# Patient Record
Sex: Female | Born: 1935 | Race: White | Hispanic: No | Marital: Married | Smoking: Never smoker
Health system: Southern US, Community
[De-identification: ages and names within clinical notes are randomized; demographics above are authoritative.]

## PROBLEM LIST (undated history)

## (undated) DIAGNOSIS — Z87442 Personal history of urinary calculi: Secondary | ICD-10-CM

## (undated) DIAGNOSIS — R600 Localized edema: Secondary | ICD-10-CM

## (undated) DIAGNOSIS — C9590 Leukemia, unspecified not having achieved remission: Secondary | ICD-10-CM

## (undated) DIAGNOSIS — E782 Mixed hyperlipidemia: Secondary | ICD-10-CM

## (undated) DIAGNOSIS — M1990 Unspecified osteoarthritis, unspecified site: Secondary | ICD-10-CM

## (undated) DIAGNOSIS — Z853 Personal history of malignant neoplasm of breast: Secondary | ICD-10-CM

## (undated) DIAGNOSIS — I10 Essential (primary) hypertension: Secondary | ICD-10-CM

## (undated) DIAGNOSIS — D469 Myelodysplastic syndrome, unspecified: Secondary | ICD-10-CM

## (undated) DIAGNOSIS — C50919 Malignant neoplasm of unspecified site of unspecified female breast: Secondary | ICD-10-CM

## (undated) DIAGNOSIS — D72829 Elevated white blood cell count, unspecified: Secondary | ICD-10-CM

## (undated) DIAGNOSIS — Z923 Personal history of irradiation: Secondary | ICD-10-CM

## (undated) DIAGNOSIS — N200 Calculus of kidney: Secondary | ICD-10-CM

## (undated) DIAGNOSIS — D649 Anemia, unspecified: Secondary | ICD-10-CM

## (undated) DIAGNOSIS — E559 Vitamin D deficiency, unspecified: Secondary | ICD-10-CM

## (undated) HISTORY — DX: Localized edema: R60.0

## (undated) HISTORY — DX: Personal history of urinary calculi: Z87.442

## (undated) HISTORY — DX: Essential (primary) hypertension: I10

## (undated) HISTORY — DX: Vitamin D deficiency, unspecified: E55.9

## (undated) HISTORY — DX: Malignant neoplasm of unspecified site of unspecified female breast: C50.919

## (undated) HISTORY — DX: Mixed hyperlipidemia: E78.2

## (undated) HISTORY — DX: Elevated white blood cell count, unspecified: D72.829

## (undated) HISTORY — PX: KIDNEY STONE SURGERY: SHX686

## (undated) HISTORY — DX: Personal history of malignant neoplasm of breast: Z85.3

---

## 2001-06-12 DIAGNOSIS — C50919 Malignant neoplasm of unspecified site of unspecified female breast: Secondary | ICD-10-CM

## 2001-06-12 DIAGNOSIS — Z923 Personal history of irradiation: Secondary | ICD-10-CM

## 2001-06-12 DIAGNOSIS — Z853 Personal history of malignant neoplasm of breast: Secondary | ICD-10-CM

## 2001-06-12 HISTORY — DX: Personal history of malignant neoplasm of breast: Z85.3

## 2001-06-12 HISTORY — PX: BREAST BIOPSY: SHX20

## 2001-06-12 HISTORY — PX: BREAST LUMPECTOMY: SHX2

## 2001-06-12 HISTORY — DX: Personal history of irradiation: Z92.3

## 2001-06-12 HISTORY — DX: Malignant neoplasm of unspecified site of unspecified female breast: C50.919

## 2008-12-06 DIAGNOSIS — N200 Calculus of kidney: Secondary | ICD-10-CM | POA: Insufficient documentation

## 2015-02-22 DIAGNOSIS — E782 Mixed hyperlipidemia: Secondary | ICD-10-CM | POA: Insufficient documentation

## 2015-02-22 DIAGNOSIS — Z87442 Personal history of urinary calculi: Secondary | ICD-10-CM | POA: Insufficient documentation

## 2015-02-22 DIAGNOSIS — I10 Essential (primary) hypertension: Secondary | ICD-10-CM | POA: Insufficient documentation

## 2015-02-22 DIAGNOSIS — E559 Vitamin D deficiency, unspecified: Secondary | ICD-10-CM | POA: Insufficient documentation

## 2015-02-22 DIAGNOSIS — Z853 Personal history of malignant neoplasm of breast: Secondary | ICD-10-CM | POA: Insufficient documentation

## 2015-11-29 ENCOUNTER — Other Ambulatory Visit: Payer: Self-pay | Admitting: Internal Medicine

## 2015-11-29 DIAGNOSIS — Z1239 Encounter for other screening for malignant neoplasm of breast: Secondary | ICD-10-CM

## 2015-12-07 ENCOUNTER — Inpatient Hospital Stay
Admission: RE | Admit: 2015-12-07 | Discharge: 2015-12-07 | Disposition: A | Discharge status: Home / Self Care | Payer: Self-pay | Source: Ambulatory Visit | Attending: *Deleted | Admitting: *Deleted

## 2015-12-07 ENCOUNTER — Other Ambulatory Visit: Payer: Self-pay | Admitting: *Deleted

## 2015-12-07 DIAGNOSIS — Z9289 Personal history of other medical treatment: Secondary | ICD-10-CM

## 2015-12-17 ENCOUNTER — Ambulatory Visit
Admission: RE | Admit: 2015-12-17 | Discharge: 2015-12-17 | Disposition: A | Discharge status: Home / Self Care | Payer: Medicare Other | Source: Ambulatory Visit | Attending: Internal Medicine | Admitting: Internal Medicine

## 2015-12-17 ENCOUNTER — Other Ambulatory Visit: Payer: Self-pay | Admitting: Internal Medicine

## 2015-12-17 DIAGNOSIS — Z1231 Encounter for screening mammogram for malignant neoplasm of breast: Secondary | ICD-10-CM | POA: Diagnosis present

## 2015-12-17 DIAGNOSIS — Z1239 Encounter for other screening for malignant neoplasm of breast: Secondary | ICD-10-CM

## 2016-02-22 ENCOUNTER — Inpatient Hospital Stay: Payer: Medicare Other | Attending: Internal Medicine | Admitting: Internal Medicine

## 2016-02-22 ENCOUNTER — Telehealth: Payer: Self-pay | Admitting: *Deleted

## 2016-02-22 ENCOUNTER — Inpatient Hospital Stay: Payer: Medicare Other

## 2016-02-22 ENCOUNTER — Encounter: Payer: Self-pay | Admitting: *Deleted

## 2016-02-22 ENCOUNTER — Other Ambulatory Visit: Payer: Self-pay | Admitting: *Deleted

## 2016-02-22 DIAGNOSIS — Z853 Personal history of malignant neoplasm of breast: Secondary | ICD-10-CM | POA: Diagnosis not present

## 2016-02-22 DIAGNOSIS — D72829 Elevated white blood cell count, unspecified: Secondary | ICD-10-CM | POA: Diagnosis not present

## 2016-02-22 DIAGNOSIS — R161 Splenomegaly, not elsewhere classified: Secondary | ICD-10-CM

## 2016-02-22 DIAGNOSIS — Z79899 Other long term (current) drug therapy: Secondary | ICD-10-CM | POA: Diagnosis not present

## 2016-02-22 DIAGNOSIS — F1010 Alcohol abuse, uncomplicated: Secondary | ICD-10-CM | POA: Diagnosis not present

## 2016-02-22 DIAGNOSIS — Z87442 Personal history of urinary calculi: Secondary | ICD-10-CM | POA: Diagnosis not present

## 2016-02-22 DIAGNOSIS — E782 Mixed hyperlipidemia: Secondary | ICD-10-CM | POA: Insufficient documentation

## 2016-02-22 DIAGNOSIS — E559 Vitamin D deficiency, unspecified: Secondary | ICD-10-CM | POA: Diagnosis not present

## 2016-02-22 DIAGNOSIS — I10 Essential (primary) hypertension: Secondary | ICD-10-CM | POA: Diagnosis not present

## 2016-02-22 DIAGNOSIS — Z923 Personal history of irradiation: Secondary | ICD-10-CM | POA: Diagnosis not present

## 2016-02-22 LAB — COMPREHENSIVE METABOLIC PANEL
ALT: 19 U/L (ref 14–54)
ANION GAP: 8 (ref 5–15)
AST: 28 U/L (ref 15–41)
Albumin: 4.5 g/dL (ref 3.5–5.0)
Alkaline Phosphatase: 56 U/L (ref 38–126)
BILIRUBIN TOTAL: 0.7 mg/dL (ref 0.3–1.2)
BUN: 16 mg/dL (ref 6–20)
CHLORIDE: 96 mmol/L — AB (ref 101–111)
CO2: 32 mmol/L (ref 22–32)
Calcium: 9.7 mg/dL (ref 8.9–10.3)
Creatinine, Ser: 0.68 mg/dL (ref 0.44–1.00)
Glucose, Bld: 108 mg/dL — ABNORMAL HIGH (ref 65–99)
POTASSIUM: 3.9 mmol/L (ref 3.5–5.1)
Sodium: 136 mmol/L (ref 135–145)
TOTAL PROTEIN: 7.3 g/dL (ref 6.5–8.1)

## 2016-02-22 LAB — CBC WITH DIFFERENTIAL/PLATELET
Basophils Absolute: 0.4 10*3/uL — ABNORMAL HIGH (ref 0–0.1)
Basophils Relative: 1 %
EOS ABS: 0.1 10*3/uL (ref 0–0.7)
HCT: 37.4 % (ref 35.0–47.0)
Hemoglobin: 11.9 g/dL — ABNORMAL LOW (ref 12.0–16.0)
LYMPHS ABS: 4.4 10*3/uL — AB (ref 1.0–3.6)
Lymphocytes Relative: 9 %
MCH: 28.1 pg (ref 26.0–34.0)
MCHC: 32 g/dL (ref 32.0–36.0)
MCV: 87.9 fL (ref 80.0–100.0)
MONO ABS: 1.7 10*3/uL — AB (ref 0.2–0.9)
Monocytes Relative: 4 %
Neutro Abs: 41.5 10*3/uL — ABNORMAL HIGH (ref 1.4–6.5)
Neutrophils Relative %: 86 %
PLATELETS: 222 10*3/uL (ref 150–440)
RBC: 4.25 MIL/uL (ref 3.80–5.20)
RDW: 16.7 % — AB (ref 11.5–14.5)
WBC: 48.1 10*3/uL — AB (ref 3.6–11.0)

## 2016-02-22 LAB — LACTATE DEHYDROGENASE: LDH: 251 U/L — AB (ref 98–192)

## 2016-02-22 NOTE — Assessment & Plan Note (Addendum)
Leukocytosis white count 45,000 per predominant neutrophils; pathologist review of smear shows no blasts. However shows immature cells myelocytes and promyelocytes. Mild anemia hemoglobin 11 platelets are normal.  As the patient is asymptomatic; I suspect chronic myeloid leukemia. Recommend checking BCR ABL by fish; LDH. I also recommend CBC CMP; peripheral blood flow cytometry. We will also get Limited ultrasound of the spleen.  # I also recommend a bone marrow biopsy under anesthesia/radiology.   # I recommend follow-up in approximately week or so to review the above blood work labs in the next treatment plan.   Thank you Dr.Miller  for allowing me to participate in the care of your pleasant patient. Please do not hesitate to contact me with questions or concerns in the interim.   Ph: [phone]/home; cell- [phone]- will call if any significant abnormal results.

## 2016-02-22 NOTE — Progress Notes (Signed)
Pt reports being anxious today and thus an elevated BP.  Pt reports on a normal basis BP is not elevated.  Pt reported that on her mid spine right in the middle she has a scab.Marland Kitchen Has been there for at least a year, has no itching, burning etc.

## 2016-02-22 NOTE — Progress Notes (Signed)
[facility] CONSULT NOTE  No care team member to display  CHIEF COMPLAINTS/PURPOSE OF CONSULTATION:  leucocytosis  # LEUCOCYTOSIS- 45/neutrophilia  # 2002 [[place]] BREAST CA s/p Lumpect RT; [? Stage I] no chemo s/p AI  No history exists.     HISTORY OF PRESENTING ILLNESS:  Kathryn Hahn 82 y.o.  female with remote history of breast cancer has been referred to [place] for further evaluation of leukocytosis.  Patient's denies any unusual shortness of breath or cough. She denies any weight loss. Denies any significant night sweats. Denies any fevers. She denies any lumps or bumps. Denies any early satiety.    ROS: A complete 10 point review of system is done which is negative except mentioned above in history of present illness  MEDICAL HISTORY:  Past Medical History:  Diagnosis Date  . Breast cancer ([place])   . Edema leg   . History of breast cancer 2003   post lumpectomy  . History of kidney stones   . Hyperlipemia, mixed   . Hypertension, essential   . Leukocytosis   . Vitamin D deficiency     SURGICAL HISTORY: Past Surgical History:  Procedure Laterality Date  . BREAST BIOPSY Right 2003   Postive for cancer aromadex also had radiation  . BREAST LUMPECTOMY Right     SOCIAL HISTORY: No smoking/alcohol; lives in [place] with family.  Social History   Social History  . Marital status: Married    Spouse name: N/A  . Number of children: N/A  . Years of education: N/A   Occupational History  . Not on file.   Social History Main Topics  . Smoking status: Never Smoker  . Smokeless tobacco: Never Used  . Alcohol use 1.8 oz/week    3 Glasses of wine per week  . Drug use: No  . Sexual activity: Not on file   Other Topics Concern  . Not on file   Social History Narrative  . No narrative on file    FAMILY HISTORY: Family History  Problem Relation Age of Onset  . Stroke Mother   . Hypertension Father   . Stroke Father     ALLERGIES:  is  allergic to terbinafine.  MEDICATIONS:  Current Outpatient Prescriptions  Medication Sig Dispense Refill  . atenolol (TENORMIN) 50 MG tablet Take 1 tablet by mouth 2 (two) times daily.    . Cholecalciferol (VITAMIN D) 2000 units tablet Take 1 tablet by mouth daily.    . Flaxseed, Linseed, (FLAXSEED OIL) 1000 MG CAPS Take 1 capsule by mouth daily.    Marland Kitchen losartan-hydrochlorothiazide (HYZAAR) 50-12.5 MG tablet Take 1 tablet by mouth daily.    . Magnesium 250 MG TABS Take 250 mg by mouth daily.    . Multiple Vitamin (MULTI-VITAMINS) TABS Take 1 tablet by mouth daily.    . Omega-3 Fatty Acids (FISH OIL PO) Take 1 capsule by mouth daily.     No current facility-administered medications for this visit.       Marland Kitchen  PHYSICAL EXAMINATION: ECOG PERFORMANCE STATUS: 0 - Asymptomatic  Vitals:   02/22/16 0958  BP: (!) 160/85  Pulse: (!) 59  Resp: 16  Temp: (!) 96 F (35.6 C)   Filed Weights   02/22/16 0958  Weight: 104 lb 11.5 oz (47.5 kg)    GENERAL: Well-nourished well-developed; Alert, no distress and comfortable.   Alone. EYES: no pallor or icterus OROPHARYNX: no thrush or ulceration; good dentition  NECK: supple, no masses felt LYMPH:  no  palpable lymphadenopathy in the cervical, axillary or inguinal regions LUNGS: clear to auscultation and  No wheeze or crackles HEART/CVS: regular rate & rhythm and no murmurs; No lower extremity edema ABDOMEN: abdomen soft, non-tender and normal bowel sounds; ? Splenomeglay.  Musculoskeletal:no cyanosis of digits and no clubbing  PSYCH: alert & oriented x 3 with fluent speech NEURO: no focal motor/sensory deficits SKIN:  no rashes or significant lesions  LABORATORY DATA:  I have reviewed the data as listed Lab Results  Component Value Date   WBC 48.1 (H) 02/22/2016   HGB 11.9 (L) 02/22/2016   HCT 37.4 02/22/2016   MCV 87.9 02/22/2016   PLT 222 02/22/2016    Recent Labs  02/22/16 1105  NA 136  K 3.9  CL 96*  CO2 32  GLUCOSE 108*   BUN 16  CREATININE 0.68  CALCIUM 9.7  GFRNONAA >60  GFRAA >60  PROT 7.3  ALBUMIN 4.5  AST 28  ALT 19  ALKPHOS 56  BILITOT 0.7    RADIOGRAPHIC STUDIES: I have personally reviewed the radiological images as listed and agreed with the findings in the report. No results found.  ASSESSMENT & PLAN:   Leucocytosis Leukocytosis white count 45,000 per predominant neutrophils; pathologist review of smear shows no blasts. However shows immature cells myelocytes and promyelocytes. Mild anemia hemoglobin 11 platelets are normal.  As the patient is asymptomatic; I suspect chronic myeloid leukemia. Recommend checking BCR ABL by fish; LDH. I also recommend CBC CMP; peripheral blood flow cytometry. We will also get Limited ultrasound of the spleen.  # I also recommend a bone marrow biopsy under anesthesia/radiology.   # I recommend follow-up in approximately week or so to review the above blood work labs in the next treatment plan.   Thank you Dr.Miller  for allowing me to participate in the care of your pleasant patient. Please do not hesitate to contact me with questions or concerns in the interim.   Ph: [phone]/home; cell- [phone]- will call if any significant abnormal results.  All questions were answered. The patient knows to call the clinic with any problems, questions or concerns.     Cammie Sickle, MD 02/22/2016 4:42 PM

## 2016-02-24 ENCOUNTER — Encounter: Payer: Self-pay | Admitting: Internal Medicine

## 2016-02-24 ENCOUNTER — Ambulatory Visit
Admission: RE | Admit: 2016-02-24 | Discharge: 2016-02-24 | Disposition: A | Discharge status: Home / Self Care | Payer: Medicare Other | Source: Ambulatory Visit | Attending: Internal Medicine | Admitting: Internal Medicine

## 2016-02-24 ENCOUNTER — Ambulatory Visit: Payer: Medicare Other

## 2016-02-24 DIAGNOSIS — Z79899 Other long term (current) drug therapy: Secondary | ICD-10-CM | POA: Insufficient documentation

## 2016-02-24 DIAGNOSIS — Z823 Family history of stroke: Secondary | ICD-10-CM | POA: Insufficient documentation

## 2016-02-24 DIAGNOSIS — E559 Vitamin D deficiency, unspecified: Secondary | ICD-10-CM | POA: Diagnosis not present

## 2016-02-24 DIAGNOSIS — Z8249 Family history of ischemic heart disease and other diseases of the circulatory system: Secondary | ICD-10-CM | POA: Diagnosis not present

## 2016-02-24 DIAGNOSIS — E782 Mixed hyperlipidemia: Secondary | ICD-10-CM | POA: Diagnosis not present

## 2016-02-24 DIAGNOSIS — I10 Essential (primary) hypertension: Secondary | ICD-10-CM | POA: Insufficient documentation

## 2016-02-24 DIAGNOSIS — Z888 Allergy status to other drugs, medicaments and biological substances status: Secondary | ICD-10-CM | POA: Insufficient documentation

## 2016-02-24 DIAGNOSIS — Z853 Personal history of malignant neoplasm of breast: Secondary | ICD-10-CM | POA: Insufficient documentation

## 2016-02-24 DIAGNOSIS — D72829 Elevated white blood cell count, unspecified: Secondary | ICD-10-CM | POA: Insufficient documentation

## 2016-02-24 HISTORY — DX: Unspecified osteoarthritis, unspecified site: M19.90

## 2016-02-24 HISTORY — DX: Anemia, unspecified: D64.9

## 2016-02-24 HISTORY — DX: Calculus of kidney: N20.0

## 2016-02-24 LAB — CBC
HCT: 35.2 % (ref 35.0–47.0)
HEMOGLOBIN: 12 g/dL (ref 12.0–16.0)
MCH: 29.6 pg (ref 26.0–34.0)
MCHC: 34.2 g/dL (ref 32.0–36.0)
MCV: 86.7 fL (ref 80.0–100.0)
PLATELETS: 214 10*3/uL (ref 150–440)
RBC: 4.05 MIL/uL (ref 3.80–5.20)
RDW: 16.7 % — ABNORMAL HIGH (ref 11.5–14.5)
WBC: 46.8 10*3/uL — AB (ref 3.6–11.0)

## 2016-02-24 LAB — DIFFERENTIAL
Basophils Absolute: 0.3 10*3/uL — ABNORMAL HIGH (ref 0–0.1)
Basophils Relative: 1 %
Eosinophils Absolute: 0.1 10*3/uL (ref 0–0.7)
Eosinophils Relative: 0 %
LYMPHS ABS: 4.1 10*3/uL — AB (ref 1.0–3.6)
Monocytes Absolute: 1.5 10*3/uL — ABNORMAL HIGH (ref 0.2–0.9)
Monocytes Relative: 3 %
NEUTROS ABS: 40.8 10*3/uL — AB (ref 1.4–6.5)

## 2016-02-24 LAB — COMP PANEL: LEUKEMIA/LYMPHOMA

## 2016-02-24 MED ORDER — FENTANYL CITRATE (PF) 100 MCG/2ML IJ SOLN
INTRAMUSCULAR | Status: AC | PRN
  Administered 2016-02-24: 50 ug via INTRAVENOUS

## 2016-02-24 MED ORDER — SODIUM CHLORIDE 0.9 % IV SOLN
INTRAVENOUS | Status: DC
  Administered 2016-02-24: 09:00:00 via INTRAVENOUS

## 2016-02-24 MED ORDER — MIDAZOLAM HCL 5 MG/5ML IJ SOLN
INTRAMUSCULAR | Status: AC | PRN
  Administered 2016-02-24 (×2): 1 mg via INTRAVENOUS

## 2016-02-24 NOTE — Consult Note (Signed)
Chief Complaint: Patient was seen in consultation today for leukocytosis at the request of Brahmanday,Govinda R  Referring Physician(s): Brahmanday,Govinda R  Patient Status: Outpatient  History of Present Illness: Kathryn Hahn is a 82 y.o. female with a remote history of breast cancer and persistent leukocytosis.  Her WBC is 45,000 and predominantly neutrophils.  She denies symptoms of febrile illness or toxicity.  She has seen Dr. Rogue Bussing [facility] who is concerned about CML.  She presents today for bone marrow bx for CT guidance.   No active complaints this morning.   Past Medical History:  Diagnosis Date  . Anemia   . Arthritis   . Breast cancer ([place])   . Edema leg   . History of breast cancer 2003   post lumpectomy  . History of kidney stones   . Hyperlipemia, mixed   . Hypertension, essential   . Leukocytosis   . Renal stones   . Vitamin D deficiency     Past Surgical History:  Procedure Laterality Date  . BREAST BIOPSY Right 2003   Postive for cancer aromadex also had radiation  . BREAST LUMPECTOMY Right   . KIDNEY STONE SURGERY Right     Allergies: Terbinafine  Medications: Prior to Admission medications   Medication Sig Start Date End Date Taking? Authorizing Provider  atenolol (TENORMIN) 50 MG tablet Take 1 tablet by mouth 2 (two) times daily.   Yes Historical Provider, MD  Cholecalciferol (VITAMIN D) 2000 units tablet Take 1 tablet by mouth daily.   Yes Historical Provider, MD  Flaxseed, Linseed, (FLAXSEED OIL) 1000 MG CAPS Take 1 capsule by mouth daily.   Yes Historical Provider, MD  losartan-hydrochlorothiazide (HYZAAR) 50-12.5 MG tablet Take 1 tablet by mouth daily.   Yes Historical Provider, MD  Magnesium 250 MG TABS Take 250 mg by mouth daily.   Yes Historical Provider, MD  Multiple Vitamin (MULTI-VITAMINS) TABS Take 1 tablet by mouth daily.   Yes Historical Provider, MD  Omega-3 Fatty Acids (FISH OIL PO) Take 1 capsule by mouth daily.    Yes Historical Provider, MD     Family History  Problem Relation Age of Onset  . Stroke Mother   . Hypertension Father   . Stroke Father     Social History   Social History  . Marital status: Married    Spouse name: N/A  . Number of children: N/A  . Years of education: N/A   Social History Main Topics  . Smoking status: Never Smoker  . Smokeless tobacco: Never Used  . Alcohol use 1.8 oz/week    3 Glasses of wine per week  . Drug use: No  . Sexual activity: Not Asked   Other Topics Concern  . None   Social History Narrative  . None    ECOG Status: 0 - Asymptomatic  Review of Systems: A 12 point ROS discussed and pertinent positives are indicated in the HPI above.  All other systems are negative.  Review of Systems  Vital Signs: BP (!) 143/77   Temp 97.7 F (36.5 C)   Resp 15   SpO2 95%   Physical Exam  Constitutional: She is oriented to person, place, and time. She appears well-developed and well-nourished. No distress.  HENT:  Head: Normocephalic and atraumatic.  Eyes: No scleral icterus.  Cardiovascular: Normal rate, regular rhythm and normal heart sounds.   Pulmonary/Chest: Effort normal and breath sounds normal.  Neurological: She is alert and oriented to person, place, and time.  Skin: Skin is warm and dry.  Vitals reviewed.   Mallampati Score:  MD Evaluation Airway: WNL Heart: WNL Abdomen: WNL Chest/ Lungs: WNL ASA  Classification: 2 Mallampati/Airway Score: One  Imaging: No results found.  Labs:  CBC:  Recent Labs  02/22/16 1105  WBC 48.1*  HGB 11.9*  HCT 37.4  PLT 222    COAGS: No results for input(s): INR, APTT in the last 8760 hours.  BMP:  Recent Labs  02/22/16 1105  NA 136  K 3.9  CL 96*  CO2 32  GLUCOSE 108*  BUN 16  CALCIUM 9.7  CREATININE 0.68  GFRNONAA >60  GFRAA >60    LIVER FUNCTION TESTS:  Recent Labs  02/22/16 1105  BILITOT 0.7  AST 28  ALT 19  ALKPHOS 56  PROT 7.3  ALBUMIN 4.5     TUMOR MARKERS: No results for input(s): AFPTM, CEA, CA199, CHROMGRNA in the last 8760 hours.  Assessment and Plan:  82 yo female with leuokocytosis concerning for CML.   1.) bone marrow bx under CT guidance.  Will send for BCR ABL.   Thank you for this interesting consult.  I greatly enjoyed meeting Kathryn Hahn and look forward to participating in their care.  A copy of this report was sent to the requesting provider on this date.  Electronically Signed: Jacqulynn Cadet 02/24/2016, 8:50 AM   I spent a total of 15 Minutes in face to face in clinical consultation, greater than 50% of which was counseling/coordinating care for leukocytosis.

## 2016-02-24 NOTE — Procedures (Signed)
Interventional Radiology Procedure Note  Procedure: CT guided bone marrow bx  Complications: None  Estimated Blood Loss:  0  Recommendations:  - Bedrest x 1 hr - DC home  Signed,  Criselda Peaches, MD

## 2016-02-28 LAB — BCR-ABL1 FISH
CELLS ANALYZED: 200
Cells Counted: 200

## 2016-02-29 ENCOUNTER — Ambulatory Visit
Admission: RE | Admit: 2016-02-29 | Discharge: 2016-02-29 | Disposition: A | Discharge status: Home / Self Care | Payer: Medicare Other | Source: Ambulatory Visit | Attending: Internal Medicine | Admitting: Internal Medicine

## 2016-02-29 DIAGNOSIS — D72829 Elevated white blood cell count, unspecified: Secondary | ICD-10-CM | POA: Diagnosis not present

## 2016-02-29 DIAGNOSIS — R161 Splenomegaly, not elsewhere classified: Secondary | ICD-10-CM

## 2016-03-01 ENCOUNTER — Telehealth: Payer: Self-pay | Admitting: Internal Medicine

## 2016-03-01 NOTE — Telephone Encounter (Signed)
Left message for [facility] to discuss the case. Dr.B

## 2016-03-02 ENCOUNTER — Inpatient Hospital Stay (HOSPITAL_BASED_OUTPATIENT_CLINIC_OR_DEPARTMENT_OTHER): Payer: Medicare Other | Admitting: Internal Medicine

## 2016-03-02 DIAGNOSIS — Z923 Personal history of irradiation: Secondary | ICD-10-CM

## 2016-03-02 DIAGNOSIS — D471 Chronic myeloproliferative disease: Secondary | ICD-10-CM | POA: Insufficient documentation

## 2016-03-02 DIAGNOSIS — Z79899 Other long term (current) drug therapy: Secondary | ICD-10-CM

## 2016-03-02 DIAGNOSIS — D72829 Elevated white blood cell count, unspecified: Secondary | ICD-10-CM | POA: Diagnosis not present

## 2016-03-02 DIAGNOSIS — Z853 Personal history of malignant neoplasm of breast: Secondary | ICD-10-CM

## 2016-03-02 NOTE — Progress Notes (Signed)
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 [place] limited- mild splenomegaly [~10cm; 463cm3]  # 2002 [[place]] BREAST CA s/p Lumpect RT; [? Stage I] no chemo s/p AI  No history exists.     HISTORY OF PRESENTING ILLNESS:  Kathryn Hahn 82 y.o.  female with Leukocytosis predominant neutrophilia is here to review the results of her bone marrow biopsy.  Bone marrow biopsy was uneventful.  Patient's denies any unusual shortness of breath or cough. She denies any weight loss. Denies any significant night sweats. Denies any fevers. She denies any lumps or bumps. Denies any early satiety.    ROS: A complete 10 point review of system is done which is negative except mentioned above in history of present illness  MEDICAL HISTORY:  Past Medical History:  Diagnosis Date  . Anemia   . Arthritis   . Breast cancer ([place])   . Edema leg   . History of breast cancer 2003   post lumpectomy  . History of kidney stones   . Hyperlipemia, mixed   . Hypertension, essential   . Leukocytosis   . Renal stones   . Vitamin D deficiency     SURGICAL HISTORY: Past Surgical History:  Procedure Laterality Date  . BREAST BIOPSY Right 2003   Postive for cancer aromadex also had radiation  . BREAST LUMPECTOMY Right   . KIDNEY STONE SURGERY Right     SOCIAL HISTORY: No smoking/alcohol; lives in [place] with family.  Social History   Social History  . Marital status: Married    Spouse name: N/A  . Number of children: N/A  . Years of education: N/A    Occupational History  . Not on file.   Social History Main Topics  . Smoking status: Never Smoker  . Smokeless tobacco: Never Used  . Alcohol use 1.8 oz/week    3 Glasses of wine per week  . Drug use: No  . Sexual activity: Not on file   Other Topics Concern  . Not on file   Social History Narrative  . No narrative on file    FAMILY HISTORY: Family History  Problem Relation Age of Onset  . Stroke Mother   . Hypertension Father   . Stroke Father     ALLERGIES:  is allergic to terbinafine.  MEDICATIONS:  Current Outpatient Prescriptions  Medication Sig Dispense Refill  . atenolol (TENORMIN) 50 MG tablet Take 1 tablet by mouth 2 (two) times daily.    . Cholecalciferol (VITAMIN D) 2000 units tablet Take 1 tablet by mouth daily.    . Flaxseed, Linseed, (FLAXSEED OIL) 1000 MG CAPS Take 1 capsule by mouth daily.    Marland Kitchen losartan-hydrochlorothiazide (HYZAAR) 50-12.5 MG tablet Take 1 tablet by mouth daily.    . Magnesium 250 MG TABS Take 250 mg by mouth daily.    . Multiple Vitamin (MULTI-VITAMINS) TABS Take 1 tablet by mouth daily.    . Omega-3 Fatty Acids (FISH OIL PO) Take 1 capsule by mouth daily.     No current facility-administered medications for this visit.       Marland Kitchen  PHYSICAL EXAMINATION: ECOG PERFORMANCE STATUS: 0 - Asymptomatic  Vitals:   03/02/16 1019  BP: (!) 144/81  Pulse: (!) 59  Resp: 18  Temp: 97.8 F (36.6 C)   Filed Weights   03/02/16 1019  Weight: 104 lb (47.2 kg)    GENERAL: Well-nourished well-developed; Alert, no distress and comfortable.   With her husband. Marland Kitchen EYES: no pallor or icterus OROPHARYNX: no thrush or ulceration; good dentition  NECK: supple, no masses felt LYMPH:  no palpable lymphadenopathy in the cervical, axillary or inguinal regions LUNGS: clear to auscultation and  No wheeze or crackles HEART/CVS: regular rate & rhythm and no murmurs; No lower extremity edema ABDOMEN: abdomen soft, non-tender and normal bowel sounds; ?  Splenomeglay.  Musculoskeletal:no cyanosis of digits and no clubbing  PSYCH: alert & oriented x 3 with fluent speech NEURO: no focal motor/sensory deficits SKIN:  no rashes or significant lesions  LABORATORY DATA:  I have reviewed the data as listed Lab Results  Component Value Date   WBC 46.8 (H) 02/24/2016   HGB 12.0 02/24/2016   HCT 35.2 02/24/2016   MCV 86.7 02/24/2016   PLT 214 02/24/2016    Recent Labs  02/22/16 1105  NA 136  K 3.9  CL 96*  CO2 32  GLUCOSE 108*  BUN 16  CREATININE 0.68  CALCIUM 9.7  GFRNONAA >60  GFRAA >60  PROT 7.3  ALBUMIN 4.5  AST 28  ALT 19  ALKPHOS 56  BILITOT 0.7    RADIOGRAPHIC STUDIES: I have personally reviewed the radiological images as listed and agreed with the findings in the report. US Abdomen Limited  Result Date: 02/29/2016 CLINICAL DATA:  Leukocytosis. Assess splenic volume. Concern for chronic myelogenous leukemia. EXAM: LIMITED ABDOMINAL ULTRASOUND COMPARISON:  None. FINDINGS: The splenic volume is increased, measuring 10.7 x 7.6 x 10.8 cm correlating with a volume of 463 cubic cm. No focal lesions. IMPRESSION: Splenomegaly as described. Electronically Signed   By: Staci Righter M.D.   On: 02/29/2016 10:27   Ct Biopsy  Result Date: 02/24/2016 INDICATION: 82 year old female with leukocytosis concerning for chronic myelogenous leukemia. She presents for CT-guided bone marrow biopsy. EXAM: CT GUIDED BONE MARROW ASPIRATION AND CORE BIOPSY Interventional Radiologist:  Criselda Peaches, MD MEDICATIONS: None. ANESTHESIA/SEDATION: Moderate (conscious) sedation was employed during this procedure. A total of 2 milligrams versed and 50 micrograms fentanyl were administered intravenously. The patient's level of consciousness and vital signs were monitored continuously by radiology nursing throughout the procedure under my direct supervision. Total monitored sedation time: 14 minutes FLUOROSCOPY TIME:  Fluoroscopy Time: 0 minutes 0 seconds  (0 mGy). COMPLICATIONS: None immediate. Estimated blood loss: <25 mL PROCEDURE: Informed written consent was obtained from the patient after a thorough discussion of the procedural risks, benefits and alternatives. All questions were addressed. Maximal Sterile Barrier Technique was utilized including caps, mask, sterile gowns, sterile gloves, sterile drape, hand hygiene and skin antiseptic. A timeout was performed prior to the initiation of the procedure. The patient was positioned prone and non-contrast localization CT was performed of the pelvis to demonstrate the iliac marrow spaces. Maximal barrier sterile technique utilized including caps, mask, sterile gowns, sterile gloves, large sterile drape, hand hygiene, and betadine prep. Under sterile conditions and local anesthesia, an 11 gauge coaxial bone biopsy needle was advanced into the right iliac marrow space. Needle position was confirmed with CT imaging. Initially, bone marrow aspiration was performed. Next, the 11 gauge outer cannula was utilized to obtain a right iliac bone marrow core biopsy. Needle was removed. Hemostasis was obtained with  compression. The patient tolerated the procedure well. Samples were prepared with the cytotechnologist. IMPRESSION: Technically successful right bone marrow biopsy under CT guidance. Electronically Signed   By: Jacqulynn Cadet M.D.   On: 02/24/2016 10:46    ASSESSMENT & PLAN:   Myeloproliferative neoplasm (HCC) Myeloproliferative neoplasm-leukocytosis; predominant neutrophilia no blasts. BCR ABL peripheral blood negative by fish. Cytogenetics-pending. I left a message for Dr[facility]Uherova at [facility]. ? CSFR-3 testing vs others?.   # Discussed with the patient that since no blasts noted- not concerning for acute leukemia. Again above workup is still pending.  # Recommend follow-up in approximately 4 weeks/no labs.   All questions were answered. The patient knows to call the clinic with any problems,  questions or concerns.     Cammie Sickle, MD 03/03/2016 7:20 AM

## 2016-03-03 NOTE — Assessment & Plan Note (Signed)
Myeloproliferative neoplasm-leukocytosis; predominant neutrophilia no blasts. BCR ABL peripheral blood negative by fish. Cytogenetics-pending. I left a message for Dr.Uherova at [facility]. ? CSFR-3 testing vs others?.   # Discussed with the patient that since no blasts noted- not concerning for acute leukemia. Again above workup is still pending.  # Recommend follow-up in approximately 4 weeks/no labs.

## 2016-03-16 ENCOUNTER — Telehealth: Payer: Self-pay | Admitting: *Deleted

## 2016-03-16 NOTE — Telephone Encounter (Signed)
Team acknowledge msg.

## 2016-03-16 NOTE — Telephone Encounter (Signed)
-----   Message from Wallene Dales sent at 03/16/2016  9:55 AM EDT ----- Regarding: Transferring Care Pt canceled appts and is transferring care to [facility].

## 2016-03-24 ENCOUNTER — Ambulatory Visit: Payer: Medicare Other | Admitting: Internal Medicine

## 2016-04-21 ENCOUNTER — Inpatient Hospital Stay: Payer: Medicare Other | Attending: Internal Medicine | Admitting: Internal Medicine

## 2016-04-21 ENCOUNTER — Encounter (INDEPENDENT_AMBULATORY_CARE_PROVIDER_SITE_OTHER): Payer: Self-pay

## 2016-04-21 VITALS — BP 171/78 | HR 60 | Temp 97.5°F | Resp 18 | Wt 103.2 lb

## 2016-04-21 DIAGNOSIS — D471 Chronic myeloproliferative disease: Secondary | ICD-10-CM | POA: Insufficient documentation

## 2016-04-21 DIAGNOSIS — M1990 Unspecified osteoarthritis, unspecified site: Secondary | ICD-10-CM | POA: Insufficient documentation

## 2016-04-21 DIAGNOSIS — D72829 Elevated white blood cell count, unspecified: Secondary | ICD-10-CM | POA: Insufficient documentation

## 2016-04-21 DIAGNOSIS — Z79899 Other long term (current) drug therapy: Secondary | ICD-10-CM | POA: Insufficient documentation

## 2016-04-21 DIAGNOSIS — E559 Vitamin D deficiency, unspecified: Secondary | ICD-10-CM | POA: Diagnosis not present

## 2016-04-21 DIAGNOSIS — E782 Mixed hyperlipidemia: Secondary | ICD-10-CM | POA: Insufficient documentation

## 2016-04-21 DIAGNOSIS — Z853 Personal history of malignant neoplasm of breast: Secondary | ICD-10-CM

## 2016-04-21 DIAGNOSIS — I10 Essential (primary) hypertension: Secondary | ICD-10-CM | POA: Insufficient documentation

## 2016-04-21 NOTE — Progress Notes (Signed)
Patient is here for follow u, she is doing well no complaints.

## 2016-04-21 NOTE — Assessment & Plan Note (Signed)
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 Patient continues to be asymptomatic.   # I also spoke to Dr. Brooke Dare- at [facility] who agrees with surveillance at this time. Patient could be a candidate for Hydrea if she was to have increasing leukocytosis or become symptomatic with weight loss or night sweats or worsening splenomegaly. She might also need a bone marrow biopsy if her counts rapidly start increasing.   # The above plan of care was discussed with patient and she agrees. She'll follow-up with me in approximately 3 months CBC CMP and LDH.

## 2016-04-21 NOTE — Progress Notes (Signed)
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 NEG- Jak-2/MPL/CALR [place] limited- mild splenomegaly [~10cm; 463cm3]; OCT 2017- second opinion at [place]. Surveillance.   # 2002 [[place]] BREAST CA s/p Lumpect RT; [? Stage I] no chemo s/p AI  No history exists.     HISTORY OF PRESENTING ILLNESS:  Kathryn Hahn 82 y.o.  female with Leukocytosis predominant neutrophilia is here For follow-up.  In the interim patient had been evaluated at [facility] for a second opinion- by Dr.Rizzeiri.   Patient's denies any unusual shortness of breath or cough. She denies any weight loss. Denies any significant night sweats. Denies any fevers. She denies any lumps or bumps. Denies any early satiety.    ROS: A complete 10 point review of system is done which is negative except mentioned above in history of present illness  MEDICAL HISTORY:  Past Medical History:  Diagnosis Date  . Anemia   . Arthritis   . Breast cancer ([place])   . Edema leg   . History of breast cancer 2003   post lumpectomy  . History of kidney stones   . Hyperlipemia, mixed   . Hypertension, essential   . Leukocytosis   . Renal stones   . Vitamin D deficiency     SURGICAL HISTORY: Past Surgical History:  Procedure Laterality Date  . BREAST BIOPSY Right 2003   Postive for cancer aromadex also had radiation  . BREAST LUMPECTOMY Right   . KIDNEY STONE SURGERY Right     SOCIAL HISTORY: No smoking/alcohol; lives in [place] with family.  Social History   Social History  . Marital status: Married     Spouse name: N/A  . Number of children: N/A  . Years of education: N/A   Occupational History  . Not on file.   Social History Main Topics  . Smoking status: Never Smoker  . Smokeless tobacco: Never Used  . Alcohol use 1.8 oz/week    3 Glasses of wine per week  . Drug use: No  . Sexual activity: Not on file   Other Topics Concern  . Not on file   Social History Narrative  . No narrative on file    FAMILY HISTORY: Family History  Problem Relation Age of Onset  . Stroke Mother   . Hypertension Father   . Stroke Father     ALLERGIES:  is allergic to terbinafine.  MEDICATIONS:  Current Outpatient Prescriptions  Medication Sig Dispense Refill  . atenolol (TENORMIN) 50 MG tablet Take 1 tablet by mouth 2 (two) times daily.    . Cholecalciferol (VITAMIN D) 2000 units tablet Take 1 tablet by mouth daily.    . Flaxseed, Linseed, (FLAXSEED OIL) 1000 MG CAPS Take 1 capsule by mouth daily.    Marland Kitchen losartan-hydrochlorothiazide (HYZAAR) 50-12.5 MG tablet Take 1 tablet by mouth daily.    . Magnesium 250 MG TABS Take 250 mg by mouth daily.    . Multiple Vitamin (MULTI-VITAMINS) TABS Take 1 tablet by mouth daily.    . Omega-3 Fatty Acids (FISH OIL PO) Take 1 capsule by mouth daily.     No current facility-administered medications for this visit.       Marland Kitchen  PHYSICAL EXAMINATION: ECOG PERFORMANCE STATUS: 0 - Asymptomatic  Vitals:   04/21/16 1547  BP: (!) 171/78  Pulse: 60  Resp: 18  Temp: 97.5 F (36.4 C)   Filed Weights   04/21/16 1547  Weight: 103 lb 3.2 oz (46.8 kg)    GENERAL: Well-nourished well-developed; Alert, no distress and comfortable.  She is alone.  EYES: no pallor or icterus OROPHARYNX: no thrush or ulceration; good dentition  NECK: supple, no masses felt LYMPH:  no palpable lymphadenopathy in the cervical, axillary or inguinal regions LUNGS: clear to auscultation and  No wheeze or crackles HEART/CVS: regular rate & rhythm and no murmurs; No lower  extremity edema ABDOMEN: abdomen soft, non-tender and normal bowel sounds; mild Splenomeglay.  Musculoskeletal:no cyanosis of digits and no clubbing  PSYCH: alert & oriented x 3 with fluent speech NEURO: no focal motor/sensory deficits SKIN:  no rashes or significant lesions  LABORATORY DATA:  I have reviewed the data as listed Lab Results  Component Value Date   WBC 46.8 (H) 02/24/2016   HGB 12.0 02/24/2016   HCT 35.2 02/24/2016   MCV 86.7 02/24/2016   PLT 214 02/24/2016    Recent Labs  02/22/16 1105  NA 136  K 3.9  CL 96*  CO2 32  GLUCOSE 108*  BUN 16  CREATININE 0.68  CALCIUM 9.7  GFRNONAA >60  GFRAA >60  PROT 7.3  ALBUMIN 4.5  AST 28  ALT 19  ALKPHOS 56  BILITOT 0.7    RADIOGRAPHIC STUDIES: I have personally reviewed the radiological images as listed and agreed with the findings in the report. No results found.  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 Patient continues to be asymptomatic.   # I also spoke to Dr. Brooke Dare- at [facility] who agrees with surveillance at this time. Patient could be a candidate for Hydrea if she was to have increasing leukocytosis or become symptomatic with weight loss or night sweats or worsening splenomegaly. She might also need a bone marrow biopsy if her counts rapidly start increasing.   # The above plan of care was discussed with patient and she agrees. She'll follow-up with me in approximately 3 months CBC CMP and LDH.   # 25 minutes face-to-face with the patient discussing the above plan of care; more than 50% of time spent on prognosis/ natural history; counseling and coordination.     Cammie Sickle, MD 04/21/2016 4:25 PM

## 2016-04-26 ENCOUNTER — Encounter: Payer: Self-pay | Admitting: Internal Medicine

## 2016-07-17 DIAGNOSIS — D471 Chronic myeloproliferative disease: Secondary | ICD-10-CM | POA: Insufficient documentation

## 2016-07-17 DIAGNOSIS — R7989 Other specified abnormal findings of blood chemistry: Secondary | ICD-10-CM | POA: Insufficient documentation

## 2016-07-21 ENCOUNTER — Inpatient Hospital Stay: Payer: Medicare Other | Attending: Internal Medicine | Admitting: Internal Medicine

## 2016-07-21 ENCOUNTER — Inpatient Hospital Stay: Payer: Medicare Other

## 2016-07-21 DIAGNOSIS — M1990 Unspecified osteoarthritis, unspecified site: Secondary | ICD-10-CM | POA: Insufficient documentation

## 2016-07-21 DIAGNOSIS — D471 Chronic myeloproliferative disease: Secondary | ICD-10-CM

## 2016-07-21 DIAGNOSIS — E559 Vitamin D deficiency, unspecified: Secondary | ICD-10-CM | POA: Diagnosis not present

## 2016-07-21 DIAGNOSIS — I10 Essential (primary) hypertension: Secondary | ICD-10-CM | POA: Insufficient documentation

## 2016-07-21 DIAGNOSIS — Z853 Personal history of malignant neoplasm of breast: Secondary | ICD-10-CM | POA: Diagnosis not present

## 2016-07-21 DIAGNOSIS — Z923 Personal history of irradiation: Secondary | ICD-10-CM | POA: Diagnosis not present

## 2016-07-21 DIAGNOSIS — Z79899 Other long term (current) drug therapy: Secondary | ICD-10-CM | POA: Diagnosis not present

## 2016-07-21 DIAGNOSIS — E782 Mixed hyperlipidemia: Secondary | ICD-10-CM | POA: Diagnosis not present

## 2016-07-21 DIAGNOSIS — D72829 Elevated white blood cell count, unspecified: Secondary | ICD-10-CM | POA: Diagnosis not present

## 2016-07-21 LAB — CBC WITH DIFFERENTIAL/PLATELET
Basophils Absolute: 0.3 10*3/uL — ABNORMAL HIGH (ref 0–0.1)
Basophils Relative: 1 %
Eosinophils Absolute: 0.3 10*3/uL (ref 0–0.7)
Eosinophils Relative: 0 %
HCT: 33.4 % — ABNORMAL LOW (ref 35.0–47.0)
HEMOGLOBIN: 11 g/dL — AB (ref 12.0–16.0)
LYMPHS ABS: 4.7 10*3/uL — AB (ref 1.0–3.6)
Lymphocytes Relative: 7 %
MCH: 28.5 pg (ref 26.0–34.0)
MCHC: 32.9 g/dL (ref 32.0–36.0)
MCV: 86.6 fL (ref 80.0–100.0)
MONOS PCT: 2 %
Monocytes Absolute: 1.2 10*3/uL — ABNORMAL HIGH (ref 0.2–0.9)
NEUTROS ABS: 61.8 10*3/uL — AB (ref 1.4–6.5)
NEUTROS PCT: 90 %
Platelets: 183 10*3/uL (ref 150–440)
RBC: 3.86 MIL/uL (ref 3.80–5.20)
RDW: 17.9 % — ABNORMAL HIGH (ref 11.5–14.5)
WBC: 68.3 10*3/uL — AB (ref 3.6–11.0)

## 2016-07-21 LAB — COMPREHENSIVE METABOLIC PANEL
ALK PHOS: 51 U/L (ref 38–126)
ALT: 19 U/L (ref 14–54)
AST: 27 U/L (ref 15–41)
Albumin: 4.3 g/dL (ref 3.5–5.0)
Anion gap: 7 (ref 5–15)
BUN: 18 mg/dL (ref 6–20)
CALCIUM: 9.9 mg/dL (ref 8.9–10.3)
CHLORIDE: 99 mmol/L — AB (ref 101–111)
CO2: 31 mmol/L (ref 22–32)
CREATININE: 0.66 mg/dL (ref 0.44–1.00)
GFR calc Af Amer: >60 mL/min (ref >60)
Glucose, Bld: 120 mg/dL — ABNORMAL HIGH (ref 65–99)
Potassium: 3.3 mmol/L — ABNORMAL LOW (ref 3.5–5.1)
SODIUM: 137 mmol/L (ref 135–145)
Total Bilirubin: 0.7 mg/dL (ref 0.3–1.2)
Total Protein: 7.2 g/dL (ref 6.5–8.1)

## 2016-07-21 LAB — LACTATE DEHYDROGENASE: LDH: 267 U/L — ABNORMAL HIGH (ref 98–192)

## 2016-07-21 NOTE — Assessment & Plan Note (Addendum)
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 Patient continues to be asymptomatic. However today white count is elevated to 68 from a baseline of 46; otherwise hemoglobin is stable/platelets are normal.  # I discussed the above findings with the patient and husband in detail. However as she continues to be asymptomatic at this time I would recommend checking monthly blood counts; and also peripheral blood flow cytometry to next blood draw/month. Discussed that if the counts continue to rise/ and or if patient starts to get symptomatic- I would recommend treatment with Hydrea. Also the counts started to keep going up- I would recommend repeating an ultrasound of the spleen.  # The above plan of care was discussed with patient and she agrees. She'll follow-up with me in approximately 3 months CBC CMP and LDH. Monthly CBC.

## 2016-07-21 NOTE — Progress Notes (Signed)
Patient here for follow-up h/o Myeloproliferative neoplasm disease. She has no medical complaints today.

## 2016-07-21 NOTE — Progress Notes (Signed)
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 NEG- Jak-2/MPL/CALR [place] limited- mild splenomegaly [~10cm; 463cm3]; OCT 2017- second opinion at [place]. Surveillance.   # 2002 [[place]] BREAST CA s/p Lumpect RT; [? Stage I] no chemo s/p AI  No history exists.     HISTORY OF PRESENTING ILLNESS:  Kathryn Hahn 83 y.o.  female with Myeloproliferative neoplasm/atypical CML/neutrophilia is here for follow-up.  Patient's denies any unusual shortness of breath or cough. She denies any weight loss. Denies any significant night sweats. Denies any fevers. She denies any lumps or bumps. Denies any early satiety.  She is actually planning to participate in a marathon in [place] in April 2018.  ROS: A complete 10 point review of system is done which is negative except mentioned above in history of present illness  MEDICAL HISTORY:  Past Medical History:  Diagnosis Date  . Anemia   . Arthritis   . Breast cancer ([place])   . Edema leg   . History of breast cancer 2003   post lumpectomy  . History of kidney stones   . Hyperlipemia, mixed   . Hypertension, essential   . Leukocytosis   . Renal stones   . Vitamin D deficiency     SURGICAL HISTORY: Past Surgical History:  Procedure Laterality Date  . BREAST BIOPSY Right 2003   Postive for cancer aromadex also had radiation  . BREAST LUMPECTOMY Right   . KIDNEY STONE SURGERY Right     SOCIAL HISTORY: No smoking/alcohol; lives in [place] with family.  Social History   Social History  . Marital status: Married     Spouse name: N/A  . Number of children: N/A  . Years of education: N/A   Occupational History  . Not on file.   Social History Main Topics  . Smoking status: Never Smoker  . Smokeless tobacco: Never Used  . Alcohol use 1.8 oz/week    3 Glasses of wine per week  . Drug use: No  . Sexual activity: Not on file   Other Topics Concern  . Not on file   Social History Narrative  . No narrative on file    FAMILY HISTORY: Family History  Problem Relation Age of Onset  . Stroke Mother   . Hypertension Father   . Stroke Father     ALLERGIES:  is allergic to terbinafine.  MEDICATIONS:  Current Outpatient Prescriptions  Medication Sig Dispense Refill  . atenolol (TENORMIN) 50 MG tablet Take 1 tablet by mouth 2 (two) times daily.    . Cholecalciferol (VITAMIN D) 2000 units tablet Take 1 tablet by mouth daily.    . Flaxseed, Linseed, (FLAXSEED OIL) 1000 MG CAPS Take 1 capsule by mouth daily.    Marland Kitchen losartan-hydrochlorothiazide (HYZAAR) 50-12.5 MG tablet Take 1 tablet by mouth daily.    . Magnesium 250 MG TABS Take 250 mg by mouth daily.    . Multiple Vitamin (MULTI-VITAMINS) TABS Take 1 tablet by mouth daily.    . Omega-3 Fatty Acids (FISH OIL PO) Take 1 capsule by mouth daily.     No current facility-administered medications for this visit.       Marland Kitchen  PHYSICAL EXAMINATION: ECOG PERFORMANCE STATUS: 0 - Asymptomatic  Vitals:   07/21/16 1512  BP: 135/79  Pulse: 62  Resp: 18  Temp: 97.6 F (36.4 C)   Filed Weights   07/21/16 1512  Weight: 104 lb (47.2 kg)    GENERAL: Well-nourished well-developed; Alert, no distress and comfortable. Accompanied by her husband. EYES: no pallor or icterus OROPHARYNX: no thrush or ulceration; good dentition  NECK: supple, no masses felt LYMPH:  no palpable lymphadenopathy in the cervical, axillary or inguinal regions LUNGS: clear to auscultation and  No wheeze or crackles HEART/CVS: regular rate & rhythm and no murmurs; No lower  extremity edema ABDOMEN: abdomen soft, non-tender and normal bowel sounds; mild Splenomeglay.  Musculoskeletal:no cyanosis of digits and no clubbing  PSYCH: alert & oriented x 3 with fluent speech NEURO: no focal motor/sensory deficits SKIN:  no rashes or significant lesions  LABORATORY DATA:  I have reviewed the data as listed Lab Results  Component Value Date   WBC 68.3 (HH) 07/21/2016   HGB 11.0 (L) 07/21/2016   HCT 33.4 (L) 07/21/2016   MCV 86.6 07/21/2016   PLT 183 07/21/2016    Recent Labs  02/22/16 1105 07/21/16 1453  NA 136 137  K 3.9 3.3*  CL 96* 99*  CO2 32 31  GLUCOSE 108* 120*  BUN 16 18  CREATININE 0.68 0.66  CALCIUM 9.7 9.9  GFRNONAA >60 >60  GFRAA >60 >60  PROT 7.3 7.2  ALBUMIN 4.5 4.3  AST 28 27  ALT 19 19  ALKPHOS 56 51  BILITOT 0.7 0.7    RADIOGRAPHIC STUDIES: I have personally reviewed the radiological images as listed and agreed with the findings in the report. No results found.  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 Patient continues to be asymptomatic. However today white count is elevated to 68 from a baseline of 46; otherwise hemoglobin is stable/platelets are normal.  # I discussed the above findings with the patient and husband in detail. However as she continues to be asymptomatic at this time I would recommend checking monthly blood counts; and also peripheral blood flow cytometry to next blood draw/month. Discussed that if the counts continue to rise/ and or if patient starts to get symptomatic- I would recommend treatment with Hydrea. Also the counts started to keep going up- I would recommend repeating an ultrasound of the spleen.  # The above plan of care was discussed with patient and she agrees. She'll follow-up with me in approximately 3 months CBC CMP and LDH. Monthly CBC.   # 25 minutes face-to-face with the  patient discussing the above plan of care; more than 50% of time spent on prognosis/ natural history; counseling and coordination.     Cammie Sickle, MD 07/21/2016 4:37 PM

## 2016-08-18 ENCOUNTER — Inpatient Hospital Stay: Payer: Medicare Other | Attending: Internal Medicine

## 2016-08-18 ENCOUNTER — Other Ambulatory Visit: Payer: Self-pay | Admitting: *Deleted

## 2016-08-18 DIAGNOSIS — I10 Essential (primary) hypertension: Secondary | ICD-10-CM | POA: Insufficient documentation

## 2016-08-18 DIAGNOSIS — E559 Vitamin D deficiency, unspecified: Secondary | ICD-10-CM | POA: Insufficient documentation

## 2016-08-18 DIAGNOSIS — Z79899 Other long term (current) drug therapy: Secondary | ICD-10-CM | POA: Insufficient documentation

## 2016-08-18 DIAGNOSIS — D72829 Elevated white blood cell count, unspecified: Secondary | ICD-10-CM | POA: Diagnosis not present

## 2016-08-18 DIAGNOSIS — E782 Mixed hyperlipidemia: Secondary | ICD-10-CM | POA: Diagnosis not present

## 2016-08-18 DIAGNOSIS — Z923 Personal history of irradiation: Secondary | ICD-10-CM | POA: Diagnosis not present

## 2016-08-18 DIAGNOSIS — Z853 Personal history of malignant neoplasm of breast: Secondary | ICD-10-CM | POA: Insufficient documentation

## 2016-08-18 DIAGNOSIS — M1990 Unspecified osteoarthritis, unspecified site: Secondary | ICD-10-CM | POA: Insufficient documentation

## 2016-08-18 DIAGNOSIS — D471 Chronic myeloproliferative disease: Secondary | ICD-10-CM | POA: Diagnosis not present

## 2016-08-18 LAB — CBC WITH DIFFERENTIAL/PLATELET
Basophils Absolute: 0.7 10*3/uL — ABNORMAL HIGH (ref 0–0.1)
Basophils Relative: 1 %
EOS PCT: 1 %
Eosinophils Absolute: 0.7 10*3/uL (ref 0–0.7)
HEMATOCRIT: 34.1 % — AB (ref 35.0–47.0)
HEMOGLOBIN: 11.3 g/dL — AB (ref 12.0–16.0)
Lymphocytes Relative: 5 %
Lymphs Abs: 3.6 10*3/uL (ref 1.0–3.6)
MCH: 28.7 pg (ref 26.0–34.0)
MCHC: 33.3 g/dL (ref 32.0–36.0)
MCV: 86.3 fL (ref 80.0–100.0)
MONOS PCT: 2 %
Monocytes Absolute: 1.5 10*3/uL — ABNORMAL HIGH (ref 0.2–0.9)
NEUTROS PCT: 91 %
Neutro Abs: 66.3 10*3/uL — ABNORMAL HIGH (ref 1.4–6.5)
Platelets: 189 10*3/uL (ref 150–440)
RBC: 3.95 MIL/uL (ref 3.80–5.20)
RDW: 17.6 % — AB (ref 11.5–14.5)
WBC: 72.8 10*3/uL — AB (ref 3.6–11.0)

## 2016-08-19 ENCOUNTER — Encounter: Payer: Self-pay | Admitting: Internal Medicine

## 2016-08-23 ENCOUNTER — Other Ambulatory Visit: Payer: Self-pay | Admitting: *Deleted

## 2016-08-23 DIAGNOSIS — D471 Chronic myeloproliferative disease: Secondary | ICD-10-CM

## 2016-09-18 ENCOUNTER — Inpatient Hospital Stay: Payer: Medicare Other | Attending: Internal Medicine

## 2016-09-18 DIAGNOSIS — Z923 Personal history of irradiation: Secondary | ICD-10-CM | POA: Insufficient documentation

## 2016-09-18 DIAGNOSIS — Z853 Personal history of malignant neoplasm of breast: Secondary | ICD-10-CM | POA: Insufficient documentation

## 2016-09-18 DIAGNOSIS — D471 Chronic myeloproliferative disease: Secondary | ICD-10-CM | POA: Diagnosis not present

## 2016-09-18 DIAGNOSIS — D72829 Elevated white blood cell count, unspecified: Secondary | ICD-10-CM | POA: Diagnosis not present

## 2016-09-18 LAB — CBC WITH DIFFERENTIAL/PLATELET
Band Neutrophils: 16 %
Basophils Absolute: 0 10*3/uL (ref 0–0.1)
Basophils Relative: 0 %
EOS PCT: 0 %
Eosinophils Absolute: 0 10*3/uL (ref 0–0.7)
HCT: 34.3 % — ABNORMAL LOW (ref 35.0–47.0)
Hemoglobin: 11.5 g/dL — ABNORMAL LOW (ref 12.0–16.0)
LYMPHS PCT: 6 %
Lymphs Abs: 4.6 10*3/uL — ABNORMAL HIGH (ref 1.0–3.6)
MCH: 28.8 pg (ref 26.0–34.0)
MCHC: 33.6 g/dL (ref 32.0–36.0)
MCV: 85.9 fL (ref 80.0–100.0)
MONO ABS: 1.5 10*3/uL — AB (ref 0.2–0.9)
Metamyelocytes Relative: 8 %
Monocytes Relative: 2 %
Myelocytes: 11 %
NEUTROS ABS: 69.3 10*3/uL — AB (ref 1.4–6.5)
NEUTROS PCT: 54 %
OTHER: 2 %
PLATELETS: 178 10*3/uL (ref 150–440)
Promyelocytes Absolute: 1 %
RBC: 3.99 MIL/uL (ref 3.80–5.20)
RDW: 17.6 % — ABNORMAL HIGH (ref 11.5–14.5)
Smear Review: ADEQUATE
WBC: 77 10*3/uL (ref 3.6–11.0)
nRBC: 1 /100 WBC — ABNORMAL HIGH

## 2016-09-18 LAB — COMPREHENSIVE METABOLIC PANEL
ALT: 15 U/L (ref 14–54)
ANION GAP: 7 (ref 5–15)
AST: 25 U/L (ref 15–41)
Albumin: 4.2 g/dL (ref 3.5–5.0)
Alkaline Phosphatase: 57 U/L (ref 38–126)
BUN: 18 mg/dL (ref 6–20)
CALCIUM: 9.6 mg/dL (ref 8.9–10.3)
CHLORIDE: 99 mmol/L — AB (ref 101–111)
CO2: 30 mmol/L (ref 22–32)
Creatinine, Ser: 0.7 mg/dL (ref 0.44–1.00)
GFR calc Af Amer: >60 mL/min (ref >60)
GFR calc non Af Amer: >60 mL/min (ref >60)
Glucose, Bld: 107 mg/dL — ABNORMAL HIGH (ref 65–99)
Potassium: 3.8 mmol/L (ref 3.5–5.1)
SODIUM: 136 mmol/L (ref 135–145)
Total Bilirubin: 0.7 mg/dL (ref 0.3–1.2)
Total Protein: 7.2 g/dL (ref 6.5–8.1)

## 2016-09-18 LAB — LACTATE DEHYDROGENASE: LDH: 235 U/L — ABNORMAL HIGH (ref 98–192)

## 2016-09-18 LAB — PATHOLOGIST SMEAR REVIEW

## 2016-09-21 ENCOUNTER — Telehealth: Payer: Self-pay | Admitting: *Deleted

## 2016-09-21 ENCOUNTER — Other Ambulatory Visit: Payer: Self-pay | Admitting: Internal Medicine

## 2016-09-21 LAB — COMP PANEL: LEUKEMIA/LYMPHOMA: Immunophenotypic Profile: 1

## 2016-09-21 NOTE — Telephone Encounter (Signed)
Received flow cytometry results. Faxed results to Dr. Lendon Ka office.

## 2016-09-21 NOTE — Telephone Encounter (Signed)
Per v/o Dr. Ephraim Hamburger [facility]ed [facility]-to obtain results for flow cytometry which was drawn on 09/18/16. Results not in chl per md.  Per md- pt has an apt with Dr. Tomasa Hosteller (12 noon) today at [facility], This provider will need these results.  Rn was Transfer to [employer] at [phone]. Spoke with [place].  Test is still in process. Lab results are running a day behind. Dr. Rogue Bussing made aware.  I also contacted the patient to let her know that the results are still pending.  I spoke with patient and patient's husband. I fwd the available lab results to Dr. Tomasa Hosteller and made a note on the routing note that flow cytometry was still pending.

## 2016-10-18 ENCOUNTER — Other Ambulatory Visit: Payer: Medicare Other

## 2016-10-18 ENCOUNTER — Ambulatory Visit: Payer: Medicare Other | Admitting: Internal Medicine

## 2016-11-13 ENCOUNTER — Other Ambulatory Visit: Payer: Self-pay | Admitting: Internal Medicine

## 2016-11-13 DIAGNOSIS — Z1231 Encounter for screening mammogram for malignant neoplasm of breast: Secondary | ICD-10-CM

## 2016-12-18 ENCOUNTER — Ambulatory Visit
Admission: RE | Admit: 2016-12-18 | Discharge: 2016-12-18 | Disposition: A | Discharge status: Home / Self Care | Payer: Medicare Other | Source: Ambulatory Visit | Attending: Internal Medicine | Admitting: Internal Medicine

## 2016-12-18 DIAGNOSIS — Z1231 Encounter for screening mammogram for malignant neoplasm of breast: Secondary | ICD-10-CM | POA: Insufficient documentation

## 2016-12-18 HISTORY — DX: Personal history of irradiation: Z92.3

## 2017-01-09 ENCOUNTER — Ambulatory Visit
Admission: RE | Admit: 2017-01-09 | Discharge: 2017-01-09 | Disposition: A | Discharge status: Home / Self Care | Payer: Medicare Other | Source: Ambulatory Visit | Attending: Ophthalmology | Admitting: Ophthalmology

## 2017-01-09 ENCOUNTER — Other Ambulatory Visit: Payer: Self-pay | Admitting: Ophthalmology

## 2017-01-09 DIAGNOSIS — H05011 Cellulitis of right orbit: Secondary | ICD-10-CM

## 2017-01-09 MED ORDER — IOPAMIDOL (ISOVUE-300) INJECTION 61%
75.0000 mL | Freq: Once | INTRAVENOUS | Status: AC | PRN
  Administered 2017-01-09: 75 mL via INTRAVENOUS

## 2017-01-10 ENCOUNTER — Other Ambulatory Visit: Payer: Self-pay | Admitting: Ophthalmology

## 2017-01-10 ENCOUNTER — Ambulatory Visit
Admission: RE | Admit: 2017-01-10 | Discharge: 2017-01-10 | Disposition: A | Discharge status: Home / Self Care | Payer: Medicare Other | Source: Ambulatory Visit | Attending: Ophthalmology | Admitting: Ophthalmology

## 2017-01-10 DIAGNOSIS — I770 Arteriovenous fistula, acquired: Secondary | ICD-10-CM | POA: Diagnosis not present

## 2017-01-10 MED ORDER — IOPAMIDOL (ISOVUE-370) INJECTION 76%
75.0000 mL | Freq: Once | INTRAVENOUS | Status: AC | PRN
  Administered 2017-01-10: 75 mL via INTRAVENOUS

## 2017-02-22 DIAGNOSIS — M818 Other osteoporosis without current pathological fracture: Secondary | ICD-10-CM | POA: Insufficient documentation

## 2017-11-15 ENCOUNTER — Other Ambulatory Visit: Payer: Self-pay | Admitting: Internal Medicine

## 2017-11-15 DIAGNOSIS — Z1231 Encounter for screening mammogram for malignant neoplasm of breast: Secondary | ICD-10-CM

## 2017-12-19 ENCOUNTER — Ambulatory Visit
Admission: RE | Admit: 2017-12-19 | Discharge: 2017-12-19 | Disposition: A | Discharge status: Home / Self Care | Payer: Medicare Other | Source: Ambulatory Visit | Attending: Internal Medicine | Admitting: Internal Medicine

## 2017-12-19 DIAGNOSIS — Z1231 Encounter for screening mammogram for malignant neoplasm of breast: Secondary | ICD-10-CM

## 2018-08-02 DIAGNOSIS — D649 Anemia, unspecified: Secondary | ICD-10-CM | POA: Insufficient documentation

## 2018-08-20 ENCOUNTER — Telehealth: Payer: Self-pay | Admitting: Internal Medicine

## 2018-08-20 NOTE — Telephone Encounter (Signed)
I spoke to Dr.Rizzeri; [facility]; recommend HMA for MDS/MPN.   Please have pt follow up with me next Wednesday- 03/18- cbc/cmp/ldh to discuss treatment options. Thanks GB

## 2018-08-26 ENCOUNTER — Other Ambulatory Visit: Payer: Self-pay | Admitting: Internal Medicine

## 2018-08-26 DIAGNOSIS — D72829 Elevated white blood cell count, unspecified: Secondary | ICD-10-CM

## 2018-08-28 ENCOUNTER — Other Ambulatory Visit: Payer: Self-pay

## 2018-08-28 ENCOUNTER — Inpatient Hospital Stay (HOSPITAL_BASED_OUTPATIENT_CLINIC_OR_DEPARTMENT_OTHER): Payer: Medicare Other | Admitting: Internal Medicine

## 2018-08-28 ENCOUNTER — Encounter: Payer: Self-pay | Admitting: Internal Medicine

## 2018-08-28 ENCOUNTER — Inpatient Hospital Stay: Payer: Medicare Other | Attending: Internal Medicine

## 2018-08-28 DIAGNOSIS — Z7189 Other specified counseling: Secondary | ICD-10-CM | POA: Insufficient documentation

## 2018-08-28 DIAGNOSIS — Z79899 Other long term (current) drug therapy: Secondary | ICD-10-CM

## 2018-08-28 DIAGNOSIS — D469 Myelodysplastic syndrome, unspecified: Secondary | ICD-10-CM | POA: Diagnosis present

## 2018-08-28 DIAGNOSIS — E559 Vitamin D deficiency, unspecified: Secondary | ICD-10-CM | POA: Diagnosis not present

## 2018-08-28 DIAGNOSIS — E782 Mixed hyperlipidemia: Secondary | ICD-10-CM

## 2018-08-28 DIAGNOSIS — I10 Essential (primary) hypertension: Secondary | ICD-10-CM

## 2018-08-28 DIAGNOSIS — C946 Myelodysplastic disease, not classified: Secondary | ICD-10-CM | POA: Insufficient documentation

## 2018-08-28 DIAGNOSIS — D72829 Elevated white blood cell count, unspecified: Secondary | ICD-10-CM

## 2018-08-28 DIAGNOSIS — D649 Anemia, unspecified: Secondary | ICD-10-CM | POA: Diagnosis not present

## 2018-08-28 NOTE — Progress Notes (Signed)
START ON PATHWAY REGIMEN - MDS     A cycle is every 28 days:     Azacitidine   **Always confirm dose/schedule in your pharmacy ordering system**  Patient Characteristics: Higher-Risk (IPSS-R Score > 3.5), First Line, Not a Transplant Candidate WHO Disease Classification: MDS-U Bone Marrow Blasts (percent): > 2% to < 5% Cytogenetic Category: Intermediate Platelets (x 10^9/L): 50 to < 100 Absolute Neutrophil Count (x 10^9/L): ? 0.8 Line of Therapy: First Line IPSS-R Risk Category: Intermediate IPSS-R Risk Score: 4.5 Check here if patient's risk score was calculated prior to the International Prognostic Scoring System-Revised (IPSS-R): false Hemoglobin (g/dl): 8 to < 10 Patient Characteristics: Not a Transplant Candidate Intent of Therapy: Non-Curative / Palliative Intent, Discussed with Patient

## 2018-08-28 NOTE — Progress Notes (Signed)
[facility] CONSULT NOTE  Patient Care Team: Rusty Aus, MD as PCP - General (Internal Medicine)  CHIEF COMPLAINTS/PURPOSE OF CONSULTATION:    Oncology History   # SEP 2017- MYELOPROLIFERATIVE NEOPLASM- [WBC- 18; normal Hb/platelets] hypercellular bone marrow 90-95% proliferation of myeloid cells in various stages of maturation; relative erythroid hypoplasia and proliferation of atypical megakaryocytes; no increase in blasts; peripheral blood Bcr-Abl-NEG; Cytogenetics- WNL. NEG- Jak-2/MPL/CALR [place] limited- mild splenomegaly [~10cm; 463cm3]; OCT 2017- second opinion at [place]. Surveillance.   # With progressive leukocytosis we evaluated her a month ago and sent BM for exam. She has a progressive leukocytosis so hydrea 500mg  daily was started on 10/20/2016 and increased to 2gm daily then back down to one daily, then 5 days per week over time due to counts drop. Then we held her hydrea since 04/09/2018 due to continued declining Hb and Plt.s  BMB 06/24/18 showed markedly hypercellular BM 95% with myeloid hyperplasia and atypical megakaryocytic hyperplasia. No significant increase in blasts. 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 BCR / ABL negative, FISH normal. Flow Showed 2%CD34-positive myeloid blasts. Myeloid precursors with low side scatter. Pathogenic variants were detected in the ASXL1, CCND2, CUX1, and U2AF1 genes.  # March 2020- wbc- 14/ Hb 9.5/ platelets- 54.   ---------------------------------------------------     # 2002 [[place]] BREAST CA s/p Lumpect RT; [? Stage I] no chemo s/p AI      MDS (myelodysplastic syndrome) ([place])   08/28/2018 Initial Diagnosis    MDS (myelodysplastic syndrome) (HCC)      HISTORY OF PRESENTING ILLNESS:  Kathryn Hahn 85 y.o.  female with MDS/MPN is here for follow-up.  Patient has been following up with Dr. Alvera Singh at [facility]; records have been reviewed and  summarized as above.  Given the progression of disease she is recommended to start [place].  She is also been started on Aranesp.  Patient complains of mild fatigue.  Otherwise denies any nosebleeds or gum bleeding.  Denies any easy bruising or spontaneous bleeding.  She complains of joint aches.  Current resolved.  No fevers or chills.  No nausea no vomiting.  Review of Systems  Constitutional: Positive for malaise/fatigue. Negative for chills, diaphoresis, fever and weight loss.  HENT: Negative for nosebleeds and sore throat.   Eyes: Negative for double vision.  Respiratory: Negative for cough, hemoptysis, sputum production, shortness of breath and wheezing.   Cardiovascular: Negative for chest pain, palpitations, orthopnea and leg swelling.  Gastrointestinal: Negative for abdominal pain, blood in stool, constipation, diarrhea, heartburn, melena, nausea and vomiting.  Genitourinary: Negative for dysuria, frequency and urgency.  Musculoskeletal: Negative for back pain and joint pain.  Skin: Negative.  Negative for itching and rash.  Neurological: Negative for dizziness, tingling, focal weakness, weakness and headaches.  Endo/Heme/Allergies: Does not bruise/bleed easily.  Psychiatric/Behavioral: Negative for depression. The patient is not nervous/anxious and does not have insomnia.      MEDICAL HISTORY:  Past Medical History:  Diagnosis Date  . Anemia   . Arthritis   . Breast cancer ([place]) 2003   RT LUMPECTOMY  . Edema leg   . History of breast cancer 2003   post lumpectomy  . History of kidney stones   . Hyperlipemia, mixed   . Hypertension, essential   . Leukocytosis   . Personal history of radiation therapy 2003   BREAST CA  . Renal stones   . Vitamin D deficiency     SURGICAL HISTORY: Past Surgical History:  Procedure  Laterality Date  . BREAST BIOPSY Right 2003   Postive for cancer  . BREAST LUMPECTOMY Right 2003   BREAST CA  . KIDNEY STONE SURGERY Right      SOCIAL HISTORY: Social History   Socioeconomic History  . Marital status: Married    Spouse name: Not on file  . Number of children: Not on file  . Years of education: Not on file  . Highest education level: Not on file  Occupational History  . Not on file  Social Needs  . Financial resource strain: Not on file  . Food insecurity:    Worry: Not on file    Inability: Not on file  . Transportation needs:    Medical: Not on file    Non-medical: Not on file  Tobacco Use  . Smoking status: Never Smoker  . Smokeless tobacco: Never Used  Substance and Sexual Activity  . Alcohol use: Yes    Alcohol/week: 3.0 standard drinks    Types: 3 Glasses of wine per week  . Drug use: No  . Sexual activity: Not on file  Lifestyle  . Physical activity:    Days per week: Not on file    Minutes per session: Not on file  . Stress: Not on file  Relationships  . Social connections:    Talks on phone: Not on file    Gets together: Not on file    Attends religious service: Not on file    Active member of club or organization: Not on file    Attends meetings of clubs or organizations: Not on file    Relationship status: Not on file  . Intimate partner violence:    Fear of current or ex partner: Not on file    Emotionally abused: Not on file    Physically abused: Not on file    Forced sexual activity: Not on file  Other Topics Concern  . Not on file  Social History Narrative    No smoking/alcohol; lives in [place] with family.  She lives in assisted living with her husband.    FAMILY HISTORY: Family History  Problem Relation Age of Onset  . Stroke Mother   . Hypertension Father   . Stroke Father   . Breast cancer Neg Hx     ALLERGIES:  is allergic to terbinafine.  MEDICATIONS:  Current Outpatient Medications  Medication Sig Dispense Refill  . atenolol (TENORMIN) 50 MG tablet Take 1 tablet by mouth 2 (two) times daily.    Marland Kitchen CALCIUM-VITAMIN D PO Take by mouth.    .  Cholecalciferol (VITAMIN D) 2000 units tablet Take 1 tablet by mouth daily.    . Flaxseed, Linseed, (FLAXSEED OIL) 1000 MG CAPS Take 1 capsule by mouth daily.    Marland Kitchen losartan-hydrochlorothiazide (HYZAAR) 50-12.5 MG tablet Take 1 tablet by mouth daily.    . Magnesium 250 MG TABS Take 250 mg by mouth daily.    . Multiple Vitamin (MULTI-VITAMINS) TABS Take 1 tablet by mouth daily.    . Omega-3 Fatty Acids (FISH OIL PO) Take 1 capsule by mouth daily.    . Polysaccharide Iron Complex (FERREX 150 PO) Take by mouth.     No current facility-administered medications for this visit.       Marland Kitchen  PHYSICAL EXAMINATION: ECOG PERFORMANCE STATUS: 0 - Asymptomatic  Vitals:   08/28/18 1141  BP: 116/70  Pulse: 64  Resp: 16  Temp: 97.7 F (36.5 C)   Filed Weights   08/28/18 1141  Weight: 101 lb (45.8 kg)    Physical Exam  Constitutional: She is oriented to person, place, and time and well-developed, well-nourished, and in no distress.  HENT:  Head: Normocephalic and atraumatic.  Mouth/Throat: Oropharynx is clear and moist. No oropharyngeal exudate.  Eyes: Pupils are equal, round, and reactive to light.  Neck: Normal range of motion. Neck supple.  Cardiovascular: Normal rate and regular rhythm.  Pulmonary/Chest: No respiratory distress. She has no wheezes.  Abdominal: Soft. Bowel sounds are normal. She exhibits no distension and no mass. There is no abdominal tenderness. There is no rebound and no guarding.  Musculoskeletal: Normal range of motion.        General: No tenderness or edema.  Neurological: She is alert and oriented to person, place, and time.  Skin: Skin is warm.  Psychiatric: Affect normal.    LABORATORY DATA:  I have reviewed the data as listed Lab Results  Component Value Date   WBC 77.0 (HH) 09/18/2016   HGB 11.5 (L) 09/18/2016   HCT 34.3 (L) 09/18/2016   MCV 85.9 09/18/2016   PLT 178 09/18/2016   No results for input(s): NA, K, CL, CO2, GLUCOSE, BUN, CREATININE,  CALCIUM, GFRNONAA, GFRAA, PROT, ALBUMIN, AST, ALT, ALKPHOS, BILITOT, BILIDIR, IBILI in the last 8760 hours.  RADIOGRAPHIC STUDIES: I have personally reviewed the radiological images as listed and agreed with the findings in the report. No results found.  ASSESSMENT & PLAN:   MDS/MPN (myelodysplastic/myeloproliferative neoplasms) (HCC) #MPN/MDS unclassified-patient's white count is 14 hemoglobin is 9.5 platelets 54.  #Patient is mildly symptomatic-with mild fatigue; otherwise no spontaneous bleeding or gum bleeding.  #Agree with recommendations from [place] regarding starting on hypo-methylating agents like Vidaza day 1 through 5 every 28 days.  Discussed the potential side effects including but not limited to-increasing fatigue, nausea vomiting, diarrhea,sores in the mouth, increase risk of infection; need for transfusions etc.  # Anemia symptomatic with fatigue- on aranesp [on 3/10]; recommend Aranesp every 2 weeks.  #Had a long discussion the patient and husband regarding the timing of starting on Vidaza.  Given the Covid-19 pandemic and the fact patient will need fairly intensive follow-up in the clinic/need for blood transfusions etc/immunocompromise state-I think it is reasonable to hold off starting therapy at this time.  However we will monitor patient closely.  # Educated the patient regarding novel coronavirus-modes of transmission/risks; and measures to avoid infection.   #Discussed with Dr. Brooke Dare at [facility], he agrees with the plan.  # 40 minutes face-to-face with the patient discussing the above plan of care; more than 50% of time spent on prognosis/ natural history; counseling and coordination.  # DISPOSITION; # will call for aranesp injection this week # 2 weeks/cbc/ldh- posssible aranesp # 4 weeks- cbc/bmp/ld- possible aranesp-Dr.B       Cammie Sickle, MD 08/28/2018 1:13 PM

## 2018-08-28 NOTE — Progress Notes (Signed)
Patient on plan of care prior to pathways. 

## 2018-08-28 NOTE — Assessment & Plan Note (Addendum)
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  #Patient is mildly symptomatic-with mild fatigue; otherwise no spontaneous bleeding or gum bleeding.  #Agree with recommendations from [place] regarding starting on hypo-methylating agents like Vidaza day 1 through 5 every 28 days.  Discussed the potential side effects including but not limited to-increasing fatigue, nausea vomiting, diarrhea,sores in the mouth, increase risk of infection; need for transfusions etc.  # Anemia symptomatic with fatigue- on aranesp [on 3/10]; recommend Aranesp every 2 weeks.  #Had a long discussion the patient and husband regarding the timing of starting on Vidaza.  Given the Covid-19 pandemic and the fact patient will need fairly intensive follow-up in the clinic/need for blood transfusions etc/immunocompromise state-I think it is reasonable to hold off starting therapy at this time.  However we will monitor patient closely.  # Educated the patient regarding novel coronavirus-modes of transmission/risks; and measures to avoid infection.   #Discussed with Dr. Brooke Dare at [facility], he agrees with the plan.  # 40 minutes face-to-face with the patient discussing the above plan of care; more than 50% of time spent on prognosis/ natural history; counseling and coordination.  # DISPOSITION; # will call for aranesp injection this week # 2 weeks/cbc/ldh- posssible aranesp # 4 weeks- cbc/bmp/ld- possible aranesp-Dr.B

## 2018-08-30 ENCOUNTER — Telehealth: Payer: Self-pay | Admitting: *Deleted

## 2018-08-30 NOTE — Telephone Encounter (Signed)
Husband called asking if Dr B has spoken with physician at [facility] and what the outcome of that conversation is. Please return his call [phone]    ASSESSMENT & PLAN:   MDS/MPN (myelodysplastic/myeloproliferative neoplasms) ([place]) #MPN/MDS unclassified-patient's white count is 14 hemoglobin is 9.5 platelets 54.  #Patient is mildly symptomatic-with mild fatigue; otherwise no spontaneous bleeding or gum bleeding.  #Agree with recommendations from [place] regarding starting on hypo-methylating agents like Vidaza day 1 through 5 every 28 days.  Discussed the potential side effects including but not limited to-increasing fatigue, nausea vomiting, diarrhea,sores in the mouth, increase risk of infection; need for transfusions etc.  # Anemia symptomatic with fatigue- on aranesp [on 3/10]; recommend Aranesp every 2 weeks.  #Had a long discussion the patient and husband regarding the timing of starting on Vidaza.  Given the Covid-19 pandemic and the fact patient will need fairly intensive follow-up in the clinic/need for blood transfusions etc/immunocompromise state-I think it is reasonable to hold off starting therapy at this time.  However we will monitor patient closely.  # Educated the patient regarding novel coronavirus-modes of transmission/risks; and measures to avoid infection.   #Discussed with Dr. Brooke Dare at [facility], he agrees with the plan.  # 40 minutes face-to-face with the patient discussing the above plan of care; more than 50% of time spent on prognosis/ natural history; counseling and coordination.  # DISPOSITION; # will call for aranesp injection this week # 2 weeks/cbc/ldh- posssible aranesp # 4 weeks- cbc/bmp/ld- possible aranesp-Dr.B       Cammie Sickle, MD 08/28/2018 1:13 PM

## 2018-08-30 NOTE — Telephone Encounter (Signed)
Spoke to pt's husband- that disucssed with Dr.Rizerri. For now hold vidaza; continue to aranesp. Follow up as planned.

## 2018-09-09 ENCOUNTER — Other Ambulatory Visit: Payer: Self-pay

## 2018-09-09 DIAGNOSIS — D469 Myelodysplastic syndrome, unspecified: Secondary | ICD-10-CM

## 2018-09-10 ENCOUNTER — Other Ambulatory Visit: Payer: Self-pay

## 2018-09-11 ENCOUNTER — Inpatient Hospital Stay: Payer: Medicare Other | Attending: Internal Medicine

## 2018-09-11 ENCOUNTER — Other Ambulatory Visit: Payer: Self-pay

## 2018-09-11 VITALS — BP 124/75 | HR 67

## 2018-09-11 DIAGNOSIS — D469 Myelodysplastic syndrome, unspecified: Secondary | ICD-10-CM

## 2018-09-11 LAB — CBC WITH DIFFERENTIAL/PLATELET
Abs Immature Granulocytes: 0.2 10*3/uL — ABNORMAL HIGH (ref 0.00–0.07)
Basophils Absolute: 0 10*3/uL (ref 0.0–0.1)
Basophils Relative: 0 %
Blasts: 3 %
Eosinophils Absolute: 0 10*3/uL (ref 0.0–0.5)
Eosinophils Relative: 0 %
HCT: 29.2 % — ABNORMAL LOW (ref 36.0–46.0)
Hemoglobin: 8.6 g/dL — ABNORMAL LOW (ref 12.0–15.0)
Lymphocytes Relative: 23 %
Lymphs Abs: 2 10*3/uL (ref 0.7–4.0)
MCH: 22.8 pg — ABNORMAL LOW (ref 26.0–34.0)
MCHC: 29.5 g/dL — ABNORMAL LOW (ref 30.0–36.0)
MCV: 77.5 fL — ABNORMAL LOW (ref 80.0–100.0)
Metamyelocytes Relative: 1 %
Monocytes Absolute: 0.7 10*3/uL (ref 0.1–1.0)
Monocytes Relative: 8 %
Myelocytes: 1 %
Neutro Abs: 5.5 10*3/uL (ref 1.7–7.7)
Neutrophils Relative %: 64 %
Platelets: 54 10*3/uL — ABNORMAL LOW (ref 150–400)
RBC: 3.77 MIL/uL — ABNORMAL LOW (ref 3.87–5.11)
RDW: 17.5 % — ABNORMAL HIGH (ref 11.5–15.5)
Smear Review: NORMAL
WBC Morphology: 3
WBC: 8.6 10*3/uL (ref 4.0–10.5)
nRBC: 0.2 % (ref 0.0–0.2)

## 2018-09-11 LAB — BASIC METABOLIC PANEL
Anion gap: 7 (ref 5–15)
BUN: 21 mg/dL (ref 8–23)
CO2: 30 mmol/L (ref 22–32)
Calcium: 9.5 mg/dL (ref 8.9–10.3)
Chloride: 98 mmol/L (ref 98–111)
Creatinine, Ser: 0.58 mg/dL (ref 0.44–1.00)
GFR calc Af Amer: >60 mL/min (ref >60)
GFR calc non Af Amer: >60 mL/min (ref >60)
Glucose, Bld: 91 mg/dL (ref 70–99)
Potassium: 3.9 mmol/L (ref 3.5–5.1)
Sodium: 135 mmol/L (ref 135–145)

## 2018-09-11 LAB — LACTATE DEHYDROGENASE: LDH: 190 U/L (ref 98–192)

## 2018-09-11 MED ORDER — DARBEPOETIN ALFA 300 MCG/0.6ML IJ SOSY
300.0000 ug | PREFILLED_SYRINGE | Freq: Once | INTRAMUSCULAR | Status: AC
  Administered 2018-09-11: 300 ug via SUBCUTANEOUS
  Filled 2018-09-11: qty 0.6

## 2018-09-18 ENCOUNTER — Inpatient Hospital Stay (HOSPITAL_BASED_OUTPATIENT_CLINIC_OR_DEPARTMENT_OTHER): Payer: Medicare Other | Admitting: Internal Medicine

## 2018-09-18 ENCOUNTER — Telehealth: Payer: Self-pay | Admitting: Internal Medicine

## 2018-09-18 ENCOUNTER — Other Ambulatory Visit: Payer: Self-pay

## 2018-09-18 ENCOUNTER — Other Ambulatory Visit: Payer: Self-pay | Admitting: *Deleted

## 2018-09-18 ENCOUNTER — Encounter: Payer: Self-pay | Admitting: Internal Medicine

## 2018-09-18 ENCOUNTER — Telehealth: Payer: Self-pay | Admitting: *Deleted

## 2018-09-18 DIAGNOSIS — D471 Chronic myeloproliferative disease: Secondary | ICD-10-CM

## 2018-09-18 DIAGNOSIS — D469 Myelodysplastic syndrome, unspecified: Secondary | ICD-10-CM

## 2018-09-18 NOTE — Assessment & Plan Note (Signed)
#  MPN/MDS unclassified-currently on Aranesp.  Hemoglobin 8.7 platelets 54  #Given the drop in hemoglobin recommend adding IV Feraheme; iron studies from [facility] in March 2020 reviewed.  #Continue to hold Vidaza given COVID-19 pandemic.  # DISPOSITION; #Keep labs/Aranesp scheduled next week #Add Feraheme next week separate day # in 3 weeks [from now]-CBC/bmp-Aranesp/Feraheme separate days; the following day- MD/Video-Dr.B

## 2018-09-18 NOTE — Telephone Encounter (Signed)
MD to telehealth patient.

## 2018-09-18 NOTE — Telephone Encounter (Signed)
Patient called asking for results from last weeks labs. [phone]  );...   Ref Range & Units 7d ago (09/11/18) 23yr ago (09/18/16) 54yr ago (08/18/16) 20yr ago (07/21/16)  WBC 4.0 - 10.5 K/uL 8.6  77.0High Panic  R, CM 72.8High Panic  R, CM 68.3High Panic  R, CM  RBC 3.87 - 5.11 MIL/uL 3.77Low   3.99 R 3.95 R 3.86 R  Hemoglobin 12.0 - 15.0 g/dL 8.6Low   11.5Low  R 11.3Low  R 11.0Low  R  Comment: Reticulocyte Hemoglobin testing  may be clinically indicated,  consider ordering this additional  test [id number]   HCT 36.0 - 46.0 % 29.2Low   34.3Low  R 34.1Low  R 33.4Low  R  MCV 80.0 - 100.0 fL 77.5Low   85.9  86.3  86.6   MCH 26.0 - 34.0 pg 22.8Low   28.8  28.7  28.5   MCHC 30.0 - 36.0 g/dL 29.5Low   33.6 R 33.3 R 32.9 R  RDW 11.5 - 15.5 % 17.5High   17.6High  R 17.6High  R 17.9High  R  Platelets 150 - 400 K/uL 54Low   178 R 189 R 183 R  Comment: Immature Platelet Fraction may be  clinically indicated, consider  ordering this additional test  [id number]   nRBC 0.0 - 0.2 % 0.2  1High  R    Neutrophils Relative % % 64  54  91  90   Neutro Abs 1.7 - 7.7 K/uL 5.5  69.3High  R 66.3High  R 61.8High  R  Lymphocytes Relative % 23  6  5  7    Lymphs Abs 0.7 - 4.0 K/uL 2.0  4.6High  R 3.6 R 4.7High  R  Monocytes Relative % 8  2  2  2    Monocytes Absolute 0.1 - 1.0 K/uL 0.7  1.5High  R 1.5High  R 1.2High  R  Eosinophils Relative % 0  0  1  0   Eosinophils Absolute 0.0 - 0.5 K/uL 0.0  0.0 R 0.7 R 0.3 R  Basophils Relative % 0  0  1  1   Basophils Absolute 0.0 - 0.1 K/uL 0.0  0.0 R 0.7High  R 0.3High  R  WBC Morphology  3% BLASTS NOTED ON SMEAR. CONSISTANT WITH PREVIOUS FINDINGS      RBC Morphology  OVALOCYTES VC MIXED RBC POPULATION CM POLYCHROMASIA PRESENT    Comment: MIXED RBC POPULATION  Smear Review  Normal platelet morphology  PLATELETS APPEAR ADEQUATE CM SMEAR SCANNED  CM SMEAR SCANNED   Comment: PLATELETS APPEAR DECREASED  Metamyelocytes Relative % 1  8     Myelocytes % 1  11     Blasts % 3       Abs Immature Granulocytes 0.00 - 0.07 K/uL 0.20High       Tear Drop Cells  PRESENT      Ovalocytes  PRESENT      Comment: Performed at [facility], [place], [place] [address]  Band Neutrophils   16 R    Promyelocytes Absolute   1 R    Other   2 R, CM    Resulting Agency  [place] [facility] [place] [facility] [place] [facility]      Specimen Collected: 09/11/18 10:25 Last Resulted: 09/11/18 12:38      Lab Flowsheet     Order Details     View Encounter     Lab and Collection Details  Routing     Result History      VC=Value has a corrected status CM=Additional commentsR=Reference range differs from displayed range      Other Results from 09/11/2018   Lactate dehydrogenase  Order: [id number]   Status:  Final result Visible to patient:  Yes (MyChart) Next appt:  09/25/2018 at 10:30 AM in Oncology ([facility]) Dx:  MDS (myelodysplastic syndrome) ([place]);...   Ref Range & Units 7d ago (09/11/18) 10yr ago (09/18/16) 56yr ago (07/21/16) 55yr ago (02/22/16)  LDH 98 - 192 U/L 190  235High   267High   251High    Comment: Performed at [facility], [address] [place], [place] [address]  Resulting Agency  [facility] [place] [facility] [place] [facility]      Specimen Collected: 09/11/18 10:25 Last Resulted: 09/11/18 10:49      Lab Flowsheet     Order Details     View Encounter     Lab and Collection Details     Routing     Result History            Basic metabolic panel  Order: [id number]   Status:  Final result Visible to patient:  Yes (MyChart) Next appt:  09/25/2018 at 10:30 AM in Oncology ([facility]) Dx:  MDS (myelodysplastic syndrome) ([place]);...   Ref Range & Units 7d ago (09/11/18) 58yr ago (09/18/16) 52yr ago (07/21/16) 9yr ago (02/22/16)  Sodium 135 - 145 mmol/L 135  136  137  136   Potassium 3.5 - 5.1 mmol/L 3.9  3.8  3.3Low   3.9   Chloride 98 - 111 mmol/L 98  99Low  R 99Low  R 96Low  R  CO2 22 - 32 mmol/L 30  30  31   32    Glucose, Bld 70 - 99 mg/dL 91  107High  R 120High  R 108High  R  BUN 8 - 23 mg/dL 21  18 R 18 R 16 R  Creatinine, Ser 0.44 - 1.00 mg/dL 0.58  0.70  0.66  0.68   Calcium 8.9 - 10.3 mg/dL 9.5  9.6  9.9  9.7   GFR calc non Af Amer >60 mL/min >60  >60  >60  >60   GFR calc Af Amer >60 mL/min >60  >60 CM >60 CM >60 CM  Anion gap 5 - 15 7  7  7  8    Comment: Performed at [facility], [address] [place], [place] [address]  Resulting Agency  [facility]      Specimen Collected: 09/11/18 10:25 Last Resulted: 09/11/18 10:49

## 2018-09-18 NOTE — Progress Notes (Signed)
I connected with @NAME  @ on 09/18/18 at 11:00 AM EDTby telephone and verified that I am speaking with the patient using 2 identifiers.  # LOCATION:  Patient:home Provider: office  I discussed the limitations, risks, security and privacy concerns of performing an evaluation and management service by telephone and the availability of in person appointments.  I also discussed with the patient that there may be a patient responsible charge related to the service.  The patient expressed understanding and agrees to proceed.  History of present illness:Kathryn Hahn 85 y.o.  female with history of anemia/worsening MDS-currently on Aranesp.   Mild to moderate fatigue.  Otherwise no nausea no vomiting.  No blood in stool black or stools.  No gum bleeding or nosebleeds.  Observation/objective: Hemoglobin 8.7.  Platelets 54.  Assessment and plan: MDS/MPN (myelodysplastic/myeloproliferative neoplasms) ([place]) #MPN/MDS unclassified-currently on Aranesp.  Hemoglobin 8.7 platelets 54  #Given the drop in hemoglobin recommend adding IV Feraheme; iron studies from [facility] in March 2020 reviewed.  #Continue to hold Vidaza given COVID-19 pandemic.  # DISPOSITION; #Keep labs/Aranesp scheduled next week #Add Feraheme next week separate day # in 3 weeks [from now]-CBC/bmp-Aranesp/Feraheme separate days; the following day- MD/Video-Dr.B   Follow-up instructions:  I discussed the assessment and treatment plan with the patient.  The patient was provided an opportunity to ask questions and all were answered.  The patient agreed with the plan and demonstrated understanding of instructions.  The patient was advised to call back or seek an in person evaluation if the symptoms worsen or if the condition fails to improve as anticipated.  I provided 12 minutes of non-face-to-face time during this encounter   Dr. Charlaine Dalton [facility] at [facility] 09/18/2018 12:32 PM

## 2018-09-18 NOTE — Telephone Encounter (Signed)
x

## 2018-09-23 ENCOUNTER — Telehealth: Payer: Self-pay | Admitting: *Deleted

## 2018-09-23 ENCOUNTER — Telehealth: Payer: Self-pay | Admitting: Internal Medicine

## 2018-09-23 NOTE — Telephone Encounter (Signed)
Left a message for the patient's brother Dr. Bobby Rumpf Carti-at the given number.  Left the office number to call us back tomorrow.

## 2018-09-23 NOTE — Telephone Encounter (Signed)
Patient called stating her brother who is a doctor wants to speak with Dr Rogue Bussing. His name is Dr Jarvis Morgan [phone] and he would like a call today if at all possible

## 2018-09-24 ENCOUNTER — Other Ambulatory Visit: Payer: Self-pay

## 2018-09-24 NOTE — Telephone Encounter (Signed)
Patient's brother-Dr. Bobby Rumpf Carti returned your phone call [phone]. He left a vm that he was returning your phone call.

## 2018-09-24 NOTE — Telephone Encounter (Signed)
See separate md phone note encounter

## 2018-09-25 ENCOUNTER — Ambulatory Visit: Payer: PRIVATE HEALTH INSURANCE | Admitting: Internal Medicine

## 2018-09-25 ENCOUNTER — Inpatient Hospital Stay: Payer: Medicare Other | Admitting: *Deleted

## 2018-09-25 ENCOUNTER — Other Ambulatory Visit: Payer: Self-pay

## 2018-09-25 ENCOUNTER — Inpatient Hospital Stay: Payer: Medicare Other

## 2018-09-25 ENCOUNTER — Telehealth: Payer: Self-pay | Admitting: Internal Medicine

## 2018-09-25 DIAGNOSIS — D471 Chronic myeloproliferative disease: Secondary | ICD-10-CM

## 2018-09-25 DIAGNOSIS — D469 Myelodysplastic syndrome, unspecified: Secondary | ICD-10-CM

## 2018-09-25 LAB — CBC WITH DIFFERENTIAL/PLATELET
Abs Immature Granulocytes: 0.8 10*3/uL — ABNORMAL HIGH (ref 0.00–0.07)
Basophils Absolute: 0 10*3/uL (ref 0.0–0.1)
Basophils Relative: 0 %
Blasts: 3 %
Eosinophils Absolute: 0.1 10*3/uL (ref 0.0–0.5)
Eosinophils Relative: 1 %
HCT: 33.3 % — ABNORMAL LOW (ref 36.0–46.0)
Hemoglobin: 9.7 g/dL — ABNORMAL LOW (ref 12.0–15.0)
Lymphocytes Relative: 25 %
Lymphs Abs: 3 10*3/uL (ref 0.7–4.0)
MCH: 22.7 pg — ABNORMAL LOW (ref 26.0–34.0)
MCHC: 29.1 g/dL — ABNORMAL LOW (ref 30.0–36.0)
MCV: 78 fL — ABNORMAL LOW (ref 80.0–100.0)
Metamyelocytes Relative: 4 %
Monocytes Absolute: 1 10*3/uL (ref 0.1–1.0)
Monocytes Relative: 8 %
Myelocytes: 3 %
Neutro Abs: 6.8 10*3/uL (ref 1.7–7.7)
Neutrophils Relative %: 56 %
Platelets: 56 10*3/uL — ABNORMAL LOW (ref 150–400)
RBC: 4.27 MIL/uL (ref 3.87–5.11)
RDW: 17.9 % — ABNORMAL HIGH (ref 11.5–15.5)
Smear Review: DECREASED
WBC: 12.1 10*3/uL — ABNORMAL HIGH (ref 4.0–10.5)
nRBC: 0.2 % (ref 0.0–0.2)

## 2018-09-25 LAB — BASIC METABOLIC PANEL
Anion gap: 6 (ref 5–15)
BUN: 23 mg/dL (ref 8–23)
CO2: 31 mmol/L (ref 22–32)
Calcium: 9.5 mg/dL (ref 8.9–10.3)
Chloride: 99 mmol/L (ref 98–111)
Creatinine, Ser: 0.69 mg/dL (ref 0.44–1.00)
GFR calc Af Amer: >60 mL/min (ref >60)
GFR calc non Af Amer: >60 mL/min (ref >60)
Glucose, Bld: 100 mg/dL — ABNORMAL HIGH (ref 70–99)
Potassium: 3.6 mmol/L (ref 3.5–5.1)
Sodium: 136 mmol/L (ref 135–145)

## 2018-09-25 LAB — IRON AND TIBC
Iron: 75 ug/dL (ref 28–170)
Saturation Ratios: 22 % (ref 10.4–31.8)
TIBC: 348 ug/dL (ref 250–450)
UIBC: 274 ug/dL

## 2018-09-25 LAB — LACTATE DEHYDROGENASE: LDH: 230 U/L — ABNORMAL HIGH (ref 98–192)

## 2018-09-25 LAB — FERRITIN: Ferritin: 82 ng/mL (ref 11–307)

## 2018-09-25 MED ORDER — DARBEPOETIN ALFA 300 MCG/0.6ML IJ SOSY
300.0000 ug | PREFILLED_SYRINGE | Freq: Once | INTRAMUSCULAR | Status: AC
  Administered 2018-09-25: 11:00:00 300 ug via SUBCUTANEOUS
  Filled 2018-09-25: qty 0.6

## 2018-09-25 NOTE — Telephone Encounter (Signed)
Long discussion the patient's brother Dr. Bobby Rumpf Carti [retired internist]-who was concerned about patient getting Feraheme infusion.   I had a prolonged discussion regarding the reason for holding [place].  Also discussed the rationale behind Aranesp injections; and the need for Feraheme infusions to support Aranesp.  However he was concerned about Feraheme.  So we will check iron studies today [I ordered them]/previously checked at [facility].  #[place] -please cancel the iron infusion for tomorrow [while awaiting on the iron studies].    #Proceed with Aranesp injections as planned for today.

## 2018-09-26 ENCOUNTER — Inpatient Hospital Stay: Payer: Medicare Other

## 2018-09-27 ENCOUNTER — Telehealth: Payer: Self-pay | Admitting: *Deleted

## 2018-09-27 NOTE — Telephone Encounter (Signed)
Contacted patient. She is aware of the plan of care and test results

## 2018-09-27 NOTE — Telephone Encounter (Signed)
-----   Message from Cammie Sickle, MD sent at 09/25/2018  3:59 PM EDT ----- Cem Kosman/Brooke-please inform patient that her hemoglobin is 9.7; platelets 56.  Continue current Aranesp injection.  hold off IV iron for now.  Follow-up labs/ as planned-GB

## 2018-10-08 ENCOUNTER — Other Ambulatory Visit: Payer: Self-pay

## 2018-10-09 ENCOUNTER — Inpatient Hospital Stay: Payer: Medicare Other

## 2018-10-09 ENCOUNTER — Other Ambulatory Visit: Payer: Self-pay

## 2018-10-09 DIAGNOSIS — D471 Chronic myeloproliferative disease: Secondary | ICD-10-CM

## 2018-10-09 DIAGNOSIS — D469 Myelodysplastic syndrome, unspecified: Secondary | ICD-10-CM | POA: Diagnosis not present

## 2018-10-09 LAB — CBC WITH DIFFERENTIAL/PLATELET
Abs Immature Granulocytes: 0.4 10*3/uL — ABNORMAL HIGH (ref 0.00–0.07)
Basophils Absolute: 0 10*3/uL (ref 0.0–0.1)
Basophils Relative: 0 %
Blasts: 2 %
Eosinophils Absolute: 0 10*3/uL (ref 0.0–0.5)
Eosinophils Relative: 0 %
HCT: 34.9 % — ABNORMAL LOW (ref 36.0–46.0)
Hemoglobin: 10.3 g/dL — ABNORMAL LOW (ref 12.0–15.0)
Lymphocytes Relative: 29 %
Lymphs Abs: 3.7 10*3/uL (ref 0.7–4.0)
MCH: 23.1 pg — ABNORMAL LOW (ref 26.0–34.0)
MCHC: 29.5 g/dL — ABNORMAL LOW (ref 30.0–36.0)
MCV: 78.3 fL — ABNORMAL LOW (ref 80.0–100.0)
Metamyelocytes Relative: 2 %
Monocytes Absolute: 1.5 10*3/uL — ABNORMAL HIGH (ref 0.1–1.0)
Monocytes Relative: 12 %
Myelocytes: 1 %
Neutro Abs: 6.9 10*3/uL (ref 1.7–7.7)
Neutrophils Relative %: 54 %
Platelets: 49 10*3/uL — ABNORMAL LOW (ref 150–400)
RBC: 4.46 MIL/uL (ref 3.87–5.11)
RDW: 17.6 % — ABNORMAL HIGH (ref 11.5–15.5)
Smear Review: NORMAL
WBC: 12.8 10*3/uL — ABNORMAL HIGH (ref 4.0–10.5)
nRBC: 0.2 % (ref 0.0–0.2)

## 2018-10-09 LAB — BASIC METABOLIC PANEL
Anion gap: 10 (ref 5–15)
BUN: 21 mg/dL (ref 8–23)
CO2: 27 mmol/L (ref 22–32)
Calcium: 9.4 mg/dL (ref 8.9–10.3)
Chloride: 100 mmol/L (ref 98–111)
Creatinine, Ser: 0.77 mg/dL (ref 0.44–1.00)
GFR calc Af Amer: >60 mL/min (ref >60)
GFR calc non Af Amer: >60 mL/min (ref >60)
Glucose, Bld: 122 mg/dL — ABNORMAL HIGH (ref 70–99)
Potassium: 3.4 mmol/L — ABNORMAL LOW (ref 3.5–5.1)
Sodium: 137 mmol/L (ref 135–145)

## 2018-10-10 ENCOUNTER — Inpatient Hospital Stay: Payer: Medicare Other | Attending: Internal Medicine | Admitting: Internal Medicine

## 2018-10-10 ENCOUNTER — Inpatient Hospital Stay: Payer: Medicare Other

## 2018-10-10 ENCOUNTER — Other Ambulatory Visit: Payer: Self-pay

## 2018-10-10 DIAGNOSIS — D469 Myelodysplastic syndrome, unspecified: Secondary | ICD-10-CM | POA: Diagnosis present

## 2018-10-10 DIAGNOSIS — D696 Thrombocytopenia, unspecified: Secondary | ICD-10-CM

## 2018-10-10 DIAGNOSIS — Z79899 Other long term (current) drug therapy: Secondary | ICD-10-CM | POA: Diagnosis not present

## 2018-10-10 MED ORDER — POLYSACCHARIDE IRON COMPLEX 150 MG PO CAPS: 150.0000 mg | ORAL_CAPSULE | Freq: Every day | ORAL | 1 refills | Status: DC

## 2018-10-10 NOTE — Progress Notes (Signed)
[facility] CONSULT NOTE  Patient Care Team: Rusty Aus, MD as PCP - General (Internal Medicine)  CHIEF COMPLAINTS/PURPOSE OF CONSULTATION:    Oncology History   # SEP 2017- MYELOPROLIFERATIVE NEOPLASM- [WBC- 22; normal Hb/platelets] hypercellular bone marrow 90-95% proliferation of myeloid cells in various stages of maturation; relative erythroid hypoplasia and proliferation of atypical megakaryocytes; no increase in blasts; peripheral blood Bcr-Abl-NEG; Cytogenetics- WNL. NEG- Jak-2/MPL/CALR [place] limited- mild splenomegaly [~10cm; 463cm3]; OCT 2017- second opinion at [facility]. Surveillance.   # With progressive leukocytosis we evaluated her a month ago and sent BM for exam. She has a progressive leukocytosis so hydrea 500mg  daily was started on 10/20/2016 and increased to 2gm daily then back down to one daily, then 5 days per week over time due to counts drop. Then we held her hydrea since 04/09/2018 due to continued declining Hb and Plt.s  BMB 06/24/18 showed markedly hypercellular BM 95% with myeloid hyperplasia and atypical megakaryocytic hyperplasia. No significant increase in blasts. 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 BCR / ABL negative, FISH normal. Flow Showed 2%CD34-positive myeloid blasts. Myeloid precursors with low side scatter. Pathogenic variants were detected in the ASXL1, CCND2, CUX1, and U2AF1 genes.  # March 2020- wbc- 14/ Hb 9.5/ platelets- 54.   ---------------------------------------------------     # 2002 [[place]] BREAST CA s/p Lumpect RT; [? Stage I] no chemo s/p AI      MDS (myelodysplastic syndrome) ([place])   08/28/2018 Initial Diagnosis    MDS (myelodysplastic syndrome) ([facility])      HISTORY OF PRESENTING ILLNESS:  Kathryn Hahn 85 y.o.  female with MDS/MPN is here for follow-up/patient is currently on Aranesp for her anemia.  Denies any ongoing gum bleeding or nosebleeds.   Denies any headaches nausea vomiting.  Denies any bone pain.  Mild fatigue.  Review of Systems  Constitutional: Positive for malaise/fatigue. Negative for chills, diaphoresis, fever and weight loss.  HENT: Negative for nosebleeds and sore throat.   Eyes: Negative for double vision.  Respiratory: Negative for cough, hemoptysis, sputum production, shortness of breath and wheezing.   Cardiovascular: Negative for chest pain, palpitations, orthopnea and leg swelling.  Gastrointestinal: Negative for abdominal pain, blood in stool, constipation, diarrhea, heartburn, melena, nausea and vomiting.  Genitourinary: Negative for dysuria, frequency and urgency.  Musculoskeletal: Negative for back pain and joint pain.  Skin: Negative.  Negative for itching and rash.  Neurological: Negative for dizziness, tingling, focal weakness, weakness and headaches.  Endo/Heme/Allergies: Does not bruise/bleed easily.  Psychiatric/Behavioral: Negative for depression. The patient is not nervous/anxious and does not have insomnia.        MEDICAL HISTORY:  Past Medical History:  Diagnosis Date  . Anemia   . Arthritis   . Breast cancer ([place]) 2003   RT LUMPECTOMY  . Edema leg   . History of breast cancer 2003   post lumpectomy  . History of kidney stones   . Hyperlipemia, mixed   . Hypertension, essential   . Leukocytosis   . Personal history of radiation therapy 2003   BREAST CA  . Renal stones   . Vitamin D deficiency     SURGICAL HISTORY: Past Surgical History:  Procedure Laterality Date  . BREAST BIOPSY Right 2003   Postive for cancer  . BREAST LUMPECTOMY Right 2003   BREAST CA  . KIDNEY STONE SURGERY Right     SOCIAL HISTORY: Social History   Socioeconomic History  . Marital status: Married  Spouse name: Not on file  . Number of children: Not on file  . Years of education: Not on file  . Highest education level: Not on file  Occupational History  . Not on file  Social Needs  .  Financial resource strain: Not on file  . Food insecurity:    Worry: Not on file    Inability: Not on file  . Transportation needs:    Medical: Not on file    Non-medical: Not on file  Tobacco Use  . Smoking status: Never Smoker  . Smokeless tobacco: Never Used  Substance and Sexual Activity  . Alcohol use: Yes    Alcohol/week: 3.0 standard drinks    Types: 3 Glasses of wine per week  . Drug use: No  . Sexual activity: Not on file  Lifestyle  . Physical activity:    Days per week: Not on file    Minutes per session: Not on file  . Stress: Not on file  Relationships  . Social connections:    Talks on phone: Not on file    Gets together: Not on file    Attends religious service: Not on file    Active member of club or organization: Not on file    Attends meetings of clubs or organizations: Not on file    Relationship status: Not on file  . Intimate partner violence:    Fear of current or ex partner: Not on file    Emotionally abused: Not on file    Physically abused: Not on file    Forced sexual activity: Not on file  Other Topics Concern  . Not on file  Social History Narrative    No smoking/alcohol; lives in [place] with family.  She lives in assisted living with her husband.    FAMILY HISTORY: Family History  Problem Relation Age of Onset  . Stroke Mother   . Hypertension Father   . Stroke Father   . Breast cancer Neg Hx     ALLERGIES:  is allergic to terbinafine.  MEDICATIONS:  Current Outpatient Medications  Medication Sig Dispense Refill  . atenolol (TENORMIN) 50 MG tablet Take 1 tablet by mouth 2 (two) times daily.    Marland Kitchen CALCIUM-VITAMIN D PO Take by mouth.    . Cholecalciferol (VITAMIN D) 2000 units tablet Take 1 tablet by mouth daily.    Marland Kitchen losartan-hydrochlorothiazide (HYZAAR) 50-12.5 MG tablet Take 1 tablet by mouth daily.    . Omega-3 Fatty Acids (FISH OIL PO) Take 1 capsule by mouth daily.    . vitamin C (ASCORBIC ACID) 500 MG tablet Take 1 tablet  by mouth 2 (two) times daily.    . iron polysaccharides (FERREX 150) 150 MG capsule Take 1 capsule (150 mg total) by mouth daily. 90 capsule 1   No current facility-administered medications for this visit.       Marland Kitchen  PHYSICAL EXAMINATION: ECOG PERFORMANCE STATUS: 0 - Asymptomatic  Vitals:   10/10/18 1145  BP: (!) 156/79  Pulse: 62  Resp: 18  Temp: (!) 97.4 F (36.3 C)   Filed Weights   10/10/18 1012  Weight: 99 lb 12.8 oz (45.3 kg)    Physical Exam  Constitutional: She is oriented to person, place, and time and well-developed, well-nourished, and in no distress.  HENT:  Head: Normocephalic and atraumatic.  Mouth/Throat: Oropharynx is clear and moist. No oropharyngeal exudate.  Eyes: Pupils are equal, round, and reactive to light.  Neck: Normal range of motion. Neck supple.  Cardiovascular: Normal rate and regular rhythm.  Pulmonary/Chest: No respiratory distress. She has no wheezes.  Abdominal: Soft. Bowel sounds are normal. She exhibits no distension and no mass. There is no abdominal tenderness. There is no rebound and no guarding.  Musculoskeletal: Normal range of motion.        General: No tenderness or edema.  Neurological: She is alert and oriented to person, place, and time.  Skin: Skin is warm.  Psychiatric: Affect normal.  ;   LABORATORY DATA:  I have reviewed the data as listed Lab Results  Component Value Date   WBC 12.8 (H) 10/09/2018   HGB 10.3 (L) 10/09/2018   HCT 34.9 (L) 10/09/2018   MCV 78.3 (L) 10/09/2018   PLT 49 (L) 10/09/2018   Recent Labs    09/11/18 1025 09/25/18 1028 10/09/18 1039  NA 135 136 137  K 3.9 3.6 3.4*  CL 98 99 100  CO2 30 31 27   GLUCOSE 91 100* 122*  BUN 21 23 21   CREATININE 0.58 0.69 0.77  CALCIUM 9.5 9.5 9.4  GFRNONAA >60 >60 >60  GFRAA >60 >60 >60    RADIOGRAPHIC STUDIES: I have personally reviewed the radiological images as listed and agreed with the findings in the report. No results found.  ASSESSMENT &  PLAN:   MDS/MPN (myelodysplastic/myeloproliferative neoplasms) ([place]) #MPN/MDS unclassified-currently on Aranesp.  Hemoglobin 10.3; platelets 49.  Continue Aranesp to keep hemoglobin over 10.  We will continue to hold Vidaza given the COVID pandemic.  #Thrombocytopenia-49 platelets today slowly trending down.  Asymptomatic.  Monitor for now.  #Microcytosis-borderline iron studies-saturation 22% ferritin 80.  Patient declines IV iron.  Continue p.o. iron.  Discussed with patient's brother who is a retired Engineer, drilling.  # DISPOSITION; # in 2 weeks- labs- cbc/possible aranesp # June 1st- MD clinic- cbc/bmp- aranesp-Dr.B  # 25 minutes face-to-face with the patient discussing the above plan of care; more than 50% of time spent on prognosis/ natural history; counseling and coordination.       Cammie Sickle, MD 10/14/2018 8:34 AM

## 2018-10-10 NOTE — Assessment & Plan Note (Addendum)
#  MPN/MDS unclassified-currently on Aranesp.  Hemoglobin 10.3; platelets 49.  Continue Aranesp to keep hemoglobin over 10.  We will continue to hold Vidaza given the COVID pandemic.  #Thrombocytopenia-49 platelets today slowly trending down.  Asymptomatic.  Monitor for now.  #Microcytosis-borderline iron studies-saturation 22% ferritin 80.  Patient declines IV iron.  Continue p.o. iron.  Discussed with patient's brother who is a retired Engineer, drilling.  # DISPOSITION; # in 2 weeks- labs- cbc/possible aranesp # June 1st- MD clinic- cbc/bmp- aranesp-Dr.B  # 25 minutes face-to-face with the patient discussing the above plan of care; more than 50% of time spent on prognosis/ natural history; counseling and coordination.

## 2018-10-23 ENCOUNTER — Other Ambulatory Visit: Payer: Self-pay

## 2018-10-24 ENCOUNTER — Inpatient Hospital Stay: Payer: Medicare Other | Attending: Internal Medicine

## 2018-10-24 ENCOUNTER — Other Ambulatory Visit: Payer: Self-pay

## 2018-10-24 ENCOUNTER — Inpatient Hospital Stay: Payer: Medicare Other

## 2018-10-24 DIAGNOSIS — D649 Anemia, unspecified: Secondary | ICD-10-CM | POA: Diagnosis not present

## 2018-10-24 DIAGNOSIS — D469 Myelodysplastic syndrome, unspecified: Secondary | ICD-10-CM

## 2018-10-24 LAB — CBC WITH DIFFERENTIAL/PLATELET
Abs Immature Granulocytes: 3.7 10*3/uL — ABNORMAL HIGH (ref 0.00–0.07)
Band Neutrophils: 3 %
Basophils Absolute: 0 10*3/uL (ref 0.0–0.1)
Basophils Relative: 0 %
Blasts: 3 %
Eosinophils Absolute: 0 10*3/uL (ref 0.0–0.5)
Eosinophils Relative: 0 %
HCT: 33.5 % — ABNORMAL LOW (ref 36.0–46.0)
Hemoglobin: 10 g/dL — ABNORMAL LOW (ref 12.0–15.0)
Lymphocytes Relative: 21 %
Lymphs Abs: 5.2 10*3/uL — ABNORMAL HIGH (ref 0.7–4.0)
MCH: 22.7 pg — ABNORMAL LOW (ref 26.0–34.0)
MCHC: 29.9 g/dL — ABNORMAL LOW (ref 30.0–36.0)
MCV: 76.1 fL — ABNORMAL LOW (ref 80.0–100.0)
Metamyelocytes Relative: 5 %
Monocytes Absolute: 0.7 10*3/uL (ref 0.1–1.0)
Monocytes Relative: 3 %
Myelocytes: 8 %
Neutro Abs: 14.4 10*3/uL — ABNORMAL HIGH (ref 1.7–7.7)
Neutrophils Relative %: 55 %
Platelets: 51 10*3/uL — ABNORMAL LOW (ref 150–400)
Promyelocytes Relative: 2 %
RBC: 4.4 MIL/uL (ref 3.87–5.11)
RDW: 17.4 % — ABNORMAL HIGH (ref 11.5–15.5)
Smear Review: DECREASED
WBC: 24.8 10*3/uL — ABNORMAL HIGH (ref 4.0–10.5)
nRBC: 0.2 % (ref 0.0–0.2)

## 2018-11-06 ENCOUNTER — Other Ambulatory Visit: Payer: Self-pay | Admitting: Internal Medicine

## 2018-11-06 DIAGNOSIS — Z1231 Encounter for screening mammogram for malignant neoplasm of breast: Secondary | ICD-10-CM

## 2018-11-11 ENCOUNTER — Encounter: Payer: Self-pay | Admitting: Internal Medicine

## 2018-11-11 ENCOUNTER — Inpatient Hospital Stay: Payer: Medicare Other

## 2018-11-11 ENCOUNTER — Inpatient Hospital Stay: Payer: Medicare Other | Attending: Internal Medicine

## 2018-11-11 ENCOUNTER — Other Ambulatory Visit: Payer: Self-pay

## 2018-11-11 ENCOUNTER — Inpatient Hospital Stay (HOSPITAL_BASED_OUTPATIENT_CLINIC_OR_DEPARTMENT_OTHER): Payer: Medicare Other | Admitting: Internal Medicine

## 2018-11-11 VITALS — BP 125/76 | HR 76 | Temp 98.5°F | Resp 18 | Ht <= 58 in | Wt 99.3 lb

## 2018-11-11 DIAGNOSIS — I10 Essential (primary) hypertension: Secondary | ICD-10-CM | POA: Insufficient documentation

## 2018-11-11 DIAGNOSIS — C946 Myelodysplastic disease, not classified: Secondary | ICD-10-CM | POA: Insufficient documentation

## 2018-11-11 DIAGNOSIS — Z853 Personal history of malignant neoplasm of breast: Secondary | ICD-10-CM | POA: Diagnosis not present

## 2018-11-11 DIAGNOSIS — Z79899 Other long term (current) drug therapy: Secondary | ICD-10-CM | POA: Insufficient documentation

## 2018-11-11 DIAGNOSIS — D649 Anemia, unspecified: Secondary | ICD-10-CM | POA: Diagnosis not present

## 2018-11-11 DIAGNOSIS — D469 Myelodysplastic syndrome, unspecified: Secondary | ICD-10-CM | POA: Diagnosis present

## 2018-11-11 DIAGNOSIS — C9200 Acute myeloblastic leukemia, not having achieved remission: Secondary | ICD-10-CM

## 2018-11-11 LAB — BASIC METABOLIC PANEL
Anion gap: 10 (ref 5–15)
BUN: 30 mg/dL — ABNORMAL HIGH (ref 8–23)
CO2: 29 mmol/L (ref 22–32)
Calcium: 10 mg/dL (ref 8.9–10.3)
Chloride: 98 mmol/L (ref 98–111)
Creatinine, Ser: 0.88 mg/dL (ref 0.44–1.00)
GFR calc Af Amer: >60 mL/min (ref >60)
GFR calc non Af Amer: >60 mL/min (ref >60)
Glucose, Bld: 125 mg/dL — ABNORMAL HIGH (ref 70–99)
Potassium: 3.7 mmol/L (ref 3.5–5.1)
Sodium: 137 mmol/L (ref 135–145)

## 2018-11-11 LAB — CBC WITH DIFFERENTIAL/PLATELET
Abs Immature Granulocytes: 1.8 10*3/uL — ABNORMAL HIGH (ref 0.00–0.07)
Band Neutrophils: 4 %
Basophils Absolute: 0 10*3/uL (ref 0.0–0.1)
Basophils Relative: 0 %
Blasts: 11 %
Eosinophils Absolute: 0.4 10*3/uL (ref 0.0–0.5)
Eosinophils Relative: 1 %
HCT: 29.2 % — ABNORMAL LOW (ref 36.0–46.0)
Hemoglobin: 8.8 g/dL — ABNORMAL LOW (ref 12.0–15.0)
Lymphocytes Relative: 10 %
Lymphs Abs: 3.6 10*3/uL (ref 0.7–4.0)
MCH: 22.9 pg — ABNORMAL LOW (ref 26.0–34.0)
MCHC: 30.1 g/dL (ref 30.0–36.0)
MCV: 75.8 fL — ABNORMAL LOW (ref 80.0–100.0)
Metamyelocytes Relative: 4 %
Monocytes Absolute: 5.7 10*3/uL — ABNORMAL HIGH (ref 0.1–1.0)
Monocytes Relative: 16 %
Myelocytes: 1 %
Neutro Abs: 20.3 10*3/uL — ABNORMAL HIGH (ref 1.7–7.7)
Neutrophils Relative %: 53 %
Platelets: 43 10*3/uL — ABNORMAL LOW (ref 150–400)
RBC: 3.85 MIL/uL — ABNORMAL LOW (ref 3.87–5.11)
RDW: 18.1 % — ABNORMAL HIGH (ref 11.5–15.5)
Smear Review: NORMAL
WBC: 35.6 10*3/uL — ABNORMAL HIGH (ref 4.0–10.5)
nRBC: 0.5 % — ABNORMAL HIGH (ref 0.0–0.2)

## 2018-11-11 LAB — PATHOLOGIST SMEAR REVIEW

## 2018-11-11 MED ORDER — DARBEPOETIN ALFA 300 MCG/0.6ML IJ SOSY
300.0000 ug | PREFILLED_SYRINGE | Freq: Once | INTRAMUSCULAR | Status: AC
  Administered 2018-11-11: 300 ug via SUBCUTANEOUS
  Filled 2018-11-11: qty 0.6

## 2018-11-11 MED ORDER — MAGIC MOUTHWASH W/LIDOCAINE: 5.0000 mL | Freq: Four times a day (QID) | ORAL | 3 refills | Status: DC | PRN

## 2018-11-11 NOTE — Assessment & Plan Note (Addendum)
#  MPN/MDS unclassified-currently on Aranesp.  Hemoglobin 8.8; platelets 43.  Progression noted on a clinical basis with worsening platelet count and also blast count up to 12%.  Discussed with pathology.   #  Continue Aranesp to keep hemoglobin over 10.  Continue p.o. iron.   #Worsening thrombocytopenia/anemia-given increasing blasts recommend to start Vidaza day 1-5 subcu. reviewed the potential side effects; understand treatments are palliative not curative.  After lengthy discussion weighing risk versus benefits patient agrees to proceed with treatment as discussed above.  Chemotherapy education.  Discussed that she will likely need weekly blood counts.  And also possible transfusions.  #Microcytosis-borderline iron studies-saturation 22% ferritin 80.  Declines IV iron continue p.o. iron.  #Sores in the mouth recommend Magic mouthwash.  # DISPOSITION:  # Aranesp today.  # chemo ed in 1 week.  # in 2 weeks-MD- labs- cbc/cmp/LDH;- new SQ Vidaza days- 1 thru 5. Dr.B

## 2018-11-11 NOTE — Progress Notes (Signed)
[facility] CONSULT NOTE  Patient Care Team: Rusty Aus, MD as PCP - General (Internal Medicine)  CHIEF COMPLAINTS/PURPOSE OF CONSULTATION:    Oncology History   # SEP 2017- MYELOPROLIFERATIVE NEOPLASM- [WBC- 64; normal Hb/platelets] hypercellular bone marrow 90-95% proliferation of myeloid cells in various stages of maturation; relative erythroid hypoplasia and proliferation of atypical megakaryocytes; no increase in blasts; peripheral blood Bcr-Abl-NEG; Cytogenetics- WNL. NEG- Jak-2/MPL/CALR [place] limited- mild splenomegaly [~10cm; 463cm3]; OCT 2017- second opinion at [facility]. Surveillance.   # With progressive leukocytosis we evaluated her a month ago and sent BM for exam. She has a progressive leukocytosis so hydrea 500mg  daily was started on 10/20/2016 and increased to 2gm daily then back down to one daily, then 5 days per week over time due to counts drop. Then we held her hydrea since 04/09/2018 due to continued declining Hb and Plt.s  BMB 06/24/18 showed markedly hypercellular BM 95% with myeloid hyperplasia and atypical megakaryocytic hyperplasia. No significant increase in blasts. 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 BCR / ABL negative, FISH normal. Flow Showed 2%CD34-positive myeloid blasts. Myeloid precursors with low side scatter. Pathogenic variants were detected in the ASXL1, CCND2, CUX1, and U2AF1 genes.  # March 2020- wbc- 14/ Hb 9.5/ platelets- 54.   ---------------------------------------------------     # 2002 [[place]] BREAST CA s/p Lumpect RT; [? Stage I] no chemo s/p AI      MDS (myelodysplastic syndrome) ([place])   08/28/2018 Initial Diagnosis    MDS (myelodysplastic syndrome) ([facility])      HISTORY OF PRESENTING ILLNESS:  Kathryn Hahn 85 y.o.  female with MDS/MPN is here for follow-up/patient is currently on Aranesp for her anemia.  Patient complains of intermittent soreness in the  mouth.  Denies any nausea vomiting.  Denies any epistaxis.  Complains of ongoing fatigue.  No blood in stools or black stools.   Review of Systems  Constitutional: Positive for malaise/fatigue. Negative for chills, diaphoresis, fever and weight loss.  HENT: Negative for nosebleeds and sore throat.   Eyes: Negative for double vision.  Respiratory: Negative for cough, hemoptysis, sputum production, shortness of breath and wheezing.   Cardiovascular: Negative for chest pain, palpitations, orthopnea and leg swelling.  Gastrointestinal: Negative for abdominal pain, blood in stool, constipation, diarrhea, heartburn, melena, nausea and vomiting.  Genitourinary: Negative for dysuria, frequency and urgency.  Musculoskeletal: Negative for back pain and joint pain.  Skin: Negative.  Negative for itching and rash.  Neurological: Negative for dizziness, tingling, focal weakness, weakness and headaches.  Endo/Heme/Allergies: Does not bruise/bleed easily.  Psychiatric/Behavioral: Negative for depression. The patient is not nervous/anxious and does not have insomnia.        MEDICAL HISTORY:  Past Medical History:  Diagnosis Date  . Anemia   . Arthritis   . Breast cancer ([place]) 2003   RT LUMPECTOMY  . Edema leg   . History of breast cancer 2003   post lumpectomy  . History of kidney stones   . Hyperlipemia, mixed   . Hypertension, essential   . Leukocytosis   . Personal history of radiation therapy 2003   BREAST CA  . Renal stones   . Vitamin D deficiency     SURGICAL HISTORY: Past Surgical History:  Procedure Laterality Date  . BREAST BIOPSY Right 2003   Postive for cancer  . BREAST LUMPECTOMY Right 2003   BREAST CA  . KIDNEY STONE SURGERY Right     SOCIAL HISTORY: Social History  Socioeconomic History  . Marital status: Married    Spouse name: Not on file  . Number of children: Not on file  . Years of education: Not on file  . Highest education level: Not on file   Occupational History  . Not on file  Social Needs  . Financial resource strain: Not on file  . Food insecurity:    Worry: Not on file    Inability: Not on file  . Transportation needs:    Medical: Not on file    Non-medical: Not on file  Tobacco Use  . Smoking status: Never Smoker  . Smokeless tobacco: Never Used  Substance and Sexual Activity  . Alcohol use: Yes    Alcohol/week: 3.0 standard drinks    Types: 3 Glasses of wine per week  . Drug use: No  . Sexual activity: Not on file  Lifestyle  . Physical activity:    Days per week: Not on file    Minutes per session: Not on file  . Stress: Not on file  Relationships  . Social connections:    Talks on phone: Not on file    Gets together: Not on file    Attends religious service: Not on file    Active member of club or organization: Not on file    Attends meetings of clubs or organizations: Not on file    Relationship status: Not on file  . Intimate partner violence:    Fear of current or ex partner: Not on file    Emotionally abused: Not on file    Physically abused: Not on file    Forced sexual activity: Not on file  Other Topics Concern  . Not on file  Social History Narrative    No smoking/alcohol; lives in [place] with family.  She lives in assisted living with her husband.    FAMILY HISTORY: Family History  Problem Relation Age of Onset  . Stroke Mother   . Hypertension Father   . Stroke Father   . Breast cancer Neg Hx     ALLERGIES:  is allergic to terbinafine.  MEDICATIONS:  Current Outpatient Medications  Medication Sig Dispense Refill  . atenolol (TENORMIN) 50 MG tablet Take 1 tablet by mouth 2 (two) times daily.    Marland Kitchen CALCIUM-VITAMIN D PO Take 1 tablet by mouth daily.     . Cholecalciferol (VITAMIN D) 2000 units tablet Take 1 tablet by mouth daily.    . iron polysaccharides (FERREX 150) 150 MG capsule Take 1 capsule (150 mg total) by mouth daily. 90 capsule 1  . losartan-hydrochlorothiazide  (HYZAAR) 50-12.5 MG tablet Take 1 tablet by mouth daily.    . Omega-3 Fatty Acids (FISH OIL PO) Take 1 capsule by mouth daily.    . vitamin C (ASCORBIC ACID) 500 MG tablet Take 1 tablet by mouth 2 (two) times daily.    . magic mouthwash w/lidocaine SOLN Take 5 mLs by mouth 4 (four) times daily as needed for mouth pain. 480 mL 3   No current facility-administered medications for this visit.       Marland Kitchen  PHYSICAL EXAMINATION: ECOG PERFORMANCE STATUS: 0 - Asymptomatic  Vitals:   11/11/18 0954  BP: 125/76  Pulse: 76  Resp: 18  Temp: 98.5 F (36.9 C)   Filed Weights   11/11/18 0954  Weight: 99 lb 4.8 oz (45 kg)    Physical Exam  Constitutional: She is oriented to person, place, and time and well-developed, well-nourished, and in no distress.  HENT:  Head: Normocephalic and atraumatic.  Mouth/Throat: Oropharynx is clear and moist. No oropharyngeal exudate.  Eyes: Pupils are equal, round, and reactive to light.  Neck: Normal range of motion. Neck supple.  Cardiovascular: Normal rate and regular rhythm.  Pulmonary/Chest: No respiratory distress. She has no wheezes.  Abdominal: Soft. Bowel sounds are normal. She exhibits no distension and no mass. There is no abdominal tenderness. There is no rebound and no guarding.  Musculoskeletal: Normal range of motion.        General: No tenderness or edema.  Neurological: She is alert and oriented to person, place, and time.  Skin: Skin is warm.  Psychiatric: Affect normal.  ;   LABORATORY DATA:  I have reviewed the data as listed Lab Results  Component Value Date   WBC 35.6 (H) 11/11/2018   HGB 8.8 (L) 11/11/2018   HCT 29.2 (L) 11/11/2018   MCV 75.8 (L) 11/11/2018   PLT 43 (L) 11/11/2018   Recent Labs    09/25/18 1028 10/09/18 1039 11/11/18 0939  NA 136 137 137  K 3.6 3.4* 3.7  CL 99 100 98  CO2 31 27 29   GLUCOSE 100* 122* 125*  BUN 23 21 30*  CREATININE 0.69 0.77 0.88  CALCIUM 9.5 9.4 10.0  GFRNONAA >60 >60 >60  GFRAA  >60 >60 >60    RADIOGRAPHIC STUDIES: I have personally reviewed the radiological images as listed and agreed with the findings in the report. No results found.  ASSESSMENT & PLAN:   MDS/MPN (myelodysplastic/myeloproliferative neoplasms) ([place]) #MPN/MDS unclassified-currently on Aranesp.  Hemoglobin 8.8; platelets 43.  Progression noted on a clinical basis with worsening platelet count and also blast count up to 12%.  Discussed with pathology.   #  Continue Aranesp to keep hemoglobin over 10.  Continue p.o. iron.   #Worsening thrombocytopenia/anemia-given increasing blasts recommend to start Vidaza day 1-5 subcu. reviewed the potential side effects; understand treatments are palliative not curative.  After lengthy discussion weighing risk versus benefits patient agrees to proceed with treatment as discussed above.  Chemotherapy education.  Discussed that she will likely need weekly blood counts.  And also possible transfusions.  #Microcytosis-borderline iron studies-saturation 22% ferritin 80.  Declines IV iron continue p.o. iron.  #Sores in the mouth recommend Magic mouthwash.  # DISPOSITION:  # Aranesp today.  # chemo ed in 1 week.  # in 2 weeks-MD- labs- cbc/cmp/LDH;- new SQ Vidaza days- 1 thru 5. Dr.B        Cammie Sickle, MD 11/11/2018 1:05 PM

## 2018-11-12 ENCOUNTER — Telehealth: Payer: Self-pay

## 2018-11-12 ENCOUNTER — Telehealth: Payer: Self-pay | Admitting: *Deleted

## 2018-11-12 NOTE — Telephone Encounter (Signed)
I can refax this. I have confirmation that the fax went through yesterday.

## 2018-11-12 NOTE — Telephone Encounter (Signed)
  Spoke with patient over telephone and patient in agreement to do the chemotherapy teaching over the telephone on Monday June 8th at 9:00 am.  Emailed all the teaching information today.

## 2018-11-12 NOTE — Telephone Encounter (Signed)
Script refaxed. Fax confirmation - once again received.

## 2018-11-12 NOTE — Patient Instructions (Signed)
Azacitidine suspension for injection (subcutaneous use)  What is this medicine?  AZACITIDINE (ay za SITE i deen) is a chemotherapy drug. This medicine reduces the growth of cancer cells and can suppress the immune system. It is used for treating myelodysplastic syndrome or some types of leukemia.  This medicine may be used for other purposes; ask your health care provider or pharmacist if you have questions.  COMMON BRAND NAME(S): Vidaza  What should I tell my health care provider before I take this medicine?  They need to know if you have any of these conditions:  -kidney disease  -liver disease  -liver tumors  -an unusual or allergic reaction to azacitidine, mannitol, other medicines, foods, dyes, or preservatives  -pregnant or trying to get pregnant  -breast-feeding  How should I use this medicine?  This medicine is for injection under the skin. It is administered in a hospital or clinic by a specially trained health care professional.  Talk to your pediatrician regarding the use of this medicine in children. While this drug may be prescribed for selected conditions, precautions do apply.  Overdosage: If you think you have taken too much of this medicine contact a poison control center or emergency room at once.  NOTE: This medicine is only for you. Do not share this medicine with others.  What if I miss a dose?  It is important not to miss your dose. Call your doctor or health care professional if you are unable to keep an appointment.  What may interact with this medicine?  Interactions have not been studied.  Give your health care provider a list of all the medicines, herbs, non-prescription drugs, or dietary supplements you use. Also tell them if you smoke, drink alcohol, or use illegal drugs. Some items may interact with your medicine.  This list may not describe all possible interactions. Give your health care provider a list of all the medicines, herbs, non-prescription drugs, or dietary supplements you  use. Also tell them if you smoke, drink alcohol, or use illegal drugs. Some items may interact with your medicine.  What should I watch for while using this medicine?  Visit your doctor for checks on your progress. This drug may make you feel generally unwell. This is not uncommon, as chemotherapy can affect healthy cells as well as cancer cells. Report any side effects. Continue your course of treatment even though you feel ill unless your doctor tells you to stop.  In some cases, you may be given additional medicines to help with side effects. Follow all directions for their use.  Call your doctor or health care professional for advice if you get a fever, chills or sore throat, or other symptoms of a cold or flu. Do not treat yourself. This drug decreases your body's ability to fight infections. Try to avoid being around people who are sick.  This medicine may increase your risk to bruise or bleed. Call your doctor or health care professional if you notice any unusual bleeding.  You may need blood work done while you are taking this medicine.  Do not become pregnant while taking this medicine and for 6 months after the last dose. Women should inform their doctor if they wish to become pregnant or think they might be pregnant. Men should not father a child while taking this medicine and for 3 months after the last dose. There is a potential for serious side effects to an unborn child. Talk to your health care professional or pharmacist for   more information. Do not breast-feed an infant while taking this medicine and for 1 week after the last dose.  This medicine may interfere with the ability to have a child. Talk with your doctor or health care professional if you are concerned about your fertility.  What side effects may I notice from receiving this medicine?  Side effects that you should report to your doctor or health care professional as soon as possible:  -allergic reactions like skin rash, itching or hives,  swelling of the face, lips, or tongue  -low blood counts - this medicine may decrease the number of white blood cells, red blood cells and platelets. You may be at increased risk for infections and bleeding.  -signs of infection - fever or chills, cough, sore throat, pain passing urine  -signs of decreased platelets or bleeding - bruising, pinpoint red spots on the skin, black, tarry stools, blood in the urine  -signs of decreased red blood cells - unusually weak or tired, fainting spells, lightheadedness  -signs and symptoms of kidney injury like trouble passing urine or change in the amount of urine  -signs and symptoms of liver injury like dark yellow or brown urine; general ill feeling or flu-like symptoms; light-colored stools; loss of appetite; nausea; right upper belly pain; unusually weak or tired; yellowing of the eyes or skin  Side effects that usually do not require medical attention (report to your doctor or health care professional if they continue or are bothersome):  -constipation  -diarrhea  -nausea, vomiting  -pain or redness at the injection site  -unusually weak or tired  This list may not describe all possible side effects. Call your doctor for medical advice about side effects. You may report side effects to FDA at 1-800-FDA-1088.  Where should I keep my medicine?  This drug is given in a hospital or clinic and will not be stored at home.  NOTE: This sheet is a summary. It may not cover all possible information. If you have questions about this medicine, talk to your doctor, pharmacist, or health care provider.   2019 Elsevier/Gold Standard (2016-06-27 14:37:51)

## 2018-11-12 NOTE — Telephone Encounter (Signed)
[facility] [address]

## 2018-11-12 NOTE — Telephone Encounter (Signed)
I also sent a copy of these fax confirmations to medical records fax [phone] to be scanned into the patient's chart.

## 2018-11-12 NOTE — Telephone Encounter (Signed)
Per husband patient was to have prescription for Magic Mouth Wash with lidocaine faxed to pharmacy and t has not been received by pharmacy yet.

## 2018-11-13 ENCOUNTER — Other Ambulatory Visit: Payer: Self-pay | Admitting: *Deleted

## 2018-11-13 NOTE — Telephone Encounter (Signed)
error 

## 2018-11-15 ENCOUNTER — Other Ambulatory Visit: Payer: Self-pay

## 2018-11-18 ENCOUNTER — Inpatient Hospital Stay: Payer: Medicare Other

## 2018-11-18 ENCOUNTER — Other Ambulatory Visit: Payer: Self-pay | Admitting: *Deleted

## 2018-11-18 DIAGNOSIS — Z01812 Encounter for preprocedural laboratory examination: Secondary | ICD-10-CM

## 2018-11-18 DIAGNOSIS — D469 Myelodysplastic syndrome, unspecified: Secondary | ICD-10-CM

## 2018-11-19 ENCOUNTER — Other Ambulatory Visit: Payer: Self-pay | Admitting: Internal Medicine

## 2018-11-19 ENCOUNTER — Telehealth: Payer: Self-pay | Admitting: Pharmacist

## 2018-11-19 ENCOUNTER — Telehealth: Payer: Self-pay | Admitting: Internal Medicine

## 2018-11-19 DIAGNOSIS — D469 Myelodysplastic syndrome, unspecified: Secondary | ICD-10-CM

## 2018-11-19 MED ORDER — VENETOCLAX 100 MG PO TABS: 400.0000 mg | ORAL_TABLET | Freq: Every day | ORAL | 0 refills | Status: DC

## 2018-11-19 MED ORDER — ALLOPURINOL 300 MG PO TABS: 300.0000 mg | ORAL_TABLET | Freq: Two times a day (BID) | ORAL | 0 refills | Status: DC

## 2018-11-19 NOTE — Progress Notes (Signed)
Kathryn Hahn- Allopurinol sent; please inform pt when you get to met her in AM. Thanks.

## 2018-11-19 NOTE — Telephone Encounter (Signed)
#  Discussed with Dr. Mayme Genta concerns for disease progression.  Recommends NGS/venetoclax.  Discussed with patient.  She is in agreement.  #We will send off NGS testing-foundation 1 [[name]- this needs to be ordered in the computer]; kits expired; will send off when available/next week.   # peripheral blood flow cytometry [this is ordered]/baseline labs to assess tumor lysis [ordered]  #Patient is aware that she will come to the cancer center after her Covid testing [tomorrow 8:30] for blood draw.  # Discussed with Clearnce Sorrel- will send venatoclax script. Also discussed re: possible need for rasburicase.   #

## 2018-11-19 NOTE — Telephone Encounter (Signed)
Oral Oncology Pharmacist Encounter  Received new prescription for Venclexta (venetoclax) for the treatment of high grade MDS/acute leukemia in conjunction with azacitidine, planned duration until disease progression or unacceptable drug toxicity.  CBC/CMP, uric acid, phos, mag, LDH have all been order and will be obtained at baseline prior to the start of venetoclax. Prescription dose and frequency assessed.   Current medication list in Epic reviewed, no DDIs with venetoclax identified.   Prescription has been e-scribed to the [facility] for benefits analysis and approval.  Oral Oncology Clinic will continue to follow for insurance authorization, copayment issues, initial counseling and start date.  Darl Pikes, PharmD, BCPS, [facility] Hematology/Oncology Clinical Pharmacist [facility] [place] [facility] [phone]  11/19/2018 4:22 PM

## 2018-11-20 ENCOUNTER — Telehealth: Payer: Self-pay | Admitting: Pharmacy Technician

## 2018-11-20 ENCOUNTER — Telehealth: Payer: Self-pay | Admitting: Internal Medicine

## 2018-11-20 ENCOUNTER — Inpatient Hospital Stay: Payer: Medicare Other

## 2018-11-20 ENCOUNTER — Other Ambulatory Visit: Payer: Medicare Other

## 2018-11-20 ENCOUNTER — Other Ambulatory Visit: Payer: Self-pay

## 2018-11-20 ENCOUNTER — Encounter: Payer: Self-pay | Admitting: *Deleted

## 2018-11-20 ENCOUNTER — Other Ambulatory Visit: Payer: Self-pay | Admitting: *Deleted

## 2018-11-20 DIAGNOSIS — Z01812 Encounter for preprocedural laboratory examination: Secondary | ICD-10-CM

## 2018-11-20 DIAGNOSIS — D469 Myelodysplastic syndrome, unspecified: Secondary | ICD-10-CM | POA: Diagnosis not present

## 2018-11-20 LAB — CBC WITH DIFFERENTIAL/PLATELET
Abs Immature Granulocytes: 2.8 10*3/uL — ABNORMAL HIGH (ref 0.00–0.07)
Basophils Absolute: 0 10*3/uL (ref 0.0–0.1)
Basophils Relative: 0 %
Blasts: 16 %
Eosinophils Absolute: 0.8 10*3/uL — ABNORMAL HIGH (ref 0.0–0.5)
Eosinophils Relative: 2 %
HCT: 29.5 % — ABNORMAL LOW (ref 36.0–46.0)
Hemoglobin: 8.7 g/dL — ABNORMAL LOW (ref 12.0–15.0)
Lymphocytes Relative: 13 %
Lymphs Abs: 5.2 10*3/uL — ABNORMAL HIGH (ref 0.7–4.0)
MCH: 22.7 pg — ABNORMAL LOW (ref 26.0–34.0)
MCHC: 29.5 g/dL — ABNORMAL LOW (ref 30.0–36.0)
MCV: 76.8 fL — ABNORMAL LOW (ref 80.0–100.0)
Metamyelocytes Relative: 7 %
Monocytes Absolute: 2.8 10*3/uL — ABNORMAL HIGH (ref 0.1–1.0)
Monocytes Relative: 7 %
Neutro Abs: 21.8 10*3/uL — ABNORMAL HIGH (ref 1.7–7.7)
Neutrophils Relative %: 55 %
Platelets: 37 10*3/uL — ABNORMAL LOW (ref 150–400)
RBC: 3.84 MIL/uL — ABNORMAL LOW (ref 3.87–5.11)
RDW: 18.9 % — ABNORMAL HIGH (ref 11.5–15.5)
Smear Review: DECREASED
WBC Morphology: 16
WBC: 39.7 10*3/uL — ABNORMAL HIGH (ref 4.0–10.5)
nRBC: 1.6 % — ABNORMAL HIGH (ref 0.0–0.2)

## 2018-11-20 LAB — COMPREHENSIVE METABOLIC PANEL
ALT: 14 U/L (ref 0–44)
AST: 39 U/L (ref 15–41)
Albumin: 4 g/dL (ref 3.5–5.0)
Alkaline Phosphatase: 65 U/L (ref 38–126)
Anion gap: 10 (ref 5–15)
BUN: 24 mg/dL — ABNORMAL HIGH (ref 8–23)
CO2: 31 mmol/L (ref 22–32)
Calcium: 10.3 mg/dL (ref 8.9–10.3)
Chloride: 99 mmol/L (ref 98–111)
Creatinine, Ser: 0.9 mg/dL (ref 0.44–1.00)
GFR calc Af Amer: >60 mL/min (ref >60)
GFR calc non Af Amer: 60 mL/min — ABNORMAL LOW (ref >60)
Glucose, Bld: 130 mg/dL — ABNORMAL HIGH (ref 70–99)
Potassium: 2.9 mmol/L — ABNORMAL LOW (ref 3.5–5.1)
Sodium: 140 mmol/L (ref 135–145)
Total Bilirubin: 0.7 mg/dL (ref 0.3–1.2)
Total Protein: 7.1 g/dL (ref 6.5–8.1)

## 2018-11-20 LAB — LACTATE DEHYDROGENASE: LDH: 572 U/L — ABNORMAL HIGH (ref 98–192)

## 2018-11-20 LAB — PHOSPHORUS: Phosphorus: 4.6 mg/dL (ref 2.5–4.6)

## 2018-11-20 LAB — MAGNESIUM: Magnesium: 2 mg/dL (ref 1.7–2.4)

## 2018-11-20 MED ORDER — POTASSIUM CHLORIDE CRYS ER 20 MEQ PO TBCR: EXTENDED_RELEASE_TABLET | ORAL | 3 refills | Status: DC

## 2018-11-20 NOTE — Telephone Encounter (Signed)
Oral Chemotherapy Pharmacist Encounter   In preparation for the possible need to rasburicase, I contacted LuAnn Chrismon the the IV preauth team to have rasburicase preauthed. She stated that with the patient's insurance, she doesn't need preauthorization if the medication is medically indicated.   Darl Pikes, PharmD, BCPS, [facility] Hematology/Oncology Clinical Pharmacist [facility] [place] [facility] [phone]  11/20/2018 9:15 AM

## 2018-11-20 NOTE — Telephone Encounter (Signed)
Oral Oncology Patient Advocate Encounter  Received notification from [facility] that prior authorization for Venclexta is required.  PA submitted on CoverMyMeds Key [id number] Status is pending  Oral Oncology Clinic will continue to follow.  [place] Patient [place] Phone [phone] Fax [phone] 11/20/2018 11:35 AM

## 2018-11-20 NOTE — Telephone Encounter (Signed)
Oral Oncology Patient Advocate Encounter  Prior Authorization for Kathryn Hahn has been approved.    PA# [id number] Effective dates: 11/20/2018 until further notice.  Patients co-pay is $2742.90.   Oral Oncology Clinic will continue to follow.   [place] Patient [place] Phone [phone] Fax [phone] 11/20/2018 1:38 PM

## 2018-11-20 NOTE — Telephone Encounter (Signed)
[name]-please inform patient that I have sent a prescription for potassium to be taken [facility] a [facility]  She should start this immediately.    Also start allopurinol that was sent last night.

## 2018-11-20 NOTE — Telephone Encounter (Signed)
Spoke with patient's husband as he returned my phone call. Husband has verbal understanding of the plan of care.  Instructed pt's husband to have patient take the potassium dosing tonight with a full glass of water and food to avoid gastritis. Teach back process performed.

## 2018-11-20 NOTE — Telephone Encounter (Signed)
Oral Oncology Patient Advocate Encounter  Was successful in securing patient a $10,000 grant from [employer] to provide copayment coverage for [place].  This will keep the out of pocket expense at $0.     [employer] ID: [id number]  I have spoken with the patient.   The billing information is as follows and has been shared with [place].    RxBin: [id number] PCN: [id number] Member ID: [id number] Group ID: [id number] Dates of Eligibility: 10/21/2018 through 10/20/2019  [place] Patient [place] Phone [phone] Fax [phone] 11/20/2018 1:40 PM

## 2018-11-20 NOTE — Progress Notes (Signed)
Patient made aware that there allopurinol script was sent to pharmacy. She will pick this script up today.

## 2018-11-20 NOTE — Telephone Encounter (Signed)
Left detailed vm for patient.

## 2018-11-21 ENCOUNTER — Other Ambulatory Visit: Payer: Self-pay | Admitting: *Deleted

## 2018-11-21 DIAGNOSIS — D469 Myelodysplastic syndrome, unspecified: Secondary | ICD-10-CM

## 2018-11-21 LAB — URIC ACID: Uric Acid, Serum: 12 mg/dL — ABNORMAL HIGH (ref 2.5–7.1)

## 2018-11-21 MED FILL — VENCLEXTA 100 MG TABS: 100 | 30 days supply | Qty: 120 | Fill #0

## 2018-11-21 NOTE — Telephone Encounter (Signed)
Oral Chemotherapy Pharmacist Encounter  Patient Education I spoke with patient for overview of new oral chemotherapy medication: Venclexta (venetoclax) for the treatment of high grade MDS/acute leukemia in conjunction with azacitidine, planned duration until disease progression or unacceptable drug toxicity.  Pt is doing well. Counseled patient on administration, dosing, side effects, monitoring, drug-food interactions, safe handling, storage, and disposal. Patient will take 100mg  on day 1, 200mg  on day 2, then 400mg  on day 3 and beyond. This is the planned dose escalation and can be altered based on patient's treatment response/TLS monitoring.  Side effects include but not limited to: N/V, diarrhea, decreased wbc/hgb, fatigue.    Reviewed with patient importance of keeping a medication schedule and plan for any missed doses.  Ms. Mccannon voiced understanding and appreciation. All questions answered. Medication handout and calendar will be provided to the her next week during her appt.  Provided patient with [facility] [place] [facility] phone number. Patient knows to call the office with questions or concerns. Oral Chemotherapy Navigation Clinic will continue to follow.  Darl Pikes, PharmD, BCPS, [facility] Hematology/Oncology Clinical Pharmacist [facility] [place] [facility] [phone]  11/21/2018 2:02 PM

## 2018-11-21 NOTE — Telephone Encounter (Signed)
Spoke with patient and husband on the phone to set up first fill of Venclexta.  First prescription will be hand delivered to patient at appointment on 6/15 or 6/16, depending on when MD wants patient to start.  Future prescription fills will be mailed from [facility].  Alyson, Oral Oncology Pharmacist, provided Venclexta education to patient and husband as well.

## 2018-11-22 ENCOUNTER — Telehealth: Payer: Self-pay | Admitting: Internal Medicine

## 2018-11-22 ENCOUNTER — Telehealth: Payer: Self-pay | Admitting: *Deleted

## 2018-11-22 ENCOUNTER — Encounter: Payer: Self-pay | Admitting: Internal Medicine

## 2018-11-22 ENCOUNTER — Other Ambulatory Visit: Payer: Self-pay | Admitting: *Deleted

## 2018-11-22 DIAGNOSIS — D471 Chronic myeloproliferative disease: Secondary | ICD-10-CM

## 2018-11-22 LAB — COMP PANEL: LEUKEMIA/LYMPHOMA: Immunophenotypic Profile: 20

## 2018-11-22 NOTE — Telephone Encounter (Signed)
Dr Lovett Sox with [facility] called requesting Dr Rogue Bussing return her call about this patient [phone]

## 2018-11-22 NOTE — Telephone Encounter (Signed)
#   Spoke to Dr.Conway- blasts 20% on peripheral flow-cytometry-diagnosis acute myeloid leukemia. Also discussed with Dr.Rizeiri.   #Given the age/baseline electrolyte abnormalities-I would recommend hospitalization for tumor prophylaxis IV fluids etc. Spoke to pt's husband re: the plan; he agrees. LVM for pt.   #Spoke to charge nurse [place]; she is available for Monday to infuse chemotherapy.  Also plan prophylactic rasburicase; will check G6PD the morning of June 15th; admission to the hospital after evaluation in the clinic.  Plan to start venetoclax 6/16 or 6/17th based upon administration of rasburicase. Discussed with pharmacy.

## 2018-11-22 NOTE — Telephone Encounter (Signed)
Husband informed that patient's covid-19 was negative. He thanked me for updating him.

## 2018-11-23 ENCOUNTER — Other Ambulatory Visit: Payer: Self-pay | Admitting: Internal Medicine

## 2018-11-23 DIAGNOSIS — C9200 Acute myeloblastic leukemia, not having achieved remission: Secondary | ICD-10-CM

## 2018-11-25 ENCOUNTER — Encounter: Payer: Self-pay | Admitting: Internal Medicine

## 2018-11-25 ENCOUNTER — Inpatient Hospital Stay: Payer: Medicare Other | Admitting: Internal Medicine

## 2018-11-25 ENCOUNTER — Inpatient Hospital Stay (HOSPITAL_BASED_OUTPATIENT_CLINIC_OR_DEPARTMENT_OTHER): Payer: Medicare Other | Admitting: Internal Medicine

## 2018-11-25 ENCOUNTER — Inpatient Hospital Stay
Admission: AD | Admit: 2018-11-25 | Discharge: 2018-12-02 | DRG: 834 | Disposition: A | Discharge status: Home / Self Care | Payer: Medicare Other | Source: Ambulatory Visit | Attending: Internal Medicine | Admitting: Internal Medicine

## 2018-11-25 ENCOUNTER — Inpatient Hospital Stay: Payer: Medicare Other

## 2018-11-25 ENCOUNTER — Other Ambulatory Visit: Payer: Self-pay

## 2018-11-25 ENCOUNTER — Other Ambulatory Visit: Payer: Self-pay | Admitting: Internal Medicine

## 2018-11-25 DIAGNOSIS — E860 Dehydration: Secondary | ICD-10-CM | POA: Diagnosis present

## 2018-11-25 DIAGNOSIS — D638 Anemia in other chronic diseases classified elsewhere: Secondary | ICD-10-CM | POA: Diagnosis present

## 2018-11-25 DIAGNOSIS — D469 Myelodysplastic syndrome, unspecified: Secondary | ICD-10-CM

## 2018-11-25 DIAGNOSIS — Z923 Personal history of irradiation: Secondary | ICD-10-CM | POA: Diagnosis not present

## 2018-11-25 DIAGNOSIS — I10 Essential (primary) hypertension: Secondary | ICD-10-CM | POA: Diagnosis present

## 2018-11-25 DIAGNOSIS — Z8249 Family history of ischemic heart disease and other diseases of the circulatory system: Secondary | ICD-10-CM | POA: Diagnosis not present

## 2018-11-25 DIAGNOSIS — D61810 Antineoplastic chemotherapy induced pancytopenia: Secondary | ICD-10-CM | POA: Diagnosis present

## 2018-11-25 DIAGNOSIS — Z853 Personal history of malignant neoplasm of breast: Secondary | ICD-10-CM

## 2018-11-25 DIAGNOSIS — Z7189 Other specified counseling: Secondary | ICD-10-CM

## 2018-11-25 DIAGNOSIS — E883 Tumor lysis syndrome: Secondary | ICD-10-CM | POA: Diagnosis present

## 2018-11-25 DIAGNOSIS — D696 Thrombocytopenia, unspecified: Secondary | ICD-10-CM | POA: Diagnosis present

## 2018-11-25 DIAGNOSIS — E876 Hypokalemia: Secondary | ICD-10-CM | POA: Diagnosis present

## 2018-11-25 DIAGNOSIS — K5900 Constipation, unspecified: Secondary | ICD-10-CM | POA: Diagnosis present

## 2018-11-25 DIAGNOSIS — D649 Anemia, unspecified: Secondary | ICD-10-CM | POA: Diagnosis not present

## 2018-11-25 DIAGNOSIS — Z79899 Other long term (current) drug therapy: Secondary | ICD-10-CM

## 2018-11-25 DIAGNOSIS — C9200 Acute myeloblastic leukemia, not having achieved remission: Principal | ICD-10-CM | POA: Diagnosis present

## 2018-11-25 DIAGNOSIS — E782 Mixed hyperlipidemia: Secondary | ICD-10-CM | POA: Diagnosis present

## 2018-11-25 DIAGNOSIS — Z1159 Encounter for screening for other viral diseases: Secondary | ICD-10-CM | POA: Diagnosis not present

## 2018-11-25 DIAGNOSIS — Z888 Allergy status to other drugs, medicaments and biological substances status: Secondary | ICD-10-CM

## 2018-11-25 HISTORY — DX: Myelodysplastic syndrome, unspecified: D46.9

## 2018-11-25 HISTORY — DX: Leukemia, unspecified not having achieved remission: C95.90

## 2018-11-25 LAB — CBC WITH DIFFERENTIAL/PLATELET
Abs Immature Granulocytes: 2.4 10*3/uL — ABNORMAL HIGH (ref 0.00–0.07)
Abs Immature Granulocytes: 1.2 10*3/uL — ABNORMAL HIGH (ref 0.00–0.07)
Band Neutrophils: 6 %
Basophils Absolute: 0 10*3/uL (ref 0.0–0.1)
Basophils Absolute: 0 10*3/uL (ref 0.0–0.1)
Basophils Relative: 0 %
Basophils Relative: 0 %
Blasts: 23 %
Blasts: 8 %
Eosinophils Absolute: 0.7 10*3/uL — ABNORMAL HIGH (ref 0.0–0.5)
Eosinophils Absolute: 0 10*3/uL (ref 0.0–0.5)
Eosinophils Relative: 2 %
Eosinophils Relative: 0 %
HCT: 29.5 % — ABNORMAL LOW (ref 36.0–46.0)
HCT: 31.3 % — ABNORMAL LOW (ref 36.0–46.0)
Hemoglobin: 8.5 g/dL — ABNORMAL LOW (ref 12.0–15.0)
Hemoglobin: 9.1 g/dL — ABNORMAL LOW (ref 12.0–15.0)
Lymphocytes Relative: 5 %
Lymphocytes Relative: 24 %
Lymphs Abs: 1.7 10*3/uL (ref 0.7–4.0)
Lymphs Abs: 9.9 10*3/uL — ABNORMAL HIGH (ref 0.7–4.0)
MCH: 22.1 pg — ABNORMAL LOW (ref 26.0–34.0)
MCH: 22.4 pg — ABNORMAL LOW (ref 26.0–34.0)
MCHC: 28.8 g/dL — ABNORMAL LOW (ref 30.0–36.0)
MCHC: 29.1 g/dL — ABNORMAL LOW (ref 30.0–36.0)
MCV: 76.6 fL — ABNORMAL LOW (ref 80.0–100.0)
MCV: 76.9 fL — ABNORMAL LOW (ref 80.0–100.0)
Metamyelocytes Relative: 3 %
Metamyelocytes Relative: 2 %
Monocytes Absolute: 5.4 10*3/uL — ABNORMAL HIGH (ref 0.1–1.0)
Monocytes Absolute: 4.9 10*3/uL — ABNORMAL HIGH (ref 0.1–1.0)
Monocytes Relative: 16 %
Monocytes Relative: 12 %
Myelocytes: 4 %
Myelocytes: 1 %
Neutro Abs: 15.9 10*3/uL — ABNORMAL HIGH (ref 1.7–7.7)
Neutro Abs: 21.8 10*3/uL — ABNORMAL HIGH (ref 1.7–7.7)
Neutrophils Relative %: 47 %
Neutrophils Relative %: 47 %
Platelets: 33 10*3/uL — ABNORMAL LOW (ref 150–400)
Platelets: 34 10*3/uL — ABNORMAL LOW (ref 150–400)
RBC: 3.85 MIL/uL — ABNORMAL LOW (ref 3.87–5.11)
RBC: 4.07 MIL/uL (ref 3.87–5.11)
RDW: 19.3 % — ABNORMAL HIGH (ref 11.5–15.5)
RDW: 19 % — ABNORMAL HIGH (ref 11.5–15.5)
Smear Review: DECREASED
Smear Review: DECREASED
WBC Morphology: 23
WBC: 33.8 10*3/uL — ABNORMAL HIGH (ref 4.0–10.5)
WBC: 41.1 10*3/uL — ABNORMAL HIGH (ref 4.0–10.5)
nRBC: 1 % — ABNORMAL HIGH (ref 0.0–0.2)
nRBC: 1 % — ABNORMAL HIGH (ref 0.0–0.2)
nRBC: 2 /100 WBC — ABNORMAL HIGH

## 2018-11-25 LAB — URIC ACID: Uric Acid, Serum: 4.6 mg/dL (ref 2.5–7.1)

## 2018-11-25 LAB — COMPREHENSIVE METABOLIC PANEL
ALT: 13 U/L (ref 0–44)
ALT: 13 U/L (ref 0–44)
AST: 37 U/L (ref 15–41)
AST: 39 U/L (ref 15–41)
Albumin: 4.1 g/dL (ref 3.5–5.0)
Albumin: 4.3 g/dL (ref 3.5–5.0)
Alkaline Phosphatase: 62 U/L (ref 38–126)
Alkaline Phosphatase: 66 U/L (ref 38–126)
Anion gap: 11 (ref 5–15)
Anion gap: 7 (ref 5–15)
BUN: 24 mg/dL — ABNORMAL HIGH (ref 8–23)
BUN: 23 mg/dL (ref 8–23)
CO2: 27 mmol/L (ref 22–32)
CO2: 30 mmol/L (ref 22–32)
Calcium: 10.1 mg/dL (ref 8.9–10.3)
Calcium: 10.5 mg/dL — ABNORMAL HIGH (ref 8.9–10.3)
Chloride: 99 mmol/L (ref 98–111)
Chloride: 101 mmol/L (ref 98–111)
Creatinine, Ser: 0.9 mg/dL (ref 0.44–1.00)
Creatinine, Ser: 0.88 mg/dL (ref 0.44–1.00)
GFR calc Af Amer: >60 mL/min (ref >60)
GFR calc Af Amer: >60 mL/min (ref >60)
GFR calc non Af Amer: 60 mL/min — ABNORMAL LOW (ref >60)
GFR calc non Af Amer: >60 mL/min (ref >60)
Glucose, Bld: 159 mg/dL — ABNORMAL HIGH (ref 70–99)
Glucose, Bld: 110 mg/dL — ABNORMAL HIGH (ref 70–99)
Potassium: 3.7 mmol/L (ref 3.5–5.1)
Potassium: 3.8 mmol/L (ref 3.5–5.1)
Sodium: 137 mmol/L (ref 135–145)
Sodium: 138 mmol/L (ref 135–145)
Total Bilirubin: 0.5 mg/dL (ref 0.3–1.2)
Total Bilirubin: 0.9 mg/dL (ref 0.3–1.2)
Total Protein: 7 g/dL (ref 6.5–8.1)
Total Protein: 7.4 g/dL (ref 6.5–8.1)

## 2018-11-25 LAB — SAMPLE TO BLOOD BANK

## 2018-11-25 LAB — PHOSPHORUS: Phosphorus: 4.6 mg/dL (ref 2.5–4.6)

## 2018-11-25 LAB — MAGNESIUM: Magnesium: 1.8 mg/dL (ref 1.7–2.4)

## 2018-11-25 LAB — LACTATE DEHYDROGENASE: LDH: 623 U/L — ABNORMAL HIGH (ref 98–192)

## 2018-11-25 MED ORDER — SODIUM CHLORIDE 0.9 % IV SOLN
INTRAVENOUS | Status: DC
  Administered 2018-11-25 – 2018-12-02 (×12): via INTRAVENOUS

## 2018-11-25 MED ORDER — ONDANSETRON HCL 4 MG/2ML IJ SOLN: 4.0000 mg | Freq: Four times a day (QID) | INTRAMUSCULAR | Status: DC | PRN

## 2018-11-25 MED ORDER — AZACITIDINE CHEMO SQ INJECTION
75.0000 mg/m2 | Freq: Once | INTRAMUSCULAR | Status: AC
  Administered 2018-11-25: 16:00:00 105 mg via SUBCUTANEOUS
  Filled 2018-11-25: qty 4.2

## 2018-11-25 MED ORDER — POLYSACCHARIDE IRON COMPLEX 150 MG PO CAPS
150.0000 mg | ORAL_CAPSULE | Freq: Every day | ORAL | Status: DC
  Filled 2018-11-25: qty 1

## 2018-11-25 MED ORDER — ATENOLOL 50 MG PO TABS
50.0000 mg | ORAL_TABLET | Freq: Two times a day (BID) | ORAL | Status: DC
  Administered 2018-11-25 – 2018-12-02 (×14): 50 mg via ORAL
  Filled 2018-11-25 (×16): qty 1

## 2018-11-25 MED ORDER — ACETAMINOPHEN 325 MG PO TABS: 650.0000 mg | ORAL_TABLET | Freq: Four times a day (QID) | ORAL | Status: DC | PRN

## 2018-11-25 MED ORDER — ONDANSETRON HCL 4 MG PO TABS
8.0000 mg | ORAL_TABLET | Freq: Once | ORAL | Status: AC
  Administered 2018-11-25: 15:00:00 8 mg via ORAL
  Filled 2018-11-25: qty 2

## 2018-11-25 MED ORDER — HYDROCHLOROTHIAZIDE 12.5 MG PO CAPS
12.5000 mg | ORAL_CAPSULE | Freq: Every day | ORAL | Status: DC
  Administered 2018-11-26 – 2018-11-27 (×2): 12.5 mg via ORAL
  Filled 2018-11-25 (×2): qty 1

## 2018-11-25 MED ORDER — ALLOPURINOL 100 MG PO TABS
300.0000 mg | ORAL_TABLET | Freq: Two times a day (BID) | ORAL | Status: DC
  Administered 2018-11-25 – 2018-12-02 (×14): 300 mg via ORAL
  Filled 2018-11-25 (×14): qty 3

## 2018-11-25 MED ORDER — ACETAMINOPHEN 650 MG RE SUPP: 650.0000 mg | Freq: Four times a day (QID) | RECTAL | Status: DC | PRN

## 2018-11-25 MED ORDER — VITAMIN C 500 MG PO TABS
500.0000 mg | ORAL_TABLET | Freq: Two times a day (BID) | ORAL | Status: DC
  Administered 2018-11-25 – 2018-12-02 (×14): 500 mg via ORAL
  Filled 2018-11-25 (×14): qty 1

## 2018-11-25 MED ORDER — ONDANSETRON HCL 4 MG PO TABS: 4.0000 mg | ORAL_TABLET | Freq: Four times a day (QID) | ORAL | Status: DC | PRN

## 2018-11-25 MED ORDER — SENNOSIDES-DOCUSATE SODIUM 8.6-50 MG PO TABS
1.0000 | ORAL_TABLET | Freq: Every evening | ORAL | Status: DC | PRN
  Administered 2018-11-27: 22:00:00 1 via ORAL
  Filled 2018-11-25: qty 1

## 2018-11-25 MED ORDER — LOSARTAN POTASSIUM 50 MG PO TABS
50.0000 mg | ORAL_TABLET | Freq: Every day | ORAL | Status: DC
  Administered 2018-11-26 – 2018-12-02 (×7): 50 mg via ORAL
  Filled 2018-11-25 (×7): qty 1

## 2018-11-25 MED ORDER — MAGIC MOUTHWASH W/LIDOCAINE
5.0000 mL | Freq: Four times a day (QID) | ORAL | Status: DC | PRN
  Filled 2018-11-25: qty 5

## 2018-11-25 MED ORDER — LOSARTAN POTASSIUM-HCTZ 50-12.5 MG PO TABS: 1.0000 | ORAL_TABLET | Freq: Every day | ORAL | Status: DC

## 2018-11-25 MED ORDER — OMEGA-3-ACID ETHYL ESTERS 1 G PO CAPS
1.0000 g | ORAL_CAPSULE | Freq: Every day | ORAL | Status: DC
  Administered 2018-11-26 – 2018-12-02 (×7): 1 g via ORAL
  Filled 2018-11-25 (×7): qty 1

## 2018-11-25 MED ORDER — VITAMIN D 25 MCG (1000 UNIT) PO TABS
2000.0000 [IU] | ORAL_TABLET | Freq: Every day | ORAL | Status: DC
  Administered 2018-11-26 – 2018-12-02 (×7): 2000 [IU] via ORAL
  Filled 2018-11-25 (×7): qty 2

## 2018-11-25 MED ORDER — POLYSACCHARIDE IRON COMPLEX 150 MG PO CAPS
150.0000 mg | ORAL_CAPSULE | Freq: Two times a day (BID) | ORAL | Status: DC
  Administered 2018-11-25 – 2018-12-02 (×14): 150 mg via ORAL
  Filled 2018-11-25 (×15): qty 1

## 2018-11-25 MED ORDER — CALCIUM CARBONATE-VITAMIN D 500-200 MG-UNIT PO TABS
1.0000 | ORAL_TABLET | Freq: Every day | ORAL | Status: DC
  Administered 2018-11-26 – 2018-12-02 (×7): 1 via ORAL
  Filled 2018-11-25 (×7): qty 1

## 2018-11-25 NOTE — Progress Notes (Signed)
x

## 2018-11-25 NOTE — Progress Notes (Signed)
[facility] CONSULT NOTE  Patient Care Team: Rusty Aus, MD as PCP - General (Internal Medicine)  CHIEF COMPLAINTS/PURPOSE OF CONSULTATION:    Oncology History Overview Note  # SEP 2017- MYELOPROLIFERATIVE NEOPLASM- [WBC- 73; normal Hb/platelets] hypercellular bone marrow 90-95% proliferation of myeloid cells in various stages of maturation; relative erythroid hypoplasia and proliferation of atypical megakaryocytes; no increase in blasts; peripheral blood Bcr-Abl-NEG; Cytogenetics- WNL. NEG- Jak-2/MPL/CALR [place] limited- mild splenomegaly [~10cm; 463cm3]; OCT 2017- second opinion at [place]. Surveillance.   # With progressive leukocytosis we evaluated her a month ago and sent BM for exam. She has a progressive leukocytosis so hydrea 500mg  daily was started on 10/20/2016 and increased to 2gm daily then back down to one daily, then 5 days per week over time due to counts drop. Then we held her hydrea since 04/09/2018 due to continued declining Hb and Plt.s  BMB 06/24/18 showed markedly hypercellular BM 95% with myeloid hyperplasia and atypical megakaryocytic hyperplasia. No significant increase in blasts. 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 BCR / ABL negative, FISH normal. Flow Showed 2%CD34-positive myeloid blasts. Myeloid precursors with low side scatter. Pathogenic variants were detected in the ASXL1, CCND2, CUX1, and U2AF1 genes.  # March 2020- wbc- 14/ Hb 9.5/ platelets- 54.   ---------------------------------------------------   # June 8th 2020- Acute myeloid leukemia [peripheral blood flow cytometry;NGS- pending]- June 15th Vidaza [SQ 1-5 + Venatoclax- #1in pt]; tumor lysis prophylaxis    # 2002 [[place]] BREAST CA s/p Lumpect RT; [? Stage I] no chemo s/p AI  DIAGNOSIS: ACUTE MYELOID LEUKEMIA GOALS: pallaitive  CURRENT/MOST RECENT THERAPY : Vidaza+ venotoclax.      MDS (myelodysplastic syndrome)  ([place]) (Resolved)  08/28/2018 Initial Diagnosis   MDS (myelodysplastic syndrome) (HCC)   AML (acute myeloid leukemia) with failed remission ([place])  11/25/2018 Initial Diagnosis   AML (acute myeloid leukemia) with failed remission (HCC)      HISTORY OF PRESENTING ILLNESS:  Kathryn Hahn 85 y.o.  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  Patient complains of fatigue.  Otherwise denies any nausea vomiting.  Denies any nosebleeds or gum bleeding.   Review of Systems  Constitutional: Positive for malaise/fatigue. Negative for chills, diaphoresis, fever and weight loss.  HENT: Negative for nosebleeds and sore throat.   Eyes: Negative for double vision.  Respiratory: Negative for cough, hemoptysis, sputum production, shortness of breath and wheezing.   Cardiovascular: Negative for chest pain, palpitations, orthopnea and leg swelling.  Gastrointestinal: Negative for abdominal pain, blood in stool, constipation, diarrhea, heartburn, melena, nausea and vomiting.  Genitourinary: Negative for dysuria, frequency and urgency.  Musculoskeletal: Negative for back pain and joint pain.  Skin: Negative.  Negative for itching and rash.  Neurological: Negative for dizziness, tingling, focal weakness, weakness and headaches.  Endo/Heme/Allergies: Does not bruise/bleed easily.  Psychiatric/Behavioral: Negative for depression. The patient is not nervous/anxious and does not have insomnia.        MEDICAL HISTORY:  Past Medical History:  Diagnosis Date  . Anemia   . Arthritis   . Breast cancer ([place]) 2003   RT LUMPECTOMY  . Edema leg   . History of breast cancer 2003   post lumpectomy  . History of kidney stones   . Hyperlipemia, mixed   . Hypertension, essential   . Leukocytosis   . Personal history of radiation therapy 2003   BREAST CA  . Renal stones   .  Vitamin D deficiency      SURGICAL HISTORY: Past Surgical History:  Procedure Laterality Date  . BREAST BIOPSY Right 2003   Postive for cancer  . BREAST LUMPECTOMY Right 2003   BREAST CA  . KIDNEY STONE SURGERY Right     SOCIAL HISTORY: Social History   Socioeconomic History  . Marital status: Married    Spouse name: Not on file  . Number of children: Not on file  . Years of education: Not on file  . Highest education level: Not on file  Occupational History  . Not on file  Social Needs  . Financial resource strain: Not on file  . Food insecurity    Worry: Not on file    Inability: Not on file  . Transportation needs    Medical: Not on file    Non-medical: Not on file  Tobacco Use  . Smoking status: Never Smoker  . Smokeless tobacco: Never Used  Substance and Sexual Activity  . Alcohol use: Yes    Alcohol/week: 3.0 standard drinks    Types: 3 Glasses of wine per week  . Drug use: No  . Sexual activity: Not on file  Lifestyle  . Physical activity    Days per week: Not on file    Minutes per session: Not on file  . Stress: Not on file  Relationships  . Social Herbalist on phone: Not on file    Gets together: Not on file    Attends religious service: Not on file    Active member of club or organization: Not on file    Attends meetings of clubs or organizations: Not on file    Relationship status: Not on file  . Intimate partner violence    Fear of current or ex partner: Not on file    Emotionally abused: Not on file    Physically abused: Not on file    Forced sexual activity: Not on file  Other Topics Concern  . Not on file  Social History Narrative    No smoking/alcohol; lives in [place] with family.  She lives in assisted living with her husband.    FAMILY HISTORY: Family History  Problem Relation Age of Onset  . Stroke Mother   . Hypertension Father   . Stroke Father   . Breast cancer Neg Hx     ALLERGIES:  is allergic to terbinafine.  MEDICATIONS:   No current outpatient medications on file.   No current facility-administered medications for this visit.       Marland Kitchen  PHYSICAL EXAMINATION: ECOG PERFORMANCE STATUS: 0 - Asymptomatic  Vitals:   11/25/18 0936  BP: 135/78  Pulse: 70  Resp: 16  Temp: 97.9 F (36.6 C)   Filed Weights   11/25/18 0936  Weight: 101 lb 4.8 oz (45.9 kg)    Physical Exam  Constitutional: She is oriented to person, place, and time and well-developed, well-nourished, and in no distress.  HENT:  Head: Normocephalic and atraumatic.  Mouth/Throat: Oropharynx is clear and moist. No oropharyngeal exudate.  Eyes: Pupils are equal, round, and reactive to light.  Neck: Normal range of motion. Neck supple.  Cardiovascular: Normal rate and regular rhythm.  Pulmonary/Chest: No respiratory distress. She has no wheezes.  Abdominal: Soft. Bowel sounds are normal. She exhibits no distension and no mass. There is no abdominal tenderness. There is no rebound and no guarding.  Musculoskeletal: Normal range of motion.        General: No  tenderness or edema.  Neurological: She is alert and oriented to person, place, and time.  Skin: Skin is warm.  Psychiatric: Affect normal.  ;   LABORATORY DATA:  I have reviewed the data as listed Lab Results  Component Value Date   WBC 33.8 (H) 11/25/2018   HGB 8.5 (L) 11/25/2018   HCT 29.5 (L) 11/25/2018   MCV 76.6 (L) 11/25/2018   PLT 33 (L) 11/25/2018   Recent Labs    11/11/18 0939 11/20/18 0814 11/25/18 0904  NA 137 140 137  K 3.7 2.9* 3.7  CL 98 99 99  CO2 29 31 27   GLUCOSE 125* 130* 159*  BUN 30* 24* 24*  CREATININE 0.88 0.90 0.90  CALCIUM 10.0 10.3 10.1  GFRNONAA >60 60* 60*  GFRAA >60 >60 >60  PROT  --  7.1 7.0  ALBUMIN  --  4.0 4.1  AST  --  39 37  ALT  --  14 13  ALKPHOS  --  65 62  BILITOT  --  0.7 0.5    RADIOGRAPHIC STUDIES: I have personally reviewed the radiological images as listed and agreed with the findings in the report. No results  found.  ASSESSMENT & PLAN:   AML (acute myeloid leukemia) with failed remission (HCC) #Acute myeloid leukemia with monocytic differentiation [20% blasts on peripheral blood flow cytometry].   # Reviewed the pathology and prognosis in detail with the patient.  Discussed the patient is not a candidate for any aggressive therapies like induction chemotherapies.  Recommend venetoclax plus Vidaza-for palliative reasons/control.  Discussed that response rates are up to 60 to 70%; the most recent phase 3 study showed median survival of 14 months.  Discussed the potential side effects of Vidaza. Also discussed the potential side effects of venetoclax including but not limited to tumor lysis syndrome/neutropenia.  Discussed with Dr. Lovie Macadamia. He agrees.  #Risk of tumor lysis-uric acid 12 at baseline.;  Will check G6PD today-in preparation for rasburicase/prophylactic.  We will proceed with rasburicase if G6PD normal level.  For now we will  continue allopurinol.  Will admit the patient to the hospital for [place]; will plan venetoclax after rasburicase is given in the next 1 to 2 days.  We will also recommend nephrology consultation. Recommend IVF 0.9NS at 75cc/hour.   #Discussed with Dr. Bridgett Larsson.  He kindly agrees to admit the patient to hospital.  Patient is COVID negative tested on 11/20/2018.   # DISPOSITION:  # in pt Hospital admission for vidaza-day 1 to day 5;  # follow up TBD.   # 40 minutes face-to-face with the patient discussing the above plan of care; more than 50% of time spent on prognosis/ natural history; counseling and coordination.         Cammie Sickle, MD 11/25/2018 11:38 AM

## 2018-11-25 NOTE — Assessment & Plan Note (Addendum)
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   # Reviewed the pathology and prognosis in detail with the patient.  Discussed the patient is not a candidate for any aggressive therapies like induction chemotherapies.  Recommend venetoclax plus Vidaza-for palliative reasons/control.  Discussed that response rates are up to 60 to 70%; the most recent phase 3 study showed median survival of 14 months.  Discussed the potential side effects of Vidaza. Also discussed the potential side effects of venetoclax including but not limited to tumor lysis syndrome/neutropenia.  Discussed with Dr. Lovie Macadamia. He agrees.  #Risk of tumor lysis-uric acid 12 at baseline.;  Will check G6PD today-in preparation for rasburicase/prophylactic.  We will proceed with rasburicase if G6PD normal level.  For now we will  continue allopurinol.  Will admit the patient to the hospital for [place]; will plan venetoclax after rasburicase is given in the next 1 to 2 days.  We will also recommend nephrology consultation. Recommend IVF 0.9NS at 75cc/hour.   #Discussed with Dr. Bridgett Larsson.  He kindly agrees to admit the patient to hospital.  Patient is COVID negative tested on 11/20/2018.   # DISPOSITION:  # in pt Hospital admission for vidaza-day 1 to day 5;  # follow up TBD.   # 40 minutes face-to-face with the patient discussing the above plan of care; more than 50% of time spent on prognosis/ natural history; counseling and coordination.

## 2018-11-25 NOTE — Progress Notes (Signed)
Advanced care plan. Purpose of the Encounter: CODE STATUS Parties in Attendance: Patient Patient's Decision Capacity: Good Subjective/Patient's story:  Kathryn Hahn  is a 85 y.o. female with a known history of leukemia, myelodysplastic syndrome, hyperlipidemia, hypertension, breast cancer, lumpectomy, vitamin D deficiency was referred by oncology clinic.  Patient planned for chemotherapy.  Has elevated uric acid level and needs assessment for tumor lysis syndrome.  Appears dry and dehydrated.  Recently had a COVID-19 test done couple of days ago which was negative.  No recent travel.  No cough.  No sick contacts at home Objective/Medical story Patient needs chemotherapy for leukemia.  She needs evaluation for tumor lysis syndrome.  IV fluid hydration for dehydration. Goals of care determination:  Advance care directives goals of care treatment plan discussed Patient wants everything done which includes cpr, intubation and ventilator if the need arises. CODE STATUS: Full code Time spent discussing advanced care planning: 16 minutes

## 2018-11-25 NOTE — H&P (Signed)
[name] at [place] NAME: Kathryn Hahn    MR#:  [id number]  DATE OF BIRTH:  1933-08-22  DATE OF ADMISSION:  11/25/2018  PRIMARY CARE PHYSICIAN: Rusty Aus, MD   REQUESTING/REFERRING PHYSICIAN:   CHIEF COMPLAINT: Referred by oncology office for direct admission  HISTORY OF PRESENT ILLNESS: Kathryn Hahn  is a 85 y.o. female with a known history of leukemia, myelodysplastic syndrome, hyperlipidemia, hypertension, breast cancer, lumpectomy, vitamin D deficiency was referred by oncology clinic.  Patient planned for chemotherapy.  Has elevated uric acid level and needs assessment for tumor lysis syndrome.  Appears dry and dehydrated.  Recently had a COVID-19 test done couple of days ago which was negative.  No recent travel.  No cough.  No sick contacts at home.  PAST MEDICAL HISTORY:   Past Medical History:  Diagnosis Date  . Anemia   . Arthritis   . Breast cancer ([place]) 2003   RT LUMPECTOMY  . Edema leg   . History of breast cancer 2003   post lumpectomy  . History of kidney stones   . Hyperlipemia, mixed   . Hypertension, essential   . Leukemia ([place])   . Leukocytosis   . MDS (myelodysplastic syndrome) ([place])   . Personal history of radiation therapy 2003   BREAST CA  . Renal stones   . Vitamin D deficiency     PAST SURGICAL HISTORY:  Past Surgical History:  Procedure Laterality Date  . BREAST BIOPSY Right 2003   Postive for cancer  . BREAST LUMPECTOMY Right 2003   BREAST CA  . KIDNEY STONE SURGERY Right     SOCIAL HISTORY:  Social History   Tobacco Use  . Smoking status: Never Smoker  . Smokeless tobacco: Never Used  Substance Use Topics  . Alcohol use: Yes    Alcohol/week: 3.0 standard drinks    Types: 3 Glasses of wine per week    FAMILY HISTORY:  Family History  Problem Relation Age of Onset  . Stroke Mother   . Hypertension Father   . Stroke Father   . Breast cancer Neg Hx     DRUG  ALLERGIES:  Allergies  Allergen Reactions  . Terbinafine Rash and Swelling    REVIEW OF SYSTEMS:   CONSTITUTIONAL: No fever, has fatigue and weakness.  EYES: No blurred or double vision.  EARS, NOSE, AND THROAT: No tinnitus or ear pain.  RESPIRATORY: No cough, shortness of breath, wheezing or hemoptysis.  CARDIOVASCULAR: No chest pain, orthopnea, edema.  GASTROINTESTINAL: No nausea, vomiting, diarrhea or abdominal pain.  GENITOURINARY: No dysuria, hematuria.  ENDOCRINE: No polyuria, nocturia,  HEMATOLOGY: No anemia, easy bruising or bleeding SKIN: No rash or lesion. MUSCULOSKELETAL: No joint pain or arthritis.   NEUROLOGIC: No tingling, numbness, weakness.  PSYCHIATRY: No anxiety or depression.   MEDICATIONS AT HOME:  Prior to Admission medications   Medication Sig Start Date End Date Taking? Authorizing Provider  allopurinol (ZYLOPRIM) 300 MG tablet Take 1 tablet (300 mg total) by mouth 2 (two) times daily. 11/19/18   Cammie Sickle, MD  atenolol (TENORMIN) 50 MG tablet Take 1 tablet by mouth 2 (two) times daily.    [provider]  CALCIUM-VITAMIN D PO Take 1 tablet by mouth daily.     [provider]  Cholecalciferol (VITAMIN D) 2000 units tablet Take 1 tablet by mouth daily.    [provider]  iron polysaccharides (FERREX 150) 150 MG capsule Take 1  capsule (150 mg total) by mouth daily. 10/10/18   Cammie Sickle, MD  losartan-hydrochlorothiazide (HYZAAR) 50-12.5 MG tablet Take 1 tablet by mouth daily.    [provider]  magic mouthwash w/lidocaine SOLN Take 5 mLs by mouth 4 (four) times daily as needed for mouth pain. 11/11/18   Cammie Sickle, MD  Omega-3 Fatty Acids (FISH OIL PO) Take 1 capsule by mouth daily.    [provider]  potassium chloride SA (K-DUR) 20 MEQ tablet 1 pill twice a day 11/20/18   Cammie Sickle, MD  venetoclax 100 MG TABS Take 400 mg by mouth daily. Take as directed. 11/19/18    Cammie Sickle, MD  vitamin C (ASCORBIC ACID) 500 MG tablet Take 1 tablet by mouth 2 (two) times daily.    [provider]      PHYSICAL EXAMINATION:   VITAL SIGNS: Blood pressure 136/69, pulse 64, temperature 98.3 F (36.8 C), temperature source Oral, resp. rate 18, SpO2 100 %.  GENERAL:  85 y.o.-year-old patient lying in the bed with no acute distress.  EYES: Pupils equal, round, reactive to light and accommodation. No scleral icterus. Extraocular muscles intact.  HEENT: Head atraumatic, normocephalic. Oropharynx and nasopharynx clear.  NECK:  Supple, no jugular venous distention. No thyroid enlargement, no tenderness.  LUNGS: Normal breath sounds bilaterally, no wheezing, rales,rhonchi or crepitation. No use of accessory muscles of respiration.  CARDIOVASCULAR: S1, S2 normal. No murmurs, rubs, or gallops.  ABDOMEN: Soft, nontender, nondistended. Bowel sounds present. No organomegaly or mass.  EXTREMITIES: No pedal edema, cyanosis, or clubbing.  NEUROLOGIC: Cranial nerves II through XII are intact. Muscle strength 5/5 in all extremities. Sensation intact. Gait not checked.  PSYCHIATRIC: The patient is alert and oriented x 3.  SKIN: No obvious rash, lesion, or ulcer.   LABORATORY PANEL:   CBC Recent Labs  Lab 11/20/18 0814 11/25/18 0904 11/25/18 1219  WBC 39.7* 33.8* 41.1*  HGB 8.7* 8.5* 9.1*  HCT 29.5* 29.5* 31.3*  PLT 37* 33* 34*  MCV 76.8* 76.6* 76.9*  MCH 22.7* 22.1* 22.4*  MCHC 29.5* 28.8* 29.1*  RDW 18.9* 19.3* 19.0*  LYMPHSABS 5.2* 1.7 PENDING  MONOABS 2.8* 5.4* PENDING  EOSABS 0.8* 0.7* PENDING  BASOSABS 0.0 0.0 PENDING   ------------------------------------------------------------------------------------------------------------------  Chemistries  Recent Labs  Lab 11/20/18 0814 11/25/18 0904  NA 140 137  K 2.9* 3.7  CL 99 99  CO2 31 27  GLUCOSE 130* 159*  BUN 24* 24*  CREATININE 0.90 0.90  CALCIUM 10.3 10.1  MG 2.0 1.8  AST 39 37   ALT 14 13  ALKPHOS 65 62  BILITOT 0.7 0.5   ------------------------------------------------------------------------------------------------------------------ estimated creatinine clearance is 29.4 mL/min (by C-G formula based on SCr of 0.9 mg/dL). ------------------------------------------------------------------------------------------------------------------ No results for input(s): TSH, T4TOTAL, T3FREE, THYROIDAB in the last 72 hours.  Invalid input(s): FREET3   Coagulation profile No results for input(s): INR, PROTIME in the last 168 hours. ------------------------------------------------------------------------------------------------------------------- No results for input(s): DDIMER in the last 72 hours. -------------------------------------------------------------------------------------------------------------------  Cardiac Enzymes No results for input(s): CKMB, TROPONINI, MYOGLOBIN in the last 168 hours.  Invalid input(s): CK ------------------------------------------------------------------------------------------------------------------ Invalid input(s): POCBNP  ---------------------------------------------------------------------------------------------------------------  Urinalysis No results found for: COLORURINE, APPEARANCEUR, LABSPEC, PHURINE, GLUCOSEU, HGBUR, BILIRUBINUR, KETONESUR, PROTEINUR, UROBILINOGEN, NITRITE, LEUKOCYTESUR   RADIOLOGY: No results found.  EKG: No orders found for this or any previous visit.  IMPRESSION AND PLAN: 85 year old female patient with a known history of leukemia, myelodysplastic syndrome, hyperlipidemia, hypertension, breast cancer, lumpectomy, vitamin D deficiency  was referred by oncology clinic.  Patient planned for chemotherapy.   -Leukemia Plan for chemotherapy Oncology evaluation  -Elevated uric acid levels Monitor for tumor lysis syndrome IV fluid hydration Continue allopurinol  -Dehydration IV fluid  hydration with normal saline  -History of myelodysplastic syndrome  -DVT prophylaxis Sequential compression device to lower extremities  -Thrombocytopenia Monitor platelet counts Avoid blood thinner meds  All the records are reviewed and case discussed with ED provider. Management plans discussed with the patient, family and they are in agreement.  CODE STATUS:Full code    Code Status Orders  (From admission, onward)         Start     Ordered   11/25/18 1203  Full code  Continuous     11/25/18 1202        Code Status History    This patient has a current code status but no historical code status.   Advance Care Planning Activity    Advance Directive Documentation     Most Recent Value  Type of Advance Directive  Living will  Pre-existing out of facility DNR order (yellow form or pink MOST form)  -  "MOST" Form in Place?  -       TOTAL TIME TAKING CARE OF THIS PATIENT: 53 minutes.    Saundra Shelling M.D on 11/25/2018 at 12:47 PM  Between 7am to 6pm - Pager - [phone]  After 6pm go to [email or web address] - password [email or web address] [facility]  Office  [phone]  CC: Primary care physician; Rusty Aus, MD

## 2018-11-25 NOTE — Progress Notes (Signed)
Patient states that she does have SOBr and more fatigue but that has been stable since diagnosis.

## 2018-11-26 ENCOUNTER — Inpatient Hospital Stay: Payer: Medicare Other

## 2018-11-26 ENCOUNTER — Telehealth: Payer: Self-pay | Admitting: Pharmacist

## 2018-11-26 DIAGNOSIS — C9200 Acute myeloblastic leukemia, not having achieved remission: Principal | ICD-10-CM

## 2018-11-26 DIAGNOSIS — D649 Anemia, unspecified: Secondary | ICD-10-CM

## 2018-11-26 LAB — CBC
HCT: 26.3 % — ABNORMAL LOW (ref 36.0–46.0)
Hemoglobin: 7.5 g/dL — ABNORMAL LOW (ref 12.0–15.0)
MCH: 22.4 pg — ABNORMAL LOW (ref 26.0–34.0)
MCHC: 28.5 g/dL — ABNORMAL LOW (ref 30.0–36.0)
MCV: 78.5 fL — ABNORMAL LOW (ref 80.0–100.0)
Platelets: 31 10*3/uL — ABNORMAL LOW (ref 150–400)
RBC: 3.35 MIL/uL — ABNORMAL LOW (ref 3.87–5.11)
RDW: 19.1 % — ABNORMAL HIGH (ref 11.5–15.5)
WBC: 32.2 10*3/uL — ABNORMAL HIGH (ref 4.0–10.5)
nRBC: 0.9 % — ABNORMAL HIGH (ref 0.0–0.2)

## 2018-11-26 LAB — BASIC METABOLIC PANEL
Anion gap: 8 (ref 5–15)
BUN: 22 mg/dL (ref 8–23)
CO2: 27 mmol/L (ref 22–32)
Calcium: 9.3 mg/dL (ref 8.9–10.3)
Chloride: 104 mmol/L (ref 98–111)
Creatinine, Ser: 0.86 mg/dL (ref 0.44–1.00)
GFR calc Af Amer: >60 mL/min (ref >60)
GFR calc non Af Amer: >60 mL/min (ref >60)
Glucose, Bld: 103 mg/dL — ABNORMAL HIGH (ref 70–99)
Potassium: 3.6 mmol/L (ref 3.5–5.1)
Sodium: 139 mmol/L (ref 135–145)

## 2018-11-26 LAB — GLUCOSE 6 PHOSPHATE DEHYDROGENASE
G6PDH: 28.1 U/g{Hb} — ABNORMAL HIGH (ref 4.6–13.5)
Hemoglobin: 7.1 g/dL — ABNORMAL LOW (ref 11.1–15.9)

## 2018-11-26 LAB — ABO/RH: ABO/RH(D): O POS

## 2018-11-26 LAB — URIC ACID: Uric Acid, Serum: 3.8 mg/dL (ref 2.5–7.1)

## 2018-11-26 LAB — PREPARE RBC (CROSSMATCH)

## 2018-11-26 LAB — LACTATE DEHYDROGENASE: LDH: 547 U/L — ABNORMAL HIGH (ref 98–192)

## 2018-11-26 MED ORDER — SODIUM CHLORIDE 0.9% FLUSH: 10.0000 mL | INTRAVENOUS | Status: DC | PRN

## 2018-11-26 MED ORDER — AZACITIDINE CHEMO SQ INJECTION
75.0000 mg/m2 | Freq: Once | INTRAMUSCULAR | Status: AC
  Administered 2018-11-26: 15:00:00 105 mg via SUBCUTANEOUS
  Filled 2018-11-26: qty 4.2

## 2018-11-26 MED ORDER — VENETOCLAX 100 MG PO TABS: 400.0000 mg | ORAL_TABLET | Freq: Every day | ORAL | Status: DC

## 2018-11-26 MED ORDER — SODIUM CHLORIDE 0.9% IV SOLUTION
250.0000 mL | Freq: Once | INTRAVENOUS | Status: AC
  Administered 2018-11-26: 16:00:00 250 mL via INTRAVENOUS

## 2018-11-26 MED ORDER — ONDANSETRON HCL 4 MG PO TABS
8.0000 mg | ORAL_TABLET | Freq: Once | ORAL | Status: AC
  Administered 2018-11-26: 15:00:00 8 mg via ORAL
  Filled 2018-11-26: qty 2

## 2018-11-26 MED ORDER — HEPARIN SOD (PORK) LOCK FLUSH 100 UNIT/ML IV SOLN: 500.0000 [IU] | Freq: Every day | INTRAVENOUS | Status: DC | PRN

## 2018-11-26 MED ORDER — SODIUM CHLORIDE 0.9% FLUSH: 3.0000 mL | INTRAVENOUS | Status: DC | PRN

## 2018-11-26 MED ORDER — HEPARIN SOD (PORK) LOCK FLUSH 100 UNIT/ML IV SOLN: 250.0000 [IU] | INTRAVENOUS | Status: DC | PRN

## 2018-11-26 NOTE — Progress Notes (Signed)
[facility] at [place] NAME: Kathryn Hahn    MR#:  [id number]  DATE OF BIRTH:  Jun 18, 1933  SUBJECTIVE:  Patient seen and evaluated today No complaints of chest pain No shortness of breath No fever No nausea and vomiting  REVIEW OF SYSTEMS:    ROS  CONSTITUTIONAL: No documented fever. No fatigue, weakness. No weight gain, no weight loss.  EYES: No blurry or double vision.  ENT: No tinnitus. No postnasal drip. No redness of the oropharynx.  RESPIRATORY: No cough, no wheeze, no hemoptysis. No dyspnea.  CARDIOVASCULAR: No chest pain. No orthopnea. No palpitations. No syncope.  GASTROINTESTINAL: No nausea, no vomiting or diarrhea. No abdominal pain. No melena or hematochezia.  GENITOURINARY: No dysuria or hematuria.  ENDOCRINE: No polyuria or nocturia. No heat or cold intolerance.  HEMATOLOGY: No anemia. No bruising. No bleeding.  INTEGUMENTARY: No rashes. No lesions.  MUSCULOSKELETAL: No arthritis. No swelling. No gout.  NEUROLOGIC: No numbness, tingling, or ataxia. No seizure-type activity.  PSYCHIATRIC: No anxiety. No insomnia. No ADD.   DRUG ALLERGIES:   Allergies  Allergen Reactions  . Terbinafine Rash and Swelling    VITALS:  Blood pressure 132/72, pulse 72, temperature 97.7 F (36.5 C), temperature source Oral, resp. rate 18, height 4\' 9"  (1.448 m), weight 42.8 kg, SpO2 98 %.  PHYSICAL EXAMINATION:   Physical Exam  GENERAL:  85 y.o.-year-old patient lying in the bed with no acute distress.  EYES: Pupils equal, round, reactive to light and accommodation. No scleral icterus. Extraocular muscles intact.  HEENT: Head atraumatic, normocephalic. Oropharynx and nasopharynx clear.  NECK:  Supple, no jugular venous distention. No thyroid enlargement, no tenderness.  LUNGS: Normal breath sounds bilaterally, no wheezing, rales, rhonchi. No use of accessory muscles of respiration.  CARDIOVASCULAR: S1, S2 normal. No murmurs, rubs,  or gallops.  ABDOMEN: Soft, nontender, nondistended. Bowel sounds present. No organomegaly or mass.  EXTREMITIES: No cyanosis, clubbing or edema b/l.    NEUROLOGIC: Cranial nerves II through XII are intact. No focal Motor or sensory deficits b/l.   PSYCHIATRIC: The patient is alert and oriented x 3.  SKIN: No obvious rash, lesion, or ulcer.   LABORATORY PANEL:   CBC Recent Labs  Lab 11/26/18 0418  WBC 32.2*  HGB 7.5*  HCT 26.3*  PLT 31*   ------------------------------------------------------------------------------------------------------------------ Chemistries  Recent Labs  Lab 11/25/18 0904 11/25/18 1219 11/26/18 0418  NA 137 138 139  K 3.7 3.8 3.6  CL 99 101 104  CO2 27 30 27   GLUCOSE 159* 110* 103*  BUN 24* 23 22  CREATININE 0.90 0.88 0.86  CALCIUM 10.1 10.5* 9.3  MG 1.8  --   --   AST 37 39  --   ALT 13 13  --   ALKPHOS 62 66  --   BILITOT 0.5 0.9  --    ------------------------------------------------------------------------------------------------------------------  Cardiac Enzymes No results for input(s): TROPONINI in the last 168 hours. ------------------------------------------------------------------------------------------------------------------  RADIOLOGY:  No results found.   ASSESSMENT AND PLAN:  85 year old female patient with history of myeloproliferative neoplasm, progressive leukocytosis, breast cancer, hypertension, hyperlipidemia currently under hospitalist service  -Myeloproliferative neoplasm Oncology follow-up Chemotherapy Vidaza injection [place] given Watch for nausea vomiting and cell counts  -Elevated uric acid levels Watch for tumor lysis syndrome IV fluids Allopurinol on board  -Chronic thrombocytopenia Monitor platelet count  -DVT prophylaxis sequential compression device to lower extremities  -Anemia Chronic Transfuse 1 unit PRBC IV  All the records are reviewed  and case discussed with Care Management/Social  Worker. Management plans discussed with the patient, family and they are in agreement.  CODE STATUS: Full code  DVT Prophylaxis: SCDs  TOTAL TIME TAKING CARE OF THIS PATIENT: 35 minutes.   POSSIBLE D/C IN 2 to 3 DAYS, DEPENDING ON CLINICAL CONDITION.  Saundra Shelling M.D on 11/26/2018 at 10:18 AM  Between 7am to 6pm - Pager - [phone]  After 6pm go to [email or web address] - password [email or web address] [facility]  Office  [phone]  CC: Primary care physician; Rusty Aus, MD  Note: This dictation was prepared with Dragon dictation along with smaller phrase technology. Any transcriptional errors that result from this process are unintentional.

## 2018-11-26 NOTE — Consult Note (Signed)
[facility] CONSULT NOTE  Patient Care Team: Rusty Aus, MD as PCP - General (Internal Medicine)  CHIEF COMPLAINTS/PURPOSE OF CONSULTATION:  Acute myeloid leukemia  HISTORY OF PRESENTING ILLNESS:  Kathryn Hahn 85 y.o.  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  Patient admits to fatigue.  Denies any nausea vomiting.  Appetite is fair.  No weight loss.  Complains of easy bruising.  Patient received Vidaza dose #1 yesterday.  Uneventful.  Review of Systems  Constitutional: Positive for malaise/fatigue. Negative for chills, diaphoresis, fever and weight loss.  HENT: Negative for nosebleeds and sore throat.   Eyes: Negative for double vision.  Respiratory: Negative for cough, hemoptysis, sputum production, shortness of breath and wheezing.   Cardiovascular: Negative for chest pain, palpitations, orthopnea and leg swelling.  Gastrointestinal: Negative for abdominal pain, blood in stool, constipation, diarrhea, heartburn, melena, nausea and vomiting.  Genitourinary: Negative for dysuria, frequency and urgency.  Musculoskeletal: Negative for back pain and joint pain.  Skin: Negative.  Negative for itching and rash.  Neurological: Negative for dizziness, tingling, focal weakness, weakness and headaches.  Endo/Heme/Allergies: Bruises/bleeds easily.  Psychiatric/Behavioral: Negative for depression. The patient is not nervous/anxious and does not have insomnia.      MEDICAL HISTORY:  Past Medical History:  Diagnosis Date  . Anemia   . Arthritis   . Breast cancer ([place]) 2003   RT LUMPECTOMY  . Edema leg   . History of breast cancer 2003   post lumpectomy  . History of kidney stones   . Hyperlipemia, mixed   . Hypertension, essential   . Leukemia ([place])   . Leukocytosis   . MDS (myelodysplastic syndrome) ([place])   .  Personal history of radiation therapy 2003   BREAST CA  . Renal stones   . Vitamin D deficiency     SURGICAL HISTORY: Past Surgical History:  Procedure Laterality Date  . BREAST BIOPSY Right 2003   Postive for cancer  . BREAST LUMPECTOMY Right 2003   BREAST CA  . KIDNEY STONE SURGERY Right     SOCIAL HISTORY: Social History   Socioeconomic History  . Marital status: Married    Spouse name: Not on file  . Number of children: Not on file  . Years of education: Not on file  . Highest education level: Not on file  Occupational History  . Not on file  Social Needs  . Financial resource strain: Not on file  . Food insecurity    Worry: Not on file    Inability: Not on file  . Transportation needs    Medical: Not on file    Non-medical: Not on file  Tobacco Use  . Smoking status: Never Smoker  . Smokeless tobacco: Never Used  Substance and Sexual Activity  . Alcohol use: Yes    Alcohol/week: 3.0 standard drinks    Types: 3 Glasses of wine per week  . Drug use: No  . Sexual activity: Not on file  Lifestyle  . Physical activity    Days per week: Not on file    Minutes per session: Not on file  . Stress: Not on file  Relationships  . Social Herbalist on phone: Not on file    Gets together: Not on file    Attends religious service: Not on file    Active member of club or organization: Not  on file    Attends meetings of clubs or organizations: Not on file    Relationship status: Not on file  . Intimate partner violence    Fear of current or ex partner: Not on file    Emotionally abused: Not on file    Physically abused: Not on file    Forced sexual activity: Not on file  Other Topics Concern  . Not on file  Social History Narrative    No smoking/alcohol; lives in [place] with family.  She lives in assisted living with her husband.    FAMILY HISTORY: Family History  Problem Relation Age of Onset  . Stroke Mother   . Hypertension Father   .  Stroke Father   . Breast cancer Neg Hx     ALLERGIES:  is allergic to terbinafine.  MEDICATIONS:  Current Facility-Administered Medications  Medication Dose Route Frequency Provider Last Rate Last Dose  . 0.9 %  sodium chloride infusion   Intravenous Continuous Saundra Shelling, MD 75 mL/hr at 11/26/18 1549    . acetaminophen (TYLENOL) tablet 650 mg  650 mg Oral Q6H PRN Saundra Shelling, MD       Or  . acetaminophen (TYLENOL) suppository 650 mg  650 mg Rectal Q6H PRN Pyreddy, Reatha Harps, MD      . allopurinol (ZYLOPRIM) tablet 300 mg  300 mg Oral BID Saundra Shelling, MD   300 mg at 11/26/18 0846  . atenolol (TENORMIN) tablet 50 mg  50 mg Oral BID Saundra Shelling, MD   50 mg at 11/26/18 0847  . calcium-vitamin D (OSCAL WITH D) 500-200 MG-UNIT per tablet 1 tablet  1 tablet Oral Daily Saundra Shelling, MD   1 tablet at 11/26/18 0846  . cholecalciferol (VITAMIN D3) tablet 2,000 Units  2,000 Units Oral Daily Pyreddy, Pavan, MD   2,000 Units at 11/26/18 0846  . heparin lock flush 100 unit/mL  500 Units Intracatheter Daily PRN Charlaine Dalton R, MD      . heparin lock flush 100 unit/mL  250 Units Intracatheter PRN Cammie Sickle, MD      . losartan (COZAAR) tablet 50 mg  50 mg Oral Daily Pyreddy, Reatha Harps, MD   50 mg at 11/26/18 0846   And  . hydrochlorothiazide (MICROZIDE) capsule 12.5 mg  12.5 mg Oral Daily Pyreddy, Reatha Harps, MD   12.5 mg at 11/26/18 0846  . iron polysaccharides (NIFEREX) capsule 150 mg  150 mg Oral BID Lance Coon, MD   150 mg at 11/26/18 0847  . magic mouthwash w/lidocaine  5 mL Oral QID PRN Saundra Shelling, MD      . omega-3 acid ethyl esters (LOVAZA) capsule 1 g  1 g Oral Daily Pyreddy, Pavan, MD   1 g at 11/26/18 0847  . ondansetron (ZOFRAN) tablet 4 mg  4 mg Oral Q6H PRN Pyreddy, Reatha Harps, MD       Or  . ondansetron (ZOFRAN) injection 4 mg  4 mg Intravenous Q6H PRN Pyreddy, Pavan, MD      . senna-docusate (Senokot-S) tablet 1 tablet  1 tablet Oral QHS PRN Pyreddy, Reatha Harps, MD       . sodium chloride flush (NS) 0.9 % injection 10 mL  10 mL Intracatheter PRN Charlaine Dalton R, MD      . sodium chloride flush (NS) 0.9 % injection 3 mL  3 mL Intracatheter PRN Cammie Sickle, MD      . vitamin C (ASCORBIC ACID) tablet 500 mg  500 mg Oral BID Saundra Shelling, MD  500 mg at 11/26/18 0846      .  PHYSICAL EXAMINATION:  Vitals:   11/26/18 1212 11/26/18 1445  BP: 133/70 (!) 143/69  Pulse: 69 75  Resp:  20  Temp: (!) 97.4 F (36.3 C) 97.7 F (36.5 C)  SpO2: 97% 97%   Filed Weights   11/25/18 1625  Weight: 94 lb 5.7 oz (42.8 kg)    Physical Exam  Constitutional: She is oriented to person, place, and time and well-developed, well-nourished, and in no distress.  HENT:  Head: Normocephalic and atraumatic.  Mouth/Throat: Oropharynx is clear and moist. No oropharyngeal exudate.  Eyes: Pupils are equal, round, and reactive to light.  Neck: Normal range of motion. Neck supple.  Cardiovascular: Normal rate and regular rhythm.  Pulmonary/Chest: Effort normal and breath sounds normal. No respiratory distress. She has no wheezes.  Abdominal: Soft. Bowel sounds are normal. She exhibits no distension and no mass. There is no abdominal tenderness. There is no rebound and no guarding.  Musculoskeletal: Normal range of motion.        General: No tenderness or edema.  Neurological: She is alert and oriented to person, place, and time.  Skin: Skin is warm. There is pallor.  Psychiatric: Affect normal.     LABORATORY DATA:  I have reviewed the data as listed Lab Results  Component Value Date   WBC 32.2 (H) 11/26/2018   HGB 7.5 (L) 11/26/2018   HCT 26.3 (L) 11/26/2018   MCV 78.5 (L) 11/26/2018   PLT 31 (L) 11/26/2018   Recent Labs    11/20/18 0814 11/25/18 0904 11/25/18 1219 11/26/18 0418  NA 140 137 138 139  K 2.9* 3.7 3.8 3.6  CL 99 99 101 104  CO2 31 27 30 27   GLUCOSE 130* 159* 110* 103*  BUN 24* 24* 23 22  CREATININE 0.90 0.90 0.88 0.86   CALCIUM 10.3 10.1 10.5* 9.3  GFRNONAA 60* 60* >60 >60  GFRAA >60 >60 >60 >60  PROT 7.1 7.0 7.4  --   ALBUMIN 4.0 4.1 4.3  --   AST 39 37 39  --   ALT 14 13 13   --   ALKPHOS 65 62 66  --   BILITOT 0.7 0.5 0.9  --     RADIOGRAPHIC STUDIES: I have personally reviewed the radiological images as listed and agreed with the findings in the report. No results found.  AML (acute myeloid leukemia) with failed remission [facility]) #85 year old female patient with acute myeloid leukemia with monocytic differentiation [20% blasts on peripheral blood flow cytometry]-admit to the hospital for Vidaza plus venetoclax.  #Proceed with Vidaza subcu cycle#1 day 2 today.  White count 33,000; hemoglobin 7.5 platelets 32.   #Anemia-secondary leukemia proceed with 1 unit of PRBC transfusion.  #We will plan to start p.o. venetoclax tomorrow- [6/17]; and ramp-up; will check tumor lysis markers every 6 hours.  For now continue allopurinol.  Awaiting G6PD levels/in case if rasburicase needs to be administered.  I called the lab to inquire about the turnaround time of G6PD testing.  #Discussed with Dr. Estanislado Pandy.  Patient tentatively to be discharged on 6/20-if no tumor lysis noted.  Thank you Dr.Pyreddy for allowing me to participate in the care of your pleasant patient. Please do not hesitate to contact me with questions or concerns in the interim.   All questions were answered. The patient knows to call the clinic with any problems, questions or concerns.    Cammie Sickle, MD 11/26/2018 4:15 PM

## 2018-11-26 NOTE — Telephone Encounter (Signed)
Oral Chemotherapy Pharmacist Encounter  Patient Education Walk over to visit Ms. Schulke inpatient for overview of new oral chemotherapy medication: Venclexta (venetoclax) for the treatment of newly diagnosed AML, planned duration until disease progression or unacceptable drug toxicity. She has been admitted for treatment initiation and TLS monitoring.  Pt is doing well. Counseled patient on administration, dosing, side effects, monitoring, drug-food interactions, safe handling, storage, and disposal. Patient will take 100mg  daily on day 1, 200mg  day 2, and 400mg  day 3 and beyond. This is planned dose escalation the plan maybe altered based on patient response to treatment  Side effects include but not limited to: TLS, N/V, fatigue, decrease wbc.    Reviewed with patient importance of keeping a medication schedule and plan for any missed doses.  Ms. Hammer voiced understanding and appreciation. All questions answered. Medication handout provided and consent obtained.  Venetoclax was picked up from [facility] this morning. I brought the medication by the patient's room this morning for her to see the bottle then left the medication with the floor pharmacist to be stored and dispensed by the pharmacy. I reminded the patient that on discharge the medication should be given back to her to continue home administration. She stated her understanding on the plan.  Provided patient with [facility] phone number. Patient knows to call the office with questions or concerns. Oral Chemotherapy Navigation Clinic will continue to follow.  Darl Pikes, PharmD, BCPS, [facility] Hematology/Oncology Clinical Pharmacist [facility]/HP/AP [facility] [phone]  11/26/2018 1:37 PM

## 2018-11-26 NOTE — Assessment & Plan Note (Addendum)
#  85 year old female patient with acute myeloid leukemia with monocytic differentiation [20% blasts on peripheral blood flow cytometry]-admit to the hospital for Vidaza plus venetoclax.  #Proceed with Vidaza subcu cycle#1 day 8 today.  White count  4 hemoglobin 8.2 platelets 17 proceed with venetoclax day#6 with 400 mg today.  Patient to be discharged home on venetoclax 400 mg once a day.  Patient had last PRBC transfusion/platelet transfusion on 6/20.   #Anemia/thrombocytopenia -secondary leukemia -status post transfusion-as above.  #Tumor lysis prophylaxis-no evidence of any tumor lysis.  IV fluids could be discontinued.  #Given poor IV access recommend PICC line placement.  Patient agreement.  #Above plan of care was discussed with patient in detail.  Patient could potentially be discharged today.  Will discuss with the patient's husband.  We will follow the patient closely.

## 2018-11-27 ENCOUNTER — Inpatient Hospital Stay: Payer: Medicare Other

## 2018-11-27 LAB — BPAM RBC
Blood Product Expiration Date: 202007082359
ISSUE DATE / TIME: 202006161649
Unit Type and Rh: 5100

## 2018-11-27 LAB — TYPE AND SCREEN
ABO/RH(D): O POS
Antibody Screen: NEGATIVE
Unit division: 0

## 2018-11-27 LAB — BASIC METABOLIC PANEL
Anion gap: 8 (ref 5–15)
Anion gap: 10 (ref 5–15)
BUN: 23 mg/dL (ref 8–23)
BUN: 25 mg/dL — ABNORMAL HIGH (ref 8–23)
CO2: 26 mmol/L (ref 22–32)
CO2: 26 mmol/L (ref 22–32)
Calcium: 8.8 mg/dL — ABNORMAL LOW (ref 8.9–10.3)
Calcium: 8.9 mg/dL (ref 8.9–10.3)
Chloride: 106 mmol/L (ref 98–111)
Chloride: 102 mmol/L (ref 98–111)
Creatinine, Ser: 0.92 mg/dL (ref 0.44–1.00)
Creatinine, Ser: 0.94 mg/dL (ref 0.44–1.00)
GFR calc Af Amer: >60 mL/min (ref >60)
GFR calc Af Amer: >60 mL/min (ref >60)
GFR calc non Af Amer: 58 mL/min — ABNORMAL LOW (ref >60)
GFR calc non Af Amer: 56 mL/min — ABNORMAL LOW (ref >60)
Glucose, Bld: 103 mg/dL — ABNORMAL HIGH (ref 70–99)
Glucose, Bld: 102 mg/dL — ABNORMAL HIGH (ref 70–99)
Potassium: 3.5 mmol/L (ref 3.5–5.1)
Potassium: 4.3 mmol/L (ref 3.5–5.1)
Sodium: 140 mmol/L (ref 135–145)
Sodium: 138 mmol/L (ref 135–145)

## 2018-11-27 LAB — LACTATE DEHYDROGENASE: LDH: 656 U/L — ABNORMAL HIGH (ref 98–192)

## 2018-11-27 LAB — CBC
HCT: 29.1 % — ABNORMAL LOW (ref 36.0–46.0)
Hemoglobin: 8.6 g/dL — ABNORMAL LOW (ref 12.0–15.0)
MCH: 23.2 pg — ABNORMAL LOW (ref 26.0–34.0)
MCHC: 29.6 g/dL — ABNORMAL LOW (ref 30.0–36.0)
MCV: 78.6 fL — ABNORMAL LOW (ref 80.0–100.0)
Platelets: 27 10*3/uL — CL (ref 150–400)
RBC: 3.7 MIL/uL — ABNORMAL LOW (ref 3.87–5.11)
RDW: 18.6 % — ABNORMAL HIGH (ref 11.5–15.5)
WBC: 30.9 10*3/uL — ABNORMAL HIGH (ref 4.0–10.5)
nRBC: 1.1 % — ABNORMAL HIGH (ref 0.0–0.2)

## 2018-11-27 LAB — MAGNESIUM: Magnesium: 1.9 mg/dL (ref 1.7–2.4)

## 2018-11-27 LAB — CK: Total CK: 15 U/L — ABNORMAL LOW (ref 38–234)

## 2018-11-27 LAB — URIC ACID: Uric Acid, Serum: 2.7 mg/dL (ref 2.5–7.1)

## 2018-11-27 LAB — PHOSPHORUS: Phosphorus: 4.6 mg/dL (ref 2.5–4.6)

## 2018-11-27 MED ORDER — POTASSIUM CHLORIDE CRYS ER 20 MEQ PO TBCR
20.0000 meq | EXTENDED_RELEASE_TABLET | Freq: Once | ORAL | Status: AC
  Administered 2018-11-27: 08:00:00 20 meq via ORAL

## 2018-11-27 MED ORDER — VENETOCLAX 100 MG PO TABS
100.0000 mg | ORAL_TABLET | Freq: Once | ORAL | Status: AC
  Administered 2018-11-27: 11:00:00 100 mg via ORAL
  Filled 2018-11-27 (×2): qty 1

## 2018-11-27 MED ORDER — ONDANSETRON HCL 4 MG PO TABS
8.0000 mg | ORAL_TABLET | Freq: Once | ORAL | Status: AC
  Administered 2018-11-27: 8 mg via ORAL
  Filled 2018-11-27: qty 2

## 2018-11-27 MED ORDER — AZACITIDINE CHEMO SQ INJECTION
75.0000 mg/m2 | Freq: Once | INTRAMUSCULAR | Status: AC
  Administered 2018-11-27: 15:00:00 105 mg via SUBCUTANEOUS
  Filled 2018-11-27: qty 4.2

## 2018-11-27 NOTE — Progress Notes (Signed)
[place] [facility] [place] NAME: Kathryn Hahn    MR#:  [id number]  DATE OF BIRTH:  Jul 21, 1933  SUBJECTIVE:  Patient seen and evaluated today No complaints of chest pain No shortness of breath No fever No nausea and vomiting Appetite ok No constpation  REVIEW OF SYSTEMS:    ROS  CONSTITUTIONAL: No documented fever. No fatigue, weakness. No weight gain, no weight loss.  EYES: No blurry or double vision.  ENT: No tinnitus. No postnasal drip. No redness of the oropharynx.  RESPIRATORY: No cough, no wheeze, no hemoptysis. No dyspnea.  CARDIOVASCULAR: No chest pain. No orthopnea. No palpitations. No syncope.  GASTROINTESTINAL: No nausea, no vomiting or diarrhea. No abdominal pain. No melena or hematochezia.  GENITOURINARY: No dysuria or hematuria.  ENDOCRINE: No polyuria or nocturia. No heat or cold intolerance.  HEMATOLOGY: No anemia. No bruising. No bleeding.  INTEGUMENTARY: No rashes. No lesions.  MUSCULOSKELETAL: No arthritis. No swelling. No gout.  NEUROLOGIC: No numbness, tingling, or ataxia. No seizure-type activity.  PSYCHIATRIC: No anxiety. No insomnia. No ADD.   DRUG ALLERGIES:   Allergies  Allergen Reactions  . Terbinafine Rash and Swelling    VITALS:  Blood pressure 132/70, pulse 72, temperature 98.6 F (37 C), temperature source Oral, resp. rate 16, height 4\' 9"  (1.448 m), weight 42.8 kg, SpO2 (!) 88 %.  PHYSICAL EXAMINATION:   Physical Exam  GENERAL:  85 y.o.-year-old patient lying in the bed with no acute distress.  EYES: Pupils equal, round, reactive to light and accommodation. No scleral icterus. Extraocular muscles intact.  HEENT: Head atraumatic, normocephalic. Oropharynx and nasopharynx clear.  NECK:  Supple, no jugular venous distention. No thyroid enlargement, no tenderness.  LUNGS: Normal breath sounds bilaterally, no wheezing, rales, rhonchi. No use of accessory muscles of respiration.  CARDIOVASCULAR: S1,  S2 normal. No murmurs, rubs, or gallops.  ABDOMEN: Soft, nontender, nondistended. Bowel sounds present. No organomegaly or mass.  EXTREMITIES: No cyanosis, clubbing or edema b/l.    NEUROLOGIC: Cranial nerves II through XII are intact. No focal Motor or sensory deficits b/l.   PSYCHIATRIC: The patient is alert and oriented x 3.  SKIN: No obvious rash, lesion, or ulcer.   LABORATORY PANEL:   CBC Recent Labs  Lab 11/27/18 0404  WBC 30.9*  HGB 8.6*  HCT 29.1*  PLT 27*   ------------------------------------------------------------------------------------------------------------------ Chemistries  Recent Labs  Lab 11/25/18 0904 11/25/18 1219  11/27/18 0404  NA 137 138   < > 140  K 3.7 3.8   < > 3.5  CL 99 101   < > 106  CO2 27 30   < > 26  GLUCOSE 159* 110*   < > 103*  BUN 24* 23   < > 23  CREATININE 0.90 0.88   < > 0.92  CALCIUM 10.1 10.5*   < > 8.8*  MG 1.8  --   --   --   AST 37 39  --   --   ALT 13 13  --   --   ALKPHOS 62 66  --   --   BILITOT 0.5 0.9  --   --    < > = values in this interval not displayed.   ------------------------------------------------------------------------------------------------------------------  Cardiac Enzymes No results for input(s): TROPONINI in the last 168 hours. ------------------------------------------------------------------------------------------------------------------  RADIOLOGY:  No results found.   ASSESSMENT AND PLAN:  85 year old female patient with history of myeloproliferative neoplasm, progressive leukocytosis, breast cancer, hypertension, hyperlipidemia currently  under hospitalist service  -Myeloproliferative neoplasm Oncology follow-up appreciated Chemotherapy Vidaza injection [place] given Venetoclax tabs Watch for nausea vomiting and cell counts  -Hypokalemia Replace potassium orally  -Elevated uric acid levels Watch for tumor lysis syndrome IV fluids Allopurinol on board Nephrology notified  -Chronic  thrombocytopenia Monitor platelet count  -DVT prophylaxis sequential compression device to lower extremities  -Anemia Chronic S/p prbc transfusion Iron supplements  All the records are reviewed and case discussed with Care Management/Social Worker. Management plans discussed with the patient, family and they are in agreement.  CODE STATUS: Full code  DVT Prophylaxis: SCDs  TOTAL TIME TAKING CARE OF THIS PATIENT: 34 minutes.   POSSIBLE D/C IN 2 to 3 DAYS, DEPENDING ON CLINICAL CONDITION.  Saundra Shelling M.D on 11/27/2018 at 10:12 AM  Between 7am to 6pm - Pager - [phone]  After 6pm go to [email or web address] - password [email or web address] [place] [facility]  Office  [phone]  CC: Primary care physician; Rusty Aus, MD  Note: This dictation was prepared with Dragon dictation along with smaller phrase technology. Any transcriptional errors that result from this process are unintentional.

## 2018-11-27 NOTE — Progress Notes (Signed)
Kathryn Hahn   DOB:May 02, 1934   BC#:[id number]    Subjective: Patient denies any blood in stools or black or stools but denies any.  Complains of easy bruising.  Appetite is fair.  Patient's blood transfusion yesterday was uneventful.  Objective:  Vitals:   11/27/18 0504 11/27/18 1543  BP: 132/70 (!) 146/76  Pulse: 72 70  Resp: 16 18  Temp: 98.6 F (37 C) 98.4 F (36.9 C)  SpO2: (!) 88% 100%     Intake/Output Summary (Last 24 hours) at 11/27/2018 1610 Last data filed at 11/27/2018 1138 Gross per 24 hour  Intake 3067.37 ml  Output -  Net 3067.37 ml    Physical Exam  Constitutional: She is oriented to person, place, and time and well-developed, well-nourished, and in no distress.  HENT:  Head: Normocephalic and atraumatic.  Mouth/Throat: Oropharynx is clear and moist. No oropharyngeal exudate.  Eyes: Pupils are equal, round, and reactive to light.  Neck: Normal range of motion. Neck supple.  Cardiovascular: Normal rate and regular rhythm.  Pulmonary/Chest: No respiratory distress. She has no wheezes.  Abdominal: Soft. Bowel sounds are normal. She exhibits no distension and no mass. There is no abdominal tenderness. There is no rebound and no guarding.  Musculoskeletal: Normal range of motion.        General: No tenderness or edema.  Neurological: She is alert and oriented to person, place, and time.  Skin: Skin is warm. There is pallor.  Psychiatric: Affect normal.     Labs:  Lab Results  Component Value Date   WBC 30.9 (H) 11/27/2018   HGB 8.6 (L) 11/27/2018   HCT 29.1 (L) 11/27/2018   MCV 78.6 (L) 11/27/2018   PLT 27 (LL) 11/27/2018   NEUTROABS 21.8 (H) 11/25/2018    Lab Results  Component Value Date   NA 140 11/27/2018   K 3.5 11/27/2018   CL 106 11/27/2018   CO2 26 11/27/2018    Studies:  No results found.  AML (acute myeloid leukemia) with failed remission [facility]) #85 year old female patient with acute myeloid leukemia with monocytic differentiation  [20% blasts on peripheral blood flow cytometry]-admit to the hospital for Vidaza plus venetoclax.  #Proceed with Vidaza subcu cycle#1 day 3 today.  White count 33,000; hemoglobin 8.5 platelets 29,000.  Proceed with venetoclax day number 100 mg today.  See discussion below  #Anemia-secondary leukemia -status post 1 unit of PRBC transfusion hemoglobin 8.5.  #Tumor lysis prophylaxis-allopurinol IV fluids; repeat uric acid normal.  Discussed with Dr. Juleen China who kindly agrees to evaluate the patient.  We will repeat chemistries magnesium LDH phosphorus every 8 hours.  Discussed with the patient.  Also discussed with pharmacist.  Recommend telemetry.   Cammie Sickle, MD 11/27/2018  4:10 PM

## 2018-11-27 NOTE — Progress Notes (Signed)
Kathryn Hahn with the Lab called to report a critical Platelet count of 27. Paged provider made him aware. no new orders at this time

## 2018-11-27 NOTE — Consult Note (Signed)
[facility]  CONSULT NOTE    Date: 11/27/2018                  Patient Name:  Kathryn Hahn  MRN: [id number]  DOB: [birth year]  Age / Sex: 85 y.o., female         PCP: Rusty Aus, MD                 Service Requesting Consult: Dr. Rogue Bussing                 Reason for Consult: Tumor Lysis            History of Present Illness: Ms. Prue Cater admitted to [facility] from [place] and Venetoclax. Nephrology consulted for tumor lysis prevention.   PRBC transfusion. Started on IV fluids  Patient has no complaints and is nervous about going forward.    Medications: Outpatient medications: Medications Prior to Admission  Medication Sig Dispense Refill Last Dose  . allopurinol (ZYLOPRIM) 300 MG tablet Take 1 tablet (300 mg total) by mouth 2 (two) times daily. 120 tablet 0 11/25/2018 at 0730  . atenolol (TENORMIN) 50 MG tablet Take 1 tablet by mouth 2 (two) times daily.   11/25/2018 at 0730  . CALCIUM-VITAMIN D PO Take 1 tablet by mouth daily.    11/25/2018 at 0730  . Cholecalciferol (VITAMIN D) 2000 units tablet Take 1 tablet by mouth daily.   11/25/2018 at 0730  . iron polysaccharides (FERREX 150) 150 MG capsule Take 1 capsule (150 mg total) by mouth daily. (Patient taking differently: Take 150 mg by mouth 2 (two) times daily. ) 90 capsule 1 11/25/2018 at 0730  . losartan-hydrochlorothiazide (HYZAAR) 50-12.5 MG tablet Take 1 tablet by mouth daily.   11/25/2018 at 0730  . Omega-3 Fatty Acids (FISH OIL PO) Take 1 capsule by mouth daily.   11/25/2018 at 0730  . potassium chloride SA (K-DUR) 20 MEQ tablet 1 pill twice a day 30 tablet 3 11/25/2018 at 0730  . vitamin C (ASCORBIC ACID) 500 MG tablet Take 1 tablet by mouth 2 (two) times daily.   11/25/2018 at 0730  . magic mouthwash w/lidocaine SOLN Take 5 mLs by mouth 4 (four) times daily as needed for mouth pain. 480 mL 3 prn at prn  . venetoclax 100 MG TABS Take 400 mg by mouth daily. Take as directed. 120 tablet 0      Current medications: Current Facility-Administered Medications  Medication Dose Route Frequency Provider Last Rate Last Dose  . 0.9 %  sodium chloride infusion   Intravenous Continuous Pyreddy, Reatha Harps, MD 75 mL/hr at 11/27/18 1138    . acetaminophen (TYLENOL) tablet 650 mg  650 mg Oral Q6H PRN Saundra Shelling, MD       Or  . acetaminophen (TYLENOL) suppository 650 mg  650 mg Rectal Q6H PRN Pyreddy, Reatha Harps, MD      . allopurinol (ZYLOPRIM) tablet 300 mg  300 mg Oral BID Saundra Shelling, MD   300 mg at 11/27/18 0803  . atenolol (TENORMIN) tablet 50 mg  50 mg Oral BID Saundra Shelling, MD   50 mg at 11/27/18 0804  . calcium-vitamin D (OSCAL WITH D) 500-200 MG-UNIT per tablet 1 tablet  1 tablet Oral Daily Saundra Shelling, MD   1 tablet at 11/27/18 0803  . cholecalciferol (VITAMIN D3) tablet 2,000 Units  2,000 Units Oral Daily Saundra Shelling, MD   2,000 Units at 11/27/18 0803  . heparin lock flush 100 unit/mL  500 Units Intracatheter Daily PRN Charlaine Dalton R, MD      . heparin lock flush 100 unit/mL  250 Units Intracatheter PRN Charlaine Dalton R, MD      . iron polysaccharides (NIFEREX) capsule 150 mg  150 mg Oral BID Lance Coon, MD   150 mg at 11/27/18 0804  . losartan (COZAAR) tablet 50 mg  50 mg Oral Daily Pyreddy, Reatha Harps, MD   50 mg at 11/27/18 0803  . magic mouthwash w/lidocaine  5 mL Oral QID PRN Saundra Shelling, MD      . omega-3 acid ethyl esters (LOVAZA) capsule 1 g  1 g Oral Daily Pyreddy, Pavan, MD   1 g at 11/27/18 0804  . ondansetron (ZOFRAN) tablet 4 mg  4 mg Oral Q6H PRN Pyreddy, Reatha Harps, MD       Or  . ondansetron (ZOFRAN) injection 4 mg  4 mg Intravenous Q6H PRN Pyreddy, Pavan, MD      . senna-docusate (Senokot-S) tablet 1 tablet  1 tablet Oral QHS PRN Pyreddy, Reatha Harps, MD      . sodium chloride flush (NS) 0.9 % injection 10 mL  10 mL Intracatheter PRN Charlaine Dalton R, MD      . sodium chloride flush (NS) 0.9 % injection 3 mL  3 mL Intracatheter PRN Charlaine Dalton  R, MD      . vitamin C (ASCORBIC ACID) tablet 500 mg  500 mg Oral BID Saundra Shelling, MD   500 mg at 11/27/18 0803      Allergies: Allergies  Allergen Reactions  . Terbinafine Rash and Swelling      Past Medical History: Past Medical History:  Diagnosis Date  . Anemia   . Arthritis   . Breast cancer ([place]) 2003   RT LUMPECTOMY  . Edema leg   . History of breast cancer 2003   post lumpectomy  . History of kidney stones   . Hyperlipemia, mixed   . Hypertension, essential   . Leukemia ([place])   . Leukocytosis   . MDS (myelodysplastic syndrome) ([place])   . Personal history of radiation therapy 2003   BREAST CA  . Renal stones   . Vitamin D deficiency      Past Surgical History: Past Surgical History:  Procedure Laterality Date  . BREAST BIOPSY Right 2003   Postive for cancer  . BREAST LUMPECTOMY Right 2003   BREAST CA  . KIDNEY STONE SURGERY Right      Family History: Family History  Problem Relation Age of Onset  . Stroke Mother   . Hypertension Father   . Stroke Father   . Breast cancer Neg Hx      Social History: Social History   Socioeconomic History  . Marital status: Married    Spouse name: Not on file  . Number of children: Not on file  . Years of education: Not on file  . Highest education level: Not on file  Occupational History  . Not on file  Social Needs  . Financial resource strain: Not on file  . Food insecurity    Worry: Not on file    Inability: Not on file  . Transportation needs    Medical: Not on file    Non-medical: Not on file  Tobacco Use  . Smoking status: Never Smoker  . Smokeless tobacco: Never Used  Substance and Sexual Activity  . Alcohol use: Yes    Alcohol/week: 3.0 standard drinks    Types: 3 Glasses of wine per  week  . Drug use: No  . Sexual activity: Not on file  Lifestyle  . Physical activity    Days per week: Not on file    Minutes per session: Not on file  . Stress: Not on file  Relationships  . Social  Herbalist on phone: Not on file    Gets together: Not on file    Attends religious service: Not on file    Active member of club or organization: Not on file    Attends meetings of clubs or organizations: Not on file    Relationship status: Not on file  . Intimate partner violence    Fear of current or ex partner: Not on file    Emotionally abused: Not on file    Physically abused: Not on file    Forced sexual activity: Not on file  Other Topics Concern  . Not on file  Social History Narrative    No smoking/alcohol; lives in [place] with family.  She lives in assisted living with her husband.     Review of Systems: Review of Systems  Constitutional: Negative.  Negative for chills, diaphoresis, fever, malaise/fatigue and weight loss.  HENT: Negative.  Negative for congestion, ear discharge, ear pain, hearing loss, nosebleeds, sinus pain, sore throat and tinnitus.   Eyes: Negative.  Negative for blurred vision, double vision, photophobia, pain, discharge and redness.  Respiratory: Negative.  Negative for cough, hemoptysis, sputum production, shortness of breath, wheezing and stridor.   Cardiovascular: Negative.  Negative for chest pain, palpitations, orthopnea, claudication, leg swelling and PND.  Gastrointestinal: Negative.  Negative for abdominal pain, blood in stool, constipation, diarrhea, heartburn, melena, nausea and vomiting.  Genitourinary: Negative.  Negative for dysuria, flank pain, frequency, hematuria and urgency.  Musculoskeletal: Negative.  Negative for back pain, falls, joint pain, myalgias and neck pain.  Skin: Negative.  Negative for itching and rash.  Neurological: Negative.  Negative for dizziness, tingling, tremors, sensory change, speech change, focal weakness, seizures, loss of consciousness, weakness and headaches.  Endo/Heme/Allergies: Negative.  Negative for environmental allergies and polydipsia. Does not bruise/bleed easily.   Psychiatric/Behavioral: Negative.  Negative for depression, hallucinations, memory loss, substance abuse and suicidal ideas. The patient is not nervous/anxious and does not have insomnia.     Vital Signs: Blood pressure (!) 146/76, pulse 70, temperature 98.4 F (36.9 C), resp. rate 18, height 4\' 9"  (1.448 m), weight 42.8 kg, SpO2 100 %.  Weight trends: Filed Weights   11/25/18 1625  Weight: 42.8 kg    Physical Exam: General: NAD,   Head: Normocephalic, atraumatic. Moist oral mucosal membranes  Eyes: Anicteric, PERRL  Neck: Supple, trachea midline  Lungs:  Clear to auscultation  Heart: Regular rate and rhythm  Abdomen:  Soft, nontender,   Extremities: Trace peripheral edema.  Neurologic: Nonfocal, moving all four extremities  Skin: No lesions     Lab results: Basic Metabolic Panel: Recent Labs  Lab 11/25/18 0904 11/25/18 1219 11/26/18 0418 11/27/18 0404  NA 137 138 139 140  K 3.7 3.8 3.6 3.5  CL 99 101 104 106  CO2 27 30 27 26   GLUCOSE 159* 110* 103* 103*  BUN 24* 23 22 23   CREATININE 0.90 0.88 0.86 0.92  CALCIUM 10.1 10.5* 9.3 8.8*  MG 1.8  --   --   --   PHOS 4.6  --   --   --     Liver Function Tests: Recent Labs  Lab 11/25/18 0904 11/25/18 1219  AST 37 39  ALT 13 13  ALKPHOS 62 66  BILITOT 0.5 0.9  PROT 7.0 7.4  ALBUMIN 4.1 4.3   No results for input(s): LIPASE, AMYLASE in the last 168 hours. No results for input(s): AMMONIA in the last 168 hours.  CBC: Recent Labs  Lab 11/25/18 0904 11/25/18 1219 11/26/18 0418 11/27/18 0404  WBC 33.8* 41.1* 32.2* 30.9*  NEUTROABS 15.9* 21.8*  --   --   HGB 8.5*  7.1* 9.1* 7.5* 8.6*  HCT 29.5* 31.3* 26.3* 29.1*  MCV 76.6* 76.9* 78.5* 78.6*  PLT 33* 34* 31* 27*    Cardiac Enzymes: Recent Labs  Lab 11/27/18 0404  CKTOTAL 15*    BNP: Invalid input(s): POCBNP  CBG: No results for input(s): GLUCAP in the last 168 hours.  Microbiology: No results found for this or any previous  visit.  Coagulation Studies: No results for input(s): LABPROT, INR in the last 72 hours.  Urinalysis: No results for input(s): COLORURINE, LABSPEC, PHURINE, GLUCOSEU, HGBUR, BILIRUBINUR, KETONESUR, PROTEINUR, UROBILINOGEN, NITRITE, LEUKOCYTESUR in the last 72 hours.  Invalid input(s): APPERANCEUR    Imaging:  No results found.   Assessment & Plan: Ms. Verneta Wiler is a 85 y.o. white female with AML, hypertension, anemia, who was [facility]/25/2018 for MDS acute Myeloid Leukemia  Nephrology consulted for renal protection and prevention of tumor lysis syndrome.  - Continue IV fluids - Discontinue hydrochlorothiazide - Monitor labs - Continue allopurinol.      LOS: 2 Tavious Griesinger 6/17/20204:28 PM

## 2018-11-28 ENCOUNTER — Ambulatory Visit: Payer: PRIVATE HEALTH INSURANCE

## 2018-11-28 LAB — PHOSPHORUS
Phosphorus: 4.7 mg/dL — ABNORMAL HIGH (ref 2.5–4.6)
Phosphorus: 5 mg/dL — ABNORMAL HIGH (ref 2.5–4.6)
Phosphorus: 5.1 mg/dL — ABNORMAL HIGH (ref 2.5–4.6)

## 2018-11-28 LAB — BASIC METABOLIC PANEL
Anion gap: 10 (ref 5–15)
Anion gap: 8 (ref 5–15)
Anion gap: 8 (ref 5–15)
BUN: 29 mg/dL — ABNORMAL HIGH (ref 8–23)
BUN: 31 mg/dL — ABNORMAL HIGH (ref 8–23)
BUN: 32 mg/dL — ABNORMAL HIGH (ref 8–23)
CO2: 24 mmol/L (ref 22–32)
CO2: 24 mmol/L (ref 22–32)
CO2: 24 mmol/L (ref 22–32)
Calcium: 8.5 mg/dL — ABNORMAL LOW (ref 8.9–10.3)
Calcium: 8.8 mg/dL — ABNORMAL LOW (ref 8.9–10.3)
Calcium: 9 mg/dL (ref 8.9–10.3)
Chloride: 102 mmol/L (ref 98–111)
Chloride: 105 mmol/L (ref 98–111)
Chloride: 106 mmol/L (ref 98–111)
Creatinine, Ser: 0.77 mg/dL (ref 0.44–1.00)
Creatinine, Ser: 0.83 mg/dL (ref 0.44–1.00)
Creatinine, Ser: 0.86 mg/dL (ref 0.44–1.00)
GFR calc Af Amer: >60 mL/min (ref >60)
GFR calc Af Amer: >60 mL/min (ref >60)
GFR calc Af Amer: >60 mL/min (ref >60)
GFR calc non Af Amer: >60 mL/min (ref >60)
GFR calc non Af Amer: >60 mL/min (ref >60)
GFR calc non Af Amer: >60 mL/min (ref >60)
Glucose, Bld: 109 mg/dL — ABNORMAL HIGH (ref 70–99)
Glucose, Bld: 109 mg/dL — ABNORMAL HIGH (ref 70–99)
Glucose, Bld: 119 mg/dL — ABNORMAL HIGH (ref 70–99)
Potassium: 3.3 mmol/L — ABNORMAL LOW (ref 3.5–5.1)
Potassium: 3.4 mmol/L — ABNORMAL LOW (ref 3.5–5.1)
Potassium: 3.8 mmol/L (ref 3.5–5.1)
Sodium: 134 mmol/L — ABNORMAL LOW (ref 135–145)
Sodium: 138 mmol/L (ref 135–145)
Sodium: 139 mmol/L (ref 135–145)

## 2018-11-28 LAB — URIC ACID
Uric Acid, Serum: 2.6 mg/dL (ref 2.5–7.1)
Uric Acid, Serum: 2.7 mg/dL (ref 2.5–7.1)
Uric Acid, Serum: 2.8 mg/dL (ref 2.5–7.1)

## 2018-11-28 LAB — CBC WITH DIFFERENTIAL/PLATELET

## 2018-11-28 LAB — LACTATE DEHYDROGENASE
LDH: 2004 U/L — ABNORMAL HIGH (ref 98–192)
LDH: 3682 U/L — ABNORMAL HIGH (ref 98–192)
LDH: 3784 U/L — ABNORMAL HIGH (ref 98–192)

## 2018-11-28 LAB — MAGNESIUM
Magnesium: 1.8 mg/dL (ref 1.7–2.4)
Magnesium: 1.9 mg/dL (ref 1.7–2.4)
Magnesium: 2 mg/dL (ref 1.7–2.4)

## 2018-11-28 MED ORDER — VENETOCLAX 100 MG PO TABS: 200.0000 mg | ORAL_TABLET | Freq: Once | ORAL | Status: DC

## 2018-11-28 MED ORDER — AZACITIDINE CHEMO SQ INJECTION
75.0000 mg/m2 | Freq: Once | INTRAMUSCULAR | Status: AC
  Administered 2018-11-28: 105 mg via SUBCUTANEOUS
  Filled 2018-11-28: qty 4.2

## 2018-11-28 MED ORDER — VENETOCLAX 100 MG PO TABS
200.0000 mg | ORAL_TABLET | Freq: Once | ORAL | Status: AC
  Administered 2018-11-28: 200 mg via ORAL
  Filled 2018-11-28: qty 2

## 2018-11-28 MED ORDER — ONDANSETRON HCL 4 MG PO TABS
8.0000 mg | ORAL_TABLET | Freq: Once | ORAL | Status: AC
  Administered 2018-11-28: 8 mg via ORAL
  Filled 2018-11-28: qty 2

## 2018-11-28 MED ORDER — SENNOSIDES-DOCUSATE SODIUM 8.6-50 MG PO TABS
2.0000 | ORAL_TABLET | Freq: Every evening | ORAL | Status: DC | PRN
  Administered 2018-11-28: 2 via ORAL
  Filled 2018-11-28: qty 2

## 2018-11-28 NOTE — Progress Notes (Signed)
[place] [facility] [place] NAME: Kathryn Hahn    MR#:  [id number]  DATE OF BIRTH:  April 05, 1934  SUBJECTIVE:  Patient seen and evaluated today No complaints of chest pain No shortness of breath No fever No nausea and vomiting Appetite ok No constpation Has some ecchymosis over the upper extremities  REVIEW OF SYSTEMS:    ROS  CONSTITUTIONAL: No documented fever. No fatigue, weakness. No weight gain, no weight loss.  EYES: No blurry or double vision.  ENT: No tinnitus. No postnasal drip. No redness of the oropharynx.  RESPIRATORY: No cough, no wheeze, no hemoptysis. No dyspnea.  CARDIOVASCULAR: No chest pain. No orthopnea. No palpitations. No syncope.  GASTROINTESTINAL: No nausea, no vomiting or diarrhea. No abdominal pain. No melena or hematochezia.  GENITOURINARY: No dysuria or hematuria.  ENDOCRINE: No polyuria or nocturia. No heat or cold intolerance.  HEMATOLOGY: No anemia. No bruising. No bleeding.  INTEGUMENTARY: No rashes. No lesions.  MUSCULOSKELETAL: No arthritis. No swelling. No gout.  Ecchymosis noted over upper extremities NEUROLOGIC: No numbness, tingling, or ataxia. No seizure-type activity.  PSYCHIATRIC: No anxiety. No insomnia. No ADD.   DRUG ALLERGIES:   Allergies  Allergen Reactions  . Terbinafine Rash and Swelling    VITALS:  Blood pressure 136/71, pulse 75, temperature 98 F (36.7 C), temperature source Oral, resp. rate 16, height 4\' 9"  (1.448 m), weight 42.8 kg, SpO2 93 %.  PHYSICAL EXAMINATION:   Physical Exam  GENERAL:  85 y.o.-year-old patient lying in the bed with no acute distress.  EYES: Pupils equal, round, reactive to light and accommodation. No scleral icterus. Extraocular muscles intact.  HEENT: Head atraumatic, normocephalic. Oropharynx and nasopharynx clear.  NECK:  Supple, no jugular venous distention. No thyroid enlargement, no tenderness.  LUNGS: Normal breath sounds bilaterally, no  wheezing, rales, rhonchi. No use of accessory muscles of respiration.  CARDIOVASCULAR: S1, S2 normal. No murmurs, rubs, or gallops.  ABDOMEN: Soft, nontender, nondistended. Bowel sounds present. No organomegaly or mass.  EXTREMITIES: No cyanosis, clubbing or edema b/l.    Ecchymosis NEUROLOGIC: Cranial nerves II through XII are intact. No focal Motor or sensory deficits b/l.   PSYCHIATRIC: The patient is alert and oriented x 3.  SKIN: No obvious rash, lesion, or ulcer.   LABORATORY PANEL:   CBC Recent Labs  Lab 11/28/18 0753  WBC 24.8*  HGB 8.7*  HCT 29.2*  PLT 24*   ------------------------------------------------------------------------------------------------------------------ Chemistries  Recent Labs  Lab 11/25/18 1219  11/28/18 0753  NA 138   < > 138  K 3.8   < > 3.3*  CL 101   < > 106  CO2 30   < > 24  GLUCOSE 110*   < > 109*  BUN 23   < > 29*  CREATININE 0.88   < > 0.86  CALCIUM 10.5*   < > 9.0  MG  --    < > 2.0  AST 39  --   --   ALT 13  --   --   ALKPHOS 66  --   --   BILITOT 0.9  --   --    < > = values in this interval not displayed.   ------------------------------------------------------------------------------------------------------------------  Cardiac Enzymes No results for input(s): TROPONINI in the last 168 hours. ------------------------------------------------------------------------------------------------------------------  RADIOLOGY:  No results found.   ASSESSMENT AND PLAN:  85 year old female patient with history of myeloproliferative neoplasm, progressive leukocytosis, breast cancer, hypertension, hyperlipidemia currently under hospitalist service  -  Myeloproliferative neoplasm Oncology follow-up appreciated Chemotherapy to continue Vidaza injection [place] given Venetoclax tabs Watch for nausea vomiting and cell counts  -Hypokalemia Replaced potassium orally  -Elevated uric acid levels Watch for tumor lysis syndrome IV  fluids Allopurinol on board Nephrology notified  -Chronic thrombocytopenia Monitor platelet count  -Dehydration IV fluids  -DVT prophylaxis sequential compression device to lower extremities  -Anemia Chronic S/p prbc transfusion Iron supplements Hemoglobin better.  All the records are reviewed and case discussed with Care Management/Social Worker. Management plans discussed with the patient, family and they are in agreement.  CODE STATUS: Full code  DVT Prophylaxis: SCDs  TOTAL TIME TAKING CARE OF THIS PATIENT: 36 minutes.   POSSIBLE D/C IN 2 to 3 DAYS, DEPENDING ON CLINICAL CONDITION.  Saundra Shelling M.D on 11/28/2018 at 11:47 AM  Between 7am to 6pm - Pager - [phone]  After 6pm go to [email or web address] - password [email or web address] [place] [facility]  Office  [phone]  CC: Primary care physician; Rusty Aus, MD  Note: This dictation was prepared with Dragon dictation along with smaller phrase technology. Any transcriptional errors that result from this process are unintentional.

## 2018-11-28 NOTE — Progress Notes (Signed)
Kathryn Hahn   DOB:09/24/1933   ON#:[id number]    Subjective: Patient had a blood transfusion yesterday uneventful.  No fevers or chills.  Appetite is good.  She is walking to the bathroom by herself.  She has been resting in in the chair most of the time.   Objective:  Vitals:   11/28/18 0435 11/28/18 1209  BP: 136/71 118/64  Pulse: 75 64  Resp: 16 (!) 21  Temp: 98 F (36.7 C) 97.7 F (36.5 C)  SpO2: 93% 97%     Intake/Output Summary (Last 24 hours) at 11/28/2018 1610 Last data filed at 11/28/2018 1008 Gross per 24 hour  Intake 1864.01 ml  Output -  Net 1864.01 ml    Physical Exam  Constitutional: She is oriented to person, place, and time and well-developed, well-nourished, and in no distress.  HENT:  Head: Normocephalic and atraumatic.  Mouth/Throat: Oropharynx is clear and moist. No oropharyngeal exudate.  Eyes: Pupils are equal, round, and reactive to light.  Neck: Normal range of motion. Neck supple.  Cardiovascular: Normal rate and regular rhythm.  Pulmonary/Chest: Effort normal and breath sounds normal. No respiratory distress. She has no wheezes.  Abdominal: Soft. Bowel sounds are normal. She exhibits no distension and no mass. There is no abdominal tenderness. There is no rebound and no guarding.  Musculoskeletal: Normal range of motion.        General: No tenderness or edema.  Neurological: She is alert and oriented to person, place, and time.  Skin: Skin is warm. There is pallor.  Multiple ecchymosis.;  Left arm swelling/IV infiltration; abdominal lumps-from subcu injections.  Psychiatric: Affect normal.     Labs:  Lab Results  Component Value Date   WBC 24.8 (H) 11/28/2018   HGB 8.7 (L) 11/28/2018   HCT 29.2 (L) 11/28/2018   MCV 77.7 (L) 11/28/2018   PLT 24 (LL) 11/28/2018   NEUTROABS 10.7 (H) 11/28/2018    Lab Results  Component Value Date   NA 138 11/28/2018   K 3.3 (L) 11/28/2018   CL 106 11/28/2018   CO2 24 11/28/2018    Studies:  No  results found.  AML (acute myeloid leukemia) with failed remission [facility]) #85 year old female patient with acute myeloid leukemia with monocytic differentiation [20% blasts on peripheral blood flow cytometry]-admit to the hospital for Vidaza plus venetoclax.  #Proceed with Vidaza subcu cycle#1 day 4 today.  White count 24; hemoglobin 8.7 platelets 24,000.  Proceed with venetoclax day#2 with 200 mg today.  See discussion below  #Anemia/thrombocytopenia -secondary leukemia -status post 1 unit of PRBC transfusion hemoglobin 8.7.  Platelet count trending at 24,000 no bleeding noted.  Continue close monitoring without transfusion.  #Tumor lysis prophylaxis-allopurinol IV fluids; no obvious lab evidence of tumor lysis noted.  Except LDH ~2000-3000 sec to tumor lysis.  We will continue monitor labs every 8 hours.  #Above plan of care was discussed with patient in detail.  Await potential discharge on 6/20-Saturday-if no evidence of tumor lysis noted.  She agrees.      Cammie Sickle, MD 11/28/2018  4:10 PM

## 2018-11-28 NOTE — Care Management Important Message (Signed)
Important Message  Patient Details  Name: Kathryn Hahn MRN: [id number] Date of Birth: May 12, 1934   Medicare Important Message Given:  Yes    [name] 11/28/2018, 10:06 AM

## 2018-11-29 ENCOUNTER — Ambulatory Visit: Payer: PRIVATE HEALTH INSURANCE

## 2018-11-29 ENCOUNTER — Telehealth: Payer: Self-pay | Admitting: *Deleted

## 2018-11-29 ENCOUNTER — Telehealth: Payer: Self-pay | Admitting: Internal Medicine

## 2018-11-29 DIAGNOSIS — D696 Thrombocytopenia, unspecified: Secondary | ICD-10-CM

## 2018-11-29 LAB — BASIC METABOLIC PANEL
Anion gap: 10 (ref 5–15)
Anion gap: 7 (ref 5–15)
Anion gap: 9 (ref 5–15)
BUN: 31 mg/dL — ABNORMAL HIGH (ref 8–23)
BUN: 32 mg/dL — ABNORMAL HIGH (ref 8–23)
BUN: 35 mg/dL — ABNORMAL HIGH (ref 8–23)
CO2: 23 mmol/L (ref 22–32)
CO2: 23 mmol/L (ref 22–32)
CO2: 25 mmol/L (ref 22–32)
Calcium: 8.9 mg/dL (ref 8.9–10.3)
Calcium: 9 mg/dL (ref 8.9–10.3)
Calcium: 9.1 mg/dL (ref 8.9–10.3)
Chloride: 103 mmol/L (ref 98–111)
Chloride: 106 mmol/L (ref 98–111)
Chloride: 107 mmol/L (ref 98–111)
Creatinine, Ser: 0.69 mg/dL (ref 0.44–1.00)
Creatinine, Ser: 0.71 mg/dL (ref 0.44–1.00)
Creatinine, Ser: 0.8 mg/dL (ref 0.44–1.00)
GFR calc Af Amer: >60 mL/min (ref >60)
GFR calc Af Amer: >60 mL/min (ref >60)
GFR calc Af Amer: >60 mL/min (ref >60)
GFR calc non Af Amer: >60 mL/min (ref >60)
GFR calc non Af Amer: >60 mL/min (ref >60)
GFR calc non Af Amer: >60 mL/min (ref >60)
Glucose, Bld: 109 mg/dL — ABNORMAL HIGH (ref 70–99)
Glucose, Bld: 112 mg/dL — ABNORMAL HIGH (ref 70–99)
Glucose, Bld: 113 mg/dL — ABNORMAL HIGH (ref 70–99)
Potassium: 3.5 mmol/L (ref 3.5–5.1)
Potassium: 3.7 mmol/L (ref 3.5–5.1)
Potassium: 4.1 mmol/L (ref 3.5–5.1)
Sodium: 136 mmol/L (ref 135–145)
Sodium: 138 mmol/L (ref 135–145)
Sodium: 139 mmol/L (ref 135–145)

## 2018-11-29 LAB — CBC WITH DIFFERENTIAL/PLATELET
Abs Immature Granulocytes: 1.01 10*3/uL — ABNORMAL HIGH (ref 0.00–0.07)
Abs Immature Granulocytes: 2.21 10*3/uL — ABNORMAL HIGH (ref 0.00–0.07)
Abs Immature Granulocytes: 0 10*3/uL (ref 0.00–0.07)
Basophils Absolute: 0 10*3/uL (ref 0.0–0.1)
Basophils Absolute: 0.1 10*3/uL (ref 0.0–0.1)
Basophils Absolute: 0 10*3/uL (ref 0.0–0.1)
Basophils Relative: 0 %
Basophils Relative: 0 %
Basophils Relative: 0 %
Blasts: 1 %
Eosinophils Absolute: 0 10*3/uL (ref 0.0–0.5)
Eosinophils Absolute: 0 10*3/uL (ref 0.0–0.5)
Eosinophils Absolute: 0 10*3/uL (ref 0.0–0.5)
Eosinophils Relative: 0 %
Eosinophils Relative: 0 %
Eosinophils Relative: 0 %
HCT: 25.6 % — ABNORMAL LOW (ref 36.0–46.0)
HCT: 29.2 % — ABNORMAL LOW (ref 36.0–46.0)
HCT: 24.5 % — ABNORMAL LOW (ref 36.0–46.0)
Hemoglobin: 7.7 g/dL — ABNORMAL LOW (ref 12.0–15.0)
Hemoglobin: 8.7 g/dL — ABNORMAL LOW (ref 12.0–15.0)
Hemoglobin: 7.3 g/dL — ABNORMAL LOW (ref 12.0–15.0)
Immature Granulocytes: 7 %
Immature Granulocytes: 9 %
Lymphocytes Relative: 14 %
Lymphocytes Relative: 20 %
Lymphocytes Relative: 20 %
Lymphs Abs: 2 10*3/uL (ref 0.7–4.0)
Lymphs Abs: 4.8 10*3/uL — ABNORMAL HIGH (ref 0.7–4.0)
Lymphs Abs: 1.9 10*3/uL (ref 0.7–4.0)
MCH: 23.1 pg — ABNORMAL LOW (ref 26.0–34.0)
MCH: 23.1 pg — ABNORMAL LOW (ref 26.0–34.0)
MCH: 23 pg — ABNORMAL LOW (ref 26.0–34.0)
MCHC: 30.1 g/dL (ref 30.0–36.0)
MCHC: 29.8 g/dL — ABNORMAL LOW (ref 30.0–36.0)
MCHC: 29.8 g/dL — ABNORMAL LOW (ref 30.0–36.0)
MCV: 76.9 fL — ABNORMAL LOW (ref 80.0–100.0)
MCV: 77.7 fL — ABNORMAL LOW (ref 80.0–100.0)
MCV: 77.3 fL — ABNORMAL LOW (ref 80.0–100.0)
Monocytes Absolute: 4.5 10*3/uL — ABNORMAL HIGH (ref 0.1–1.0)
Monocytes Absolute: 6.9 10*3/uL — ABNORMAL HIGH (ref 0.1–1.0)
Monocytes Absolute: 0.9 10*3/uL (ref 0.1–1.0)
Monocytes Relative: 32 %
Monocytes Relative: 28 %
Monocytes Relative: 10 %
Neutro Abs: 6.4 10*3/uL (ref 1.7–7.7)
Neutro Abs: 10.7 10*3/uL — ABNORMAL HIGH (ref 1.7–7.7)
Neutro Abs: 5.1 10*3/uL (ref 1.7–7.7)
Neutrophils Relative %: 47 %
Neutrophils Relative %: 43 %
Neutrophils Relative %: 55 %
Other: 14 %
Platelets: 17 10*3/uL — CL (ref 150–400)
Platelets: 24 10*3/uL — CL (ref 150–400)
Platelets: 12 10*3/uL — CL (ref 150–400)
RBC: 3.33 MIL/uL — ABNORMAL LOW (ref 3.87–5.11)
RBC: 3.76 MIL/uL — ABNORMAL LOW (ref 3.87–5.11)
RBC: 3.17 MIL/uL — ABNORMAL LOW (ref 3.87–5.11)
RDW: 19.3 % — ABNORMAL HIGH (ref 11.5–15.5)
RDW: 19 % — ABNORMAL HIGH (ref 11.5–15.5)
RDW: 19.5 % — ABNORMAL HIGH (ref 11.5–15.5)
Smear Review: DECREASED
Smear Review: DECREASED
WBC: 13.9 10*3/uL — ABNORMAL HIGH (ref 4.0–10.5)
WBC: 24.8 10*3/uL — ABNORMAL HIGH (ref 4.0–10.5)
WBC: 9.3 10*3/uL (ref 4.0–10.5)
nRBC: 1.2 % — ABNORMAL HIGH (ref 0.0–0.2)
nRBC: 1.2 % — ABNORMAL HIGH (ref 0.0–0.2)

## 2018-11-29 LAB — PHOSPHORUS
Phosphorus: 4.8 mg/dL — ABNORMAL HIGH (ref 2.5–4.6)
Phosphorus: 4.9 mg/dL — ABNORMAL HIGH (ref 2.5–4.6)
Phosphorus: 5 mg/dL — ABNORMAL HIGH (ref 2.5–4.6)

## 2018-11-29 LAB — URIC ACID
Uric Acid, Serum: 2.6 mg/dL (ref 2.5–7.1)
Uric Acid, Serum: 2.6 mg/dL (ref 2.5–7.1)
Uric Acid, Serum: 2.8 mg/dL (ref 2.5–7.1)

## 2018-11-29 LAB — MAGNESIUM
Magnesium: 2 mg/dL (ref 1.7–2.4)
Magnesium: 2 mg/dL (ref 1.7–2.4)
Magnesium: 2.1 mg/dL (ref 1.7–2.4)

## 2018-11-29 LAB — LACTATE DEHYDROGENASE
LDH: 3065 U/L — ABNORMAL HIGH (ref 98–192)
LDH: 3383 U/L — ABNORMAL HIGH (ref 98–192)
LDH: 3091 U/L — ABNORMAL HIGH (ref 98–192)

## 2018-11-29 LAB — PATHOLOGIST SMEAR REVIEW

## 2018-11-29 MED ORDER — SODIUM CHLORIDE 0.9% FLUSH: 3.0000 mL | INTRAVENOUS | Status: DC | PRN

## 2018-11-29 MED ORDER — SODIUM CHLORIDE 0.9% IV SOLUTION
250.0000 mL | Freq: Once | INTRAVENOUS | Status: AC
  Administered 2018-11-29: 250 mL via INTRAVENOUS

## 2018-11-29 MED ORDER — HEPARIN SOD (PORK) LOCK FLUSH 100 UNIT/ML IV SOLN: 500.0000 [IU] | Freq: Every day | INTRAVENOUS | Status: DC | PRN

## 2018-11-29 MED ORDER — POLYETHYLENE GLYCOL 3350 17 G PO PACK
17.0000 g | PACK | Freq: Every day | ORAL | Status: DC
  Administered 2018-11-29 – 2018-11-30 (×2): 17 g via ORAL
  Filled 2018-11-29 (×4): qty 1

## 2018-11-29 MED ORDER — FLEET ENEMA 7-19 GM/118ML RE ENEM
1.0000 | ENEMA | Freq: Every day | RECTAL | Status: DC | PRN
  Administered 2018-11-29: 17:00:00 1 via RECTAL
  Filled 2018-11-29: qty 1

## 2018-11-29 MED ORDER — VENETOCLAX 100 MG PO TABS
400.0000 mg | ORAL_TABLET | Freq: Once | ORAL | Status: AC
  Administered 2018-11-29: 400 mg via ORAL
  Filled 2018-11-29: qty 4

## 2018-11-29 MED ORDER — AZACITIDINE CHEMO SQ INJECTION
75.0000 mg/m2 | Freq: Once | INTRAMUSCULAR | Status: AC
  Administered 2018-11-29: 105 mg via SUBCUTANEOUS
  Filled 2018-11-29: qty 4.2

## 2018-11-29 MED ORDER — SENNOSIDES-DOCUSATE SODIUM 8.6-50 MG PO TABS
2.0000 | ORAL_TABLET | Freq: Two times a day (BID) | ORAL | Status: DC
  Administered 2018-11-29 – 2018-12-02 (×6): 2 via ORAL
  Filled 2018-11-29 (×7): qty 2

## 2018-11-29 MED ORDER — HEPARIN SOD (PORK) LOCK FLUSH 100 UNIT/ML IV SOLN: 250.0000 [IU] | INTRAVENOUS | Status: DC | PRN

## 2018-11-29 MED ORDER — ONDANSETRON HCL 4 MG PO TABS
8.0000 mg | ORAL_TABLET | Freq: Once | ORAL | Status: AC
  Administered 2018-11-29: 8 mg via ORAL
  Filled 2018-11-29: qty 2

## 2018-11-29 MED ORDER — SODIUM CHLORIDE 0.9% FLUSH: 10.0000 mL | INTRAVENOUS | Status: DC | PRN

## 2018-11-29 NOTE — Telephone Encounter (Signed)
I will call 

## 2018-11-29 NOTE — Telephone Encounter (Signed)
Spoke to pt's husband; updated of patient's clinical status- severe anemia/thrombocytopenia as expected from chemo [place] today d-5/5 of ccyle #1 + Venatoclax- day#3 400mg /day].   Will proceed platelet transfusion today- 12  Plan PRBC transfusion in AM.   If clinically stable  and no evidence of tumor lysis- pt can be discharged tomorrow [6/20]. Continue venatoclax at 400 mg/day.   # will plan follow up in cancer center-MD/labs on 6/22-Monday.

## 2018-11-29 NOTE — Progress Notes (Signed)
CRITICAL VALUE ALERT  Critical Value: Platelet Count of 17  Date & Time Notied: 0118 11/29/2018  Provider Notified: A. Seals, NP  Orders Received/Actions taken:

## 2018-11-29 NOTE — Progress Notes (Signed)
Pt received chemo today sq.  tol well. plts 12  md notified. md ordered platelets for pt.  Gave pt a fleet enema. Pt had med  Size bm.

## 2018-11-29 NOTE — Progress Notes (Signed)
Kathryn Hahn   DOB:1934/01/01   FG#:[id number]    Subjective: Patient denies any fevers or chills.  Appetite is good.  No weight loss.  Complains of easy bruising and pain from injections.  Objective:  Vitals:   11/29/18 1540 11/29/18 1624  BP: (!) 149/80 (!) 163/92  Pulse: 72 72  Resp: 20 16  Temp: 97.7 F (36.5 C) 98.1 F (36.7 C)  SpO2: 100% 95%     Intake/Output Summary (Last 24 hours) at 11/29/2018 1641 Last data filed at 11/29/2018 1328 Gross per 24 hour  Intake 870 ml  Output -  Net 870 ml    Physical Exam  Constitutional: She is oriented to person, place, and time and well-developed, well-nourished, and in no distress.  Patient walking in the room.  HENT:  Head: Normocephalic and atraumatic.  Mouth/Throat: Oropharynx is clear and moist. No oropharyngeal exudate.  Eyes: Pupils are equal, round, and reactive to light.  Neck: Normal range of motion. Neck supple.  Cardiovascular: Normal rate and regular rhythm.  Pulmonary/Chest: No respiratory distress. She has no wheezes.  Abdominal: Soft. Bowel sounds are normal. She exhibits no distension and no mass. There is no abdominal tenderness. There is no rebound and no guarding.  Musculoskeletal: Normal range of motion.        General: No tenderness or edema.  Neurological: She is alert and oriented to person, place, and time.  Skin: Skin is warm.  Multiple bruises noted on the forearms on the right side and also abdomen.-From subcu injections.  Psychiatric: Affect normal.     Labs:  Lab Results  Component Value Date   WBC 13.9 (H) 11/29/2018   HGB 7.7 (L) 11/29/2018   HCT 25.6 (L) 11/29/2018   MCV 76.9 (L) 11/29/2018   PLT 17 (LL) 11/29/2018   NEUTROABS 6.4 11/29/2018    Lab Results  Component Value Date   NA 136 11/29/2018   K 3.5 11/29/2018   CL 103 11/29/2018   CO2 23 11/29/2018    Studies:  No results found.  AML (acute myeloid leukemia) with failed remission [facility]) #85 year old female patient  with acute myeloid leukemia with monocytic differentiation [20% blasts on peripheral blood flow cytometry]-admit to the hospital for Vidaza plus venetoclax.  #Proceed with Vidaza subcu cycle#1 day 5 today.  White count 13 hemoglobin 7.7 platelets 17 proceed with venetoclax day#3 with 400 mg today.  Patient to be discharged home on venetoclax 400 mg once a day.  See discussion below  #Anemia/thrombocytopenia -secondary leukemia -repeat CBC pending today.  We will plan to proceed with PRBC transfusion if hemoglobin less than 8; or if platelets 10-15,000.  #Tumor lysis prophylaxis-allopurinol IV fluids; no obvious lab evidence of tumor lysis noted.  Except LDH ~2000-3000 sec to tumor lysis.  We will continue monitor labs every 8 hours.  #Above plan of care was discussed with patient in detail.  Await potential discharge on 6/20-Saturday-if no evidence of tumor lysis noted.  She agrees.  Will discuss with the patient's husband.   Cammie Sickle, MD 11/29/2018  4:41 PM

## 2018-11-29 NOTE — Progress Notes (Addendum)
[place] [facility] [place] NAME: Kathryn Hahn    MR#:  [id number]  DATE OF BIRTH:  [birth year]  SUBJECTIVE:  Patient seen and evaluated today No complaints of chest pain No shortness of breath No fever No nausea and vomiting Appetite ok Has constpation Has some ecchymosis over the upper extremities secondary to needle sticks  REVIEW OF SYSTEMS:    ROS  CONSTITUTIONAL: No documented fever. No fatigue, weakness. No weight gain, no weight loss.  EYES: No blurry or double vision.  ENT: No tinnitus. No postnasal drip. No redness of the oropharynx.  RESPIRATORY: No cough, no wheeze, no hemoptysis. No dyspnea.  CARDIOVASCULAR: No chest pain. No orthopnea. No palpitations. No syncope.  GASTROINTESTINAL: No nausea, no vomiting or diarrhea. No abdominal pain. No melena or hematochezia.  GENITOURINARY: No dysuria or hematuria.  ENDOCRINE: No polyuria or nocturia. No heat or cold intolerance.  HEMATOLOGY: No anemia. No bruising. No bleeding.  INTEGUMENTARY: No rashes. No lesions.  MUSCULOSKELETAL: No arthritis. No swelling. No gout.  Ecchymosis noted over upper extremities NEUROLOGIC: No numbness, tingling, or ataxia. No seizure-type activity.  PSYCHIATRIC: No anxiety. No insomnia. No ADD.   DRUG ALLERGIES:   Allergies  Allergen Reactions  . Terbinafine Rash and Swelling    VITALS:  Blood pressure 132/74, pulse 70, temperature 98.6 F (37 C), temperature source Oral, resp. rate 18, height 4\' 9"  (1.448 m), weight 42.8 kg, SpO2 90 %.  PHYSICAL EXAMINATION:   Physical Exam  GENERAL:  85 y.o.-year-old patient lying in the bed with no acute distress.  EYES: Pupils equal, round, reactive to light and accommodation. No scleral icterus. Extraocular muscles intact.  HEENT: Head atraumatic, normocephalic. Oropharynx and nasopharynx clear.  NECK:  Supple, no jugular venous distention. No thyroid enlargement, no tenderness.  LUNGS: Normal breath  sounds bilaterally, no wheezing, rales, rhonchi. No use of accessory muscles of respiration.  CARDIOVASCULAR: S1, S2 normal. No murmurs, rubs, or gallops.  ABDOMEN: Soft, nontender, nondistended. Bowel sounds present. No organomegaly or mass.  EXTREMITIES: No cyanosis, clubbing or edema b/l.    Ecchymosis NEUROLOGIC: Cranial nerves II through XII are intact. No focal Motor or sensory deficits b/l.   PSYCHIATRIC: The patient is alert and oriented x 3.  SKIN: No obvious rash, lesion, or ulcer.   LABORATORY PANEL:   CBC Recent Labs  Lab 11/29/18 0012  WBC 13.9*  HGB 7.7*  HCT 25.6*  PLT 17*   ------------------------------------------------------------------------------------------------------------------ Chemistries  Recent Labs  Lab 11/25/18 1219  11/29/18 0819  NA 138   < > 139  K 3.8   < > 3.7  CL 101   < > 107  CO2 30   < > 23  GLUCOSE 110*   < > 109*  BUN 23   < > 31*  CREATININE 0.88   < > 0.69  CALCIUM 10.5*   < > 9.1  MG  --    < > 2.1  AST 39  --   --   ALT 13  --   --   ALKPHOS 66  --   --   BILITOT 0.9  --   --    < > = values in this interval not displayed.   ------------------------------------------------------------------------------------------------------------------  Cardiac Enzymes No results for input(s): TROPONINI in the last 168 hours. ------------------------------------------------------------------------------------------------------------------  RADIOLOGY:  No results found.   ASSESSMENT AND PLAN:  85 year old female patient with history of myeloproliferative neoplasm, progressive leukocytosis, breast cancer, hypertension, hyperlipidemia currently  under hospitalist service  -Myeloproliferative neoplasm Oncology follow-up appreciated Chemotherapy to continue Vidaza injection [place] given Venetoclax tabs Watch for nausea vomiting and cell counts Monitor LDH, uric acid levels  -Hypokalemia Replaced potassium orally  -Elevated uric acid  levels Watch for tumor lysis syndrome IV fluids Allopurinol on board Nephrology notified  - Thrombocytopenia Monitor platelet count Platelet transfusion today  -Dehydration IV fluids  -DVT prophylaxis sequential compression device to lower extremities  -Anemia Chronic S/p prbc transfusion Iron supplements Hemoglobin better. PRBC transfusion second round in am  -Constipation Stool softners to continue  All the records are reviewed and case discussed with Care Management/Social Worker. Management plans discussed with the patient, family and they are in agreement.  CODE STATUS: Full code  DVT Prophylaxis: SCDs  TOTAL TIME TAKING CARE OF THIS PATIENT: 36 minutes.   POSSIBLE D/C IN 2 to 3 DAYS, DEPENDING ON CLINICAL CONDITION.  Saundra Shelling M.D on 11/29/2018 at 9:47 AM  Between 7am to 6pm - Pager - [phone]  After 6pm go to [email or web address] - password [email or web address] [facility]  Office  [phone]  CC: Primary care physician; Rusty Aus, MD  Note: This dictation was prepared with Dragon dictation along with smaller phrase technology. Any transcriptional errors that result from this process are unintentional.

## 2018-11-29 NOTE — Telephone Encounter (Signed)
Patient husband would like a return call from Dr B to update him on her status [phone]

## 2018-11-30 LAB — CBC WITH DIFFERENTIAL/PLATELET
Abs Immature Granulocytes: 0.41 10*3/uL — ABNORMAL HIGH (ref 0.00–0.07)
Abs Immature Granulocytes: 0.74 10*3/uL — ABNORMAL HIGH (ref 0.00–0.07)
Basophils Absolute: 0 10*3/uL (ref 0.0–0.1)
Basophils Absolute: 0 10*3/uL (ref 0.0–0.1)
Basophils Relative: 0 %
Basophils Relative: 0 %
Eosinophils Absolute: 0 10*3/uL (ref 0.0–0.5)
Eosinophils Absolute: 0 10*3/uL (ref 0.0–0.5)
Eosinophils Relative: 0 %
Eosinophils Relative: 0 %
HCT: 23.3 % — ABNORMAL LOW (ref 36.0–46.0)
HCT: 23.5 % — ABNORMAL LOW (ref 36.0–46.0)
Hemoglobin: 6.9 g/dL — ABNORMAL LOW (ref 12.0–15.0)
Hemoglobin: 6.9 g/dL — ABNORMAL LOW (ref 12.0–15.0)
Immature Granulocytes: 6 %
Immature Granulocytes: 8 %
Lymphocytes Relative: 14 %
Lymphocytes Relative: 17 %
Lymphs Abs: 1.1 10*3/uL (ref 0.7–4.0)
Lymphs Abs: 1.3 10*3/uL (ref 0.7–4.0)
MCH: 22.9 pg — ABNORMAL LOW (ref 26.0–34.0)
MCH: 23.2 pg — ABNORMAL LOW (ref 26.0–34.0)
MCHC: 29.4 g/dL — ABNORMAL LOW (ref 30.0–36.0)
MCHC: 29.6 g/dL — ABNORMAL LOW (ref 30.0–36.0)
MCV: 78.1 fL — ABNORMAL LOW (ref 80.0–100.0)
MCV: 78.2 fL — ABNORMAL LOW (ref 80.0–100.0)
Monocytes Absolute: 1.7 10*3/uL — ABNORMAL HIGH (ref 0.1–1.0)
Monocytes Absolute: 2.5 10*3/uL — ABNORMAL HIGH (ref 0.1–1.0)
Monocytes Relative: 26 %
Monocytes Relative: 27 %
Neutro Abs: 3.1 10*3/uL (ref 1.7–7.7)
Neutro Abs: 4.9 10*3/uL (ref 1.7–7.7)
Neutrophils Relative %: 50 %
Neutrophils Relative %: 52 %
Platelets: 15 10*3/uL — CL (ref 150–400)
Platelets: 17 10*3/uL — CL (ref 150–400)
RBC: 2.98 MIL/uL — ABNORMAL LOW (ref 3.87–5.11)
RBC: 3.01 MIL/uL — ABNORMAL LOW (ref 3.87–5.11)
RDW: 19.6 % — ABNORMAL HIGH (ref 11.5–15.5)
RDW: 20 % — ABNORMAL HIGH (ref 11.5–15.5)
Smear Review: DECREASED
WBC: 6.4 10*3/uL (ref 4.0–10.5)
WBC: 9.4 10*3/uL (ref 4.0–10.5)
nRBC: 0.9 % — ABNORMAL HIGH (ref 0.0–0.2)

## 2018-11-30 LAB — BASIC METABOLIC PANEL
Anion gap: 10 (ref 5–15)
Anion gap: 8 (ref 5–15)
Anion gap: 8 (ref 5–15)
BUN: 27 mg/dL — ABNORMAL HIGH (ref 8–23)
BUN: 27 mg/dL — ABNORMAL HIGH (ref 8–23)
BUN: 30 mg/dL — ABNORMAL HIGH (ref 8–23)
CO2: 22 mmol/L (ref 22–32)
CO2: 24 mmol/L (ref 22–32)
CO2: 24 mmol/L (ref 22–32)
Calcium: 8.6 mg/dL — ABNORMAL LOW (ref 8.9–10.3)
Calcium: 8.8 mg/dL — ABNORMAL LOW (ref 8.9–10.3)
Calcium: 8.9 mg/dL (ref 8.9–10.3)
Chloride: 103 mmol/L (ref 98–111)
Chloride: 107 mmol/L (ref 98–111)
Chloride: 108 mmol/L (ref 98–111)
Creatinine, Ser: 0.65 mg/dL (ref 0.44–1.00)
Creatinine, Ser: 0.68 mg/dL (ref 0.44–1.00)
Creatinine, Ser: 0.69 mg/dL (ref 0.44–1.00)
GFR calc Af Amer: >60 mL/min (ref >60)
GFR calc Af Amer: >60 mL/min (ref >60)
GFR calc Af Amer: >60 mL/min (ref >60)
GFR calc non Af Amer: >60 mL/min (ref >60)
GFR calc non Af Amer: >60 mL/min (ref >60)
GFR calc non Af Amer: >60 mL/min (ref >60)
Glucose, Bld: 110 mg/dL — ABNORMAL HIGH (ref 70–99)
Glucose, Bld: 125 mg/dL — ABNORMAL HIGH (ref 70–99)
Glucose, Bld: 136 mg/dL — ABNORMAL HIGH (ref 70–99)
Potassium: 3.2 mmol/L — ABNORMAL LOW (ref 3.5–5.1)
Potassium: 3.4 mmol/L — ABNORMAL LOW (ref 3.5–5.1)
Potassium: 3.6 mmol/L (ref 3.5–5.1)
Sodium: 135 mmol/L (ref 135–145)
Sodium: 139 mmol/L (ref 135–145)
Sodium: 140 mmol/L (ref 135–145)

## 2018-11-30 LAB — MAGNESIUM
Magnesium: 1.9 mg/dL (ref 1.7–2.4)
Magnesium: 2 mg/dL (ref 1.7–2.4)
Magnesium: 2.2 mg/dL (ref 1.7–2.4)

## 2018-11-30 LAB — BPAM PLATELET PHERESIS
Blood Product Expiration Date: 202006212359
ISSUE DATE / TIME: 202006192050
Unit Type and Rh: 6200

## 2018-11-30 LAB — PHOSPHORUS
Phosphorus: 4.2 mg/dL (ref 2.5–4.6)
Phosphorus: 4.6 mg/dL (ref 2.5–4.6)
Phosphorus: 4.8 mg/dL — ABNORMAL HIGH (ref 2.5–4.6)

## 2018-11-30 LAB — URIC ACID
Uric Acid, Serum: 2.6 mg/dL (ref 2.5–7.1)
Uric Acid, Serum: 2.8 mg/dL (ref 2.5–7.1)
Uric Acid, Serum: 2.9 mg/dL (ref 2.5–7.1)

## 2018-11-30 LAB — LACTATE DEHYDROGENASE
LDH: 2246 U/L — ABNORMAL HIGH (ref 98–192)
LDH: 2356 U/L — ABNORMAL HIGH (ref 98–192)
LDH: 2551 U/L — ABNORMAL HIGH (ref 98–192)

## 2018-11-30 LAB — PREPARE PLATELET PHERESIS: Unit division: 0

## 2018-11-30 LAB — PREPARE RBC (CROSSMATCH)

## 2018-11-30 MED ORDER — SODIUM CHLORIDE 0.9% FLUSH: 3.0000 mL | INTRAVENOUS | Status: DC | PRN

## 2018-11-30 MED ORDER — HEPARIN SOD (PORK) LOCK FLUSH 100 UNIT/ML IV SOLN: 250.0000 [IU] | INTRAVENOUS | Status: DC | PRN

## 2018-11-30 MED ORDER — HEPARIN SOD (PORK) LOCK FLUSH 100 UNIT/ML IV SOLN: 500.0000 [IU] | Freq: Every day | INTRAVENOUS | Status: DC | PRN

## 2018-11-30 MED ORDER — VENETOCLAX 100 MG PO TABS
400.0000 mg | ORAL_TABLET | Freq: Every day | ORAL | Status: AC
  Administered 2018-11-30 – 2018-12-01 (×2): 400 mg via ORAL
  Filled 2018-11-30 (×2): qty 4

## 2018-11-30 MED ORDER — SODIUM CHLORIDE 0.9% IV SOLUTION
250.0000 mL | Freq: Once | INTRAVENOUS | Status: AC
  Administered 2018-11-30: 18:00:00 250 mL via INTRAVENOUS

## 2018-11-30 MED ORDER — SODIUM CHLORIDE 0.9% FLUSH: 10.0000 mL | INTRAVENOUS | Status: DC | PRN

## 2018-11-30 NOTE — Progress Notes (Signed)
1st PRBC's transfusing. Platelets on order via lab and will transfuse after blood when available.

## 2018-11-30 NOTE — Progress Notes (Signed)
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 She has poor venous access and getting a line for her has been difficult  ECOG PS- 1 Pain scale- 0   Review of systems- Review of Systems  Constitutional: Positive for malaise/fatigue. Negative for chills, fever and weight loss.  HENT: Negative for congestion, ear discharge and nosebleeds.   Eyes: Negative for blurred vision.  Respiratory: Negative for cough, hemoptysis, sputum production, shortness of breath and wheezing.   Cardiovascular: Negative for chest pain, palpitations, orthopnea and claudication.  Gastrointestinal: Negative for abdominal pain, blood in stool, constipation, diarrhea, heartburn, melena, nausea and vomiting.  Genitourinary: Negative for dysuria, flank pain, frequency, hematuria and urgency.  Musculoskeletal: Negative for back pain, joint pain and myalgias.  Skin: Negative for rash.  Neurological: Negative for dizziness, tingling, focal weakness, seizures, weakness and headaches.  Endo/Heme/Allergies: Bruises/bleeds easily.  Psychiatric/Behavioral: Negative for depression and suicidal ideas. The patient does not have insomnia.       Allergies  Allergen Reactions  . Terbinafine Rash and Swelling     Past Medical History:  Diagnosis Date  . Anemia   . Arthritis   . Breast cancer ([place]) 2003   RT LUMPECTOMY  . Edema leg   . History of breast cancer 2003   post lumpectomy  . History of kidney stones   . Hyperlipemia, mixed   . Hypertension, essential   . Leukemia ([place])   . Leukocytosis   . MDS (myelodysplastic syndrome) ([place])   .  Personal history of radiation therapy 2003   BREAST CA  . Renal stones   . Vitamin D deficiency      Past Surgical History:  Procedure Laterality Date  . BREAST BIOPSY Right 2003   Postive for cancer  . BREAST LUMPECTOMY Right 2003   BREAST CA  . KIDNEY STONE SURGERY Right     Social History   Socioeconomic History  . Marital status: Married    Spouse name: Not on file  . Number of children: Not on file  . Years of education: Not on file  . Highest education level: Not on file  Occupational History  . Not on file  Social Needs  . Financial resource strain: Not on file  . Food insecurity    Worry: Not on file    Inability: Not on file  . Transportation needs    Medical: Not on file    Non-medical: Not on file  Tobacco Use  . Smoking status: Never Smoker  . Smokeless tobacco: Never Used  Substance and Sexual Activity  . Alcohol use: Yes    Alcohol/week: 3.0 standard drinks    Types: 3 Glasses of wine per week  . Drug use: No  . Sexual activity: Not on file  Lifestyle  . Physical activity    Days per week: Not on file    Minutes per session: Not on file  . Stress: Not on file  Relationships  . Social Herbalist on phone: Not on file    Gets together: Not on file    Attends religious service: Not on file    Active member of  club or organization: Not on file    Attends meetings of clubs or organizations: Not on file    Relationship status: Not on file  . Intimate partner violence    Fear of current or ex partner: Not on file    Emotionally abused: Not on file    Physically abused: Not on file    Forced sexual activity: Not on file  Other Topics Concern  . Not on file  Social History Narrative    No smoking/alcohol; lives in [place] with family.  She lives in assisted living with her husband.    Family History  Problem Relation Age of Onset  . Stroke Mother   . Hypertension Father   . Stroke Father   . Breast cancer Neg Hx       Current Facility-Administered Medications:  .  0.9 %  sodium chloride infusion, , Intravenous, Continuous, Pyreddy, Pavan, MD, Last Rate: 75 mL/hr at 11/30/18 0800 .  acetaminophen (TYLENOL) tablet 650 mg, 650 mg, Oral, Q6H PRN **OR** acetaminophen (TYLENOL) suppository 650 mg, 650 mg, Rectal, Q6H PRN, Pyreddy, Pavan, MD .  allopurinol (ZYLOPRIM) tablet 300 mg, 300 mg, Oral, BID, Pyreddy, Pavan, MD, 300 mg at 11/30/18 0818 .  atenolol (TENORMIN) tablet 50 mg, 50 mg, Oral, BID, Pyreddy, Pavan, MD, 50 mg at 11/30/18 0819 .  calcium-vitamin D (OSCAL WITH D) 500-200 MG-UNIT per tablet 1 tablet, 1 tablet, Oral, Daily, Pyreddy, Pavan, MD, 1 tablet at 11/30/18 0820 .  cholecalciferol (VITAMIN D3) tablet 2,000 Units, 2,000 Units, Oral, Daily, Pyreddy, Pavan, MD, 2,000 Units at 11/30/18 0820 .  heparin lock flush 100 unit/mL, 500 Units, Intracatheter, Daily PRN, Charlaine Dalton R, MD .  heparin lock flush 100 unit/mL, 250 Units, Intracatheter, PRN, Charlaine Dalton R, MD .  heparin lock flush 100 unit/mL, 500 Units, Intracatheter, Daily PRN, Charlaine Dalton R, MD .  heparin lock flush 100 unit/mL, 250 Units, Intracatheter, PRN, Charlaine Dalton R, MD .  iron polysaccharides (NIFEREX) capsule 150 mg, 150 mg, Oral, BID, Lance Coon, MD, 150 mg at 11/30/18 0818 .  losartan (COZAAR) tablet 50 mg, 50 mg, Oral, Daily, 50 mg at 11/30/18 0820 **AND** [DISCONTINUED] hydrochlorothiazide (MICROZIDE) capsule 12.5 mg, 12.5 mg, Oral, Daily, Pyreddy, Pavan, MD, 12.5 mg at 11/27/18 0803 .  magic mouthwash w/lidocaine, 5 mL, Oral, QID PRN, Pyreddy, Pavan, MD .  omega-3 acid ethyl esters (LOVAZA) capsule 1 g, 1 g, Oral, Daily, Pyreddy, Pavan, MD, 1 g at 11/30/18 0819 .  ondansetron (ZOFRAN) tablet 4 mg, 4 mg, Oral, Q6H PRN **OR** ondansetron (ZOFRAN) injection 4 mg, 4 mg, Intravenous, Q6H PRN, Pyreddy, Pavan, MD .  polyethylene glycol (MIRALAX / GLYCOLAX) packet 17 g, 17 g, Oral, Daily, Pyreddy, Pavan, MD, 17  g at 11/30/18 8841 .  senna-docusate (Senokot-S) tablet 2 tablet, 2 tablet, Oral, BID, Pyreddy, Pavan, MD, 2 tablet at 11/30/18 0821 .  sodium chloride flush (NS) 0.9 % injection 10 mL, 10 mL, Intracatheter, PRN, Charlaine Dalton R, MD .  sodium chloride flush (NS) 0.9 % injection 10 mL, 10 mL, Intracatheter, PRN, Charlaine Dalton R, MD .  sodium chloride flush (NS) 0.9 % injection 3 mL, 3 mL, Intracatheter, PRN, Charlaine Dalton R, MD .  sodium chloride flush (NS) 0.9 % injection 3 mL, 3 mL, Intracatheter, PRN, Charlaine Dalton R, MD .  sodium phosphate (FLEET) 7-19 GM/118ML enema 1 enema, 1 enema, Rectal, Daily PRN, Saundra Shelling, MD, 1 enema at 11/29/18 1633 .  vitamin C (ASCORBIC ACID) tablet 500 mg,  500 mg, Oral, BID, Pyreddy, Pavan, MD, 500 mg at 11/30/18 0821  Physical exam:  Vitals:   11/29/18 2114 11/29/18 2312 11/30/18 0449 11/30/18 0816  BP: (!) 155/78 (!) 157/83 117/62 132/71  Pulse: 78 82 72 70  Resp: 20  15 20   Temp: 98.2 F (36.8 C) 98.1 F (36.7 C) 98.5 F (36.9 C) 98.1 F (36.7 C)  TempSrc: Oral   Oral  SpO2: 97% 97% 91% 96%  Weight:      Height:       Physical Exam Constitutional:      Comments: Thin elderly female in no acute distress  HENT:     Head: Normocephalic and atraumatic.  Eyes:     Pupils: Pupils are equal, round, and reactive to light.  Neck:     Musculoskeletal: Normal range of motion.  Cardiovascular:     Rate and Rhythm: Normal rate and regular rhythm.     Heart sounds: Normal heart sounds.  Pulmonary:     Effort: Pulmonary effort is normal.     Breath sounds: Normal breath sounds.  Abdominal:     General: Bowel sounds are normal.     Palpations: Abdomen is soft.  Skin:    General: Skin is warm and dry.     Comments: Scattered bruising over b/l arms/ forearms due to puncture marks from IV access  Neurological:     Mental Status: She is alert and oriented to person, place, and time.      CMP Latest Ref Rng & Units  11/30/2018  Glucose 70 - 99 mg/dL 125(H)  BUN 8 - 23 mg/dL 30(H)  Creatinine 0.44 - 1.00 mg/dL 0.68  Sodium 135 - 145 mmol/L 139  Potassium 3.5 - 5.1 mmol/L 3.2(L)  Chloride 98 - 111 mmol/L 107  CO2 22 - 32 mmol/L 24  Calcium 8.9 - 10.3 mg/dL 8.9  Total Protein 6.5 - 8.1 g/dL -  Total Bilirubin 0.3 - 1.2 mg/dL -  Alkaline Phos 38 - 126 U/L -  AST 15 - 41 U/L -  ALT 0 - 44 U/L -   CBC Latest Ref Rng & Units 11/30/2018  WBC 4.0 - 10.5 K/uL 9.4  Hemoglobin 12.0 - 15.0 g/dL 6.9(L)  Hematocrit 36.0 - 46.0 % 23.5(L)  Platelets 150 - 400 K/uL 17(LL)    @IMAGES @  No results found.   Assessment and plan- Patient is a 85 y.o. female with history of AML currently not in remission on venetoclax and Vidaza admitted for venetoclax ramp up and monitoring for TLS  1. Patient has completed 5 days of vidaza. She is on max dose of venetoclax at 400 mg per day since yesterday which I will continue. I have called pharmacy and asked to continue inpatient today and tomorrow.  2. TLS: LDH is trending down. Uric acid is normal. Mild hyperphosphatemia has nnot resolved. Potassium is low at 3.4. ok to monitor that without replacement given concern for TLS. LDH is trending down.   3. Pancytopenia- secondary to chemotherapy. Cbc from midnight last night showed hb of 6.9. I have ordered another cbc at this time and based on her counts, I will put in orders for blood and / or platelets  4. Poor venous access- Spoke to DR. Brahmanday. He would like the patient to have a PICC line prior to discharge. I have spoken to Dr. Anselm Jungling and he will arrange for that prior to discharge.  I will update patients husband as well.   Given the need  for frequent transfusion- please keep the patient in patient over the weekend so she could have a close follow up post discharge   Visit Diagnosis 1. MDS/MPN (myelodysplastic/myeloproliferative neoplasms) ([place])   2. Goals of care, counseling/discussion   3. AML (acute myeloid  leukemia) with failed remission ([place])      Dr. Randa Evens, MD, MPH [facility] [phone] 11/30/2018 1:41 PM

## 2018-11-30 NOTE — Progress Notes (Signed)
[place] [facility] [place] NAME: Kathryn Hahn    MR#:  [id number]  DATE OF BIRTH:  1933-10-26  SUBJECTIVE:  Patient seen and evaluated today No complaints of chest pain No shortness of breath No fever No nausea and vomiting Appetite ok Has constpation Has some ecchymosis over the upper extremities secondary to needle sticks  REVIEW OF SYSTEMS:    ROS  CONSTITUTIONAL: No documented fever. No fatigue, weakness. No weight gain, no weight loss.  EYES: No blurry or double vision.  ENT: No tinnitus. No postnasal drip. No redness of the oropharynx.  RESPIRATORY: No cough, no wheeze, no hemoptysis. No dyspnea.  CARDIOVASCULAR: No chest pain. No orthopnea. No palpitations. No syncope.  GASTROINTESTINAL: No nausea, no vomiting or diarrhea. No abdominal pain. No melena or hematochezia.  GENITOURINARY: No dysuria or hematuria.  ENDOCRINE: No polyuria or nocturia. No heat or cold intolerance.  HEMATOLOGY: No anemia. No bruising. No bleeding.  INTEGUMENTARY: No rashes. No lesions.  MUSCULOSKELETAL: No arthritis. No swelling. No gout.  Ecchymosis noted over upper extremities NEUROLOGIC: No numbness, tingling, or ataxia. No seizure-type activity.  PSYCHIATRIC: No anxiety. No insomnia. No ADD.   DRUG ALLERGIES:   Allergies  Allergen Reactions  . Terbinafine Rash and Swelling    VITALS:  Blood pressure 132/71, pulse 70, temperature 98.1 F (36.7 C), temperature source Oral, resp. rate 20, height 4\' 9"  (1.448 m), weight 42.8 kg, SpO2 96 %.  PHYSICAL EXAMINATION:   Physical Exam  GENERAL:  85 y.o.-year-old patient lying in the bed with no acute distress.  EYES: Pupils equal, round, reactive to light and accommodation. No scleral icterus. Extraocular muscles intact.  HEENT: Head atraumatic, normocephalic. Oropharynx and nasopharynx clear.  NECK:  Supple, no jugular venous distention. No thyroid enlargement, no tenderness.  LUNGS: Normal breath  sounds bilaterally, no wheezing, rales, rhonchi. No use of accessory muscles of respiration.  CARDIOVASCULAR: S1, S2 normal. No murmurs, rubs, or gallops.  ABDOMEN: Soft, nontender, nondistended. Bowel sounds present. No organomegaly or mass.  EXTREMITIES: No cyanosis, clubbing or edema b/l.    Ecchymosis NEUROLOGIC: Cranial nerves II through XII are intact. No focal Motor or sensory deficits b/l.   PSYCHIATRIC: The patient is alert and oriented x 3.  SKIN: No obvious rash, lesion, or ulcer.  Multiple superficial ecchymosis due to needlesticks on both forearms  LABORATORY PANEL:   CBC Recent Labs  Lab 11/30/18 0009  WBC 9.4  HGB 6.9*  HCT 23.5*  PLT 17*   ------------------------------------------------------------------------------------------------------------------ Chemistries  Recent Labs  Lab 11/25/18 1219  11/30/18 0808  NA 138   < > 140  K 3.8   < > 3.4*  CL 101   < > 108  CO2 30   < > 24  GLUCOSE 110*   < > 110*  BUN 23   < > 27*  CREATININE 0.88   < > 0.65  CALCIUM 10.5*   < > 8.8*  MG  --    < > 2.2  AST 39  --   --   ALT 13  --   --   ALKPHOS 66  --   --   BILITOT 0.9  --   --    < > = values in this interval not displayed.   ------------------------------------------------------------------------------------------------------------------  Cardiac Enzymes No results for input(s): TROPONINI in the last 168 hours. ------------------------------------------------------------------------------------------------------------------  RADIOLOGY:  No results found.   ASSESSMENT AND PLAN:  85 year old female patient with history of  myeloproliferative neoplasm, progressive leukocytosis, breast cancer, hypertension, hyperlipidemia currently under hospitalist service  -Myeloproliferative neoplasm Oncology follow-up appreciated Chemotherapy to continue Vidaza injection [place] given Venetoclax tabs Watch for nausea vomiting and cell counts Monitor LDH, uric acid  levels  -Hypokalemia Replaced potassium orally  -Elevated uric acid levels Watch for tumor lysis syndrome IV fluids Allopurinol on board Nephrology notified  - Thrombocytopenia Monitor platelet count Platelet transfusion done.  -Dehydration IV fluids  -DVT prophylaxis sequential compression device to lower extremities  -Anemia Chronic S/p prbc transfusion Iron supplements Hemoglobin was again less than 7 last night, oncologist ordered repeat check and possible transfusion if still less than 7.  -Constipation Stool softners to continue  Patient would need PICC line placement before discharge on Monday for requirements of blood transfusion from cancer center as outpatient.  All the records are reviewed and case discussed with Care Management/Social Worker. Management plans discussed with the patient, family and they are in agreement.  CODE STATUS: Full code  DVT Prophylaxis: SCDs  TOTAL TIME TAKING CARE OF THIS PATIENT: 36 minutes.   POSSIBLE D/C IN 2 to 3 DAYS, DEPENDING ON CLINICAL CONDITION.  Kathryn Hahn M.D on 11/30/2018 at 3:46 PM  Between 7am to 6pm - Pager - [phone]  After 6pm go to [email or web address] - password [email or web address] [facility]  Office  [phone]  CC: Primary care physician; Kathryn Aus, MD  Note: This dictation was prepared with Dragon dictation along with smaller phrase technology. Any transcriptional errors that result from this process are unintentional.

## 2018-11-30 NOTE — Progress Notes (Signed)
No signs bleeding except hematoma at lab venipuncture site. Lab values reveiwed. Up in room and tolerated well. HGB last check was 6.9 with MD ordering repeat- may transfuse if HGB drops more. Platelets 17 today.  Denies co's/very pleasant lady. Discuss with MD limited vein access; will consider port placement.

## 2018-12-01 LAB — CBC WITH DIFFERENTIAL/PLATELET
Abs Immature Granulocytes: 0.53 10*3/uL — ABNORMAL HIGH (ref 0.00–0.07)
Abs Immature Granulocytes: 0.5 10*3/uL — ABNORMAL HIGH (ref 0.00–0.07)
Basophils Absolute: 0 10*3/uL (ref 0.0–0.1)
Basophils Absolute: 0 10*3/uL (ref 0.0–0.1)
Basophils Relative: 0 %
Basophils Relative: 0 %
Eosinophils Absolute: 0 10*3/uL (ref 0.0–0.5)
Eosinophils Absolute: 0 10*3/uL (ref 0.0–0.5)
Eosinophils Relative: 0 %
Eosinophils Relative: 0 %
HCT: 25.5 % — ABNORMAL LOW (ref 36.0–46.0)
HCT: 24.4 % — ABNORMAL LOW (ref 36.0–46.0)
Hemoglobin: 8 g/dL — ABNORMAL LOW (ref 12.0–15.0)
Hemoglobin: 7.6 g/dL — ABNORMAL LOW (ref 12.0–15.0)
Immature Granulocytes: 8 %
Immature Granulocytes: 9 %
Lymphocytes Relative: 17 %
Lymphocytes Relative: 17 %
Lymphs Abs: 1.1 10*3/uL (ref 0.7–4.0)
Lymphs Abs: 1 10*3/uL (ref 0.7–4.0)
MCH: 24.4 pg — ABNORMAL LOW (ref 26.0–34.0)
MCH: 24.4 pg — ABNORMAL LOW (ref 26.0–34.0)
MCHC: 31.4 g/dL (ref 30.0–36.0)
MCHC: 31.1 g/dL (ref 30.0–36.0)
MCV: 77.7 fL — ABNORMAL LOW (ref 80.0–100.0)
MCV: 78.2 fL — ABNORMAL LOW (ref 80.0–100.0)
Monocytes Absolute: 1.6 10*3/uL — ABNORMAL HIGH (ref 0.1–1.0)
Monocytes Absolute: 1.2 10*3/uL — ABNORMAL HIGH (ref 0.1–1.0)
Monocytes Relative: 24 %
Monocytes Relative: 20 %
Neutro Abs: 3.3 10*3/uL (ref 1.7–7.7)
Neutro Abs: 3.1 10*3/uL (ref 1.7–7.7)
Neutrophils Relative %: 51 %
Neutrophils Relative %: 54 %
Platelets: 11 10*3/uL — CL (ref 150–400)
Platelets: 19 10*3/uL — CL (ref 150–400)
RBC: 3.28 MIL/uL — ABNORMAL LOW (ref 3.87–5.11)
RBC: 3.12 MIL/uL — ABNORMAL LOW (ref 3.87–5.11)
RDW: 19.4 % — ABNORMAL HIGH (ref 11.5–15.5)
RDW: 19.4 % — ABNORMAL HIGH (ref 11.5–15.5)
WBC: 6.6 10*3/uL (ref 4.0–10.5)
WBC: 5.8 10*3/uL (ref 4.0–10.5)
nRBC: 1.7 % — ABNORMAL HIGH (ref 0.0–0.2)
nRBC: 2.1 % — ABNORMAL HIGH (ref 0.0–0.2)

## 2018-12-01 LAB — BASIC METABOLIC PANEL
Anion gap: 7 (ref 5–15)
Anion gap: 8 (ref 5–15)
BUN: 25 mg/dL — ABNORMAL HIGH (ref 8–23)
BUN: 30 mg/dL — ABNORMAL HIGH (ref 8–23)
CO2: 22 mmol/L (ref 22–32)
CO2: 24 mmol/L (ref 22–32)
Calcium: 8.7 mg/dL — ABNORMAL LOW (ref 8.9–10.3)
Calcium: 8.9 mg/dL (ref 8.9–10.3)
Chloride: 107 mmol/L (ref 98–111)
Chloride: 107 mmol/L (ref 98–111)
Creatinine, Ser: 0.52 mg/dL (ref 0.44–1.00)
Creatinine, Ser: 0.54 mg/dL (ref 0.44–1.00)
GFR calc Af Amer: >60 mL/min (ref >60)
GFR calc Af Amer: >60 mL/min (ref >60)
GFR calc non Af Amer: >60 mL/min (ref >60)
GFR calc non Af Amer: >60 mL/min (ref >60)
Glucose, Bld: 106 mg/dL — ABNORMAL HIGH (ref 70–99)
Glucose, Bld: 109 mg/dL — ABNORMAL HIGH (ref 70–99)
Potassium: 3.3 mmol/L — ABNORMAL LOW (ref 3.5–5.1)
Potassium: 3.5 mmol/L (ref 3.5–5.1)
Sodium: 137 mmol/L (ref 135–145)
Sodium: 138 mmol/L (ref 135–145)

## 2018-12-01 LAB — PHOSPHORUS
Phosphorus: 4.5 mg/dL (ref 2.5–4.6)
Phosphorus: 4.6 mg/dL (ref 2.5–4.6)

## 2018-12-01 LAB — LACTATE DEHYDROGENASE
LDH: 1888 U/L — ABNORMAL HIGH (ref 98–192)
LDH: 2110 U/L — ABNORMAL HIGH (ref 98–192)

## 2018-12-01 LAB — URIC ACID
Uric Acid, Serum: 2.8 mg/dL (ref 2.5–7.1)
Uric Acid, Serum: 3 mg/dL (ref 2.5–7.1)

## 2018-12-01 LAB — MAGNESIUM
Magnesium: 2 mg/dL (ref 1.7–2.4)
Magnesium: 2 mg/dL (ref 1.7–2.4)

## 2018-12-01 MED ORDER — VENETOCLAX 100 MG PO TABS: 400.0000 mg | ORAL_TABLET | Freq: Every day | ORAL | Status: DC

## 2018-12-01 NOTE — Progress Notes (Signed)
Hematology/Oncology Consult note [facility]  Telephone:([phone] [phone]  Patient Care Team: Rusty Aus, MD as PCP - General (Internal Medicine)   Name of the patient: Kathryn Hahn  [id number]  06-Jan-1934   Date of visit: 12/01/2018  Interval history- feels well overall. Denies any specific complaints today   Review of systems- Review of Systems  Constitutional: Positive for malaise/fatigue. Negative for chills, fever and weight loss.  HENT: Negative for congestion, ear discharge and nosebleeds.   Eyes: Negative for blurred vision.  Respiratory: Negative for cough, hemoptysis, sputum production, shortness of breath and wheezing.   Cardiovascular: Negative for chest pain, palpitations, orthopnea and claudication.  Gastrointestinal: Negative for abdominal pain, blood in stool, constipation, diarrhea, heartburn, melena, nausea and vomiting.  Genitourinary: Negative for dysuria, flank pain, frequency, hematuria and urgency.  Musculoskeletal: Negative for back pain, joint pain and myalgias.  Skin: Negative for rash.  Neurological: Negative for dizziness, tingling, focal weakness, seizures, weakness and headaches.  Endo/Heme/Allergies: Does not bruise/bleed easily.  Psychiatric/Behavioral: Negative for depression and suicidal ideas. The patient does not have insomnia.        Allergies  Allergen Reactions   Terbinafine Rash and Swelling     Past Medical History:  Diagnosis Date   Anemia    Arthritis    Breast cancer ([place]) 2003   RT LUMPECTOMY   Edema leg    History of breast cancer 2003   post lumpectomy   History of kidney stones    Hyperlipemia, mixed    Hypertension, essential    Leukemia ([place])    Leukocytosis    MDS (myelodysplastic syndrome) ([place])    Personal history of radiation therapy 2003   BREAST CA   Renal stones    Vitamin D deficiency      Past Surgical History:  Procedure Laterality  Date   BREAST BIOPSY Right 2003   Postive for cancer   BREAST LUMPECTOMY Right 2003   BREAST CA   KIDNEY STONE SURGERY Right     Social History   Socioeconomic History   Marital status: Married    Spouse name: Not on file   Number of children: Not on file   Years of education: Not on file   Highest education level: Not on file  Occupational History   Not on file  Social Needs   Financial resource strain: Not on file   Food insecurity    Worry: Not on file    Inability: Not on file   Transportation needs    Medical: Not on file    Non-medical: Not on file  Tobacco Use   Smoking status: Never Smoker   Smokeless tobacco: Never Used  Substance and Sexual Activity   Alcohol use: Yes    Alcohol/week: 3.0 standard drinks    Types: 3 Glasses of wine per week   Drug use: No   Sexual activity: Not on file  Lifestyle   Physical activity    Days per week: Not on file    Minutes per session: Not on file   Stress: Not on file  Relationships   Social connections    Talks on phone: Not on file    Gets together: Not on file    Attends religious service: Not on file    Active member of club or organization: Not on file    Attends meetings of clubs or organizations: Not on file    Relationship status: Not on file  Intimate partner violence    Fear of current or ex partner: Not on file    Emotionally abused: Not on file    Physically abused: Not on file    Forced sexual activity: Not on file  Other Topics Concern   Not on file  Social History Narrative    No smoking/alcohol; lives in [place] with family.  She lives in assisted living with her husband.    Family History  Problem Relation Age of Onset   Stroke Mother    Hypertension Father    Stroke Father    Breast cancer Neg Hx      Current Facility-Administered Medications:    0.9 %  sodium chloride infusion, , Intravenous, Continuous, Pyreddy, Pavan, MD, Last Rate: 75 mL/hr at 12/01/18  0541   acetaminophen (TYLENOL) tablet 650 mg, 650 mg, Oral, Q6H PRN **OR** acetaminophen (TYLENOL) suppository 650 mg, 650 mg, Rectal, Q6H PRN, Pyreddy, Pavan, MD   allopurinol (ZYLOPRIM) tablet 300 mg, 300 mg, Oral, BID, Pyreddy, Pavan, MD, 300 mg at 12/01/18 1028   atenolol (TENORMIN) tablet 50 mg, 50 mg, Oral, BID, Pyreddy, Pavan, MD, 50 mg at 12/01/18 1029   calcium-vitamin D (OSCAL WITH D) 500-200 MG-UNIT per tablet 1 tablet, 1 tablet, Oral, Daily, Pyreddy, Pavan, MD, 1 tablet at 12/01/18 1028   cholecalciferol (VITAMIN D3) tablet 2,000 Units, 2,000 Units, Oral, Daily, Pyreddy, Pavan, MD, 2,000 Units at 12/01/18 1029   heparin lock flush 100 unit/mL, 500 Units, Intracatheter, Daily PRN, Charlaine Dalton R, MD   heparin lock flush 100 unit/mL, 250 Units, Intracatheter, PRN, Charlaine Dalton R, MD   heparin lock flush 100 unit/mL, 500 Units, Intracatheter, Daily PRN, Charlaine Dalton R, MD   heparin lock flush 100 unit/mL, 250 Units, Intracatheter, PRN, Charlaine Dalton R, MD   heparin lock flush 100 unit/mL, 500 Units, Intracatheter, Daily PRN, Sindy Guadeloupe, MD   heparin lock flush 100 unit/mL, 250 Units, Intracatheter, PRN, Sindy Guadeloupe, MD   iron polysaccharides (NIFEREX) capsule 150 mg, 150 mg, Oral, BID, Lance Coon, MD, 150 mg at 12/01/18 1028   losartan (COZAAR) tablet 50 mg, 50 mg, Oral, Daily, 50 mg at 12/01/18 1029 **AND** [DISCONTINUED] hydrochlorothiazide (MICROZIDE) capsule 12.5 mg, 12.5 mg, Oral, Daily, Pyreddy, Pavan, MD, 12.5 mg at 11/27/18 0803   magic mouthwash w/lidocaine, 5 mL, Oral, QID PRN, Pyreddy, Reatha Harps, MD   omega-3 acid ethyl esters (LOVAZA) capsule 1 g, 1 g, Oral, Daily, Pyreddy, Pavan, MD, 1 g at 12/01/18 1029   ondansetron (ZOFRAN) tablet 4 mg, 4 mg, Oral, Q6H PRN **OR** ondansetron (ZOFRAN) injection 4 mg, 4 mg, Intravenous, Q6H PRN, Pyreddy, Pavan, MD   polyethylene glycol (MIRALAX / GLYCOLAX) packet 17 g, 17 g, Oral, Daily,  Pyreddy, Pavan, MD, 17 g at 11/30/18 4034   senna-docusate (Senokot-S) tablet 2 tablet, 2 tablet, Oral, BID, Pyreddy, Pavan, MD, 2 tablet at 12/01/18 1028   sodium chloride flush (NS) 0.9 % injection 10 mL, 10 mL, Intracatheter, PRN, Charlaine Dalton R, MD   sodium chloride flush (NS) 0.9 % injection 10 mL, 10 mL, Intracatheter, PRN, Charlaine Dalton R, MD   sodium chloride flush (NS) 0.9 % injection 10 mL, 10 mL, Intracatheter, PRN, Sindy Guadeloupe, MD   sodium chloride flush (NS) 0.9 % injection 3 mL, 3 mL, Intracatheter, PRN, Charlaine Dalton R, MD   sodium chloride flush (NS) 0.9 % injection 3 mL, 3 mL, Intracatheter, PRN, Charlaine Dalton R, MD   sodium chloride flush (NS) 0.9 % injection 3  mL, 3 mL, Intracatheter, PRN, Sindy Guadeloupe, MD   sodium phosphate (FLEET) 7-19 GM/118ML enema 1 enema, 1 enema, Rectal, Daily PRN, Pyreddy, Pavan, MD, 1 enema at 11/29/18 1633   venetoclax TABS 400 mg, 400 mg, Oral, Q1500, Sindy Guadeloupe, MD, 400 mg at 11/30/18 1535   vitamin C (ASCORBIC ACID) tablet 500 mg, 500 mg, Oral, BID, Pyreddy, Pavan, MD, 500 mg at 12/01/18 1029  Physical exam:  Vitals:   12/01/18 0144 12/01/18 0312 12/01/18 0516 12/01/18 1027  BP: 126/67 140/75 (!) 144/72 (!) 145/78  Pulse: 78 80 77 74  Resp: (!) 24 20 16 16   Temp: 98.4 F (36.9 C) 98.9 F (37.2 C) 98.4 F (36.9 C) 97.7 F (36.5 C)  TempSrc: Oral Oral Oral Oral  SpO2: 91% 91% 91% 100%  Weight:      Height:       Physical Exam Constitutional:      General: She is not in acute distress. HENT:     Head: Normocephalic and atraumatic.  Eyes:     Pupils: Pupils are equal, round, and reactive to light.  Neck:     Musculoskeletal: Normal range of motion.  Cardiovascular:     Rate and Rhythm: Normal rate and regular rhythm.     Heart sounds: Normal heart sounds.  Pulmonary:     Effort: Pulmonary effort is normal.     Breath sounds: Normal breath sounds.  Abdominal:     General: Bowel sounds  are normal.     Palpations: Abdomen is soft.  Skin:    General: Skin is warm and dry.     Comments: Scattered bruising from venipuncture  Neurological:     Mental Status: She is alert and oriented to person, place, and time.      CMP Latest Ref Rng & Units 12/01/2018  Glucose 70 - 99 mg/dL 106(H)  BUN 8 - 23 mg/dL 25(H)  Creatinine 0.44 - 1.00 mg/dL 0.52  Sodium 135 - 145 mmol/L 138  Potassium 3.5 - 5.1 mmol/L 3.3(L)  Chloride 98 - 111 mmol/L 107  CO2 22 - 32 mmol/L 24  Calcium 8.9 - 10.3 mg/dL 8.7(L)  Total Protein 6.5 - 8.1 g/dL -  Total Bilirubin 0.3 - 1.2 mg/dL -  Alkaline Phos 38 - 126 U/L -  AST 15 - 41 U/L -  ALT 0 - 44 U/L -   CBC Latest Ref Rng & Units 12/01/2018  WBC 4.0 - 10.5 K/uL 5.8  Hemoglobin 12.0 - 15.0 g/dL 7.6(L)  Hematocrit 36.0 - 46.0 % 24.4(L)  Platelets 150 - 400 K/uL 19(LL)     Assessment and plan- Patient is a 85 y.o. female with AML admitted for cycle 1 of vidaza/venetoclax  1. Patient tolerated venetoclax ramp up well. No evidence of TLS based on labs. LDH trending down. She can be discharged tomorrow. She will receive her outpatient venetoclax through our inpatient pharmacy before discharge  2. Pancytopenia: secondary to AML/ chemotherapy. She got prbc and platelets yesterday. No transfusion today. Due to poor venous access she may benefit from picc line  F/u with Dr. Rogue Bussing post discharge this week for possible transfusion   Visit Diagnosis 1. MDS/MPN (myelodysplastic/myeloproliferative neoplasms) ([place])   2. Goals of care, counseling/discussion   3. AML (acute myeloid leukemia) with failed remission ([place])   4. Antineoplastic chemotherapy induced pancytopenia ([facility])      Dr. Randa Evens, MD, MPH [facility] at [facility] [phone] 12/01/2018 3:49 PM

## 2018-12-01 NOTE — Progress Notes (Signed)
[place] [facility] [place] NAME: Kathryn Hahn    MR#:  [id number]  DATE OF BIRTH:  [birth year]  SUBJECTIVE:  Patient seen and evaluated today No complaints of chest pain No shortness of breath No fever No nausea and vomiting Appetite ok Has constpation Has some ecchymosis over the upper extremities secondary to needle sticks Received PRBC and Plt last night.  REVIEW OF SYSTEMS:    ROS  CONSTITUTIONAL: No documented fever. No fatigue, weakness. No weight gain, no weight loss.  EYES: No blurry or double vision.  ENT: No tinnitus. No postnasal drip. No redness of the oropharynx.  RESPIRATORY: No cough, no wheeze, no hemoptysis. No dyspnea.  CARDIOVASCULAR: No chest pain. No orthopnea. No palpitations. No syncope.  GASTROINTESTINAL: No nausea, no vomiting or diarrhea. No abdominal pain. No melena or hematochezia.  GENITOURINARY: No dysuria or hematuria.  ENDOCRINE: No polyuria or nocturia. No heat or cold intolerance.  HEMATOLOGY: No anemia. No bruising. No bleeding.  INTEGUMENTARY: No rashes. No lesions.  MUSCULOSKELETAL: No arthritis. No swelling. No gout.  Ecchymosis noted over upper extremities NEUROLOGIC: No numbness, tingling, or ataxia. No seizure-type activity.  PSYCHIATRIC: No anxiety. No insomnia. No ADD.   DRUG ALLERGIES:   Allergies  Allergen Reactions  . Terbinafine Rash and Swelling    VITALS:  Blood pressure (!) 145/78, pulse 74, temperature 97.7 F (36.5 C), temperature source Oral, resp. rate 16, height 4\' 9"  (1.448 m), weight 42.8 kg, SpO2 100 %.  PHYSICAL EXAMINATION:   Physical Exam  GENERAL:  85 y.o.-year-old patient lying in the bed with no acute distress.  EYES: Pupils equal, round, reactive to light and accommodation. No scleral icterus. Extraocular muscles intact.  HEENT: Head atraumatic, normocephalic. Oropharynx and nasopharynx clear.  NECK:  Supple, no jugular venous distention. No thyroid  enlargement, no tenderness.  LUNGS: Normal breath sounds bilaterally, no wheezing, rales, rhonchi. No use of accessory muscles of respiration.  CARDIOVASCULAR: S1, S2 normal. No murmurs, rubs, or gallops.  ABDOMEN: Soft, nontender, nondistended. Bowel sounds present. No organomegaly or mass.  EXTREMITIES: No cyanosis, clubbing or edema b/l.    Ecchymosis NEUROLOGIC: Cranial nerves II through XII are intact. No focal Motor or sensory deficits b/l.   PSYCHIATRIC: The patient is alert and oriented x 3.  SKIN: No obvious rash, lesion, or ulcer.  Multiple superficial ecchymosis due to needlesticks on both forearms  LABORATORY PANEL:   CBC Recent Labs  Lab 12/01/18 0820  WBC 5.8  HGB 7.6*  HCT 24.4*  PLT 19*   ------------------------------------------------------------------------------------------------------------------ Chemistries  Recent Labs  Lab 11/25/18 1219  12/01/18 0820  NA 138   < > 138  K 3.8   < > 3.3*  CL 101   < > 107  CO2 30   < > 24  GLUCOSE 110*   < > 106*  BUN 23   < > 25*  CREATININE 0.88   < > 0.52  CALCIUM 10.5*   < > 8.7*  MG  --    < > 2.0  AST 39  --   --   ALT 13  --   --   ALKPHOS 66  --   --   BILITOT 0.9  --   --    < > = values in this interval not displayed.   ------------------------------------------------------------------------------------------------------------------  Cardiac Enzymes No results for input(s): TROPONINI in the last 168 hours. ------------------------------------------------------------------------------------------------------------------  RADIOLOGY:  No results found.   ASSESSMENT AND PLAN:  85 year old female patient with history of myeloproliferative neoplasm, progressive leukocytosis, breast cancer, hypertension, hyperlipidemia currently under hospitalist service  -Myeloproliferative neoplasm Oncology follow-up appreciated Chemotherapy to continue Vidaza injection [place] given Venetoclax tabs Watch for nausea  vomiting and cell counts Monitor LDH, uric acid levels  -Hypokalemia Replaced potassium orally  -Elevated uric acid levels Watch for tumor lysis syndrome IV fluids Allopurinol on board Nephrology notified  - Thrombocytopenia Monitor platelet count Platelet transfusion done. Today 19 k, wait and check tomorrow.  -Dehydration IV fluids  -DVT prophylaxis sequential compression device to lower extremities  -Anemia Chronic S/p prbc transfusion Iron supplements Hemoglobin was again less than 7 , so given one more PRBC.  -Constipation Stool softners to continue  Patient would need PICC line placement before discharge on Monday for requirements of blood transfusion from cancer center as outpatient.  All the records are reviewed and case discussed with Care Management/Social Worker. Management plans discussed with the patient, family and they are in agreement.  CODE STATUS: Full code  DVT Prophylaxis: SCDs  TOTAL TIME TAKING CARE OF THIS PATIENT: 36 minutes.   POSSIBLE D/C IN 2 to 3 DAYS, DEPENDING ON CLINICAL CONDITION.  [name] M.D on 12/01/2018 at 2:59 PM  Between 7am to 6pm - Pager - [phone]  After 6pm go to [email or web address] - password [email or web address] [facility]  Office  [phone]  CC: Primary care physician; Rusty Aus, MD  Note: This dictation was prepared with Dragon dictation along with smaller phrase technology. Any transcriptional errors that result from this process are unintentional.

## 2018-12-02 ENCOUNTER — Inpatient Hospital Stay: Payer: Self-pay

## 2018-12-02 ENCOUNTER — Telehealth: Payer: Self-pay | Admitting: Internal Medicine

## 2018-12-02 DIAGNOSIS — C9200 Acute myeloblastic leukemia, not having achieved remission: Secondary | ICD-10-CM

## 2018-12-02 LAB — CBC
HCT: 27 % — ABNORMAL LOW (ref 36.0–46.0)
Hemoglobin: 8.2 g/dL — ABNORMAL LOW (ref 12.0–15.0)
MCH: 23.8 pg — ABNORMAL LOW (ref 26.0–34.0)
MCHC: 30.4 g/dL (ref 30.0–36.0)
MCV: 78.3 fL — ABNORMAL LOW (ref 80.0–100.0)
Platelets: 17 10*3/uL — CL (ref 150–400)
RBC: 3.45 MIL/uL — ABNORMAL LOW (ref 3.87–5.11)
RDW: 19.9 % — ABNORMAL HIGH (ref 11.5–15.5)
WBC: 4.2 10*3/uL (ref 4.0–10.5)
nRBC: 3.3 % — ABNORMAL HIGH (ref 0.0–0.2)

## 2018-12-02 LAB — BPAM PLATELET PHERESIS
Blood Product Expiration Date: 202006232359
ISSUE DATE / TIME: 202006210126
Unit Type and Rh: 5100

## 2018-12-02 LAB — TYPE AND SCREEN
ABO/RH(D): O POS
Antibody Screen: NEGATIVE
Unit division: 0

## 2018-12-02 LAB — PREPARE PLATELET PHERESIS: Unit division: 0

## 2018-12-02 LAB — BPAM RBC
Blood Product Expiration Date: 202007152359
ISSUE DATE / TIME: 202006201813
Unit Type and Rh: 5100

## 2018-12-02 LAB — SARS CORONAVIRUS 2 BY RT PCR (HOSPITAL ORDER, PERFORMED IN ~~LOC~~ HOSPITAL LAB): SARS Coronavirus 2: NEGATIVE

## 2018-12-02 MED ORDER — SODIUM CHLORIDE 0.9% FLUSH: 10.0000 mL | INTRAVENOUS | Status: DC | PRN

## 2018-12-02 MED ORDER — SODIUM CHLORIDE 0.9% FLUSH
10.0000 mL | Freq: Two times a day (BID) | INTRAVENOUS | Status: DC
  Administered 2018-12-02: 12:00:00 10 mL

## 2018-12-02 NOTE — Care Management Important Message (Signed)
Important Message  Patient Details  Name: Kathryn Hahn MRN: [id number] Date of Birth: 09-14-1933   Medicare Important Message Given:  Yes     [name] 12/02/2018, 11:14 AM

## 2018-12-02 NOTE — Addendum Note (Signed)
Addended by: Sabino Gasser on: 12/02/2018 03:37 PM   Modules accepted: Orders

## 2018-12-02 NOTE — Discharge Summary (Signed)
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 Anemia   . Arthritis   . Breast cancer ([place]) 2003   RT LUMPECTOMY  . Edema leg   . History of breast cancer 2003   post lumpectomy  . History of kidney stones   . Hyperlipemia, mixed   . Hypertension, essential   . Leukemia ([place])   . Leukocytosis   . MDS (myelodysplastic syndrome) ([place])   . Personal history of radiation therapy 2003   BREAST CA  . Renal stones   . Vitamin D deficiency     HOSPITAL COURSE:   85 year old female patient with history of myeloproliferative neoplasm, progressive leukocytosis, breast cancer, hypertension, hyperlipidemia currently under hospitalist service  -Myeloproliferative neoplasm Oncology follow-up appreciated Chemotherapy to continue Vidaza injection [place] given Venetoclax tabs Watch for nausea vomiting and cell counts Monitor LDH, uric acid levels  -Hypokalemia Replaced potassium orally  -Elevated uric acid levels Watch for tumor lysis syndrome IV fluids Allopurinol on board Nephrology notified  - Thrombocytopenia Monitor platelet count Platelet transfusion done.   -Dehydration IV fluids  -DVT prophylaxis sequential compression device to lower extremities  -Anemia Chronic S/p prbc transfusion Iron supplements Hemoglobin was again less than 7 , so given one more PRBC.  -Constipation Stool softners to continue  - PICC line placed on Oncology recommendation and  patient was scheduled follow-up appointments with oncology clinic.  DISCHARGE CONDITIONS:   Stable  CONSULTS OBTAINED:  Treatment Team:  Lavonia Dana, MD Sindy Guadeloupe, MD  DRUG ALLERGIES:   Allergies  Allergen Reactions  . Terbinafine Rash and Swelling    DISCHARGE MEDICATIONS:   Allergies as of 12/02/2018      Reactions   Terbinafine Rash, Swelling      Medication List    TAKE these medications   allopurinol 300 MG tablet Commonly known as: ZYLOPRIM Take 1 tablet (300 mg total) by mouth 2 (two) times daily.   atenolol 50 MG tablet Commonly known as: TENORMIN Take 1 tablet by mouth 2 (two) times daily.   CALCIUM-VITAMIN D PO Take 1 tablet by mouth daily.   FISH OIL PO Take 1 capsule by mouth daily.   iron polysaccharides 150 MG capsule Commonly known as: Ferrex 150 Take 1 capsule (150 mg total) by mouth daily. What changed: when to take this   losartan-hydrochlorothiazide 50-12.5 MG tablet Commonly known as: HYZAAR Take 1 tablet by mouth daily.   magic mouthwash w/lidocaine Soln Take 5 mLs by mouth 4 (four) times daily as needed for mouth pain.   potassium chloride SA 20 MEQ tablet Commonly known as: K-DUR 1 pill twice a day   venetoclax 100 MG Tabs Take 400 mg by mouth daily. Take as directed.   vitamin C 500 MG tablet Commonly known as: ASCORBIC ACID Take 1 tablet by mouth 2 (two) times daily.   Vitamin D 50 MCG (2000 UT) tablet Take 1 tablet by mouth daily.        DISCHARGE  INSTRUCTIONS:    Follow with oncology clinic in 2 to 3 days.  If you experience worsening of your admission symptoms, develop shortness of breath, life threatening emergency, suicidal or homicidal thoughts you must seek medical attention immediately by calling 911 or calling your MD immediately  if symptoms less severe.  You Must read complete instructions/literature along with all the possible adverse reactions/side effects for all the Medicines you take and  that have been prescribed to you. Take any new Medicines after you have completely understood and accept all the possible adverse reactions/side effects.   Please note  You were cared for by a hospitalist during your hospital stay. If you have any questions about your discharge medications or the care you received while you were in the hospital after you are discharged, you can call the unit and asked to speak with the hospitalist on call if the hospitalist that took care of you is not available. Once you are discharged, your primary care physician will handle any further medical issues. Please note that NO REFILLS for any discharge medications will be authorized once you are discharged, as it is imperative that you return to your primary care physician (or establish a relationship with a primary care physician if you do not have one) for your aftercare needs so that they can reassess your need for medications and monitor your lab values.    Today   CHIEF COMPLAINT:  No chief complaint on file.   HISTORY OF PRESENT ILLNESS:  Kathryn Hahn  is a 85 y.o. female with a known history of leukemia, myelodysplastic syndrome, hyperlipidemia, hypertension, breast cancer, lumpectomy, vitamin D deficiency was referred by oncology clinic.  Patient planned for chemotherapy.  Has elevated uric acid level and needs assessment for tumor lysis syndrome.  Appears dry and dehydrated.  Recently had a COVID-19 test done couple of days ago which was negative.  No recent travel.  No cough.  No sick contacts at home.  VITAL SIGNS:  Blood pressure (!) 147/81, pulse 72, temperature 97.8 F (36.6 C), temperature source Oral, resp. rate 18, height 4\' 9"  (1.448 m), weight 42.8 kg, SpO2 98 %.  I/O:    Intake/Output Summary (Last 24 hours) at 12/02/2018 1316 Last data filed at 12/01/2018 1338 Gross per 24 hour  Intake 240 ml  Output -  Net 240 ml    PHYSICAL EXAMINATION:  GENERAL:  85 y.o.-year-old patient lying  in the bed with no acute distress.  EYES: Pupils equal, round, reactive to light and accommodation. No scleral icterus. Extraocular muscles intact.  HEENT: Head atraumatic, normocephalic. Oropharynx and nasopharynx clear.  NECK:  Supple, no jugular venous distention. No thyroid enlargement, no tenderness.  LUNGS: Normal breath sounds bilaterally, no wheezing, rales,rhonchi or crepitation. No use of accessory muscles of respiration.  CARDIOVASCULAR: S1, S2 normal. No murmurs, rubs, or gallops.  ABDOMEN: Soft, non-tender, non-distended. Bowel sounds present. No organomegaly or mass.  EXTREMITIES: No pedal edema, cyanosis, or clubbing.  NEUROLOGIC: Cranial nerves II through XII are intact. Muscle strength 4/5 in all extremities. Sensation intact. Gait not checked.  PSYCHIATRIC: The patient is alert and oriented x 3.  SKIN: No obvious rash, lesion, or ulcer. Some echymosis on skin.  DATA REVIEW:   CBC Recent Labs  Lab 12/02/18 0532  WBC 4.2  HGB 8.2*  HCT 27.0*  PLT 17*    Chemistries  Recent Labs  Lab 12/01/18 0820  NA 138  K 3.3*  CL 107  CO2 24  GLUCOSE  106*  BUN 25*  CREATININE 0.52  CALCIUM 8.7*  MG 2.0    Cardiac Enzymes No results for input(s): TROPONINI in the last 168 hours.  Microbiology Results  Results for orders placed or performed during the hospital encounter of 11/25/18  SARS Coronavirus 2 [place] order, Performed in [facility] hospital lab)     Status: None   Collection Time: 12/01/18 11:32 PM  Result Value Ref Range Status   SARS Coronavirus 2 NEGATIVE NEGATIVE Final    Comment: (NOTE) If result is NEGATIVE SARS-CoV-2 target nucleic acids are NOT DETECTED. The SARS-CoV-2 RNA is generally detectable in upper and lower  respiratory specimens during the acute phase of infection. The lowest  concentration of SARS-CoV-2 viral copies this assay can detect is 250  copies / mL. A negative result does not preclude SARS-CoV-2 infection  and should not be  used as the sole basis for treatment or other  patient management decisions.  A negative result may occur with  improper specimen collection / handling, submission of specimen other  than nasopharyngeal swab, presence of viral mutation(s) within the  areas targeted by this assay, and inadequate number of viral copies  (<250 copies / mL). A negative result must be combined with clinical  observations, patient history, and epidemiological information. If result is POSITIVE SARS-CoV-2 target nucleic acids are DETECTED. The SARS-CoV-2 RNA is generally detectable in upper and lower  respiratory specimens dur ing the acute phase of infection.  Positive  results are indicative of active infection with SARS-CoV-2.  Clinical  correlation with patient history and other diagnostic information is  necessary to determine patient infection status.  Positive results do  not rule out bacterial infection or co-infection with other viruses. If result is PRESUMPTIVE POSTIVE SARS-CoV-2 nucleic acids MAY BE PRESENT.   A presumptive positive result was obtained on the submitted specimen  and confirmed on repeat testing.  While 2019 novel coronavirus  (SARS-CoV-2) nucleic acids may be present in the submitted sample  additional confirmatory testing may be necessary for epidemiological  and / or clinical management purposes  to differentiate between  SARS-CoV-2 and other Sarbecovirus currently known to infect humans.  If clinically indicated additional testing with an alternate test  methodology [phone]) is advised. The SARS-CoV-2 RNA is generally  detectable in upper and lower respiratory sp ecimens during the acute  phase of infection. The expected result is Negative. Fact Sheet for Patients:  StrictlyIdeas.no Fact Sheet for Healthcare Providers: BankingDealers.co.za This test is not yet approved or cleared by the [place] FDA and has been authorized  for detection and/or diagnosis of SARS-CoV-2 by FDA under an Emergency Use Authorization (EUA).  This EUA will remain in effect (meaning this test can be used) for the duration of the COVID-19 declaration under Section 564(b)(1) of the Act, 21 U.S.C. section 360bbb-3(b)(1), unless the authorization is terminated or revoked sooner. Performed at [facility], [address] [place], [place] [address]     RADIOLOGY:  [place] Ekg Site Rite  Result Date: 12/02/2018 If [facility] image not attached, placement could not be confirmed due to current cardiac rhythm.   EKG:  No orders found for this or any previous visit.    Management plans discussed with the patient, family and they are in agreement.  CODE STATUS:     Code Status Orders  (From admission, onward)         Start     Ordered   11/25/18 1203  Full code  Continuous     11/25/18 1202        Code Status History    This patient has a current code status but no historical code status.   Advance Care Planning Activity    Advance Directive Documentation     Most Recent Value  Type of Advance Directive  Living will  Pre-existing out of facility DNR order (yellow form or pink MOST form)  -  "MOST" Form in Place?  -      TOTAL TIME TAKING CARE OF THIS PATIENT: 35 minutes.    [name] M.D on 12/02/2018 at 1:16 PM  Between 7am to 6pm - Pager - [phone]  After 6pm go to [email or web address] - password [email or web address] [facility]  Office  [phone]  CC: Primary care physician; Rusty Aus, MD   Note: This dictation was prepared with Dragon dictation along with smaller phrase technology. Any transcriptional errors that result from this process are unintentional.

## 2018-12-02 NOTE — Progress Notes (Signed)
Ever Jagiello   DOB:[birth year]   GU#:[id number]    Subjective: Complains of leg swelling.  Otherwise no nausea vomiting.  No headaches.  Complains of easy bruising.  Objective:  Vitals:   12/01/18 1918 12/02/18 0440  BP: (!) 158/83 (!) 150/82  Pulse: 70 73  Resp: 16 16  Temp: 97.9 F (36.6 C) 97.8 F (36.6 C)  SpO2: 99% 96%     Intake/Output Summary (Last 24 hours) at 12/02/2018 0845 Last data filed at 12/01/2018 1338 Gross per 24 hour  Intake 360 ml  Output -  Net 360 ml    Physical Exam  Constitutional: She is oriented to person, place, and time and well-developed, well-nourished, and in no distress.  Patient walking in the room.  HENT:  Head: Normocephalic and atraumatic.  Mouth/Throat: Oropharynx is clear and moist. No oropharyngeal exudate.  Eyes: Pupils are equal, round, and reactive to light.  Neck: Normal range of motion. Neck supple.  Cardiovascular: Normal rate and regular rhythm.  Pulmonary/Chest: No respiratory distress. She has no wheezes.  Abdominal: Soft. Bowel sounds are normal. She exhibits no distension and no mass. There is no abdominal tenderness. There is no rebound and no guarding.  Musculoskeletal: Normal range of motion.        General: No tenderness or edema.  Neurological: She is alert and oriented to person, place, and time.  Skin: Skin is warm.  Multiple bruises noted on the forearms on the right side and also abdomen.-From subcu injections.  Psychiatric: Affect normal.     Labs:  Lab Results  Component Value Date   WBC 4.2 12/02/2018   HGB 8.2 (L) 12/02/2018   HCT 27.0 (L) 12/02/2018   MCV 78.3 (L) 12/02/2018   PLT 17 (LL) 12/02/2018   NEUTROABS 3.1 12/01/2018    Lab Results  Component Value Date   NA 138 12/01/2018   K 3.3 (L) 12/01/2018   CL 107 12/01/2018   CO2 24 12/01/2018    Studies:  [place] Ekg Site Rite  Result Date: 12/02/2018 If Site Rite image not attached, placement could not be confirmed due to current cardiac  rhythm.   AML (acute myeloid leukemia) with failed remission [facility]) #85 year old female patient with acute myeloid leukemia with monocytic differentiation [20% blasts on peripheral blood flow cytometry]-admit to the hospital for Vidaza plus venetoclax.  #Proceed with Vidaza subcu cycle#1 day 8 today.  White count  4 hemoglobin 8.2 platelets 17 proceed with venetoclax day#6 with 400 mg today.  Patient to be discharged home on venetoclax 400 mg once a day.  Patient had last PRBC transfusion/platelet transfusion on 6/20.   #Anemia/thrombocytopenia -secondary leukemia -status post transfusion-as above.  #Tumor lysis prophylaxis-no evidence of any tumor lysis.  IV fluids could be discontinued.  #Given poor IV access recommend PICC line placement.  Patient agreement.  #Above plan of care was discussed with patient in detail.  Patient could potentially be discharged today.  Will discuss with the patient's husband.  We will follow the patient closely.     Cammie Sickle, MD 12/02/2018  8:45 AM

## 2018-12-02 NOTE — Telephone Encounter (Signed)
Kathryn Hahn/Kathryn Hahn-patient likely to be discharged in the hospital today after she gets a PICC line.  #Please schedule following appointments:  # 6/24-Wednesday-CBC/BMP LDH; hold tube; possible 1 unit of platelet transfusion  #6/26-Friday- at 10:15 MD- labs- CBC BMP LDH; hold tube; possible 1 unit of platelet transfusion.   Thanks.  GB

## 2018-12-02 NOTE — Progress Notes (Signed)
Pt d/c to home with husband. IV removed by  IV team. Single Lumen PICC in place for outpatient infusions. Education completed. VSS. All belongings sent with pt. All questions answered.

## 2018-12-02 NOTE — Plan of Care (Signed)
  Problem: Education: Goal: Knowledge of General Education information will improve Description: Including pain rating scale, medication(s)/side effects and non-pharmacologic comfort measures Outcome: Progressing   Problem: Health Behavior/Discharge Planning: Goal: Ability to manage health-related needs will improve Outcome: Progressing   Problem: Clinical Measurements: Goal: Ability to maintain clinical measurements within normal limits will improve Outcome: Progressing Goal: Will remain free from infection Outcome: Progressing Goal: Diagnostic test results will improve Outcome: Progressing Goal: Respiratory complications will improve Outcome: Progressing Goal: Cardiovascular complication will be avoided Outcome: Progressing   Problem: Activity: Goal: Risk for activity intolerance will decrease Outcome: Progressing   Problem: Nutrition: Goal: Adequate nutrition will be maintained Outcome: Progressing   Problem: Elimination: Goal: Will not experience complications related to bowel motility Outcome: Progressing Goal: Will not experience complications related to urinary retention Outcome: Progressing   Problem: Pain Managment: Goal: General experience of comfort will improve Outcome: Progressing   Problem: Safety: Goal: Ability to remain free from injury will improve Outcome: Progressing   Problem: Skin Integrity: Goal: Risk for impaired skin integrity will decrease Outcome: Progressing   Problem: Education: Goal: Knowledge of the prescribed therapeutic regimen will improve Outcome: Progressing   Problem: Activity: Goal: Ability to perform activities at highest level will improve Outcome: Progressing   Problem: Bowel/Gastric: Goal: Occurrences of nausea will decrease Outcome: Progressing Goal: Bowel function will improve Outcome: Progressing   Problem: Nutritional: Goal: Maintenance of adequate nutrition will improve Outcome: Progressing   Problem:  Clinical Measurements: Goal: Will remain free from infection Outcome: Progressing   Problem: Skin Integrity: Goal: Status of oral mucous membranes will improve Outcome: Progressing

## 2018-12-02 NOTE — Progress Notes (Signed)
Peripherally Inserted Central Catheter/Midline Placement  The IV Nurse has discussed with the patient and/or persons authorized to consent for the patient, the purpose of this procedure and the potential benefits and risks involved with this procedure.  The benefits include less needle sticks, lab draws from the catheter, and the patient may be discharged home with the catheter. Risks include, but not limited to, infection, bleeding, blood clot (thrombus formation), and puncture of an artery; nerve damage and irregular heartbeat and possibility to perform a PICC exchange if needed/ordered by physician.  Alternatives to this procedure were also discussed.  Bard Power PICC patient education guide, fact sheet on infection prevention and patient information card has been provided to patient /or left at bedside.    PICC/Midline Placement Documentation  PICC Single Lumen 12/02/18 PICC Left Brachial 36 cm 0 cm (Active)       Kathryn Hahn 12/02/2018, 11:14 AM

## 2018-12-03 ENCOUNTER — Emergency Department
Admission: EM | Admit: 2018-12-03 | Discharge: 2018-12-03 | Disposition: A | Discharge status: Home / Self Care | Payer: Medicare Other | Attending: Emergency Medicine | Admitting: Emergency Medicine

## 2018-12-03 ENCOUNTER — Telehealth: Payer: Self-pay | Admitting: *Deleted

## 2018-12-03 ENCOUNTER — Encounter: Payer: Self-pay | Admitting: Emergency Medicine

## 2018-12-03 ENCOUNTER — Other Ambulatory Visit: Payer: Self-pay

## 2018-12-03 ENCOUNTER — Inpatient Hospital Stay (HOSPITAL_BASED_OUTPATIENT_CLINIC_OR_DEPARTMENT_OTHER): Payer: Medicare Other | Admitting: Nurse Practitioner

## 2018-12-03 ENCOUNTER — Emergency Department: Payer: Medicare Other

## 2018-12-03 VITALS — BP 152/78 | HR 78 | Temp 96.5°F | Resp 18

## 2018-12-03 DIAGNOSIS — I10 Essential (primary) hypertension: Secondary | ICD-10-CM | POA: Diagnosis not present

## 2018-12-03 DIAGNOSIS — T829XXA Unspecified complication of cardiac and vascular prosthetic device, implant and graft, initial encounter: Secondary | ICD-10-CM

## 2018-12-03 DIAGNOSIS — Z856 Personal history of leukemia: Secondary | ICD-10-CM | POA: Insufficient documentation

## 2018-12-03 DIAGNOSIS — Y658 Other specified misadventures during surgical and medical care: Secondary | ICD-10-CM | POA: Insufficient documentation

## 2018-12-03 DIAGNOSIS — Z79899 Other long term (current) drug therapy: Secondary | ICD-10-CM | POA: Diagnosis not present

## 2018-12-03 DIAGNOSIS — T82838A Hemorrhage of vascular prosthetic devices, implants and grafts, initial encounter: Secondary | ICD-10-CM | POA: Insufficient documentation

## 2018-12-03 DIAGNOSIS — Z452 Encounter for adjustment and management of vascular access device: Secondary | ICD-10-CM | POA: Diagnosis present

## 2018-12-03 DIAGNOSIS — Z853 Personal history of malignant neoplasm of breast: Secondary | ICD-10-CM

## 2018-12-03 DIAGNOSIS — C9200 Acute myeloblastic leukemia, not having achieved remission: Secondary | ICD-10-CM

## 2018-12-03 LAB — CBC WITH DIFFERENTIAL/PLATELET
Abs Immature Granulocytes: 0.08 10*3/uL — ABNORMAL HIGH (ref 0.00–0.07)
Basophils Absolute: 0 10*3/uL (ref 0.0–0.1)
Basophils Relative: 0 %
Eosinophils Absolute: 0 10*3/uL (ref 0.0–0.5)
Eosinophils Relative: 0 %
HCT: 23.3 % — ABNORMAL LOW (ref 36.0–46.0)
Hemoglobin: 7.4 g/dL — ABNORMAL LOW (ref 12.0–15.0)
Immature Granulocytes: 3 %
Lymphocytes Relative: 31 %
Lymphs Abs: 0.9 10*3/uL (ref 0.7–4.0)
MCH: 24.2 pg — ABNORMAL LOW (ref 26.0–34.0)
MCHC: 31.8 g/dL (ref 30.0–36.0)
MCV: 76.1 fL — ABNORMAL LOW (ref 80.0–100.0)
Monocytes Absolute: 0.7 10*3/uL (ref 0.1–1.0)
Monocytes Relative: 26 %
Neutro Abs: 1.1 10*3/uL — ABNORMAL LOW (ref 1.7–7.7)
Neutrophils Relative %: 40 %
Platelets: 16 10*3/uL — CL (ref 150–400)
RBC: 3.06 MIL/uL — ABNORMAL LOW (ref 3.87–5.11)
RDW: 19.9 % — ABNORMAL HIGH (ref 11.5–15.5)
Smear Review: DECREASED
WBC: 2.8 10*3/uL — ABNORMAL LOW (ref 4.0–10.5)
nRBC: 4.3 % — ABNORMAL HIGH (ref 0.0–0.2)

## 2018-12-03 MED ORDER — HEPARIN SOD (PORK) LOCK FLUSH 100 UNIT/ML IV SOLN
250.0000 [IU] | INTRAVENOUS | Status: AC | PRN
  Administered 2018-12-03: 20:00:00 250 [IU]

## 2018-12-03 NOTE — Telephone Encounter (Signed)
Patient will be there at 1130

## 2018-12-03 NOTE — Telephone Encounter (Signed)
Patient was discharged from hospital with a PICC line and it bled last night "I think it is compromised" Asking to come in to be evaluated today. Please advise

## 2018-12-03 NOTE — ED Provider Notes (Signed)
Called by Dr. Burlene Arnt re: PICC line complication. He has also discussed case with Vascular Surgery on call. Recommends U/S of UE as well as evaluation by PICC team. Consider repeat CBC to evaluate platelet count as well if it is not a mechanical issue with the PICC itself. He will be available via telephone. Charge RN aware of pt's arrival.   Duffy Bruce, MD 12/03/18 1451

## 2018-12-03 NOTE — Telephone Encounter (Signed)
Thanks, Lauren. GB 

## 2018-12-03 NOTE — ED Notes (Signed)
-  pt called for in MW and FW with no response

## 2018-12-03 NOTE — ED Notes (Signed)
Placed in room   IV team in with pt at present

## 2018-12-03 NOTE — ED Triage Notes (Signed)
Went home from hospital yesterday--had picc placed during visit.  She woke up today with blood all over.  She has picc in left ac area.  Dressing has dried blood on it and under tegaderm.

## 2018-12-03 NOTE — Progress Notes (Signed)
Assessed LUE PICC site. Pressure drsg. remains in place at this time. No s/sx of bleeding noted at this time. Instructed patient to put pressure to site if bleeding start at home. If bleeding continues she was instructed to return to ER. Patient VU. Fran Lowes, RN VAST

## 2018-12-03 NOTE — ED Notes (Signed)
IV team to lobby to de-access pt

## 2018-12-03 NOTE — ED Notes (Signed)
Pt called for in MW and FW with no response

## 2018-12-03 NOTE — Progress Notes (Signed)
[facility] [place]  [phone] Fax:([phone]  Patient Care Team: Rusty Aus, MD as PCP - General (Internal Medicine)   Name of the patient: Kathryn Hahn  [id number]  03-Jul-1933   Date of visit: 12/03/18  Diagnosis- Acute Myeloid Leukemia  Chief complaint/ Reason for visit- bleeding at PICC site  Heme/Onc history:  Oncology History Overview Note  # SEP 2017- MYELOPROLIFERATIVE NEOPLASM- [WBC- 36; normal Hb/platelets] hypercellular bone marrow 90-95% proliferation of myeloid cells in various stages of maturation; relative erythroid hypoplasia and proliferation of atypical megakaryocytes; no increase in blasts; peripheral blood Bcr-Abl-NEG; Cytogenetics- WNL. NEG- Jak-2/MPL/CALR [place] limited- mild splenomegaly [~10cm; 463cm3]; OCT 2017- second opinion at [facility]. Surveillance.   # With progressive leukocytosis we evaluated her a month ago and sent BM for exam. She has a progressive leukocytosis so hydrea 500mg  daily was started on 10/20/2016 and increased to 2gm daily then back down to one daily, then 5 days per week over time due to counts drop. Then we held her hydrea since 04/09/2018 due to continued declining Hb and Plt.s  BMB 06/24/18 showed markedly hypercellular BM 95% with myeloid hyperplasia and atypical megakaryocytic hyperplasia. No significant increase in blasts. 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 BCR / ABL negative, FISH normal. Flow Showed 2%CD34-positive myeloid blasts. Myeloid precursors with low side scatter. Pathogenic variants were detected in the ASXL1, CCND2, CUX1, and U2AF1 genes.  # March 2020- wbc- 14/ Hb 9.5/ platelets- 54.   ---------------------------------------------------   # June 8th 2020- Acute myeloid leukemia [peripheral blood flow cytometry;NGS- pending]- June 15th Vidaza [SQ 1-5 + Venatoclax- #1in pt]; tumor lysis  prophylaxis    # 2002 [[place]] BREAST CA s/p Lumpect RT; [? Stage I] no chemo s/p AI  DIAGNOSIS: ACUTE MYELOID LEUKEMIA GOALS: pallaitive  CURRENT/MOST RECENT THERAPY : Vidaza+ venotoclax.      MDS (myelodysplastic syndrome) ([place]) (Resolved)  08/28/2018 Initial Diagnosis   MDS (myelodysplastic syndrome) (HCC)   AML (acute myeloid leukemia) with failed remission ([place])  11/25/2018 Initial Diagnosis   AML (acute myeloid leukemia) with failed remission (HCC)     Interval history- Kathryn Hahn, 85 year old female with above history of AML, tumor lysis syndrome, who [facility] for complaints of bleeding from PICC line.  Picc was placed on 12/02/2018 by IV nurse, Christella Noa into left brachial. She was discharged home from hospital on 2/70/3500 without complications. Per patient, she woke last night with blood on sheets and saturated her night clothes.  She noticed dressing was saturated.  She covered with dressing and has continued to notice slow bleeding during the day. She is unaware of any trauma/pulling. No pain. No swelling. She's had bruising on the arm since hospitalization due to poor venous access. No other bleeding/black stools. Feels at baseline otherwise.   ECOG FS:1 - Symptomatic but completely ambulatory  Review of systems- Review of Systems  Constitutional: Positive for malaise/fatigue. Negative for chills, fever and weight loss.  HENT: Negative for nosebleeds.   Respiratory: Negative for cough and shortness of breath.   Cardiovascular: Positive for leg swelling. Negative for chest pain and palpitations.  Gastrointestinal: Negative for abdominal pain, blood in stool and melena.  Genitourinary: Negative for flank pain and hematuria.  Musculoskeletal: Negative for back pain and falls.  Skin:       Bleeding per hpi  Neurological: Negative for dizziness, loss of consciousness and headaches.  Endo/Heme/Allergies: Bruises/bleeds easily.    Psychiatric/Behavioral:  Negative for depression. The patient is not nervous/anxious.     Current treatment- Venetoclax & Vidaza  Allergies  Allergen Reactions   Terbinafine Rash and Swelling    Past Medical History:  Diagnosis Date   Anemia    Arthritis    Breast cancer ([place]) 2003   RT LUMPECTOMY   Edema leg    History of breast cancer 2003   post lumpectomy   History of kidney stones    Hyperlipemia, mixed    Hypertension, essential    Leukemia ([place])    Leukocytosis    MDS (myelodysplastic syndrome) ([place])    Personal history of radiation therapy 2003   BREAST CA   Renal stones    Vitamin D deficiency     Past Surgical History:  Procedure Laterality Date   BREAST BIOPSY Right 2003   Postive for cancer   BREAST LUMPECTOMY Right 2003   BREAST CA   KIDNEY STONE SURGERY Right     Social History   Socioeconomic History   Marital status: Married    Spouse name: Not on file   Number of children: Not on file   Years of education: Not on file   Highest education level: Not on file  Occupational History   Not on file  Social Needs   Financial resource strain: Not on file   Food insecurity    Worry: Not on file    Inability: Not on file   Transportation needs    Medical: Not on file    Non-medical: Not on file  Tobacco Use   Smoking status: Never Smoker   Smokeless tobacco: Never Used  Substance and Sexual Activity   Alcohol use: Yes    Alcohol/week: 3.0 standard drinks    Types: 3 Glasses of wine per week   Drug use: No   Sexual activity: Not on file  Lifestyle   Physical activity    Days per week: Not on file    Minutes per session: Not on file   Stress: Not on file  Relationships   Social connections    Talks on phone: Not on file    Gets together: Not on file    Attends religious service: Not on file    Active member of club or organization: Not on file    Attends meetings of clubs or organizations: Not on file     Relationship status: Not on file   Intimate partner violence    Fear of current or ex partner: Not on file    Emotionally abused: Not on file    Physically abused: Not on file    Forced sexual activity: Not on file  Other Topics Concern   Not on file  Social History Narrative    No smoking/alcohol; lives in [place] with family.  She lives in assisted living with her husband.    Family History  Problem Relation Age of Onset   Stroke Mother    Hypertension Father    Stroke Father    Breast cancer Neg Hx      Current Outpatient Medications:    allopurinol (ZYLOPRIM) 300 MG tablet, Take 1 tablet (300 mg total) by mouth 2 (two) times daily., Disp: 120 tablet, Rfl: 0   atenolol (TENORMIN) 50 MG tablet, Take 1 tablet by mouth 2 (two) times daily., Disp: , Rfl:    CALCIUM-VITAMIN D PO, Take 1 tablet by mouth daily. , Disp: , Rfl:    Cholecalciferol (VITAMIN D) 2000 units tablet, Take 1  tablet by mouth daily., Disp: , Rfl:    iron polysaccharides (FERREX 150) 150 MG capsule, Take 1 capsule (150 mg total) by mouth daily. (Patient taking differently: Take 150 mg by mouth 2 (two) times daily. ), Disp: 90 capsule, Rfl: 1   losartan-hydrochlorothiazide (HYZAAR) 50-12.5 MG tablet, Take 1 tablet by mouth daily., Disp: , Rfl:    magic mouthwash w/lidocaine SOLN, Take 5 mLs by mouth 4 (four) times daily as needed for mouth pain., Disp: 480 mL, Rfl: 3   Omega-3 Fatty Acids (FISH OIL PO), Take 1 capsule by mouth daily., Disp: , Rfl:    potassium chloride SA (K-DUR) 20 MEQ tablet, 1 pill twice a day, Disp: 30 tablet, Rfl: 3   vitamin C (ASCORBIC ACID) 500 MG tablet, Take 1 tablet by mouth 2 (two) times daily., Disp: , Rfl:    venetoclax 100 MG TABS, Take 400 mg by mouth daily. Take as directed., Disp: 120 tablet, Rfl: 0  Physical exam:  Vitals:   12/03/18 1153  BP: (!) 152/78  Pulse: 78  Resp: 18  Temp: (!) 96.5 F (35.8 C)  TempSrc: Tympanic   Physical  Exam Constitutional:      General: She is not in acute distress.    Comments: Frail elderly female. In exam room.   HENT:     Head: Normocephalic.     Mouth/Throat:     Mouth: Mucous membranes are moist.     Pharynx: Oropharynx is clear.  Eyes:     General: No scleral icterus.    Conjunctiva/sclera: Conjunctivae normal.  Cardiovascular:     Rate and Rhythm: Normal rate and regular rhythm.     Pulses:          Radial pulses are 2+ on the right side and 2+ on the left side.     Comments: PICC left brachial- Image included.  Left arm bruising- per patient no worse since hospitalization Pulmonary:     Effort: Pulmonary effort is normal.     Breath sounds: Normal breath sounds.  Abdominal:     Palpations: Abdomen is soft.     Tenderness: There is no abdominal tenderness.  Musculoskeletal:     Right lower leg: Edema present.     Left lower leg: Edema present.  Skin:    Coloration: Skin is pale.  Neurological:     Mental Status: She is alert and oriented to person, place, and time.  Psychiatric:        Mood and Affect: Mood normal.        Behavior: Behavior normal.        CMP Latest Ref Rng & Units 12/01/2018  Glucose 70 - 99 mg/dL 106(H)  BUN 8 - 23 mg/dL 25(H)  Creatinine 0.44 - 1.00 mg/dL 0.52  Sodium 135 - 145 mmol/L 138  Potassium 3.5 - 5.1 mmol/L 3.3(L)  Chloride 98 - 111 mmol/L 107  CO2 22 - 32 mmol/L 24  Calcium 8.9 - 10.3 mg/dL 8.7(L)  Total Protein 6.5 - 8.1 g/dL -  Total Bilirubin 0.3 - 1.2 mg/dL -  Alkaline Phos 38 - 126 U/L -  AST 15 - 41 U/L -  ALT 0 - 44 U/L -   CBC Latest Ref Rng & Units 12/02/2018  WBC 4.0 - 10.5 K/uL 4.2  Hemoglobin 12.0 - 15.0 g/dL 8.2(L)  Hematocrit 36.0 - 46.0 % 27.0(L)  Platelets 150 - 400 K/uL 17(LL)   [place] Ekg Site Rite  Result Date: 12/02/2018 If [employer]  not attached, placement could not be confirmed due to current cardiac rhythm.   Assessment and plan- Patient is a 85 y.o. female diagnosed with AML who presents  to symptom management clinic for bleeding from PICC line.    1. PICC line complication- bleeding from picc site & painful when flushed but does have blood return.  Attempted to contact PICC team for evaluation and management without success. Dr. Rogue Bussing reached out to Dr. Dew/vascular surgery, who recommends ER for evaluation, ultrasound & doppler to rule out clot. PICC may have to be removed and new line placed.   2. Poor Venous Access- picc vs port placed d/t pancytopenia and infection risk. Dr. Rogue Bussing recommends placement of PICC for future access/transfusions/blood draws.   3. Pancytopenia- secondary to AML/chemotherapy and poor venous access. Last received pRBCs on 6/22 and platelets on 6/20. PLT most recently were 17. No labs in clinic given transfer to ER & poor access. Recommend CBC, CMP, PT/PTT in ER with possible transfusion.   4. AML- with failed remission- currently s/p admission for vidaza plus venetoclax. Now on home venetoclax. Continues allopurinol for tumor lysis prophylaxis.   Unable to connect with PICC team for evaluation. Discussed with Dr. Rogue Bussing who recommends ER for evaluation, ultrasound w/ doppler to rule out clot, and evaluation of PICC. I updated Dr. Arsenio Loader. Patient transferred. Dr. Rogue Bussing will discuss case with Dr. Myrene Buddy as well.   Visit Diagnosis 1. Complication associated with peripherally inserted central catheter, initial encounter   2. AML (acute myeloid leukemia) with failed remission ([place])     Patient expressed understanding and was in agreement with this plan. She also understands that She can call clinic at any time with any questions, concerns, or complaints.   Thank you for allowing me to participate in the care of this very pleasant patient.   Beckey Rutter, DNP, AGNP-C [facility] (work cell) [phone] (office)  CC: Dr. Rogue Bussing

## 2018-12-03 NOTE — ED Provider Notes (Signed)
[facility] Emergency Department Provider Note  ____________________________________________  Time seen: Approximately 8:10 PM  I have reviewed the triage vital signs and the nursing notes.   HISTORY  Chief Complaint Vascular Access Problem    HPI Kathryn Hahn is a 85 y.o. female that presents to emergency department for evaluation of bleeding around PICC line that was placed yesterday.  Patient woke up this morning and had blood leaking out of her PICC line.  She followed up with Beckey Rutter NP with oncology today and was referred to the emergency department for reevaluation.  Dr. Burlene Arnt spoke with Dr. Lucky Cowboy and Dr. Ellender Hose, who collectively recommended that patient have an ultrasound, CBC and PICC consult.   Past Medical History:  Diagnosis Date  . Anemia   . Arthritis   . Breast cancer ([place]) 2003   RT LUMPECTOMY  . Edema leg   . History of breast cancer 2003   post lumpectomy  . History of kidney stones   . Hyperlipemia, mixed   . Hypertension, essential   . Leukemia ([place])   . Leukocytosis   . MDS (myelodysplastic syndrome) ([place])   . Personal history of radiation therapy 2003   BREAST CA  . Renal stones   . Vitamin D deficiency     Patient Active Problem List   Diagnosis Date Noted  . AML (acute myeloid leukemia) with failed remission ([place]) 11/25/2018  . Dehydration 11/25/2018  . Tumor lysis syndrome 11/25/2018  . MDS/MPN (myelodysplastic/myeloproliferative neoplasms) ([place]) 08/28/2018  . Goals of care, counseling/discussion 08/28/2018  . Splenomegaly 02/22/2016  . Essential hypertension 02/22/2015  . History of breast cancer 02/22/2015  . History of kidney stones 02/22/2015  . Hyperlipidemia, mixed 02/22/2015  . Vitamin D deficiency 02/22/2015    Past Surgical History:  Procedure Laterality Date  . BREAST BIOPSY Right 2003   Postive for cancer  . BREAST LUMPECTOMY Right 2003   BREAST CA  . KIDNEY STONE SURGERY Right      Prior to Admission medications   Medication Sig Start Date End Date Taking? Authorizing Provider  allopurinol (ZYLOPRIM) 300 MG tablet Take 1 tablet (300 mg total) by mouth 2 (two) times daily. 11/19/18   Cammie Sickle, MD  atenolol (TENORMIN) 50 MG tablet Take 1 tablet by mouth 2 (two) times daily.    [provider]  CALCIUM-VITAMIN D PO Take 1 tablet by mouth daily.     [provider]  Cholecalciferol (VITAMIN D) 2000 units tablet Take 1 tablet by mouth daily.    [provider]  iron polysaccharides (FERREX 150) 150 MG capsule Take 1 capsule (150 mg total) by mouth daily. Patient taking differently: Take 150 mg by mouth 2 (two) times daily.  10/10/18   Cammie Sickle, MD  losartan-hydrochlorothiazide (HYZAAR) 50-12.5 MG tablet Take 1 tablet by mouth daily.    [provider]  magic mouthwash w/lidocaine SOLN Take 5 mLs by mouth 4 (four) times daily as needed for mouth pain. 11/11/18   Cammie Sickle, MD  Omega-3 Fatty Acids (FISH OIL PO) Take 1 capsule by mouth daily.    [provider]  potassium chloride SA (K-DUR) 20 MEQ tablet 1 pill twice a day 11/20/18   Cammie Sickle, MD  venetoclax 100 MG TABS Take 400 mg by mouth daily. Take as directed. 11/19/18   Cammie Sickle, MD  vitamin C (ASCORBIC ACID) 500 MG tablet Take 1 tablet by mouth 2 (two) times daily.  [provider]    Allergies Terbinafine  Family History  Problem Relation Age of Onset  . Stroke Mother   . Hypertension Father   . Stroke Father   . Breast cancer Neg Hx     Social History Social History   Tobacco Use  . Smoking status: Never Smoker  . Smokeless tobacco: Never Used  Substance Use Topics  . Alcohol use: Yes    Alcohol/week: 3.0 standard drinks    Types: 3 Glasses of wine per week  . Drug use: No     Review of Systems  Cardiovascular: No chest pain. Respiratory: No SOB. Musculoskeletal: Negative for  musculoskeletal pain. Skin: Negative for rash, abrasions, lacerations, ecchymosis.  ____________________________________________   PHYSICAL EXAM:  VITAL SIGNS: ED Triage Vitals  Enc Vitals Group     BP 12/03/18 1448 (!) 144/77     Pulse Rate 12/03/18 1448 70     Resp 12/03/18 1448 16     Temp 12/03/18 1448 97.8 F (36.6 C)     Temp Source 12/03/18 1448 Oral     SpO2 12/03/18 1448 98 %     Weight 12/03/18 1449 94 lb 5.7 oz (42.8 kg)     Height 12/03/18 1449 4\' 9"  (1.448 m)     Head Circumference --      Peak Flow --      Pain Score 12/03/18 1449 0     Pain Loc --      Pain Edu? --      Excl. in [place]? --      Constitutional: Alert and oriented. Well appearing and in no acute distress. Eyes: Conjunctivae are normal. PERRL. EOMI. Head: Atraumatic. ENT:      Ears:      Nose: No congestion/rhinnorhea.      Mouth/Throat: Mucous membranes are moist.  Neck: No stridor. Cardiovascular: Normal rate, regular rhythm.  Good peripheral circulation. Respiratory: Normal respiratory effort without tachypnea or retractions. Lungs CTAB. Good air entry to the bases with no decreased or absent breath sounds. Musculoskeletal: Full range of motion to all extremities. No gross deformities appreciated. Neurologic:  Normal speech and language. No gross focal neurologic deficits are appreciated.  Skin:  Skin is warm, dry and intact.  PICC line in place to left arm with changed dressing and no visible blood. Psychiatric: Mood and affect are normal. Speech and behavior are normal. Patient exhibits appropriate insight and judgement.   ____________________________________________   LABS (all labs ordered are listed, but only abnormal results are displayed)  Labs Reviewed  CBC WITH DIFFERENTIAL/PLATELET - Abnormal; Notable for the following components:      Result Value   WBC 2.8 (*)    RBC 3.06 (*)    Hemoglobin 7.4 (*)    HCT 23.3 (*)    MCV 76.1 (*)    MCH 24.2 (*)    RDW 19.9 (*)     Platelets 16 (*)    nRBC 4.3 (*)    Neutro Abs 1.1 (*)    Abs Immature Granulocytes 0.08 (*)    All other components within normal limits  TYPE AND SCREEN   ____________________________________________  EKG   ____________________________________________  RADIOLOGY Robinette Haines, personally viewed and evaluated these images (plain radiographs) as part of my medical decision making, as well as reviewing the written report by the radiologist.  US Venous Img Upper Uni Left  Result Date: 12/03/2018 CLINICAL DATA:  Indwelling left upper extremity PICC line. Left upper extremity edema. EXAM: LEFT  UPPER EXTREMITY VENOUS DOPPLER ULTRASOUND TECHNIQUE: Gray-scale sonography with graded compression, as well as color Doppler and duplex ultrasound were performed to evaluate the upper extremity deep venous system from the level of the subclavian vein and including the jugular, axillary, basilic, radial, ulnar and upper cephalic vein. Spectral Doppler was utilized to evaluate flow at rest and with distal augmentation maneuvers. COMPARISON:  None. FINDINGS: Contralateral Subclavian Vein: Respiratory phasicity is normal and symmetric with the symptomatic side. No evidence of thrombus. Normal compressibility. Internal Jugular Vein: No evidence of thrombus. Normal compressibility, respiratory phasicity and response to augmentation. Subclavian Vein: No evidence of thrombus. Visible PICC line without surrounding thrombus. Normal compressibility, respiratory phasicity and response to augmentation. Axillary Vein: No evidence of thrombus. Visible PICC line without surrounding thrombus. Normal compressibility, respiratory phasicity and response to augmentation. Cephalic Vein: No evidence of thrombus. Normal compressibility, respiratory phasicity and response to augmentation. Basilic Vein: No evidence of thrombus. PICC line visible extending in the left basilic vein without thrombus. Normal compressibility, respiratory  phasicity and response to augmentation. Brachial Veins: No evidence of thrombus. Normal compressibility, respiratory phasicity and response to augmentation. Radial Veins: No evidence of thrombus. Normal compressibility, respiratory phasicity and response to augmentation. Ulnar Veins: No evidence of thrombus. Normal compressibility, respiratory phasicity and response to augmentation. Venous Reflux:  None visualized. Other Findings: No evidence of superficial thrombophlebitis or abnormal fluid collection. IMPRESSION: No evidence of DVT within the left upper extremity. No visible thrombus surrounding an indwelling PICC line. Electronically Signed   By: Aletta Edouard M.D.   On: 12/03/2018 16:00    ____________________________________________    PROCEDURES  Procedure(s) performed:    Procedures    Medications  heparin lock flush 100 unit/mL (250 Units Intracatheter Given 12/03/18 2010)     ____________________________________________   INITIAL IMPRESSION / ASSESSMENT AND PLAN / ED COURSE  Pertinent labs & imaging results that were available during my care of the patient were reviewed by me and considered in my medical decision making (see chart for details).  Review of the [place] CSRS was performed in accordance of the [place] prior to dispensing any controlled drugs.     Patient presented to emergency department for evaluation of leaking PICC line that was placed yesterday.  Vital signs and exam are reassuring.  RN changed PICC line dressing prior to my assessment.  Patient did not have any further bleeding in the emergency department after dressing change.  [name] PICC does not see any mechanical issue with PICC line and states that it flushes and draws well.  No DVT on ultrasound.  CBC consistent with previous. Ace wrap was applied for pressure. Dr. Joni Fears was consulted and recommends that patient be discharged home since she has had no further bleeding and workup has been reassuring with  close follow-up.  Patient will return to the emergency department for any further bleeding.  Patient has an appointment for blood work tomorrow.  She will call Dr. Rogue Bussing tomorrow.  patient is to follow up with oncology as directed. Patient is given ED precautions to return to the ED for any worsening or new symptoms.  Wilhelmenia Mcneese was evaluated in Emergency Department on 12/03/2018 for the symptoms described in the history of present illness. She was evaluated in the context of the global COVID-19 pandemic, which necessitated consideration that the patient might be at risk for infection with the SARS-CoV-2 virus that causes COVID-19. Institutional protocols and algorithms that pertain to the evaluation of patients at risk for COVID-19 are  in a state of rapid change based on information released by regulatory bodies including the CDC and federal and state organizations. These policies and algorithms were followed during the patient's care in the ED.     ____________________________________________  FINAL CLINICAL IMPRESSION(S) / ED DIAGNOSES  Final diagnoses:  Bleeding from peripherally inserted central venous catheter (PICC), initial encounter ([place])      NEW MEDICATIONS STARTED DURING THIS VISIT:  ED Discharge Orders    None          This chart was dictated using voice recognition software/Dragon. Despite best efforts to proofread, errors can occur which can change the meaning. Any change was purely unintentional.    Laban Emperor, PA-C 12/03/18 2315    Carrie Mew, MD 12/04/18 2322

## 2018-12-03 NOTE — ED Notes (Signed)
Called and looked for the pt in MW and the FW and pt not responding or seen

## 2018-12-03 NOTE — Discharge Instructions (Addendum)
Please follow-up with your lab draw appointment in the morning.  Please call Dr. Rogue Bussing and Dr. Sabra Heck tomorrow for follow-up.  Please return to the emergency department if PICC line begins to bleed again.

## 2018-12-03 NOTE — ED Notes (Signed)
Called ultrasound, pt is currently in ultrasound, [employer]

## 2018-12-03 NOTE — Telephone Encounter (Signed)
[facility] please 

## 2018-12-04 ENCOUNTER — Inpatient Hospital Stay: Payer: Medicare Other

## 2018-12-04 ENCOUNTER — Telehealth: Payer: Self-pay | Admitting: Nurse Practitioner

## 2018-12-04 ENCOUNTER — Telehealth: Payer: Self-pay | Admitting: Internal Medicine

## 2018-12-04 NOTE — Telephone Encounter (Signed)
Reviewed the patient's ER visit.  Ultrasound upper extremity negative for any blood clot.  Patient discharged home after PICC line dressing change.   Lauren-please check with the patient regarding bleeding from the PICC site.  Blood counts in the emergency room ~hemoglobin 7.2/platelets 16.   If patient is not bleeding from the PICC site-I think it is reasonable to hold off platelet transfusion today/can cancel appointment.  H/C- For 6/26-Friday- visit with me- please also schedule for 1 unit of PRBC; in addition to 1 unit of platelet transfusion.  Lauren- call me after you speak to pt. Thank you, all.

## 2018-12-04 NOTE — Telephone Encounter (Signed)
Called patient. No bleeding overnight. Dressing intact. She feels well. Dr. Rogue Bussing recommends holding off on platelet transfusion today and will plan for platelet and prbcs on Friday. Patient agreeable with this plan and thanked for follow up.

## 2018-12-05 ENCOUNTER — Other Ambulatory Visit: Payer: Self-pay

## 2018-12-06 ENCOUNTER — Other Ambulatory Visit: Payer: Self-pay | Admitting: *Deleted

## 2018-12-06 ENCOUNTER — Inpatient Hospital Stay: Payer: Medicare Other | Admitting: *Deleted

## 2018-12-06 ENCOUNTER — Inpatient Hospital Stay: Payer: Medicare Other

## 2018-12-06 ENCOUNTER — Inpatient Hospital Stay (HOSPITAL_BASED_OUTPATIENT_CLINIC_OR_DEPARTMENT_OTHER): Payer: Medicare Other | Admitting: Internal Medicine

## 2018-12-06 ENCOUNTER — Other Ambulatory Visit: Payer: Self-pay

## 2018-12-06 DIAGNOSIS — C9200 Acute myeloblastic leukemia, not having achieved remission: Secondary | ICD-10-CM

## 2018-12-06 DIAGNOSIS — I10 Essential (primary) hypertension: Secondary | ICD-10-CM | POA: Diagnosis not present

## 2018-12-06 DIAGNOSIS — Z95828 Presence of other vascular implants and grafts: Secondary | ICD-10-CM

## 2018-12-06 DIAGNOSIS — Z853 Personal history of malignant neoplasm of breast: Secondary | ICD-10-CM

## 2018-12-06 DIAGNOSIS — Z79899 Other long term (current) drug therapy: Secondary | ICD-10-CM

## 2018-12-06 DIAGNOSIS — D649 Anemia, unspecified: Secondary | ICD-10-CM

## 2018-12-06 LAB — BASIC METABOLIC PANEL
Anion gap: 10 (ref 5–15)
BUN: 19 mg/dL (ref 8–23)
CO2: 27 mmol/L (ref 22–32)
Calcium: 9.3 mg/dL (ref 8.9–10.3)
Chloride: 100 mmol/L (ref 98–111)
Creatinine, Ser: 0.65 mg/dL (ref 0.44–1.00)
GFR calc Af Amer: >60 mL/min (ref >60)
GFR calc non Af Amer: >60 mL/min (ref >60)
Glucose, Bld: 139 mg/dL — ABNORMAL HIGH (ref 70–99)
Potassium: 3.7 mmol/L (ref 3.5–5.1)
Sodium: 137 mmol/L (ref 135–145)

## 2018-12-06 LAB — CBC WITH DIFFERENTIAL/PLATELET
Abs Immature Granulocytes: 0.09 10*3/uL — ABNORMAL HIGH (ref 0.00–0.07)
Basophils Absolute: 0 10*3/uL (ref 0.0–0.1)
Basophils Relative: 0 %
Eosinophils Absolute: 0 10*3/uL (ref 0.0–0.5)
Eosinophils Relative: 0 %
HCT: 23.4 % — ABNORMAL LOW (ref 36.0–46.0)
Hemoglobin: 7.2 g/dL — ABNORMAL LOW (ref 12.0–15.0)
Immature Granulocytes: 5 %
Lymphocytes Relative: 27 %
Lymphs Abs: 0.5 10*3/uL — ABNORMAL LOW (ref 0.7–4.0)
MCH: 23.8 pg — ABNORMAL LOW (ref 26.0–34.0)
MCHC: 30.8 g/dL (ref 30.0–36.0)
MCV: 77.5 fL — ABNORMAL LOW (ref 80.0–100.0)
Monocytes Absolute: 0.3 10*3/uL (ref 0.1–1.0)
Monocytes Relative: 14 %
Neutro Abs: 1.1 10*3/uL — ABNORMAL LOW (ref 1.7–7.7)
Neutrophils Relative %: 54 %
Platelets: 22 10*3/uL — CL (ref 150–400)
RBC: 3.02 MIL/uL — ABNORMAL LOW (ref 3.87–5.11)
RDW: 20.2 % — ABNORMAL HIGH (ref 11.5–15.5)
Smear Review: NORMAL
WBC: 1.9 10*3/uL — ABNORMAL LOW (ref 4.0–10.5)
nRBC: 6.8 % — ABNORMAL HIGH (ref 0.0–0.2)

## 2018-12-06 LAB — SAMPLE TO BLOOD BANK

## 2018-12-06 LAB — LACTATE DEHYDROGENASE: LDH: 810 U/L — ABNORMAL HIGH (ref 98–192)

## 2018-12-06 LAB — PREPARE RBC (CROSSMATCH)

## 2018-12-06 MED ORDER — HEPARIN SOD (PORK) LOCK FLUSH 100 UNIT/ML IV SOLN
250.0000 [IU] | Freq: Once | INTRAVENOUS | Status: AC
  Administered 2018-12-06: 250 [IU] via INTRAVENOUS
  Filled 2018-12-06: qty 5

## 2018-12-06 MED ORDER — ACETAMINOPHEN 325 MG PO TABS
650.0000 mg | ORAL_TABLET | Freq: Once | ORAL | Status: AC
  Administered 2018-12-06: 650 mg via ORAL
  Filled 2018-12-06: qty 2

## 2018-12-06 MED ORDER — SODIUM CHLORIDE 0.9% FLUSH
10.0000 mL | INTRAVENOUS | Status: AC | PRN
  Administered 2018-12-06: 10 mL
  Filled 2018-12-06: qty 10

## 2018-12-06 MED ORDER — SODIUM CHLORIDE 0.9% IV SOLUTION
250.0000 mL | Freq: Once | INTRAVENOUS | Status: AC
  Administered 2018-12-06: 250 mL via INTRAVENOUS
  Filled 2018-12-06: qty 250

## 2018-12-06 MED ORDER — DIPHENHYDRAMINE HCL 25 MG PO CAPS
25.0000 mg | ORAL_CAPSULE | Freq: Once | ORAL | Status: AC
  Administered 2018-12-06: 13:00:00 25 mg via ORAL
  Filled 2018-12-06: qty 1

## 2018-12-06 MED ORDER — SODIUM CHLORIDE 0.9% FLUSH
10.0000 mL | Freq: Once | INTRAVENOUS | Status: AC
  Administered 2018-12-06: 10:00:00 10 mL via INTRAVENOUS
  Filled 2018-12-06: qty 10

## 2018-12-06 NOTE — Assessment & Plan Note (Addendum)
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 Currently on Vidaza + Venatoclax   # Cycle 1; day-10 of venatoclax. Hb 7.2/ platelets 22. WBC 1.9; absolute neutrophil count 1.1.  LDH improving.  Will discuss regarding bone marrow biopsy at day 28.  #Severe anemia hemoglobin 7.2; proceed with PRBC transfusion irradiated; hold off platelet transfusion.  If significant drop in neutrophils count is noted-I would recommend initiation of prophylactic levofloxacin/antifungal.  Monitor closely.  # Bil LE swelling-secondary to IV hydration- recommend leg elevation; hold of diuretics.  # leaking around PICC site-recent ultrasound the emergency room negative.  Recommend PICC line dressing change.  # DISPOSITION: print out of labs  # PRBC 1 unit today; PICC dressing change.  # 6/30- cbc/hold tube- possible platelets/ 11unit PRBC # 7/02 [ok to double book]- MD-cbc/BMP/LDH- Dr.B

## 2018-12-06 NOTE — Progress Notes (Signed)
Kathryn Hahn CONSULT NOTE  Patient Care Team: Kathryn Aus, MD as PCP - General (Internal Medicine)  CHIEF COMPLAINTS/PURPOSE OF CONSULTATION:    Oncology History Overview Note  # SEP 2017- MYELOPROLIFERATIVE NEOPLASM- [WBC- 88; normal Hb/platelets] hypercellular bone marrow 90-95% proliferation of myeloid cells in various stages of maturation; relative erythroid hypoplasia and proliferation of atypical megakaryocytes; no increase in blasts; peripheral blood Bcr-Abl-NEG; Cytogenetics- WNL. NEG- Jak-2/MPL/CALR [place] limited- mild splenomegaly [~10cm; 463cm3]; OCT 2017- second opinion at [facility]. Surveillance.   # With progressive leukocytosis we evaluated her a month ago and sent BM for exam. She has a progressive leukocytosis so hydrea 582m daily was started on 10/20/2016 and increased to 2gm daily then back down to one daily, then 5 days per week over time due to counts drop. Then we held her hydrea since 04/09/2018 due to continued declining Hb and Plt.s  BMB 06/24/18 showed markedly hypercellular BM 95% with myeloid hyperplasia and atypical megakaryocytic hyperplasia. No significant increase in blasts. 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 BCR / ABL negative, FISH normal. Flow Showed 2%CD34-positive myeloid blasts. Myeloid precursors with low side scatter. Pathogenic variants were detected in the ASXL1, CCND2, CUX1, and U2AF1 genes.  # March 2020- wbc- 14/ Hb 9.5/ platelets- 54.   ---------------------------------------------------   # June 8th 2020- Acute myeloid leukemia [peripheral blood flow cytometry;NGS- pending]- June 15th Vidaza [SQ 1-5 + Venatoclax- #1in pt]; tumor lysis prophylaxis    # 2002 [[place]] BREAST CA s/p Lumpect RT; [? Stage I] no chemo s/p AI  DIAGNOSIS: ACUTE MYELOID LEUKEMIA GOALS: pallaitive  CURRENT/MOST RECENT THERAPY : Vidaza+ venotoclax.      MDS (myelodysplastic syndrome)  ([name] ([facility])  08/28/2018 Initial Diagnosis   MDS (myelodysplastic syndrome) (HCC)   [name]  11/25/2018 Initial Diagnosis   AML (acute myeloid leukemia) with failed remission (HCC)      HISTORY OF PRESENTING ILLNESS:  Kathryn Hahn 85y.o.  female with acute myeloid leukemia with monocytic differentiation currently on Vidaza plus venetoclax is here for follow-up.  Initial hospital stay for the venetoclax induction-was uneventful.  Patient did not have any lab or clinical evidence of tumor lysis.  Patient needed platelet transfusion blood transfusion.  Patient was recently evaluated in the symptomatic clinic/emergency room-for bleeding around the PICC line site.  Ultrasound negative; patient was discharged home after getting a new PIC line dressing.  She continues to notice mild oozing around the site of the PICC site.  Denies any gum bleeding or nosebleeds.  Complains of mild to moderate fatigue.  No fever chills.  Complains swelling in the legs.  Review of Systems  Constitutional: Positive for malaise/fatigue. Negative for chills, diaphoresis, fever and weight loss.  HENT: Negative for nosebleeds and sore throat.   Eyes: Negative for double vision.  Respiratory: Positive for shortness of breath. Negative for cough, hemoptysis, sputum production and wheezing.   Cardiovascular: Positive for leg swelling. Negative for chest pain, palpitations and orthopnea.  Gastrointestinal: Negative for abdominal pain, blood in stool, constipation, diarrhea, heartburn, melena, nausea and vomiting.  Genitourinary: Negative for dysuria, frequency and urgency.  Musculoskeletal: Negative for back pain and joint pain.  Skin: Negative.  Negative for itching and rash.  Neurological: Negative for dizziness, tingling, focal weakness, weakness and headaches.  Endo/Heme/Allergies: Bruises/bleeds easily.  Psychiatric/Behavioral: Negative for depression. The  patient is not nervous/anxious and does not have insomnia.        MEDICAL HISTORY:  Past Medical History:  Diagnosis Date  . Anemia   . Arthritis   . Breast cancer ([place]) 2003   RT LUMPECTOMY  . Edema leg   . History of breast cancer 2003   post lumpectomy  . History of kidney stones   . Hyperlipemia, mixed   . Hypertension, essential   . Leukemia ([place])   . Leukocytosis   . MDS (myelodysplastic syndrome) ([place])   . Personal history of radiation therapy 2003   BREAST CA  . Renal stones   . Vitamin D deficiency     SURGICAL HISTORY: Past Surgical History:  Procedure Laterality Date  . BREAST BIOPSY Right 2003   Postive for cancer  . BREAST LUMPECTOMY Right 2003   BREAST CA  . KIDNEY STONE SURGERY Right     SOCIAL HISTORY: Social History   Socioeconomic History  . Marital status: Married    Spouse name: Not on file  . Number of children: Not on file  . Years of education: Not on file  . Highest education level: Not on file  Occupational History  . Not on file  Social Needs  . Financial resource strain: Not on file  . Food insecurity    Worry: Not on file    Inability: Not on file  . Transportation needs    Medical: Not on file    Non-medical: Not on file  Tobacco Use  . Smoking status: Never Smoker  . Smokeless tobacco: Never Used  Substance and Sexual Activity  . Alcohol use: Yes    Alcohol/week: 3.0 standard drinks    Types: 3 Glasses of wine per week  . Drug use: No  . Sexual activity: Not on file  Lifestyle  . Physical activity    Days per week: Not on file    Minutes per session: Not on file  . Stress: Not on file  Relationships  . Social Herbalist on phone: Not on file    Gets together: Not on file    Attends religious service: Not on file    Active member of club or organization: Not on file    Attends meetings of clubs or organizations: Not on file    Relationship status: Not on file  . Intimate partner violence    Fear of  current or ex partner: Not on file    Emotionally abused: Not on file    Physically abused: Not on file    Forced sexual activity: Not on file  Other Topics Concern  . Not on file  Social History Narrative    No smoking/alcohol; lives in [place] with family.  She lives in assisted living with her husband.    FAMILY HISTORY: Family History  Problem Relation Age of Onset  . Stroke Mother   . Hypertension Father   . Stroke Father   . Breast cancer Neg Hx     ALLERGIES:  is allergic to terbinafine.  MEDICATIONS:  Current Outpatient Medications  Medication Sig Dispense Refill  . allopurinol (ZYLOPRIM) 300 MG tablet Take 1 tablet (300 mg total) by mouth 2 (two) times daily. 120 tablet 0  . atenolol (TENORMIN) 50 MG tablet Take 1 tablet by mouth 2 (two) times daily.    Marland Kitchen CALCIUM-VITAMIN D PO Take 1 tablet by mouth daily.     . Cholecalciferol (VITAMIN D) 2000 units tablet Take 1 tablet by mouth daily.    . iron polysaccharides (FERREX 150) 150 MG capsule Take 1 capsule (150  mg total) by mouth daily. (Patient taking differently: Take 150 mg by mouth 2 (two) times daily. ) 90 capsule 1  . losartan-hydrochlorothiazide (HYZAAR) 50-12.5 MG tablet Take 1 tablet by mouth daily.    . magic mouthwash w/lidocaine SOLN Take 5 mLs by mouth 4 (four) times daily as needed for mouth pain. 480 mL 3  . Omega-3 Fatty Acids (FISH OIL PO) Take 1 capsule by mouth daily.    . potassium chloride SA (K-DUR) 20 MEQ tablet 1 pill twice a day 30 tablet 3  . venetoclax 100 MG TABS Take 400 mg by mouth daily. Take as directed. 120 tablet 0  . vitamin C (ASCORBIC ACID) 500 MG tablet Take 1 tablet by mouth 2 (two) times daily.     No current facility-administered medications for this visit.    Facility-Administered Medications Ordered in Other Visits  Medication Dose Route Frequency Provider Last Rate Last Dose  . heparin lock flush 100 unit/mL  250 Units Intravenous Once Cammie Sickle, MD           .  PHYSICAL EXAMINATION: ECOG PERFORMANCE STATUS: 0 - Asymptomatic  Vitals:   12/06/18 1028  BP: 138/76  Pulse: 72  Resp: 18  Temp: (!) 96.3 F (35.7 C)   Filed Weights   12/06/18 1028  Weight: 94 lb 4.8 oz (42.8 kg)    Physical Exam  Constitutional: She is oriented to person, place, and time and well-developed, well-nourished, and in no distress.  HENT:  Head: Normocephalic and atraumatic.  Mouth/Throat: Oropharynx is clear and moist. No oropharyngeal exudate.  Eyes: Pupils are equal, round, and reactive to light.  Neck: Normal range of motion. Neck supple.  Cardiovascular: Normal rate and regular rhythm.  Pulmonary/Chest: No respiratory distress. She has no wheezes.  Abdominal: Soft. Bowel sounds are normal. She exhibits no distension and no mass. There is no abdominal tenderness. There is no rebound and no guarding.  Musculoskeletal: Normal range of motion.        General: Edema present. No tenderness.  Neurological: She is alert and oriented to person, place, and time.  Skin: Skin is warm.  Multiple bruises noted.  Positive for mild bleeding around the site of the PICC line insertion.  Psychiatric: Affect normal.  ;   LABORATORY DATA:  I have reviewed the data as listed Lab Results  Component Value Date   WBC 1.9 (L) 12/06/2018   HGB 7.2 (L) 12/06/2018   HCT 23.4 (L) 12/06/2018   MCV 77.5 (L) 12/06/2018   PLT 22 (LL) 12/06/2018   Recent Labs    11/20/18 0814 11/25/18 0904 11/25/18 1219  12/01/18 0000 12/01/18 0820 12/06/18 1008  NA 140 137 138   < > 137 138 137  K 2.9* 3.7 3.8   < > 3.5 3.3* 3.7  CL 99 99 101   < > 107 107 100  CO2 _0 < > _1 GLUCOSE 130* 159* 110*   < > 109* 106* 139*  BUN 24* 24* 23   < > 30* 25* 19  CREATININE 0.90 0.90 0.88   < > 0.54 0.52 0.65  CALCIUM 10.3 10.1 10.5*   < > 8.9 8.7* 9.3  GFRNONAA 60* 60* >60   < > >60 >60 >60  GFRAA >60 >60 >60   < > >60 >60 >60  PROT 7.1 7.0 7.4  --   --   --   --   ALBUMIN  4.0 4.1 4.3  --   --   --   --  AST 39 37 39  --   --   --   --   ALT _0 --   --   --   --   ALKPHOS 65 62 66  --   --   --   --   BILITOT 0.7 0.5 0.9  --   --   --   --    < > = values in this interval not displayed.    RADIOGRAPHIC STUDIES: I have personally reviewed the radiological images as listed and agreed with the findings in the report. US Venous Img Upper Uni Left  Result Date: 12/03/2018 CLINICAL DATA:  Indwelling left upper extremity PICC line. Left upper extremity edema. EXAM: LEFT UPPER EXTREMITY VENOUS DOPPLER ULTRASOUND TECHNIQUE: Gray-scale sonography with graded compression, as well as color Doppler and duplex ultrasound were performed to evaluate the upper extremity deep venous system from the level of the subclavian vein and including the jugular, axillary, basilic, radial, ulnar and upper cephalic vein. Spectral Doppler was utilized to evaluate flow at rest and with distal augmentation maneuvers. COMPARISON:  None. FINDINGS: Contralateral Subclavian Vein: Respiratory phasicity is normal and symmetric with the symptomatic side. No evidence of thrombus. Normal compressibility. Internal Jugular Vein: No evidence of thrombus. Normal compressibility, respiratory phasicity and response to augmentation. Subclavian Vein: No evidence of thrombus. Visible PICC line without surrounding thrombus. Normal compressibility, respiratory phasicity and response to augmentation. Axillary Vein: No evidence of thrombus. Visible PICC line without surrounding thrombus. Normal compressibility, respiratory phasicity and response to augmentation. Cephalic Vein: No evidence of thrombus. Normal compressibility, respiratory phasicity and response to augmentation. Basilic Vein: No evidence of thrombus. PICC line visible extending in the left basilic vein without thrombus. Normal compressibility, respiratory phasicity and response to augmentation. Brachial Veins: No evidence of thrombus. Normal  compressibility, respiratory phasicity and response to augmentation. Radial Veins: No evidence of thrombus. Normal compressibility, respiratory phasicity and response to augmentation. Ulnar Veins: No evidence of thrombus. Normal compressibility, respiratory phasicity and response to augmentation. Venous Reflux:  None visualized. Other Findings: No evidence of superficial thrombophlebitis or abnormal fluid collection. IMPRESSION: No evidence of DVT within the left upper extremity. No visible thrombus surrounding an indwelling PICC line. Electronically Signed   By: Aletta Edouard M.D.   On: 12/03/2018 16:00   [place] Ekg Site Rite  Result Date: 12/02/2018 If Site Rite image not attached, placement could not be confirmed due to current cardiac rhythm.   ASSESSMENT & PLAN:   AML (acute myeloid leukemia) with failed remission (HCC) #Acute myeloid leukemia with monocytic differentiation [20% blasts on peripheral blood flow cytometry]. Currently on Vidaza + Venatoclax   # Cycle 1; day-10 of venatoclax. Hb 7.2/ platelets 22. WBC 1.9; absolute neutrophil count 1.1.  LDH improving.  Will discuss regarding bone marrow biopsy at day 28.  #Severe anemia hemoglobin 7.2; proceed with PRBC transfusion irradiated; hold off platelet transfusion.  If significant drop in neutrophils count is noted-I would recommend initiation of prophylactic levofloxacin/antifungal.  Monitor closely.  # Bil LE swelling-secondary to IV hydration- recommend leg elevation; hold of diuretics.  # leaking around PICC site-recent ultrasound the emergency room negative.  Recommend PICC line dressing change.  # DISPOSITION: print out of labs  # PRBC 1 unit today; PICC dressing change.  # 6/30- cbc/hold tube- possible platelets/ 11unit PRBC # 7/02 [ok to double book]- MD-cbc/BMP/LDH- Dr.B         Cammie Sickle, MD 12/06/2018 1:01 PM

## 2018-12-07 LAB — TYPE AND SCREEN
ABO/RH(D): O POS
Antibody Screen: NEGATIVE
Unit division: 0

## 2018-12-07 LAB — BPAM RBC
Blood Product Expiration Date: 202007182359
ISSUE DATE / TIME: 202006261250
Unit Type and Rh: 5100

## 2018-12-09 ENCOUNTER — Encounter: Payer: Self-pay | Admitting: Internal Medicine

## 2018-12-09 ENCOUNTER — Other Ambulatory Visit: Payer: Self-pay

## 2018-12-10 ENCOUNTER — Inpatient Hospital Stay: Payer: Medicare Other | Admitting: *Deleted

## 2018-12-10 ENCOUNTER — Other Ambulatory Visit: Payer: Self-pay

## 2018-12-10 ENCOUNTER — Other Ambulatory Visit: Payer: Self-pay | Admitting: *Deleted

## 2018-12-10 ENCOUNTER — Inpatient Hospital Stay: Payer: Medicare Other

## 2018-12-10 DIAGNOSIS — C9200 Acute myeloblastic leukemia, not having achieved remission: Secondary | ICD-10-CM

## 2018-12-10 DIAGNOSIS — Z452 Encounter for adjustment and management of vascular access device: Secondary | ICD-10-CM

## 2018-12-10 LAB — CBC WITH DIFFERENTIAL/PLATELET
Abs Immature Granulocytes: 0.01 10*3/uL (ref 0.00–0.07)
Basophils Absolute: 0 10*3/uL (ref 0.0–0.1)
Basophils Relative: 0 %
Eosinophils Absolute: 0 10*3/uL (ref 0.0–0.5)
Eosinophils Relative: 0 %
HCT: 27.2 % — ABNORMAL LOW (ref 36.0–46.0)
Hemoglobin: 8.1 g/dL — ABNORMAL LOW (ref 12.0–15.0)
Immature Granulocytes: 1 %
Lymphocytes Relative: 30 %
Lymphs Abs: 0.4 10*3/uL — ABNORMAL LOW (ref 0.7–4.0)
MCH: 23.8 pg — ABNORMAL LOW (ref 26.0–34.0)
MCHC: 29.8 g/dL — ABNORMAL LOW (ref 30.0–36.0)
MCV: 80 fL (ref 80.0–100.0)
Monocytes Absolute: 0.2 10*3/uL (ref 0.1–1.0)
Monocytes Relative: 18 %
Neutro Abs: 0.7 10*3/uL — ABNORMAL LOW (ref 1.7–7.7)
Neutrophils Relative %: 51 %
Platelets: 12 10*3/uL — CL (ref 150–400)
RBC: 3.4 MIL/uL — ABNORMAL LOW (ref 3.87–5.11)
RDW: 20.3 % — ABNORMAL HIGH (ref 11.5–15.5)
Smear Review: DECREASED
WBC: 1.3 10*3/uL — CL (ref 4.0–10.5)
nRBC: 6.4 % — ABNORMAL HIGH (ref 0.0–0.2)

## 2018-12-10 LAB — SAMPLE TO BLOOD BANK

## 2018-12-10 MED ORDER — SODIUM CHLORIDE 0.9% FLUSH
10.0000 mL | Freq: Once | INTRAVENOUS | Status: AC
  Administered 2018-12-10: 10:00:00 10 mL via INTRAVENOUS
  Filled 2018-12-10: qty 10

## 2018-12-10 MED ORDER — SODIUM CHLORIDE 0.9% IV SOLUTION
250.0000 mL | Freq: Once | INTRAVENOUS | Status: AC
  Administered 2018-12-10: 250 mL via INTRAVENOUS
  Filled 2018-12-10: qty 250

## 2018-12-10 MED ORDER — ACETAMINOPHEN 325 MG PO TABS
650.0000 mg | ORAL_TABLET | Freq: Once | ORAL | Status: AC
  Administered 2018-12-10: 11:00:00 650 mg via ORAL
  Filled 2018-12-10: qty 2

## 2018-12-10 MED ORDER — DIPHENHYDRAMINE HCL 25 MG PO CAPS
25.0000 mg | ORAL_CAPSULE | Freq: Once | ORAL | Status: AC
  Administered 2018-12-10: 11:00:00 25 mg via ORAL
  Filled 2018-12-10: qty 1

## 2018-12-10 MED ORDER — HEPARIN SOD (PORK) LOCK FLUSH 100 UNIT/ML IV SOLN
500.0000 [IU] | Freq: Once | INTRAVENOUS | Status: AC
  Administered 2018-12-10: 250 [IU] via INTRAVENOUS
  Filled 2018-12-10: qty 5

## 2018-12-11 LAB — BPAM PLATELET PHERESIS
Blood Product Expiration Date: 202007032359
ISSUE DATE / TIME: 202006301158
Unit Type and Rh: 5100

## 2018-12-11 LAB — PREPARE PLATELET PHERESIS: Unit division: 0

## 2018-12-12 ENCOUNTER — Inpatient Hospital Stay: Payer: Medicare Other | Attending: Internal Medicine | Admitting: *Deleted

## 2018-12-12 ENCOUNTER — Inpatient Hospital Stay (HOSPITAL_BASED_OUTPATIENT_CLINIC_OR_DEPARTMENT_OTHER): Payer: Medicare Other | Admitting: Internal Medicine

## 2018-12-12 ENCOUNTER — Other Ambulatory Visit: Payer: Self-pay

## 2018-12-12 DIAGNOSIS — Z79899 Other long term (current) drug therapy: Secondary | ICD-10-CM

## 2018-12-12 DIAGNOSIS — C9200 Acute myeloblastic leukemia, not having achieved remission: Secondary | ICD-10-CM | POA: Insufficient documentation

## 2018-12-12 DIAGNOSIS — Z95828 Presence of other vascular implants and grafts: Secondary | ICD-10-CM

## 2018-12-12 DIAGNOSIS — Z853 Personal history of malignant neoplasm of breast: Secondary | ICD-10-CM

## 2018-12-12 DIAGNOSIS — R634 Abnormal weight loss: Secondary | ICD-10-CM | POA: Diagnosis not present

## 2018-12-12 DIAGNOSIS — D702 Other drug-induced agranulocytosis: Secondary | ICD-10-CM | POA: Diagnosis not present

## 2018-12-12 DIAGNOSIS — K5900 Constipation, unspecified: Secondary | ICD-10-CM | POA: Diagnosis not present

## 2018-12-12 DIAGNOSIS — Z452 Encounter for adjustment and management of vascular access device: Secondary | ICD-10-CM

## 2018-12-12 LAB — CBC WITH DIFFERENTIAL/PLATELET
Abs Immature Granulocytes: 0.04 10*3/uL (ref 0.00–0.07)
Basophils Absolute: 0 10*3/uL (ref 0.0–0.1)
Basophils Relative: 0 %
Eosinophils Absolute: 0 10*3/uL (ref 0.0–0.5)
Eosinophils Relative: 0 %
HCT: 25.7 % — ABNORMAL LOW (ref 36.0–46.0)
Hemoglobin: 8 g/dL — ABNORMAL LOW (ref 12.0–15.0)
Immature Granulocytes: 3 %
Lymphocytes Relative: 38 %
Lymphs Abs: 0.5 10*3/uL — ABNORMAL LOW (ref 0.7–4.0)
MCH: 24 pg — ABNORMAL LOW (ref 26.0–34.0)
MCHC: 31.1 g/dL (ref 30.0–36.0)
MCV: 77.2 fL — ABNORMAL LOW (ref 80.0–100.0)
Monocytes Absolute: 0.2 10*3/uL (ref 0.1–1.0)
Monocytes Relative: 14 %
Neutro Abs: 0.6 10*3/uL — ABNORMAL LOW (ref 1.7–7.7)
Neutrophils Relative %: 45 %
Platelets: 17 10*3/uL — CL (ref 150–400)
RBC: 3.33 MIL/uL — ABNORMAL LOW (ref 3.87–5.11)
RDW: 20.1 % — ABNORMAL HIGH (ref 11.5–15.5)
Smear Review: DECREASED
WBC: 1.3 10*3/uL — CL (ref 4.0–10.5)
nRBC: 6.4 % — ABNORMAL HIGH (ref 0.0–0.2)

## 2018-12-12 LAB — BASIC METABOLIC PANEL
Anion gap: 10 (ref 5–15)
BUN: 28 mg/dL — ABNORMAL HIGH (ref 8–23)
CO2: 26 mmol/L (ref 22–32)
Calcium: 9.1 mg/dL (ref 8.9–10.3)
Chloride: 99 mmol/L (ref 98–111)
Creatinine, Ser: 0.69 mg/dL (ref 0.44–1.00)
GFR calc Af Amer: >60 mL/min (ref >60)
GFR calc non Af Amer: >60 mL/min (ref >60)
Glucose, Bld: 152 mg/dL — ABNORMAL HIGH (ref 70–99)
Potassium: 4 mmol/L (ref 3.5–5.1)
Sodium: 135 mmol/L (ref 135–145)

## 2018-12-12 LAB — SAMPLE TO BLOOD BANK

## 2018-12-12 LAB — LACTATE DEHYDROGENASE: LDH: 447 U/L — ABNORMAL HIGH (ref 98–192)

## 2018-12-12 MED ORDER — ACYCLOVIR 400 MG PO TABS: ORAL_TABLET | ORAL | 3 refills | Status: DC

## 2018-12-12 MED ORDER — SODIUM CHLORIDE 0.9% FLUSH
10.0000 mL | Freq: Once | INTRAVENOUS | Status: AC
  Administered 2018-12-12: 12:00:00 10 mL via INTRAVENOUS
  Filled 2018-12-12: qty 10

## 2018-12-12 MED ORDER — HEPARIN SOD (PORK) LOCK FLUSH 100 UNIT/ML IV SOLN
500.0000 [IU] | Freq: Once | INTRAVENOUS | Status: AC
  Administered 2018-12-12: 12:00:00 500 [IU] via INTRAVENOUS

## 2018-12-12 MED ORDER — LEVOFLOXACIN 250 MG PO TABS: 250.0000 mg | ORAL_TABLET | Freq: Every day | ORAL | 3 refills | Status: DC

## 2018-12-12 NOTE — Progress Notes (Signed)
[name] CONSULT NOTE  Patient Care Team: Rusty Aus, MD as PCP - General (Internal Medicine)  CHIEF COMPLAINTS/PURPOSE OF CONSULTATION:    Oncology History Overview Note  # SEP 2017- MYELOPROLIFERATIVE NEOPLASM- [WBC- 27; normal Hb/platelets] hypercellular bone marrow 90-95% proliferation of myeloid cells in various stages of maturation; relative erythroid hypoplasia and proliferation of atypical megakaryocytes; no increase in blasts; peripheral blood Bcr-Abl-NEG; Cytogenetics- WNL. NEG- Jak-2/MPL/CALR [place] limited- mild splenomegaly [~10cm; 463cm3]; OCT 2017- second opinion at [place]. Surveillance.   # With progressive leukocytosis we evaluated her a month ago and sent BM for exam. She has a progressive leukocytosis so hydrea '500mg'$  daily was started on 10/20/2016 and increased to 2gm daily then back down to one daily, then 5 days per week over time due to counts drop. Then we held her hydrea since 04/09/2018 due to continued declining Hb and Plt.s  BMB 06/24/18 showed markedly hypercellular BM 95% with myeloid hyperplasia and atypical megakaryocytic hyperplasia. No significant increase in blasts. 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 BCR / ABL negative, FISH normal. Flow Showed 2%CD34-positive myeloid blasts. Myeloid precursors with low side scatter. Pathogenic variants were detected in the ASXL1, CCND2, CUX1, and U2AF1 genes.  # March 2020- wbc- 14/ Hb 9.5/ platelets- 54.   ---------------------------------------------------   # June 8th 2020- Acute myeloid leukemia [peripheral blood flow cytometry;NGS- pending]- June 15th Vidaza [SQ 1-5 + Venatoclax- #1in pt]; tumor lysis prophylaxis    # 2002 [[place]] BREAST CA s/p Lumpect RT; [? Stage I] no chemo s/p AI  DIAGNOSIS: ACUTE MYELOID LEUKEMIA GOALS: pallaitive  CURRENT/MOST RECENT THERAPY : Vidaza+ venotoclax.      MDS (myelodysplastic syndrome)  ([place]) (Resolved)  08/28/2018 Initial Diagnosis   MDS (myelodysplastic syndrome) (HCC)   AML (acute myeloid leukemia) with failed remission ([place])  11/25/2018 Initial Diagnosis   AML (acute myeloid leukemia) with failed remission (HCC)      HISTORY OF PRESENTING ILLNESS:  Kathryn Hahn 85 y.o.  female with acute myeloid leukemia with monocytic differentiation currently on Vidaza plus venetoclax is here for follow-up.  Patient continues to complain of abdominal discomfort from the hematomas from her Vidaza subcu injections.  She continues to complain of fatigue.  Denies any worsening shortness of breath or cough or fevers or chills.  PEG site bleeding is improved.   Review of Systems  Constitutional: Positive for malaise/fatigue. Negative for chills, diaphoresis, fever and weight loss.  HENT: Negative for nosebleeds and sore throat.   Eyes: Negative for double vision.  Respiratory: Positive for shortness of breath. Negative for cough, hemoptysis, sputum production and wheezing.   Cardiovascular: Positive for leg swelling. Negative for chest pain, palpitations and orthopnea.  Gastrointestinal: Negative for abdominal pain, blood in stool, constipation, diarrhea, heartburn, melena, nausea and vomiting.  Genitourinary: Negative for dysuria, frequency and urgency.  Musculoskeletal: Negative for back pain and joint pain.  Skin: Negative.  Negative for itching and rash.  Neurological: Negative for dizziness, tingling, focal weakness, weakness and headaches.  Endo/Heme/Allergies: Bruises/bleeds easily.  Psychiatric/Behavioral: Negative for depression. The patient is not nervous/anxious and does not have insomnia.        MEDICAL HISTORY:  Past Medical History:  Diagnosis Date  . Anemia   . Arthritis   . Breast cancer ([place]) 2003   RT LUMPECTOMY  . Edema leg   . History of breast cancer 2003   post lumpectomy  . History of kidney stones   . Hyperlipemia, mixed   .  Hypertension,  essential   . Leukemia ([place])   . Leukocytosis   . MDS (myelodysplastic syndrome) ([place])   . Personal history of radiation therapy 2003   BREAST CA  . Renal stones   . Vitamin D deficiency     SURGICAL HISTORY: Past Surgical History:  Procedure Laterality Date  . BREAST BIOPSY Right 2003   Postive for cancer  . BREAST LUMPECTOMY Right 2003   BREAST CA  . KIDNEY STONE SURGERY Right     SOCIAL HISTORY: Social History   Socioeconomic History  . Marital status: Married    Spouse name: Not on file  . Number of children: Not on file  . Years of education: Not on file  . Highest education level: Not on file  Occupational History  . Not on file  Social Needs  . Financial resource strain: Not on file  . Food insecurity    Worry: Not on file    Inability: Not on file  . Transportation needs    Medical: Not on file    Non-medical: Not on file  Tobacco Use  . Smoking status: Never Smoker  . Smokeless tobacco: Never Used  Substance and Sexual Activity  . Alcohol use: Yes    Alcohol/week: 3.0 standard drinks    Types: 3 Glasses of wine per week  . Drug use: No  . Sexual activity: Not on file  Lifestyle  . Physical activity    Days per week: Not on file    Minutes per session: Not on file  . Stress: Not on file  Relationships  . Social Herbalist on phone: Not on file    Gets together: Not on file    Attends religious service: Not on file    Active member of club or organization: Not on file    Attends meetings of clubs or organizations: Not on file    Relationship status: Not on file  . Intimate partner violence    Fear of current or ex partner: Not on file    Emotionally abused: Not on file    Physically abused: Not on file    Forced sexual activity: Not on file  Other Topics Concern  . Not on file  Social History Narrative    No smoking/alcohol; lives in [place] with family.  She lives in assisted living with her husband.    FAMILY  HISTORY: Family History  Problem Relation Age of Onset  . Stroke Mother   . Hypertension Father   . Stroke Father   . Breast cancer Neg Hx     ALLERGIES:  is allergic to terbinafine.  MEDICATIONS:  Current Outpatient Medications  Medication Sig Dispense Refill  . allopurinol (ZYLOPRIM) 300 MG tablet Take 1 tablet (300 mg total) by mouth 2 (two) times daily. 120 tablet 0  . atenolol (TENORMIN) 50 MG tablet Take 1 tablet by mouth 2 (two) times daily.    Marland Kitchen CALCIUM-VITAMIN D PO Take 1 tablet by mouth daily.     . Cholecalciferol (VITAMIN D) 2000 units tablet Take 1 tablet by mouth daily.    . iron polysaccharides (FERREX 150) 150 MG capsule Take 1 capsule (150 mg total) by mouth daily. (Patient taking differently: Take 150 mg by mouth 2 (two) times daily. ) 90 capsule 1  . losartan-hydrochlorothiazide (HYZAAR) 50-12.5 MG tablet Take 1 tablet by mouth daily.    . magic mouthwash w/lidocaine SOLN Take 5 mLs by mouth 4 (four) times daily as needed  for mouth pain. 480 mL 3  . Omega-3 Fatty Acids (FISH OIL PO) Take 1 capsule by mouth daily.    . potassium chloride SA (K-DUR) 20 MEQ tablet 1 pill twice a day 30 tablet 3  . vitamin C (ASCORBIC ACID) 500 MG tablet Take 1 tablet by mouth 2 (two) times daily.    Marland Kitchen acyclovir (ZOVIRAX) 400 MG tablet One pill a day [to prevent shingles] 30 tablet 3  . levofloxacin (LEVAQUIN) 250 MG tablet Take 1 tablet (250 mg total) by mouth daily. 30 tablet 3  . VENCLEXTA 100 MG TABS TAKE 4 TABLETS BY MOUTH DAILY AS DIRECTED 120 tablet 0   No current facility-administered medications for this visit.       Marland Kitchen  PHYSICAL EXAMINATION: ECOG PERFORMANCE STATUS: 0 - Asymptomatic  Vitals:   12/12/18 1150  BP: 127/81  Pulse: 63  Resp: 16  Temp: (!) 95.4 F (35.2 C)   Filed Weights   12/12/18 1150  Weight: 97 lb 8 oz (44.2 kg)    Physical Exam  Constitutional: She is oriented to person, place, and time and well-developed, well-nourished, and in no distress.   HENT:  Head: Normocephalic and atraumatic.  Mouth/Throat: Oropharynx is clear and moist. No oropharyngeal exudate.  Eyes: Pupils are equal, round, and reactive to light.  Neck: Normal range of motion. Neck supple.  Cardiovascular: Normal rate and regular rhythm.  Pulmonary/Chest: No respiratory distress. She has no wheezes.  Abdominal: Soft. Bowel sounds are normal. She exhibits no distension and no mass. There is no abdominal tenderness. There is no rebound and no guarding.  Musculoskeletal: Normal range of motion.        General: Edema present. No tenderness.  Neurological: She is alert and oriented to person, place, and time.  Skin: Skin is warm.  Multiple bruises noted.  Positive for mild bleeding around the site of the PICC line insertion.  Multiple abdominal wall bruises noted.  Psychiatric: Affect normal.  ;   LABORATORY DATA:  I have reviewed the data as listed Lab Results  Component Value Date   WBC 1.3 (LL) 12/12/2018   HGB 8.0 (L) 12/12/2018   HCT 25.7 (L) 12/12/2018   MCV 77.2 (L) 12/12/2018   PLT 17 (LL) 12/12/2018   Recent Labs    11/20/18 0814 11/25/18 0904 11/25/18 1219  12/01/18 0820 12/06/18 1008 12/12/18 1123  NA 140 137 138   < > 138 137 135  K 2.9* 3.7 3.8   < > 3.3* 3.7 4.0  CL 99 99 101   < > 107 100 99  CO2 '31 27 30   '$ < > '24 27 26  '$ GLUCOSE 130* 159* 110*   < > 106* 139* 152*  BUN 24* 24* 23   < > 25* 19 28*  CREATININE 0.90 0.90 0.88   < > 0.52 0.65 0.69  CALCIUM 10.3 10.1 10.5*   < > 8.7* 9.3 9.1  GFRNONAA 60* 60* >60   < > >60 >60 >60  GFRAA >60 >60 >60   < > >60 >60 >60  PROT 7.1 7.0 7.4  --   --   --   --   ALBUMIN 4.0 4.1 4.3  --   --   --   --   AST 39 37 39  --   --   --   --   ALT '14 13 13  '$ --   --   --   --   ALKPHOS 65 62 66  --   --   --   --  BILITOT 0.7 0.5 0.9  --   --   --   --    < > = values in this interval not displayed.    RADIOGRAPHIC STUDIES: I have personally reviewed the radiological images as listed and  agreed with the findings in the report. US Venous Img Upper Uni Left  Result Date: 12/03/2018 CLINICAL DATA:  Indwelling left upper extremity PICC line. Left upper extremity edema. EXAM: LEFT UPPER EXTREMITY VENOUS DOPPLER ULTRASOUND TECHNIQUE: Gray-scale sonography with graded compression, as well as color Doppler and duplex ultrasound were performed to evaluate the upper extremity deep venous system from the level of the subclavian vein and including the jugular, axillary, basilic, radial, ulnar and upper cephalic vein. Spectral Doppler was utilized to evaluate flow at rest and with distal augmentation maneuvers. COMPARISON:  None. FINDINGS: Contralateral Subclavian Vein: Respiratory phasicity is normal and symmetric with the symptomatic side. No evidence of thrombus. Normal compressibility. Internal Jugular Vein: No evidence of thrombus. Normal compressibility, respiratory phasicity and response to augmentation. Subclavian Vein: No evidence of thrombus. Visible PICC line without surrounding thrombus. Normal compressibility, respiratory phasicity and response to augmentation. Axillary Vein: No evidence of thrombus. Visible PICC line without surrounding thrombus. Normal compressibility, respiratory phasicity and response to augmentation. Cephalic Vein: No evidence of thrombus. Normal compressibility, respiratory phasicity and response to augmentation. Basilic Vein: No evidence of thrombus. PICC line visible extending in the left basilic vein without thrombus. Normal compressibility, respiratory phasicity and response to augmentation. Brachial Veins: No evidence of thrombus. Normal compressibility, respiratory phasicity and response to augmentation. Radial Veins: No evidence of thrombus. Normal compressibility, respiratory phasicity and response to augmentation. Ulnar Veins: No evidence of thrombus. Normal compressibility, respiratory phasicity and response to augmentation. Venous Reflux:  None visualized. Other  Findings: No evidence of superficial thrombophlebitis or abnormal fluid collection. IMPRESSION: No evidence of DVT within the left upper extremity. No visible thrombus surrounding an indwelling PICC line. Electronically Signed   By: Aletta Edouard M.D.   On: 12/03/2018 16:00   [place] Ekg Site Rite  Result Date: 12/02/2018 If Site Rite image not attached, placement could not be confirmed due to current cardiac rhythm.   ASSESSMENT & PLAN:   AML (acute myeloid leukemia) with failed remission (HCC) #Acute myeloid leukemia with monocytic differentiation [20% blasts on peripheral blood flow cytometry]. Currently on Vidaza + Venatoclax   # Cycle 1; day-17 of venatoclax. Hb 8 platelets17 WBC 1.3; absolute neutrophil count 0.6  LDH improving.  #Patient concerned about hematomas from subcu Vidaza.  IV Vidaza moving forward.  Discussed regarding repeating a bone marrow biopsy at around day 28 of chemo.  Patient agreement.  #TLS prophylaxis-allopurinol; will stop at next visit.  #Antimicrobial prophylaxis-recommend Levaquin 250 mg/daily; acyclovir 400 mg once a day.  If continued neutropenic would recommend addition of fungal prophylaxis.   # DISPOSITION: print out of labs  # 7/07 cbc/hold tube- possible 1platelets/ 1unit PRBC # 7/09- MD-cbc/BMP/LDH/hold tube- Dr.B         Cammie Sickle, MD 12/16/2018 8:50 PM

## 2018-12-12 NOTE — Progress Notes (Signed)
Were able to draw labs from PICC line today.  Patient reports feeling much better than last visit and would like to discuss further treatment options.

## 2018-12-12 NOTE — Assessment & Plan Note (Addendum)
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 Currently on Vidaza + Venatoclax   # Cycle 1; day-17 of venatoclax. Hb 8 platelets17 WBC 1.3; absolute neutrophil count 0.6  LDH improving.  #Patient concerned about hematomas from subcu Vidaza.  IV Vidaza moving forward.  Discussed regarding repeating a bone marrow biopsy at around day 28 of chemo.  Patient agreement.  #TLS prophylaxis-allopurinol; will stop at next visit.  #Antimicrobial prophylaxis-recommend Levaquin 250 mg/daily; acyclovir 400 mg once a day.  If continued neutropenic would recommend addition of fungal prophylaxis.   # DISPOSITION: print out of labs  # 7/07 cbc/hold tube- possible 1platelets/ 1unit PRBC # 7/09- MD-cbc/BMP/LDH/hold tube- Dr.B

## 2018-12-16 ENCOUNTER — Other Ambulatory Visit: Payer: Self-pay | Admitting: Internal Medicine

## 2018-12-16 DIAGNOSIS — D469 Myelodysplastic syndrome, unspecified: Secondary | ICD-10-CM

## 2018-12-16 NOTE — Telephone Encounter (Signed)
CBC with Differential Order: [id number] Status:  Final result Visible to patient:  No (not released) Next appt:  12/17/2018 at 08:30 AM in Oncology ([facility]) Dx:  AML (acute myeloid leukemia) with fai...  Ref Range & Units 4d ago 6d ago 10d ago  WBC 4.0 - 10.5 K/uL 1.3Low Panic   1.3Low Panic  CM  1.9Low    Comment: REPEATED TO VERIFY  [facility] CRITICAL VALUE PROTOCOL   RBC 3.87 - 5.11 MIL/uL 3.33Low   3.40Low   3.02Low    Hemoglobin 12.0 - 15.0 g/dL 8.0Low   8.1Low   7.2Low  CM   Comment: Reticulocyte Hemoglobin testing  may be clinically indicated,  consider ordering this additional  test [id number]   HCT 36.0 - 46.0 % 25.7Low   27.2Low   23.4Low    MCV 80.0 - 100.0 fL 77.2Low   80.0  77.5Low    MCH 26.0 - 34.0 pg 24.0Low   23.8Low   23.8Low    MCHC 30.0 - 36.0 g/dL 31.1  29.8Low   30.8   RDW 11.5 - 15.5 % 20.1High   20.3High   20.2High    Platelets 150 - 400 K/uL 17Low Panic   12Low Panic  CM  22Low Panic  CM   Comment: REPEATED TO VERIFY  PLATELET COUNT CONFIRMED BY SMEAR  Immature Platelet Fraction may be  clinically indicated, consider  ordering this additional test  [id number]  THIS CRITICAL RESULT HAS VERIFIED AND BEEN CALLED TO [name] SMITH BY KIM ROOS ON 07 02 2020 AT [phone], AND HAS BEEN READ BACK.   nRBC 0.0 - 0.2 % 6.4High   6.4High   6.8High    Neutrophils Relative % % 45  51  54   Neutro Abs 1.7 - 7.7 K/uL 0.6Low   0.7Low   1.1Low    Lymphocytes Relative % 38  30  27   Lymphs Abs 0.7 - 4.0 K/uL 0.5Low   0.4Low   0.5Low    Monocytes Relative % 14  18  14    Monocytes Absolute 0.1 - 1.0 K/uL 0.2  0.2  0.3   Eosinophils Relative % 0  0  0   Eosinophils Absolute 0.0 - 0.5 K/uL 0.0  0.0  0.0   Basophils Relative % 0  0  0   Basophils Absolute 0.0 - 0.1 K/uL 0.0  0.0  0.0   WBC Morphology  TO FEW TO COUNT. SMEAR SCANNED  MORPHOLOGY UNREMARKABLE    RBC Morphology  MIXED RBC POPULATION HYPOCHROMIA PRESENT  MIXED RBC POPULATION  FRAGMENTS NOTED   Smear Review   PLATELETS APPEAR DECREASED  PLATELETS APPEAR DECREASED CM  Normal platelet morphology CM   Immature Granulocytes % 3  1  5    Abs Immature Granulocytes 0.00 - 0.07 K/uL 0.04  0.01 CM  0.09High    Schistocytes  PRESENT     Tear Drop Cells  PRESENT   PRESENT   Polychromasia  PRESENT     Ovalocytes  PRESENT   PRESENT CM   Comment: Performed at [facility], [address] [place],  [address]  Resulting Agency  [facility] CLIN LAB [facility] CLIN LAB [facility] CLIN LAB      Specimen Collected: 12/12/18 11:23 Last Resulted: 12/12/18 12:28     Lab Flowsheet   Order Details   View Encounter   Lab and Collection Details   Routing   Result History     CM=Additional comments      Other Results  from 12/12/2018  Hold Tube- Blood Bank Order: [id number]  Status:  Final result Visible to patient:  No (not released) Next appt:  12/17/2018 at 08:30 AM in Oncology ([facility]) Dx:  AML (acute myeloid leukemia) with fai... Component 4d ago  Blood Bank Specimen SAMPLE AVAILABLE FOR TESTING   Sample Expiration 12/15/2018,2359  Performed at [facility], [address] [place], [place] [address]   Resulting Agency [facility] CLIN LAB      Specimen Collected: 12/12/18 11:23 Last Resulted: 12/12/18 13:53     Lab Flowsheet   Order Details   View Encounter   Lab and Collection Details   Routing   Result History           Contains abnormal data Lactate dehydrogenase Order: [id number]  Status:  Final result Visible to patient:  No (not released) Next appt:  12/17/2018 at 08:30 AM in Oncology ([facility]) Dx:  AML (acute myeloid leukemia) with fai...  Ref Range & Units 4d ago 10d ago 2wk ago  LDH 98 - 192 U/L 447High   810High  CM  1,888High  CM   Comment: Performed at [facility], [address] [place], [place] [address]  Resulting Agency  [facility] CLIN LAB [facility] CLIN LAB [facility] CLIN LAB      Specimen Collected: 12/12/18 11:23 Last Resulted: 12/12/18 11:57     Lab Flowsheet    Order Details   View Encounter   Lab and Collection Details   Routing   Result History     CM=Additional comments        Contains abnormal data Basic metabolic panel Order: [id number]  Status:  Final result Visible to patient:  No (not released) Next appt:  12/17/2018 at 08:30 AM in Oncology ([facility]) Dx:  AML (acute myeloid leukemia) with fai...  Ref Range & Units 4d ago 10d ago 2wk ago  Sodium 135 - 145 mmol/L 135  137  138   Potassium 3.5 - 5.1 mmol/L 4.0  3.7  3.3Low    Chloride 98 - 111 mmol/L 99  100  107   CO2 22 - 32 mmol/L 26  27  24    Glucose, Bld 70 - 99 mg/dL 152High   139High   106High    BUN 8 - 23 mg/dL 28High   19  25High    Creatinine, Ser 0.44 - 1.00 mg/dL 0.69  0.65  0.52   Calcium 8.9 - 10.3 mg/dL 9.1  9.3  8.7Low    GFR calc non Af Amer >60 mL/min >60  >60  >60   GFR calc Af Amer >60 mL/min >60  >60  >60   Anion gap 5 - 15 10  10  CM  7 CM   Comment: Performed at [facility], [address] [place], [place] [address]  Resulting Agency  [facility] CLIN LAB [facility] CLIN LAB [facility] CLIN LAB      Specimen Collected: 12/12/18 11:23 Last Resulted: 12/12/18 11:57

## 2018-12-17 ENCOUNTER — Other Ambulatory Visit: Payer: Self-pay

## 2018-12-17 ENCOUNTER — Inpatient Hospital Stay: Payer: Medicare Other | Admitting: *Deleted

## 2018-12-17 ENCOUNTER — Other Ambulatory Visit: Payer: Self-pay | Admitting: *Deleted

## 2018-12-17 ENCOUNTER — Inpatient Hospital Stay: Payer: Medicare Other

## 2018-12-17 DIAGNOSIS — C9200 Acute myeloblastic leukemia, not having achieved remission: Secondary | ICD-10-CM

## 2018-12-17 DIAGNOSIS — Z95828 Presence of other vascular implants and grafts: Secondary | ICD-10-CM

## 2018-12-17 DIAGNOSIS — D61810 Antineoplastic chemotherapy induced pancytopenia: Secondary | ICD-10-CM

## 2018-12-17 LAB — BASIC METABOLIC PANEL
Anion gap: 9 (ref 5–15)
BUN: 33 mg/dL — ABNORMAL HIGH (ref 8–23)
CO2: 26 mmol/L (ref 22–32)
Calcium: 9.1 mg/dL (ref 8.9–10.3)
Chloride: 101 mmol/L (ref 98–111)
Creatinine, Ser: 0.83 mg/dL (ref 0.44–1.00)
GFR calc Af Amer: >60 mL/min (ref >60)
GFR calc non Af Amer: >60 mL/min (ref >60)
Glucose, Bld: 132 mg/dL — ABNORMAL HIGH (ref 70–99)
Potassium: 4 mmol/L (ref 3.5–5.1)
Sodium: 136 mmol/L (ref 135–145)

## 2018-12-17 LAB — CBC WITH DIFFERENTIAL/PLATELET
Abs Immature Granulocytes: 0.03 10*3/uL (ref 0.00–0.07)
Basophils Absolute: 0 10*3/uL (ref 0.0–0.1)
Basophils Relative: 0 %
Eosinophils Absolute: 0 10*3/uL (ref 0.0–0.5)
Eosinophils Relative: 0 %
HCT: 23.1 % — ABNORMAL LOW (ref 36.0–46.0)
Hemoglobin: 7.2 g/dL — ABNORMAL LOW (ref 12.0–15.0)
Immature Granulocytes: 4 %
Lymphocytes Relative: 52 %
Lymphs Abs: 0.4 10*3/uL — ABNORMAL LOW (ref 0.7–4.0)
MCH: 24.2 pg — ABNORMAL LOW (ref 26.0–34.0)
MCHC: 31.2 g/dL (ref 30.0–36.0)
MCV: 77.5 fL — ABNORMAL LOW (ref 80.0–100.0)
Monocytes Absolute: 0.1 10*3/uL (ref 0.1–1.0)
Monocytes Relative: 13 %
Neutro Abs: 0.2 10*3/uL — ABNORMAL LOW (ref 1.7–7.7)
Neutrophils Relative %: 31 %
Platelets: 14 10*3/uL — CL (ref 150–400)
RBC: 2.98 MIL/uL — ABNORMAL LOW (ref 3.87–5.11)
RDW: 20 % — ABNORMAL HIGH (ref 11.5–15.5)
Smear Review: DECREASED
WBC: 0.7 10*3/uL — CL (ref 4.0–10.5)
nRBC: 11.4 % — ABNORMAL HIGH (ref 0.0–0.2)

## 2018-12-17 LAB — PREPARE RBC (CROSSMATCH)

## 2018-12-17 LAB — LACTATE DEHYDROGENASE: LDH: 326 U/L — ABNORMAL HIGH (ref 98–192)

## 2018-12-17 LAB — SAMPLE TO BLOOD BANK

## 2018-12-17 MED ORDER — ACETAMINOPHEN 325 MG PO TABS
650.0000 mg | ORAL_TABLET | Freq: Once | ORAL | Status: AC
  Administered 2018-12-17: 650 mg via ORAL
  Filled 2018-12-17: qty 2

## 2018-12-17 MED ORDER — SODIUM CHLORIDE 0.9% IV SOLUTION
250.0000 mL | Freq: Once | INTRAVENOUS | Status: AC
  Administered 2018-12-17: 10:00:00 250 mL via INTRAVENOUS
  Filled 2018-12-17: qty 250

## 2018-12-17 MED ORDER — DIPHENHYDRAMINE HCL 25 MG PO CAPS
25.0000 mg | ORAL_CAPSULE | Freq: Once | ORAL | Status: AC
  Administered 2018-12-17: 25 mg via ORAL
  Filled 2018-12-17: qty 1

## 2018-12-17 MED ORDER — HEPARIN SOD (PORK) LOCK FLUSH 100 UNIT/ML IV SOLN
500.0000 [IU] | Freq: Once | INTRAVENOUS | Status: AC
  Administered 2018-12-17: 500 [IU] via INTRAVENOUS
  Filled 2018-12-17: qty 5

## 2018-12-17 MED ORDER — SODIUM CHLORIDE 0.9% FLUSH
10.0000 mL | Freq: Once | INTRAVENOUS | Status: AC
  Administered 2018-12-17: 09:00:00 10 mL via INTRAVENOUS
  Filled 2018-12-17: qty 10

## 2018-12-17 MED FILL — VENCLEXTA 100 MG TABS: 100 | 30 days supply | Qty: 120 | Fill #0

## 2018-12-18 LAB — BPAM RBC
Blood Product Expiration Date: 202007242359
ISSUE DATE / TIME: 202007071301
Unit Type and Rh: 5100

## 2018-12-18 LAB — BPAM PLATELET PHERESIS
Blood Product Expiration Date: 202007092359
ISSUE DATE / TIME: 202007071209
Unit Type and Rh: 6200

## 2018-12-18 LAB — TYPE AND SCREEN
ABO/RH(D): O POS
Antibody Screen: NEGATIVE
Unit division: 0

## 2018-12-18 LAB — PREPARE PLATELET PHERESIS: Unit division: 0

## 2018-12-19 ENCOUNTER — Telehealth: Payer: Self-pay | Admitting: *Deleted

## 2018-12-19 ENCOUNTER — Other Ambulatory Visit: Payer: Self-pay

## 2018-12-19 ENCOUNTER — Inpatient Hospital Stay (HOSPITAL_BASED_OUTPATIENT_CLINIC_OR_DEPARTMENT_OTHER): Payer: Medicare Other | Admitting: Internal Medicine

## 2018-12-19 ENCOUNTER — Inpatient Hospital Stay: Payer: Medicare Other | Admitting: *Deleted

## 2018-12-19 DIAGNOSIS — C9200 Acute myeloblastic leukemia, not having achieved remission: Secondary | ICD-10-CM | POA: Diagnosis not present

## 2018-12-19 DIAGNOSIS — Z853 Personal history of malignant neoplasm of breast: Secondary | ICD-10-CM | POA: Diagnosis not present

## 2018-12-19 DIAGNOSIS — R634 Abnormal weight loss: Secondary | ICD-10-CM | POA: Diagnosis not present

## 2018-12-19 DIAGNOSIS — Z452 Encounter for adjustment and management of vascular access device: Secondary | ICD-10-CM

## 2018-12-19 DIAGNOSIS — Z79899 Other long term (current) drug therapy: Secondary | ICD-10-CM | POA: Diagnosis not present

## 2018-12-19 LAB — CBC WITH DIFFERENTIAL/PLATELET
Abs Immature Granulocytes: 0.02 10*3/uL (ref 0.00–0.07)
Basophils Absolute: 0 10*3/uL (ref 0.0–0.1)
Basophils Relative: 0 %
Eosinophils Absolute: 0 10*3/uL (ref 0.0–0.5)
Eosinophils Relative: 0 %
HCT: 25.4 % — ABNORMAL LOW (ref 36.0–46.0)
Hemoglobin: 8 g/dL — ABNORMAL LOW (ref 12.0–15.0)
Immature Granulocytes: 3 %
Lymphocytes Relative: 55 %
Lymphs Abs: 0.4 10*3/uL — ABNORMAL LOW (ref 0.7–4.0)
MCH: 24.7 pg — ABNORMAL LOW (ref 26.0–34.0)
MCHC: 31.5 g/dL (ref 30.0–36.0)
MCV: 78.4 fL — ABNORMAL LOW (ref 80.0–100.0)
Monocytes Absolute: 0.1 10*3/uL (ref 0.1–1.0)
Monocytes Relative: 14 %
Neutro Abs: 0.2 10*3/uL — ABNORMAL LOW (ref 1.7–7.7)
Neutrophils Relative %: 28 %
Platelets: 15 10*3/uL — CL (ref 150–400)
RBC: 3.24 MIL/uL — ABNORMAL LOW (ref 3.87–5.11)
RDW: 19.5 % — ABNORMAL HIGH (ref 11.5–15.5)
WBC: 0.7 10*3/uL — CL (ref 4.0–10.5)
nRBC: 16.9 % — ABNORMAL HIGH (ref 0.0–0.2)

## 2018-12-19 LAB — BASIC METABOLIC PANEL
Anion gap: 10 (ref 5–15)
BUN: 27 mg/dL — ABNORMAL HIGH (ref 8–23)
CO2: 25 mmol/L (ref 22–32)
Calcium: 9.3 mg/dL (ref 8.9–10.3)
Chloride: 102 mmol/L (ref 98–111)
Creatinine, Ser: 0.57 mg/dL (ref 0.44–1.00)
GFR calc Af Amer: >60 mL/min (ref >60)
GFR calc non Af Amer: >60 mL/min (ref >60)
Glucose, Bld: 130 mg/dL — ABNORMAL HIGH (ref 70–99)
Potassium: 3.7 mmol/L (ref 3.5–5.1)
Sodium: 137 mmol/L (ref 135–145)

## 2018-12-19 LAB — SAMPLE TO BLOOD BANK

## 2018-12-19 LAB — LACTATE DEHYDROGENASE: LDH: 295 U/L — ABNORMAL HIGH (ref 98–192)

## 2018-12-19 MED ORDER — POSACONAZOLE 100 MG PO TBEC: 200.0000 mg | DELAYED_RELEASE_TABLET | Freq: Every day | ORAL | 3 refills | Status: DC

## 2018-12-19 MED ORDER — SODIUM CHLORIDE 0.9% FLUSH
10.0000 mL | Freq: Once | INTRAVENOUS | Status: AC
  Administered 2018-12-19: 09:00:00 10 mL via INTRAVENOUS
  Filled 2018-12-19: qty 10

## 2018-12-19 NOTE — Progress Notes (Signed)
Patient brings in with a note that has a couple of questions from her brother.  Her brother, Dr. Frederick Peers, would like to recive a call (cell: [phone]) from Dr. B regarding these questions and an update.  She got her shipment of Venclexta yesterday and it does not have any more refills.

## 2018-12-19 NOTE — Progress Notes (Signed)
START OFF PATHWAY REGIMEN - Other   [id number]:Azacitidine 75 mg/m2 IV D1-5 q28 days:   A cycle is every 28 days:     Azacitidine   **Always confirm dose/schedule in your pharmacy ordering system**  Patient Characteristics: Intent of Therapy: Non-Curative / Palliative Intent, Discussed with Patient 

## 2018-12-19 NOTE — Assessment & Plan Note (Addendum)
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 Currently on Vidaza + Venatoclax   # Cycle 1; day-24 of -Vidaza-venatoclax. Hb 8 platelets 15;WBC- 0.7; absolute neutrophil count 0.2  LDH improving.  Patient will need dose reduction to 100 mg of venetoclax while on posaconazole.  #Discussed the possible etiologies of pancytopenia-Vidaza/venetoclax induced versus underlying acute residual leukemia.  Recommend bone marrow biopsy for further evaluation next week.  #Supportive care:  # TLS prophylaxis-continue allopurinol.  Discontinue allopurinol if needed.  #Antimicrobial prophylaxis-reminded the patient to start [prescribed at last viist] Levaquin 250 mg/daily; acyclovir 400 mg once a day.  Will initiate a prescription for posaconazole [265m/d]  -dose reduction for venetoclax will be recommended.  Discussed with pharmacy. # PICC dressing weekly.  # weight loss: Recommend boost/protein intake.  Referral to [facility]  #Discussed with patient's brother- Dr.Cardi/husband-at length regarding patient's plan of care/labs etc.  # DISPOSITION: print labs   # 7/13 cbc/hold tube- possible 1platelets/ 1unit PRBC; # Bone marrow Biopsy in asap/ COVID # follow up MD- July 17th-  cbc/hold tube- possible 1platelets/ 1unit PRBC

## 2018-12-19 NOTE — Telephone Encounter (Signed)
Contacted patient for bone marrow biopsy date. bx scheduled for Tuesday 12/24/2018 with an arrival of 730 am.  Patient made aware that she should be NPO after midnight with the exception for routine bp meds as directed. Also, per [place] in specials, pt does not need any additional covid testing-pre procedure.  Pt aware and agreeable. Husband inquired if patient should be taking levaquin as this script was was on patient's AVS. Husband stated that pt is not taking this script.   Spoke with Dr. Rogue Bussing - per md "I called in the prescription for levaquin to her pharmacy at last visit. She need to be on levaquin 250 mg/day"  Spoke with patient's husband, who stated, "he will go pick it up. he states that they pharmacy never told him that it was ready." he will go now and pick up the script

## 2018-12-19 NOTE — Progress Notes (Signed)
[facility] CONSULT NOTE  Patient Care Team: Rusty Aus, MD as PCP - General (Internal Medicine)  CHIEF COMPLAINTS/PURPOSE OF CONSULTATION:    Oncology History Overview Note  # SEP 2017- MYELOPROLIFERATIVE NEOPLASM- [WBC- 36; normal Hb/platelets] hypercellular bone marrow 90-95% proliferation of myeloid cells in various stages of maturation; relative erythroid hypoplasia and proliferation of atypical megakaryocytes; no increase in blasts; peripheral blood Bcr-Abl-NEG; Cytogenetics- WNL. NEG- Jak-2/MPL/CALR [place] limited- mild splenomegaly [~10cm; 463cm3]; OCT 2017- second opinion at [place]. Surveillance.   # With progressive leukocytosis we evaluated her a month ago and sent BM for exam. She has a progressive leukocytosis so hydrea '500mg'$  daily was started on 10/20/2016 and increased to 2gm daily then back down to one daily, then 5 days per week over time due to counts drop. Then we held her hydrea since 04/09/2018 due to continued declining Hb and Plt.s  BMB 06/24/18 showed markedly hypercellular BM 95% with myeloid hyperplasia and atypical megakaryocytic hyperplasia. No significant increase in blasts. 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 BCR / ABL negative, FISH normal. Flow Showed 2%CD34-positive myeloid blasts. Myeloid precursors with low side scatter. Pathogenic variants were detected in the ASXL1, CCND2, CUX1, and U2AF1 genes.  # March 2020- wbc- 14/ Hb 9.5/ platelets- 54.   ---------------------------------------------------   # June 8th 2020- Acute myeloid leukemia [peripheral blood flow cytometry;NGS- pending]- June 15th Vidaza [SQ 1-5 + Venatoclax- #1in pt]; tumor lysis prophylaxis    # 2002 [[place]] BREAST CA s/p Lumpect RT; [? Stage I] no chemo s/p AI  DIAGNOSIS: ACUTE MYELOID LEUKEMIA GOALS: pallaitive  CURRENT/MOST RECENT THERAPY : Vidaza+ venotoclax.      MDS (myelodysplastic syndrome)  ([place]) (Resolved)  08/28/2018 Initial Diagnosis   MDS (myelodysplastic syndrome) (HCC)   AML (acute myeloid leukemia) with failed remission ([place])  11/25/2018 Initial Diagnosis   AML (acute myeloid leukemia) with failed remission ([place])   11/25/2018 - 12/22/2018 Chemotherapy   The patient had azaCITIDine (VIDAZA) chemo injection 105 mg, 75 mg/m2 = 105 mg, Subcutaneous,  Once, 1 of 4 cycles Administration: 105 mg (11/25/2018), 105 mg (11/26/2018), 105 mg (11/27/2018), 105 mg (11/28/2018), 105 mg (11/29/2018)  for chemotherapy treatment.    12/30/2018 -  Chemotherapy   The patient had PALONOSETRON HCL INJECTION 0.25 MG/5ML, 0.25 mg, Intravenous,  Once, 0 of 4 cycles azaCITIDine (VIDAZA) 100 mg in sodium chloride 0.9 % 50 mL chemo infusion, 75 mg/m2, Intravenous, Once, 0 of 4 cycles  for chemotherapy treatment.       HISTORY OF PRESENTING ILLNESS:  Kathryn Hahn 85 y.o.  female with acute myeloid leukemia with monocytic differentiation currently on Vidaza plus venetoclax is here for follow-up.  Patient needing PRBC/platelet transfusion approximately once a week.   Complains of fatigue.  Complains of poor appetite.  Mild weight loss.  No nausea no vomiting no fevers.  Continues to have easy bruising but no nosebleeds or gum bleeding.  No bleeding around the site.  Review of Systems  Constitutional: Positive for malaise/fatigue. Negative for chills, diaphoresis, fever and weight loss.  HENT: Negative for nosebleeds and sore throat.   Eyes: Negative for double vision.  Respiratory: Positive for shortness of breath. Negative for cough, hemoptysis, sputum production and wheezing.   Cardiovascular: Positive for leg swelling. Negative for chest pain, palpitations and orthopnea.  Gastrointestinal: Negative for abdominal pain, blood in stool, constipation, diarrhea, heartburn, melena, nausea and vomiting.  Genitourinary: Negative for dysuria, frequency and urgency.  Musculoskeletal: Negative for back  pain and joint pain.  Skin: Negative.  Negative for itching and rash.  Neurological: Negative for dizziness, tingling, focal weakness, weakness and headaches.  Endo/Heme/Allergies: Bruises/bleeds easily.  Psychiatric/Behavioral: Negative for depression. The patient is not nervous/anxious and does not have insomnia.        MEDICAL HISTORY:  Past Medical History:  Diagnosis Date  . Anemia   . Arthritis   . Breast cancer ([place]) 2003   RT LUMPECTOMY  . Edema leg   . History of breast cancer 2003   post lumpectomy  . History of kidney stones   . Hyperlipemia, mixed   . Hypertension, essential   . Leukemia ([place])   . Leukocytosis   . MDS (myelodysplastic syndrome) ([place])   . Personal history of radiation therapy 2003   BREAST CA  . Renal stones   . Vitamin D deficiency     SURGICAL HISTORY: Past Surgical History:  Procedure Laterality Date  . BREAST BIOPSY Right 2003   Postive for cancer  . BREAST LUMPECTOMY Right 2003   BREAST CA  . KIDNEY STONE SURGERY Right     SOCIAL HISTORY: Social History   Socioeconomic History  . Marital status: Married    Spouse name: Not on file  . Number of children: Not on file  . Years of education: Not on file  . Highest education level: Not on file  Occupational History  . Not on file  Social Needs  . Financial resource strain: Not on file  . Food insecurity    Worry: Not on file    Inability: Not on file  . Transportation needs    Medical: Not on file    Non-medical: Not on file  Tobacco Use  . Smoking status: Never Smoker  . Smokeless tobacco: Never Used  Substance and Sexual Activity  . Alcohol use: Yes    Alcohol/week: 3.0 standard drinks    Types: 3 Glasses of wine per week  . Drug use: No  . Sexual activity: Not on file  Lifestyle  . Physical activity    Days per week: Not on file    Minutes per session: Not on file  . Stress: Not on file  Relationships  . Social Herbalist on phone: Not on file     Gets together: Not on file    Attends religious service: Not on file    Active member of club or organization: Not on file    Attends meetings of clubs or organizations: Not on file    Relationship status: Not on file  . Intimate partner violence    Fear of current or ex partner: Not on file    Emotionally abused: Not on file    Physically abused: Not on file    Forced sexual activity: Not on file  Other Topics Concern  . Not on file  Social History Narrative    No smoking/alcohol; lives in [place] with family.  She lives in assisted living with her husband.    FAMILY HISTORY: Family History  Problem Relation Age of Onset  . Stroke Mother   . Hypertension Father   . Stroke Father   . Breast cancer Neg Hx     ALLERGIES:  is allergic to terbinafine.  MEDICATIONS:  Current Outpatient Medications  Medication Sig Dispense Refill  . acyclovir (ZOVIRAX) 400 MG tablet One pill a day [to prevent shingles] 30 tablet 3  . allopurinol (ZYLOPRIM) 300 MG tablet Take 1 tablet (300 mg total)  by mouth 2 (two) times daily. 120 tablet 0  . atenolol (TENORMIN) 50 MG tablet Take 1 tablet by mouth 2 (two) times daily.    Marland Kitchen CALCIUM-VITAMIN D PO Take 1 tablet by mouth daily.     . Cholecalciferol (VITAMIN D) 2000 units tablet Take 1 tablet by mouth daily.    . iron polysaccharides (FERREX 150) 150 MG capsule Take 1 capsule (150 mg total) by mouth daily. (Patient taking differently: Take 150 mg by mouth 2 (two) times daily. ) 90 capsule 1  . levofloxacin (LEVAQUIN) 250 MG tablet Take 1 tablet (250 mg total) by mouth daily. 30 tablet 3  . losartan-hydrochlorothiazide (HYZAAR) 50-12.5 MG tablet Take 1 tablet by mouth daily.    . magic mouthwash w/lidocaine SOLN Take 5 mLs by mouth 4 (four) times daily as needed for mouth pain. 480 mL 3  . Omega-3 Fatty Acids (FISH OIL PO) Take 1 capsule by mouth daily.    . potassium chloride SA (K-DUR) 20 MEQ tablet 1 pill twice a day 30 tablet 3  . VENCLEXTA 100  MG TABS TAKE 4 TABLETS BY MOUTH DAILY AS DIRECTED 120 tablet 0  . vitamin C (ASCORBIC ACID) 500 MG tablet Take 1 tablet by mouth 2 (two) times daily.     No current facility-administered medications for this visit.       Marland Kitchen  PHYSICAL EXAMINATION: ECOG PERFORMANCE STATUS: 0 - Asymptomatic  Vitals:   12/19/18 0939  BP: (!) 161/77  Pulse: 66  Resp: 16  Temp: (!) 96.9 F (36.1 C)   Filed Weights   12/19/18 0939  Weight: 96 lb 14.4 oz (44 kg)    Physical Exam  Constitutional: She is oriented to person, place, and time and well-developed, well-nourished, and in no distress.  HENT:  Head: Normocephalic and atraumatic.  Mouth/Throat: Oropharynx is clear and moist. No oropharyngeal exudate.  Eyes: Pupils are equal, round, and reactive to light.  Neck: Normal range of motion. Neck supple.  Cardiovascular: Normal rate and regular rhythm.  Pulmonary/Chest: No respiratory distress. She has no wheezes.  Abdominal: Soft. Bowel sounds are normal. She exhibits no distension and no mass. There is no abdominal tenderness. There is no rebound and no guarding.  Musculoskeletal: Normal range of motion.        General: Edema present. No tenderness.  Neurological: She is alert and oriented to person, place, and time.  Skin: Skin is warm.  Multiple bruises noted.  Positive for mild bleeding around the site of the PICC line insertion.  Multiple abdominal wall bruises noted.  Psychiatric: Affect normal.  ;   LABORATORY DATA:  I have reviewed the data as listed Lab Results  Component Value Date   WBC 0.7 (LL) 12/19/2018   HGB 8.0 (L) 12/19/2018   HCT 25.4 (L) 12/19/2018   MCV 78.4 (L) 12/19/2018   PLT 15 (LL) 12/19/2018   Recent Labs    11/20/18 0814 11/25/18 0904 11/25/18 1219  12/12/18 1123 12/17/18 0855 12/19/18 0919  NA 140 137 138   < > 135 136 137  K 2.9* 3.7 3.8   < > 4.0 4.0 3.7  CL 99 99 101   < > 99 101 102  CO2 '31 27 30   '$ < > '26 26 25  '$ GLUCOSE 130* 159* 110*   < >  152* 132* 130*  BUN 24* 24* 23   < > 28* 33* 27*  CREATININE 0.90 0.90 0.88   < > 0.69 0.83 0.57  CALCIUM 10.3 10.1 10.5*   < > 9.1 9.1 9.3  GFRNONAA 60* 60* >60   < > >60 >60 >60  GFRAA >60 >60 >60   < > >60 >60 >60  PROT 7.1 7.0 7.4  --   --   --   --   ALBUMIN 4.0 4.1 4.3  --   --   --   --   AST 39 37 39  --   --   --   --   ALT '14 13 13  '$ --   --   --   --   ALKPHOS 65 62 66  --   --   --   --   BILITOT 0.7 0.5 0.9  --   --   --   --    < > = values in this interval not displayed.    RADIOGRAPHIC STUDIES: I have personally reviewed the radiological images as listed and agreed with the findings in the report. US Venous Img Upper Uni Left  Result Date: 12/03/2018 CLINICAL DATA:  Indwelling left upper extremity PICC line. Left upper extremity edema. EXAM: LEFT UPPER EXTREMITY VENOUS DOPPLER ULTRASOUND TECHNIQUE: Gray-scale sonography with graded compression, as well as color Doppler and duplex ultrasound were performed to evaluate the upper extremity deep venous system from the level of the subclavian vein and including the jugular, axillary, basilic, radial, ulnar and upper cephalic vein. Spectral Doppler was utilized to evaluate flow at rest and with distal augmentation maneuvers. COMPARISON:  None. FINDINGS: Contralateral Subclavian Vein: Respiratory phasicity is normal and symmetric with the symptomatic side. No evidence of thrombus. Normal compressibility. Internal Jugular Vein: No evidence of thrombus. Normal compressibility, respiratory phasicity and response to augmentation. Subclavian Vein: No evidence of thrombus. Visible PICC line without surrounding thrombus. Normal compressibility, respiratory phasicity and response to augmentation. Axillary Vein: No evidence of thrombus. Visible PICC line without surrounding thrombus. Normal compressibility, respiratory phasicity and response to augmentation. Cephalic Vein: No evidence of thrombus. Normal compressibility, respiratory phasicity and  response to augmentation. Basilic Vein: No evidence of thrombus. PICC line visible extending in the left basilic vein without thrombus. Normal compressibility, respiratory phasicity and response to augmentation. Brachial Veins: No evidence of thrombus. Normal compressibility, respiratory phasicity and response to augmentation. Radial Veins: No evidence of thrombus. Normal compressibility, respiratory phasicity and response to augmentation. Ulnar Veins: No evidence of thrombus. Normal compressibility, respiratory phasicity and response to augmentation. Venous Reflux:  None visualized. Other Findings: No evidence of superficial thrombophlebitis or abnormal fluid collection. IMPRESSION: No evidence of DVT within the left upper extremity. No visible thrombus surrounding an indwelling PICC line. Electronically Signed   By: Aletta Edouard M.D.   On: 12/03/2018 16:00   [place] Ekg Site Rite  Result Date: 12/02/2018 If Site Rite image not attached, placement could not be confirmed due to current cardiac rhythm.   ASSESSMENT & PLAN:   AML (acute myeloid leukemia) with failed remission (HCC) #Acute myeloid leukemia with monocytic differentiation [20% blasts on peripheral blood flow cytometry]. Currently on Vidaza + Venatoclax   # Cycle 1; day-24 of -Vidaza-venatoclax. Hb 8 platelets 15;WBC- 0.7; absolute neutrophil count 0.2  LDH improving.   #Discussed the possible etiologies of pancytopenia-Vidaza/venetoclax induced versus underlying acute residual leukemia.  Recommend bone marrow biopsy for further evaluation next week.  #Supportive care:  # TLS prophylaxis-continue allopurinol.  Discontinue allopurinol if needed.  #Antimicrobial prophylaxis-reminded the patient to start [prescribed at last viist] Levaquin 250 mg/daily; acyclovir 400 mg once a  day.  Will initiate a prescription for posaconazole-dose reduction for venetoclax will be recommended.  Discussed with pharmacy. # PICC dressing weekly.  # weight  loss: Recommend boost/protein intake.  Referral to [facility].  #Discussed with patient's brother- Dr.Cardi/husband-at length regarding patient's plan of care/labs etc.  # DISPOSITION: print labs   # 7/13 cbc/hold tube- possible 1platelets/ 1unit PRBC; # Bone marrow Biopsy in asap/ COVID # follow up MD- July 17th-  cbc/hold tube- possible 1platelets/ 1unit PRBC       Cammie Sickle, MD 12/19/2018 8:24 PM

## 2018-12-20 ENCOUNTER — Ambulatory Visit
Admission: RE | Admit: 2018-12-20 | Discharge: 2018-12-20 | Disposition: A | Discharge status: Home / Self Care | Payer: Medicare Other | Source: Ambulatory Visit | Attending: Internal Medicine | Admitting: Internal Medicine

## 2018-12-20 ENCOUNTER — Telehealth: Payer: Self-pay | Admitting: Internal Medicine

## 2018-12-20 ENCOUNTER — Telehealth: Payer: Self-pay

## 2018-12-20 DIAGNOSIS — D469 Myelodysplastic syndrome, unspecified: Secondary | ICD-10-CM

## 2018-12-20 DIAGNOSIS — Z1231 Encounter for screening mammogram for malignant neoplasm of breast: Secondary | ICD-10-CM | POA: Diagnosis present

## 2018-12-20 MED ORDER — FLUCONAZOLE 200 MG PO TABS: 200.0000 mg | ORAL_TABLET | Freq: Every day | ORAL | 0 refills | Status: DC

## 2018-12-20 MED ORDER — VENCLEXTA 100 MG PO TABS: 200.0000 mg | ORAL_TABLET | Freq: Every day | ORAL | 0 refills | Status: DC

## 2018-12-20 NOTE — Telephone Encounter (Signed)
Nutrition Assessment:  Reason for referral:  Weight loss  85 year old female with acute myeloid leukemia.  Past medical history of breast cancer in 2002, HLD, HTN, MDS, vit d deficency.  Patient followed by Dr. B and currently on vidaza-venatoclax.    Spoke with patient via phone this am.  Patient reports that she tries to eat 3 meals per day with snacks.  Recently husband bought ensure high protein (160 calories) and she drank one yesterday.     Medications: calcium -vit D, omega 3, magic mouthwash, KCL, VIt C  Labs: glucose 130  Anthropometrics:   Height: 4 ft 9 inches Weight: 96 lb 14. 4 oz on 7/9 Noted 101 lb on 08/28/2018 BMI: 20  5% weight loss in the last 4 months   Estimated Energy Needs  Kcals: 1320-1540 calories Protein: 66-77 g Fluid: 1.5 L  NUTRITION DIAGNOSIS: Unintentional weight loss related to cancer and cancer related treatment side effects as evidenced by 5% weight loss in the last 4 months.    INTERVENTION:  Briefly discussed strategies to increase calories and protein. Discussed higher calories shake options.  Will provide samples to patient on 7/13 visit. Patient agreeable Contact information provided    MONITORING, EVALUATION, GOAL: Patient will consume adequate calories and protein to prevent further weight loss.    NEXT VISIT: Monday, July 13   Jaliza Seifried B. Zenia Resides, [place], [place] Registered Dietitian [phone] (pager)

## 2018-12-20 NOTE — Telephone Encounter (Signed)
Spoke to pt pt/husband-recommend pickup for Levaquin/acyclovir; also send a prescription for Diflucan 200 mg a day.   #  Stressed the importance of taking only 200 milligrams of venetoclax [2 instead of 4 pills of the venetoclax]  #Patient/husband expressed understanding.  #[facility] will also reach out to them- to reiterate the above recommendations.  Follow-up as planned

## 2018-12-20 NOTE — Telephone Encounter (Addendum)
Oral Chemotherapy Pharmacist Encounter   Spoke to Kathryn Hahn and he stated they picked up the new medications today. Confirmed that they had fluconazole, levofloxacin, and acyclovir at home now. He was able to telling me about the change of venetoclax from 4 tablets (400mg ) to 2 tablet (200mg ) without me having to prompt him. They know to make the dose change with her next planned dose tomorrow.   They know they can call me with any questions.  Kathryn Hahn, PharmD, BCPS, [facility] Hematology/Oncology Clinical Pharmacist [facility]/HP/AP [facility] [phone]  12/20/2018 4:17 PM

## 2018-12-20 NOTE — Addendum Note (Signed)
Addended by: Darl Pikes on: 12/20/2018 04:28 PM   Modules accepted: Orders

## 2018-12-23 ENCOUNTER — Telehealth: Payer: Self-pay | Admitting: Pharmacy Technician

## 2018-12-23 ENCOUNTER — Inpatient Hospital Stay: Payer: Medicare Other | Admitting: *Deleted

## 2018-12-23 ENCOUNTER — Other Ambulatory Visit: Payer: Self-pay

## 2018-12-23 ENCOUNTER — Other Ambulatory Visit: Payer: Self-pay | Admitting: *Deleted

## 2018-12-23 ENCOUNTER — Other Ambulatory Visit: Payer: Self-pay | Admitting: Student

## 2018-12-23 ENCOUNTER — Inpatient Hospital Stay: Payer: Medicare Other

## 2018-12-23 DIAGNOSIS — C9200 Acute myeloblastic leukemia, not having achieved remission: Secondary | ICD-10-CM

## 2018-12-23 DIAGNOSIS — Z452 Encounter for adjustment and management of vascular access device: Secondary | ICD-10-CM

## 2018-12-23 LAB — PREPARE RBC (CROSSMATCH)

## 2018-12-23 LAB — CBC WITH DIFFERENTIAL/PLATELET
Abs Immature Granulocytes: 0.03 10*3/uL (ref 0.00–0.07)
Basophils Absolute: 0 10*3/uL (ref 0.0–0.1)
Basophils Relative: 0 %
Eosinophils Absolute: 0 10*3/uL (ref 0.0–0.5)
Eosinophils Relative: 0 %
HCT: 24.8 % — ABNORMAL LOW (ref 36.0–46.0)
Hemoglobin: 7.8 g/dL — ABNORMAL LOW (ref 12.0–15.0)
Immature Granulocytes: 4 %
Lymphocytes Relative: 58 %
Lymphs Abs: 0.4 10*3/uL — ABNORMAL LOW (ref 0.7–4.0)
MCH: 24.5 pg — ABNORMAL LOW (ref 26.0–34.0)
MCHC: 31.5 g/dL (ref 30.0–36.0)
MCV: 78 fL — ABNORMAL LOW (ref 80.0–100.0)
Monocytes Absolute: 0.1 10*3/uL (ref 0.1–1.0)
Monocytes Relative: 11 %
Neutro Abs: 0.2 10*3/uL — ABNORMAL LOW (ref 1.7–7.7)
Neutrophils Relative %: 27 %
Platelets: 8 10*3/uL — CL (ref 150–400)
RBC: 3.18 MIL/uL — ABNORMAL LOW (ref 3.87–5.11)
RDW: 20 % — ABNORMAL HIGH (ref 11.5–15.5)
Smear Review: DECREASED
WBC: 0.8 10*3/uL — CL (ref 4.0–10.5)
nRBC: 13.3 % — ABNORMAL HIGH (ref 0.0–0.2)

## 2018-12-23 LAB — SAMPLE TO BLOOD BANK

## 2018-12-23 MED ORDER — ACETAMINOPHEN 325 MG PO TABS
650.0000 mg | ORAL_TABLET | Freq: Once | ORAL | Status: AC
  Administered 2018-12-23: 650 mg via ORAL
  Filled 2018-12-23: qty 2

## 2018-12-23 MED ORDER — SODIUM CHLORIDE 0.9% FLUSH
10.0000 mL | Freq: Once | INTRAVENOUS | Status: AC
  Administered 2018-12-23: 09:00:00 10 mL via INTRAVENOUS
  Filled 2018-12-23: qty 10

## 2018-12-23 MED ORDER — DIPHENHYDRAMINE HCL 25 MG PO CAPS
25.0000 mg | ORAL_CAPSULE | Freq: Once | ORAL | Status: AC
  Administered 2018-12-23: 25 mg via ORAL
  Filled 2018-12-23: qty 1

## 2018-12-23 MED ORDER — HEPARIN SOD (PORK) LOCK FLUSH 100 UNIT/ML IV SOLN
500.0000 [IU] | Freq: Once | INTRAVENOUS | Status: AC
  Administered 2018-12-23: 500 [IU] via INTRAVENOUS
  Filled 2018-12-23: qty 5

## 2018-12-23 MED ORDER — SODIUM CHLORIDE 0.9% FLUSH
10.0000 mL | INTRAVENOUS | Status: DC | PRN
  Filled 2018-12-23: qty 10

## 2018-12-23 MED ORDER — SODIUM CHLORIDE 0.9% IV SOLUTION
250.0000 mL | Freq: Once | INTRAVENOUS | Status: AC
  Administered 2018-12-23: 250 mL via INTRAVENOUS
  Filled 2018-12-23: qty 250

## 2018-12-23 NOTE — Progress Notes (Signed)
Nutrition  Met with patient in infusion this am.    Provided samples of higher calorie nutrition shakes, coupons.   Handout provided as well discussing ways to increase calories and protein  Next visit: August 3rd via phone  Jabre Heo B. Zenia Resides, [place], [place] Registered Dietitian [phone] (pager)

## 2018-12-23 NOTE — Telephone Encounter (Signed)
Oral Oncology Patient Advocate Encounter  Met patient during infusion to complete application for [employer] PAP in an effort to reduce patient's out of pocket expense for Noxafil (posaconazole) to $0.    Application completed and faxed to [phone].   [employer] patient assistance phone number for follow up is [phone].   This encounter will be updated until final determination.   [place] Patient [place] Phone [phone] Fax [phone] 12/23/2018 3:58 PM

## 2018-12-23 NOTE — Telephone Encounter (Signed)
Oral Oncology Patient Advocate Encounter  Received a call from [employer] Patient Assistance program that patient has been successfully enrolled into their program to receive Noxafil (posaconazole) from the manufacturer at $0 out of pocket until 06/12/2019.    I called and spoke with patient.  I told her that [employer] will be shipping out the medication to her home within 5 business days.  Patient knows to call the office with questions or concerns.   Oral Oncology Clinic will continue to follow.  [employer] Patient [place] Phone [phone] Fax [phone] 12/23/2018 3:59 PM

## 2018-12-24 ENCOUNTER — Other Ambulatory Visit: Payer: Self-pay

## 2018-12-24 ENCOUNTER — Ambulatory Visit
Admission: RE | Admit: 2018-12-24 | Discharge: 2018-12-24 | Disposition: A | Discharge status: Home / Self Care | Payer: Medicare Other | Source: Ambulatory Visit | Attending: Internal Medicine | Admitting: Internal Medicine

## 2018-12-24 ENCOUNTER — Other Ambulatory Visit (HOSPITAL_COMMUNITY)
Admission: RE | Admit: 2018-12-24 | Disposition: A | Discharge status: Home / Self Care | Payer: PRIVATE HEALTH INSURANCE | Source: Ambulatory Visit | Attending: Internal Medicine | Admitting: Internal Medicine

## 2018-12-24 DIAGNOSIS — C9200 Acute myeloblastic leukemia, not having achieved remission: Secondary | ICD-10-CM

## 2018-12-24 DIAGNOSIS — D61818 Other pancytopenia: Secondary | ICD-10-CM | POA: Diagnosis not present

## 2018-12-24 LAB — PREPARE PLATELET PHERESIS: Unit division: 0

## 2018-12-24 LAB — CBC WITH DIFFERENTIAL/PLATELET
Abs Immature Granulocytes: 0.01 10*3/uL (ref 0.00–0.07)
Basophils Absolute: 0 10*3/uL (ref 0.0–0.1)
Basophils Relative: 0 %
Eosinophils Absolute: 0 10*3/uL (ref 0.0–0.5)
Eosinophils Relative: 0 %
HCT: 27.7 % — ABNORMAL LOW (ref 36.0–46.0)
Hemoglobin: 8.7 g/dL — ABNORMAL LOW (ref 12.0–15.0)
Immature Granulocytes: 2 %
Lymphocytes Relative: 59 %
Lymphs Abs: 0.4 10*3/uL — ABNORMAL LOW (ref 0.7–4.0)
MCH: 25 pg — ABNORMAL LOW (ref 26.0–34.0)
MCHC: 31.4 g/dL (ref 30.0–36.0)
MCV: 79.6 fL — ABNORMAL LOW (ref 80.0–100.0)
Monocytes Absolute: 0.1 10*3/uL (ref 0.1–1.0)
Monocytes Relative: 11 %
Neutro Abs: 0.2 10*3/uL — ABNORMAL LOW (ref 1.7–7.7)
Neutrophils Relative %: 28 %
Platelets: 12 10*3/uL — CL (ref 150–400)
RBC: 3.48 MIL/uL — ABNORMAL LOW (ref 3.87–5.11)
RDW: 19.6 % — ABNORMAL HIGH (ref 11.5–15.5)
WBC: 0.7 10*3/uL — CL (ref 4.0–10.5)
nRBC: 13.8 % — ABNORMAL HIGH (ref 0.0–0.2)

## 2018-12-24 LAB — BPAM PLATELET PHERESIS
Blood Product Expiration Date: 202007162359
ISSUE DATE / TIME: 202007131054
Unit Type and Rh: 6200

## 2018-12-24 LAB — TYPE AND SCREEN
ABO/RH(D): O POS
Antibody Screen: NEGATIVE
Unit division: 0

## 2018-12-24 LAB — BPAM RBC
Blood Product Expiration Date: 202008052359
ISSUE DATE / TIME: 202007131208
Unit Type and Rh: 5100

## 2018-12-24 LAB — PROTIME-INR
INR: 1.1 (ref 0.8–1.2)
Prothrombin Time: 14.2 seconds (ref 11.4–15.2)

## 2018-12-24 MED ORDER — MIDAZOLAM HCL 2 MG/2ML IJ SOLN
INTRAMUSCULAR | Status: AC | PRN
  Administered 2018-12-24: 1 mg via INTRAVENOUS

## 2018-12-24 MED ORDER — SODIUM CHLORIDE 0.9 % IV SOLN
INTRAVENOUS | Status: DC
  Administered 2018-12-24: 08:00:00 via INTRAVENOUS

## 2018-12-24 MED ORDER — FENTANYL CITRATE (PF) 100 MCG/2ML IJ SOLN
INTRAMUSCULAR | Status: AC | PRN
  Administered 2018-12-24: 25 ug via INTRAVENOUS

## 2018-12-24 MED ORDER — HEPARIN SOD (PORK) LOCK FLUSH 100 UNIT/ML IV SOLN
500.0000 [IU] | Freq: Once | INTRAVENOUS | Status: AC
  Administered 2018-12-24: 500 [IU] via INTRAVENOUS
  Filled 2018-12-24: qty 5

## 2018-12-24 MED ORDER — HYDROCODONE-ACETAMINOPHEN 5-325 MG PO TABS
1.0000 | ORAL_TABLET | ORAL | Status: DC | PRN
  Filled 2018-12-24: qty 2

## 2018-12-24 MED ORDER — HEPARIN SOD (PORK) LOCK FLUSH 100 UNIT/ML IV SOLN
INTRAVENOUS | Status: AC
  Filled 2018-12-24: qty 5

## 2018-12-24 MED ORDER — BUPIVACAINE HCL (PF) 0.25 % IJ SOLN
INTRAMUSCULAR | Status: AC
  Filled 2018-12-24: qty 30

## 2018-12-24 MED ORDER — FENTANYL CITRATE (PF) 100 MCG/2ML IJ SOLN
INTRAMUSCULAR | Status: AC
  Filled 2018-12-24: qty 2

## 2018-12-24 MED ORDER — MIDAZOLAM HCL 2 MG/2ML IJ SOLN
INTRAMUSCULAR | Status: AC
  Filled 2018-12-24: qty 2

## 2018-12-24 MED ORDER — BUPIVACAINE HCL 0.25 % IJ SOLN
INTRAMUSCULAR | Status: AC | PRN
  Administered 2018-12-24: 3.5 mL

## 2018-12-24 NOTE — Discharge Instructions (Signed)
Moderate Conscious Sedation, Adult, Care After °These instructions provide you with information about caring for yourself after your procedure. Your health care provider may also give you more specific instructions. Your treatment has been planned according to current medical practices, but problems sometimes occur. Call your health care provider if you have any problems or questions after your procedure. °What can I expect after the procedure? °After your procedure, it is common: °· To feel sleepy for several hours. °· To feel clumsy and have poor balance for several hours. °· To have poor judgment for several hours. °· To vomit if you eat too soon. °Follow these instructions at home: °For at least 24 hours after the procedure: ° °· Do not: °? Participate in activities where you could fall or become injured. °? Drive. °? Use heavy machinery. °? Drink alcohol. °? Take sleeping pills or medicines that cause drowsiness. °? Make important decisions or sign legal documents. °? Take care of children on your own. °· Rest. °Eating and drinking °· Follow the diet recommended by your health care provider. °· If you vomit: °? Drink water, juice, or soup when you can drink without vomiting. °? Make sure you have little or no nausea before eating solid foods. °General instructions °· Have a responsible adult stay with you until you are awake and alert. °· Take over-the-counter and prescription medicines only as told by your health care provider. °· If you smoke, do not smoke without supervision. °· Keep all follow-up visits as told by your health care provider. This is important. °Contact a health care provider if: °· You keep feeling nauseous or you keep vomiting. °· You feel light-headed. °· You develop a rash. °· You have a fever. °Get help right away if: °· You have trouble breathing. °This information is not intended to replace advice given to you by your health care provider. Make sure you discuss any questions you have  with your health care provider. °Document Released: 03/19/2013 Document Revised: 05/11/2017 Document Reviewed: 09/18/2015 °Elsevier Patient Education © 2020 Else[employer] Inc. ° ° °Bone Marrow Aspiration and Bone Marrow Biopsy, Adult, Care After °This sheet gives you information about how to care for yourself after your procedure. Your health care provider may also give you more specific instructions. If you have problems or questions, contact your health care provider. °What can I expect after the procedure? °After the procedure, it is common to have: °· Mild pain and tenderness. °· Swelling. °· Bruising. °Follow these instructions at home: °Puncture site care ° °  ° °· Follow instructions from your health care provider about how to take care of the puncture site. Make sure you: °? Wash your hands with soap and water before you change your bandage (dressing). If soap and water are not available, use hand sanitizer. °? Change your dressing as told by your health care provider. °· Check your puncture site every day for signs of infection. Check for: °? More redness, swelling, or pain. °? More fluid or blood. °? Warmth. °? Pus or a bad smell. °General instructions °· Take over-the-counter and prescription medicines only as told by your health care provider. °· Do not take baths, swim, or use a hot tub until your health care provider approves. Ask if you can take a shower or have a sponge bath. °· Return to your normal activities as told by your health care provider. Ask your health care provider what activities are safe for you. °· Do not drive for 24 hours if you were   given a medicine to help you relax (sedative) during your procedure. °· Keep all follow-up visits as told by your health care provider. This is important. °Contact a health care provider if: °· Your pain is not controlled with medicine. °Get help right away if: °· You have a fever. °· You have more redness, swelling, or pain around the puncture site. °· You  have more fluid or blood coming from the puncture site. °· Your puncture site feels warm to the touch. °· You have pus or a bad smell coming from the puncture site. °These symptoms may represent a serious problem that is an emergency. Do not wait to see if the symptoms will go away. Get medical help right away. Call your local emergency services (911 in the [place] Do not drive yourself to the hospital. °Summary °· After the procedure, it is common to have mild pain, tenderness, swelling, and bruising. °· Follow instructions from your health care provider about how to take care of the puncture site. °· Get help right away if you have any symptoms of infection or if you have more blood or fluid coming from the puncture site. °This information is not intended to replace advice given to you by your health care provider. Make sure you discuss any questions you have with your health care provider. °Document Released: 12/16/2004 Document Revised: 09/11/2017 Document Reviewed: 11/10/2015 °Elsevier Patient Education © 2020 Elsevier Inc. ° ° °

## 2018-12-24 NOTE — Procedures (Signed)
CT bone marrow biopsy without difficulty  Complications:  None  Blood Loss: none  See dictation in canopy pacs  

## 2018-12-26 ENCOUNTER — Other Ambulatory Visit: Payer: Self-pay

## 2018-12-27 ENCOUNTER — Other Ambulatory Visit: Payer: Self-pay

## 2018-12-27 ENCOUNTER — Other Ambulatory Visit: Payer: Self-pay | Admitting: *Deleted

## 2018-12-27 ENCOUNTER — Inpatient Hospital Stay (HOSPITAL_BASED_OUTPATIENT_CLINIC_OR_DEPARTMENT_OTHER): Payer: Medicare Other | Admitting: Internal Medicine

## 2018-12-27 ENCOUNTER — Inpatient Hospital Stay: Payer: Medicare Other | Admitting: *Deleted

## 2018-12-27 ENCOUNTER — Inpatient Hospital Stay: Payer: Medicare Other

## 2018-12-27 DIAGNOSIS — C9200 Acute myeloblastic leukemia, not having achieved remission: Secondary | ICD-10-CM

## 2018-12-27 DIAGNOSIS — Z853 Personal history of malignant neoplasm of breast: Secondary | ICD-10-CM | POA: Diagnosis not present

## 2018-12-27 DIAGNOSIS — D702 Other drug-induced agranulocytosis: Secondary | ICD-10-CM | POA: Diagnosis not present

## 2018-12-27 DIAGNOSIS — R634 Abnormal weight loss: Secondary | ICD-10-CM

## 2018-12-27 DIAGNOSIS — Z452 Encounter for adjustment and management of vascular access device: Secondary | ICD-10-CM

## 2018-12-27 DIAGNOSIS — Z79899 Other long term (current) drug therapy: Secondary | ICD-10-CM

## 2018-12-27 LAB — CBC WITH DIFFERENTIAL/PLATELET
Abs Immature Granulocytes: 0.01 10*3/uL (ref 0.00–0.07)
Basophils Absolute: 0 10*3/uL (ref 0.0–0.1)
Basophils Relative: 0 %
Eosinophils Absolute: 0 10*3/uL (ref 0.0–0.5)
Eosinophils Relative: 0 %
HCT: 27.4 % — ABNORMAL LOW (ref 36.0–46.0)
Hemoglobin: 8.6 g/dL — ABNORMAL LOW (ref 12.0–15.0)
Immature Granulocytes: 1 %
Lymphocytes Relative: 50 %
Lymphs Abs: 0.5 10*3/uL — ABNORMAL LOW (ref 0.7–4.0)
MCH: 24.9 pg — ABNORMAL LOW (ref 26.0–34.0)
MCHC: 31.4 g/dL (ref 30.0–36.0)
MCV: 79.4 fL — ABNORMAL LOW (ref 80.0–100.0)
Monocytes Absolute: 0.1 10*3/uL (ref 0.1–1.0)
Monocytes Relative: 12 %
Neutro Abs: 0.4 10*3/uL — ABNORMAL LOW (ref 1.7–7.7)
Neutrophils Relative %: 37 %
Platelets: 12 10*3/uL — CL (ref 150–400)
RBC: 3.45 MIL/uL — ABNORMAL LOW (ref 3.87–5.11)
RDW: 20.4 % — ABNORMAL HIGH (ref 11.5–15.5)
Smear Review: DECREASED
WBC: 1 10*3/uL — CL (ref 4.0–10.5)
nRBC: 5.2 % — ABNORMAL HIGH (ref 0.0–0.2)

## 2018-12-27 LAB — SAMPLE TO BLOOD BANK

## 2018-12-27 MED ORDER — DIPHENHYDRAMINE HCL 25 MG PO CAPS
25.0000 mg | ORAL_CAPSULE | Freq: Once | ORAL | Status: AC
  Administered 2018-12-27: 25 mg via ORAL
  Filled 2018-12-27: qty 1

## 2018-12-27 MED ORDER — SODIUM CHLORIDE 0.9% FLUSH
10.0000 mL | Freq: Once | INTRAVENOUS | Status: AC
  Administered 2018-12-27: 09:00:00 10 mL via INTRAVENOUS
  Filled 2018-12-27: qty 10

## 2018-12-27 MED ORDER — ACETAMINOPHEN 325 MG PO TABS
650.0000 mg | ORAL_TABLET | Freq: Once | ORAL | Status: AC
  Administered 2018-12-27: 650 mg via ORAL
  Filled 2018-12-27: qty 2

## 2018-12-27 MED ORDER — HEPARIN SOD (PORK) LOCK FLUSH 100 UNIT/ML IV SOLN
500.0000 [IU] | Freq: Once | INTRAVENOUS | Status: AC
  Administered 2018-12-27: 500 [IU] via INTRAVENOUS

## 2018-12-27 MED ORDER — FILGRASTIM-SNDZ 300 MCG/0.5ML IJ SOSY
300.0000 ug | PREFILLED_SYRINGE | Freq: Once | INTRAMUSCULAR | Status: AC
  Administered 2018-12-27: 12:00:00 300 ug via SUBCUTANEOUS
  Filled 2018-12-27: qty 0.5

## 2018-12-27 MED ORDER — SODIUM CHLORIDE 0.9 % IV SOLN
INTRAVENOUS | Status: DC
  Administered 2018-12-27: 12:00:00 via INTRAVENOUS
  Filled 2018-12-27: qty 250

## 2018-12-27 NOTE — Patient Instructions (Addendum)
#   STOP VENATOCLAX UNTIL further instructions # STOP DIFLUCAN; and THEN start on Noxafil as recommended # STOP ALLOPURINOL/ZYLOPRIM

## 2018-12-27 NOTE — Assessment & Plan Note (Addendum)
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 Currently on cycle #1- Vidaza + Venatoclax day # 30   #Today labs show Hb 8.6 platelets 12;WBC- 0.7; absolute neutrophil count 0.4 LDH improving.  Day #28 bone marrow biopsy-  7/14- BMx- NO BLASTS; positive for dysplastic changes.  #Recommend holding venetoclax until further instructions.  Will delay reinitiation/until count recovery for the next 2 to 3 weeks.  We will continue aggressive supportive care.  # Supportive care:  # Continue Levaquin/Diflucan.;  With Noxafil available patient will stop Diflucan and start Noxafil. #Drug-induced neutropenia-recommend Granix x3 days ANC target greater than 1.  # TLS prophylaxis--discontinue allopurinol. # PICC dressing weekly.   # weight loss: Status post nutrition evaluation stable.  #Discussed with patient's husband-at length regarding patient's plan of care/labs etc. ----------------------------------------------------------------------------------------- # STOP VENATOCLAX UNTIL further instructions # STOP DIFLUCAN; and THEN start on Noxafil as recommended # STOP ALLOPURINOL/ZYLOPRIM  ----------------------------------------------------------------------------------------- # DISPOSITION: print labs   # platelets today; # Granix starting Monday- 7/20 x 3 days; start today. # labs- on 7/20 &7/22- CBC/ HOLD/tube/possible platelets # Follow up with MD- 7/24- cbc/cmp/LDH- HOLD tube-possible platelets- Dr.B  # 40 minutes face-to-face with the patient discussing the above plan of care; more than 50% of time spent on prognosis/ natural history; counseling and coordination.

## 2018-12-27 NOTE — Progress Notes (Signed)
[facility] CONSULT NOTE  Patient Care Team: Rusty Aus, MD as PCP - General (Internal Medicine)  CHIEF COMPLAINTS/PURPOSE OF CONSULTATION:    Oncology History Overview Note  # SEP 2017- MYELOPROLIFERATIVE NEOPLASM- [WBC- 27; normal Hb/platelets] hypercellular bone marrow 90-95% proliferation of myeloid cells in various stages of maturation; relative erythroid hypoplasia and proliferation of atypical megakaryocytes; no increase in blasts; peripheral blood Bcr-Abl-NEG; Cytogenetics- WNL. NEG- Jak-2/MPL/CALR [place] limited- mild splenomegaly [~10cm; 463cm3]; OCT 2017- second opinion at [facility]. Surveillance.   # With progressive leukocytosis we evaluated her a month ago and sent BM for exam. She has a progressive leukocytosis so hydrea '500mg'$  daily was started on 10/20/2016 and increased to 2gm daily then back down to one daily, then 5 days per week over time due to counts drop. Then we held her hydrea since 04/09/2018 due to continued declining Hb and Plt.s  BMB 06/24/18 showed markedly hypercellular BM 95% with myeloid hyperplasia and atypical megakaryocytic hyperplasia. No significant increase in blasts. 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 BCR / ABL negative, FISH normal. Flow Showed 2%CD34-positive myeloid blasts. Myeloid precursors with low side scatter. Pathogenic variants were detected in the ASXL1, CCND2, CUX1, and U2AF1 genes.  # March 2020- wbc- 14/ Hb 9.5/ platelets- 54.   ---------------------------------------------------   # June 8th 2020- Acute myeloid leukemia [peripheral blood flow cytometry;NGS- pending]- June 15th Vidaza [SQ 1-5 + Venatoclax- #1in pt]; tumor lysis prophylaxis    # 2002 [[place]] BREAST CA s/p Lumpect RT; [? Stage I] no chemo s/p AI  # F-ONE HEM: FLT3 ALTERATION NOTED  DIAGNOSIS: ACUTE MYELOID LEUKEMIA GOALS: pallaitive  CURRENT/MOST RECENT THERAPY : Vidaza+ venotoclax.       MDS (myelodysplastic syndrome) ([place]) (Resolved)  08/28/2018 Initial Diagnosis   MDS (myelodysplastic syndrome) (HCC)   AML (acute myeloid leukemia) with failed remission ([place])  11/25/2018 Initial Diagnosis   AML (acute myeloid leukemia) with failed remission ([place])   11/25/2018 - 12/22/2018 Chemotherapy   The patient had azaCITIDine (VIDAZA) chemo injection 105 mg, 75 mg/m2 = 105 mg, Subcutaneous,  Once, 1 of 4 cycles Administration: 105 mg (11/25/2018), 105 mg (11/26/2018), 105 mg (11/27/2018), 105 mg (11/28/2018), 105 mg (11/29/2018)  for chemotherapy treatment.    12/30/2018 -  Chemotherapy   The patient had PALONOSETRON HCL INJECTION 0.25 MG/5ML, 0.25 mg, Intravenous,  Once, 0 of 4 cycles azaCITIDine (VIDAZA) 100 mg in sodium chloride 0.9 % 50 mL chemo infusion, 75 mg/m2, Intravenous, Once, 0 of 4 cycles  for chemotherapy treatment.       HISTORY OF PRESENTING ILLNESS:  Kathryn Hahn 85 y.o.  female with acute myeloid leukemia with monocytic differentiation currently on Vidaza plus venetoclax is here for follow-up/review the results of the day #28 bone marrow biopsy.  Patient continues to need PRBC/platelet transfusion approximately once a week.  Complains of ongoing fatigue.  Complains of fair appetite.  No significant weight loss.  No nausea no vomiting no fevers.  No bleeding from the PICC site seen.   Review of Systems  Constitutional: Positive for malaise/fatigue. Negative for chills, diaphoresis, fever and weight loss.  HENT: Negative for nosebleeds and sore throat.   Eyes: Negative for double vision.  Respiratory: Positive for shortness of breath. Negative for cough, hemoptysis, sputum production and wheezing.   Cardiovascular: Negative for chest pain, palpitations and orthopnea.  Gastrointestinal: Negative for abdominal pain, blood in stool, constipation, diarrhea, heartburn, melena, nausea and vomiting.  Genitourinary: Negative for dysuria, frequency and urgency.  Musculoskeletal: Negative for back pain and joint pain.  Skin: Negative.  Negative for itching and rash.  Neurological: Negative for dizziness, tingling, focal weakness, weakness and headaches.  Endo/Heme/Allergies: Bruises/bleeds easily.  Psychiatric/Behavioral: Negative for depression. The patient is not nervous/anxious and does not have insomnia.        MEDICAL HISTORY:  Past Medical History:  Diagnosis Date  . Anemia   . Arthritis   . Breast cancer ([place]) 2003   RT LUMPECTOMY  . Edema leg   . History of breast cancer 2003   post lumpectomy  . History of kidney stones   . Hyperlipemia, mixed   . Hypertension, essential   . Leukemia ([place])   . Leukocytosis   . MDS (myelodysplastic syndrome) ([place])   . Personal history of radiation therapy 2003   BREAST CA  . Renal stones   . Vitamin D deficiency     SURGICAL HISTORY: Past Surgical History:  Procedure Laterality Date  . BREAST BIOPSY Right 2003   Postive for cancer  . BREAST LUMPECTOMY Right 2003   BREAST CA  . KIDNEY STONE SURGERY Right     SOCIAL HISTORY: Social History   Socioeconomic History  . Marital status: Married    Spouse name: Not on file  . Number of children: Not on file  . Years of education: Not on file  . Highest education level: Not on file  Occupational History  . Not on file  Social Needs  . Financial resource strain: Not on file  . Food insecurity    Worry: Not on file    Inability: Not on file  . Transportation needs    Medical: Not on file    Non-medical: Not on file  Tobacco Use  . Smoking status: Never Smoker  . Smokeless tobacco: Never Used  Substance and Sexual Activity  . Alcohol use: Yes    Alcohol/week: 3.0 standard drinks    Types: 3 Glasses of wine per week  . Drug use: No  . Sexual activity: Not on file  Lifestyle  . Physical activity    Days per week: Not on file    Minutes per session: Not on file  . Stress: Not on file  Relationships  . Social Product manager on phone: Not on file    Gets together: Not on file    Attends religious service: Not on file    Active member of club or organization: Not on file    Attends meetings of clubs or organizations: Not on file    Relationship status: Not on file  . Intimate partner violence    Fear of current or ex partner: Not on file    Emotionally abused: Not on file    Physically abused: Not on file    Forced sexual activity: Not on file  Other Topics Concern  . Not on file  Social History Narrative    No smoking/alcohol; lives in [place] with family.  She lives in assisted living with her husband.    FAMILY HISTORY: Family History  Problem Relation Age of Onset  . Stroke Mother   . Hypertension Father   . Stroke Father   . Breast cancer Neg Hx     ALLERGIES:  is allergic to terbinafine.  MEDICATIONS:  Current Outpatient Medications  Medication Sig Dispense Refill  . acyclovir (ZOVIRAX) 400 MG tablet One pill a day [to prevent shingles] 30 tablet 3  . allopurinol (ZYLOPRIM) 300 MG tablet Take 1  tablet (300 mg total) by mouth 2 (two) times daily. 120 tablet 0  . atenolol (TENORMIN) 50 MG tablet Take 1 tablet by mouth 2 (two) times daily.    Marland Kitchen CALCIUM-VITAMIN D PO Take 1 tablet by mouth daily.     . Cholecalciferol (VITAMIN D) 2000 units tablet Take 1 tablet by mouth daily.    . fluconazole (DIFLUCAN) 200 MG tablet Take 1 tablet (200 mg total) by mouth daily. 10 tablet 0  . iron polysaccharides (FERREX 150) 150 MG capsule Take 1 capsule (150 mg total) by mouth daily. (Patient taking differently: Take 150 mg by mouth 2 (two) times daily. ) 90 capsule 1  . levofloxacin (LEVAQUIN) 250 MG tablet Take 1 tablet (250 mg total) by mouth daily. 30 tablet 3  . losartan-hydrochlorothiazide (HYZAAR) 50-12.5 MG tablet Take 1 tablet by mouth daily.    . magic mouthwash w/lidocaine SOLN Take 5 mLs by mouth 4 (four) times daily as needed for mouth pain. 480 mL 3  . Omega-3 Fatty Acids (FISH OIL  PO) Take 1 capsule by mouth daily.    . posaconazole (NOXAFIL) 100 MG TBEC delayed-release tablet Take 2 tablets (200 mg total) by mouth daily. DO NOT START UNTIL-OK with MD. 60 tablet 3  . potassium chloride SA (K-DUR) 20 MEQ tablet 1 pill twice a day 30 tablet 3  . venetoclax (VENCLEXTA) 100 MG TABS Take 200 mg by mouth daily. Take with food and water. 120 tablet 0  . vitamin C (ASCORBIC ACID) 500 MG tablet Take 1 tablet by mouth 2 (two) times daily.     No current facility-administered medications for this visit.    Facility-Administered Medications Ordered in Other Visits  Medication Dose Route Frequency Provider Last Rate Last Dose  . 0.9 %  sodium chloride infusion   Intravenous Continuous Cammie Sickle, MD 10 mL/hr at 12/27/18 1154        .  PHYSICAL EXAMINATION: ECOG PERFORMANCE STATUS: 0 - Asymptomatic  Vitals:   12/27/18 0854  BP: 128/78  Pulse: 73  Temp: (!) 97.4 F (36.3 C)   Filed Weights   12/27/18 0854  Weight: 95 lb 9.6 oz (43.4 kg)    Physical Exam  Constitutional: She is oriented to person, place, and time and well-developed, well-nourished, and in no distress.  HENT:  Head: Normocephalic and atraumatic.  Mouth/Throat: Oropharynx is clear and moist. No oropharyngeal exudate.  Eyes: Pupils are equal, round, and reactive to light.  Neck: Normal range of motion. Neck supple.  Cardiovascular: Normal rate and regular rhythm.  Pulmonary/Chest: No respiratory distress. She has no wheezes.  Abdominal: Soft. Bowel sounds are normal. She exhibits no distension and no mass. There is no abdominal tenderness. There is no rebound and no guarding.  Musculoskeletal: Normal range of motion.        General: Edema present. No tenderness.  Neurological: She is alert and oriented to person, place, and time.  Skin: Skin is warm.  Multiple bruises noted.  Positive for mild bleeding around the site of the PICC line insertion.  Multiple abdominal wall bruises noted.   Psychiatric: Affect normal.  ;   LABORATORY DATA:  I have reviewed the data as listed Lab Results  Component Value Date   WBC 1.0 (LL) 12/27/2018   HGB 8.6 (L) 12/27/2018   HCT 27.4 (L) 12/27/2018   MCV 79.4 (L) 12/27/2018   PLT 12 (LL) 12/27/2018   Recent Labs    11/20/18 0814 11/25/18 0904 11/25/18 1219  12/12/18 1123 12/17/18 0855 12/19/18 0919  NA 140 137 138   < > 135 136 137  K 2.9* 3.7 3.8   < > 4.0 4.0 3.7  CL 99 99 101   < > 99 101 102  CO2 _0 < > _1 GLUCOSE 130* 159* 110*   < > 152* 132* 130*  BUN 24* 24* 23   < > 28* 33* 27*  CREATININE 0.90 0.90 0.88   < > 0.69 0.83 0.57  CALCIUM 10.3 10.1 10.5*   < > 9.1 9.1 9.3  GFRNONAA 60* 60* >60   < > >60 >60 >60  GFRAA >60 >60 >60   < > >60 >60 >60  PROT 7.1 7.0 7.4  --   --   --   --   ALBUMIN 4.0 4.1 4.3  --   --   --   --   AST 39 37 39  --   --   --   --   ALT _2 --   --   --   --   ALKPHOS 65 62 66  --   --   --   --   BILITOT 0.7 0.5 0.9  --   --   --   --    < > = values in this interval not displayed.    RADIOGRAPHIC STUDIES: I have personally reviewed the radiological images as listed and agreed with the findings in the report. US Venous Img Upper Uni Left  Result Date: 12/03/2018 CLINICAL DATA:  Indwelling left upper extremity PICC line. Left upper extremity edema. EXAM: LEFT UPPER EXTREMITY VENOUS DOPPLER ULTRASOUND TECHNIQUE: Gray-scale sonography with graded compression, as well as color Doppler and duplex ultrasound were performed to evaluate the upper extremity deep venous system from the level of the subclavian vein and including the jugular, axillary, basilic, radial, ulnar and upper cephalic vein. Spectral Doppler was utilized to evaluate flow at rest and with distal augmentation maneuvers. COMPARISON:  None. FINDINGS: Contralateral Subclavian Vein: Respiratory phasicity is normal and symmetric with the symptomatic side. No evidence of thrombus. Normal compressibility. Internal  Jugular Vein: No evidence of thrombus. Normal compressibility, respiratory phasicity and response to augmentation. Subclavian Vein: No evidence of thrombus. Visible PICC line without surrounding thrombus. Normal compressibility, respiratory phasicity and response to augmentation. Axillary Vein: No evidence of thrombus. Visible PICC line without surrounding thrombus. Normal compressibility, respiratory phasicity and response to augmentation. Cephalic Vein: No evidence of thrombus. Normal compressibility, respiratory phasicity and response to augmentation. Basilic Vein: No evidence of thrombus. PICC line visible extending in the left basilic vein without thrombus. Normal compressibility, respiratory phasicity and response to augmentation. Brachial Veins: No evidence of thrombus. Normal compressibility, respiratory phasicity and response to augmentation. Radial Veins: No evidence of thrombus. Normal compressibility, respiratory phasicity and response to augmentation. Ulnar Veins: No evidence of thrombus. Normal compressibility, respiratory phasicity and response to augmentation. Venous Reflux:  None visualized. Other Findings: No evidence of superficial thrombophlebitis or abnormal fluid collection. IMPRESSION: No evidence of DVT within the left upper extremity. No visible thrombus surrounding an indwelling PICC line. Electronically Signed   By: Aletta Edouard M.D.   On: 12/03/2018 16:00   Ct Bone Marrow Biopsy & Aspiration  Result Date: 12/24/2018 INDICATION: Acute myeloid leukemia with failed remission EXAM: CT-GUIDED BONE MARROW ASPIRATION AND BIOPSY MEDICATIONS: None. ANESTHESIA/SEDATION: Moderate (conscious) sedation was employed during this procedure. A total of Versed 1 mg and  Fentanyl 25 mcg was administered intravenously. Moderate Sedation Time: 16 minutes. The patient's level of consciousness and vital signs were monitored continuously by radiology nursing throughout the procedure under my direct  supervision. FLUOROSCOPY TIME:  Not applicable COMPLICATIONS: None immediate. PROCEDURE: Informed written consent was obtained from the patient after a thorough discussion of the procedural risks, benefits and alternatives. All questions were addressed. Maximal Sterile Barrier Technique was utilized including caps, mask, sterile gowns, sterile gloves, sterile drape, hand hygiene and skin antiseptic. A timeout was performed prior to the initiation of the procedure. Utilizing 0.25% Marcaine as a local and deep periosteal anesthetic and CT guidance, an Arrow OnControl bone biopsy needle was placed percutaneously into the left iliac bone. Initial aspiration samples were obtained. Subsequently 2 core biopsy passes were obtained and sent for pathologic evaluation. These were deemed adequate. The needle was removed. Hemostasis was obtained at the puncture site. Patient tolerated the procedure well and was returned to her room in satisfactory condition. IMPRESSION: Successful CT-guided bone marrow biopsy and aspiration as described. Electronically Signed   By: Inez Catalina M.D.   On: 12/24/2018 09:41   Mm 3d Screen Breast Bilateral  Result Date: 12/20/2018 CLINICAL DATA:  Screening. EXAM: DIGITAL SCREENING BILATERAL MAMMOGRAM WITH TOMO AND CAD COMPARISON:  Previous exam(s). ACR Breast Density Category b: There are scattered areas of fibroglandular density. FINDINGS: There are no findings suspicious for malignancy. Images were processed with CAD. IMPRESSION: No mammographic evidence of malignancy. A result letter of this screening mammogram will be mailed directly to the patient. RECOMMENDATION: Screening mammogram in one year. (Code:SM-B-01Y) BI-RADS CATEGORY  1: Negative. Electronically Signed   By: Curlene Dolphin M.D.   On: 12/20/2018 11:41   [place] Ekg Site Rite  Result Date: 12/02/2018 If Site Rite image not attached, placement could not be confirmed due to current cardiac rhythm.   ASSESSMENT & PLAN:   AML  (acute myeloid leukemia) with failed remission (HCC) #Acute myeloid leukemia with monocytic differentiation [20% blasts on peripheral blood flow cytometry]. Currently on cycle #1- Vidaza + Venatoclax day # 30   #Today labs show Hb 8.6 platelets 12;WBC- 0.7; absolute neutrophil count 0.4 LDH improving.  Day #28 bone marrow biopsy-  7/14- BMx- NO BLASTS; positive for dysplastic changes.  #Recommend holding venetoclax until further instructions.  Will delay reinitiation/until count recovery for the next 2 to 3 weeks.  We will continue aggressive supportive care.  # Supportive care:  # Continue Levaquin/Diflucan.;  With Noxafil available patient will stop Diflucan and start Noxafil. #Drug-induced neutropenia-recommend Granix x3 days ANC target greater than 1.  # TLS prophylaxis--discontinue allopurinol. # PICC dressing weekly.   # weight loss: Status post nutrition evaluation stable.  #Discussed with patient's husband-at length regarding patient's plan of care/labs etc. ----------------------------------------------------------------------------------------- # STOP VENATOCLAX UNTIL further instructions # STOP DIFLUCAN; and THEN start on Noxafil as recommended # STOP ALLOPURINOL/ZYLOPRIM  ----------------------------------------------------------------------------------------- # DISPOSITION: print labs   # platelets today; # Granix starting Monday- 7/20 x 3 days; start today. # labs- on 7/20 &7/22- CBC/ HOLD/tube/possible platelets # Follow up with MD- 7/24- cbc/cmp/LDH- HOLD tube-possible platelets- Dr.B  # 40 minutes face-to-face with the patient discussing the above plan of care; more than 50% of time spent on prognosis/ natural history; counseling and coordination.      Cammie Sickle, MD 12/27/2018 12:45 PM

## 2018-12-27 NOTE — Telephone Encounter (Signed)
Called [employer] to check the shipment status of medication.  Rep reached out to [facility] and they require 2-3 business days to process the prescription and they received information from [employer] on Wednesday.  [employer] rep stated to call [facility] on Monday to check for status.    [facility] ph # [phone] option 3.

## 2018-12-27 NOTE — Progress Notes (Signed)
0459 am -Notified of Critical plt count of 12; anc of 0.4 by Collene Mares- lab tech to Renita Papa, RN. Dr. Rogue Bussing made aware. 1 unit of plts. irradiated order per v/o Dr. Rogue Bussing. Read back process for tech/md performed by RN  hgb 8.6- no blood needed.

## 2018-12-28 LAB — BPAM PLATELET PHERESIS
Blood Product Expiration Date: 202007182359
Blood Product Expiration Date: 202007202359
ISSUE DATE / TIME: 202007171215
Unit Type and Rh: 5100
Unit Type and Rh: 6200

## 2018-12-28 LAB — PREPARE PLATELET PHERESIS
Unit division: 0
Unit division: 0

## 2018-12-30 ENCOUNTER — Inpatient Hospital Stay: Payer: Medicare Other

## 2018-12-30 ENCOUNTER — Encounter (HOSPITAL_COMMUNITY): Payer: Self-pay | Admitting: Internal Medicine

## 2018-12-30 ENCOUNTER — Other Ambulatory Visit: Payer: Self-pay

## 2018-12-30 ENCOUNTER — Inpatient Hospital Stay: Payer: Medicare Other | Admitting: *Deleted

## 2018-12-30 VITALS — BP 118/64 | HR 69 | Resp 17

## 2018-12-30 DIAGNOSIS — Z452 Encounter for adjustment and management of vascular access device: Secondary | ICD-10-CM

## 2018-12-30 DIAGNOSIS — D702 Other drug-induced agranulocytosis: Secondary | ICD-10-CM

## 2018-12-30 DIAGNOSIS — D469 Myelodysplastic syndrome, unspecified: Secondary | ICD-10-CM

## 2018-12-30 DIAGNOSIS — C9200 Acute myeloblastic leukemia, not having achieved remission: Secondary | ICD-10-CM | POA: Diagnosis not present

## 2018-12-30 LAB — CBC WITH DIFFERENTIAL/PLATELET
Abs Immature Granulocytes: 0.06 10*3/uL (ref 0.00–0.07)
Basophils Absolute: 0 10*3/uL (ref 0.0–0.1)
Basophils Relative: 1 %
Eosinophils Absolute: 0 10*3/uL (ref 0.0–0.5)
Eosinophils Relative: 0 %
HCT: 27 % — ABNORMAL LOW (ref 36.0–46.0)
Hemoglobin: 8.5 g/dL — ABNORMAL LOW (ref 12.0–15.0)
Immature Granulocytes: 3 %
Lymphocytes Relative: 31 %
Lymphs Abs: 0.6 10*3/uL — ABNORMAL LOW (ref 0.7–4.0)
MCH: 24.8 pg — ABNORMAL LOW (ref 26.0–34.0)
MCHC: 31.5 g/dL (ref 30.0–36.0)
MCV: 78.7 fL — ABNORMAL LOW (ref 80.0–100.0)
Monocytes Absolute: 0.3 10*3/uL (ref 0.1–1.0)
Monocytes Relative: 15 %
Neutro Abs: 0.9 10*3/uL — ABNORMAL LOW (ref 1.7–7.7)
Neutrophils Relative %: 50 %
Platelets: 22 10*3/uL — CL (ref 150–400)
RBC: 3.43 MIL/uL — ABNORMAL LOW (ref 3.87–5.11)
RDW: 20.8 % — ABNORMAL HIGH (ref 11.5–15.5)
Smear Review: NORMAL
WBC: 1.8 10*3/uL — ABNORMAL LOW (ref 4.0–10.5)
nRBC: 2.2 % — ABNORMAL HIGH (ref 0.0–0.2)

## 2018-12-30 LAB — SAMPLE TO BLOOD BANK

## 2018-12-30 MED ORDER — SODIUM CHLORIDE 0.9% FLUSH
10.0000 mL | Freq: Once | INTRAVENOUS | Status: AC
  Administered 2018-12-30: 10:00:00 10 mL via INTRAVENOUS
  Filled 2018-12-30: qty 10

## 2018-12-30 MED ORDER — HEPARIN SOD (PORK) LOCK FLUSH 100 UNIT/ML IV SOLN
250.0000 [IU] | Freq: Once | INTRAVENOUS | Status: AC
  Administered 2018-12-30: 250 [IU] via INTRAVENOUS
  Filled 2018-12-30: qty 5

## 2018-12-30 MED ORDER — FILGRASTIM-AAFI 300 MCG/0.5ML IJ SOSY
300.0000 ug | PREFILLED_SYRINGE | Freq: Once | INTRAMUSCULAR | Status: AC
  Administered 2018-12-30: 11:00:00 300 ug via SUBCUTANEOUS
  Filled 2018-12-30: qty 0.5

## 2018-12-30 NOTE — Telephone Encounter (Signed)
Oral Chemotherapy Pharmacist Encounter   Called [facility] pharmacy to check on the status of the prescription. Per the pharmacy, the Noxafil for Kathryn Hahn will be delivered on 7/21. Informed Dr. Rogue Bussing. I spoke with the patient's husband and let him know about the delivery. Also reviewed with him  The plan of stopping the fluconazole when she starts the posaconzole. She will continue to hold the venetoclax until instructed to restart by Dr. Rogue Bussing.  Darl Pikes, PharmD, BCPS, [facility] Hematology/Oncology Clinical Pharmacist [facility]/HP/AP [facility] [phone]  12/30/2018 2:07 PM

## 2018-12-30 NOTE — Progress Notes (Signed)
Results reviewed with MD. PLT 22 & Hgb-8.5. Per MD pt does not need blood or PLT transfusion today. Pt updated and all questions answered at this time.   Abhiraj Dozal [employer]

## 2018-12-31 ENCOUNTER — Other Ambulatory Visit: Payer: Self-pay | Admitting: Internal Medicine

## 2018-12-31 ENCOUNTER — Inpatient Hospital Stay: Payer: Medicare Other

## 2018-12-31 ENCOUNTER — Other Ambulatory Visit: Payer: Self-pay

## 2018-12-31 DIAGNOSIS — D469 Myelodysplastic syndrome, unspecified: Secondary | ICD-10-CM

## 2018-12-31 DIAGNOSIS — C9200 Acute myeloblastic leukemia, not having achieved remission: Secondary | ICD-10-CM | POA: Diagnosis not present

## 2018-12-31 MED ORDER — POTASSIUM CHLORIDE CRYS ER 20 MEQ PO TBCR: EXTENDED_RELEASE_TABLET | ORAL | 3 refills | Status: DC

## 2018-12-31 MED ORDER — FILGRASTIM-AAFI 300 MCG/0.5ML IJ SOSY
300.0000 ug | PREFILLED_SYRINGE | Freq: Once | INTRAMUSCULAR | Status: AC
  Administered 2018-12-31: 10:00:00 300 ug via SUBCUTANEOUS
  Filled 2018-12-31: qty 0.5

## 2018-12-31 NOTE — Addendum Note (Signed)
Addended by: Sabino Gasser on: 12/31/2018 04:54 PM   Modules accepted: Orders

## 2018-12-31 NOTE — Telephone Encounter (Signed)
...    Ref Range & Units 12d ago (12/19/18) 2wk ago (12/17/18) 2wk ago (12/12/18) 3wk ago (12/06/18)  Potassium 3.5 - 5.1 mmol/L 3.7  4.0  4.0  3.7

## 2019-01-01 ENCOUNTER — Ambulatory Visit: Payer: PRIVATE HEALTH INSURANCE

## 2019-01-01 ENCOUNTER — Other Ambulatory Visit: Payer: Self-pay

## 2019-01-01 ENCOUNTER — Inpatient Hospital Stay: Payer: Medicare Other

## 2019-01-01 ENCOUNTER — Inpatient Hospital Stay: Payer: Medicare Other | Admitting: *Deleted

## 2019-01-01 DIAGNOSIS — C9200 Acute myeloblastic leukemia, not having achieved remission: Secondary | ICD-10-CM | POA: Diagnosis not present

## 2019-01-01 DIAGNOSIS — Z452 Encounter for adjustment and management of vascular access device: Secondary | ICD-10-CM

## 2019-01-01 DIAGNOSIS — Z95828 Presence of other vascular implants and grafts: Secondary | ICD-10-CM

## 2019-01-01 DIAGNOSIS — D702 Other drug-induced agranulocytosis: Secondary | ICD-10-CM

## 2019-01-01 LAB — CBC WITH DIFFERENTIAL/PLATELET
Abs Immature Granulocytes: 0.49 10*3/uL — ABNORMAL HIGH (ref 0.00–0.07)
Basophils Absolute: 0 10*3/uL (ref 0.0–0.1)
Basophils Relative: 1 %
Eosinophils Absolute: 0 10*3/uL (ref 0.0–0.5)
Eosinophils Relative: 0 %
HCT: 26.3 % — ABNORMAL LOW (ref 36.0–46.0)
Hemoglobin: 8.3 g/dL — ABNORMAL LOW (ref 12.0–15.0)
Immature Granulocytes: 10 %
Lymphocytes Relative: 12 %
Lymphs Abs: 0.6 10*3/uL — ABNORMAL LOW (ref 0.7–4.0)
MCH: 24.6 pg — ABNORMAL LOW (ref 26.0–34.0)
MCHC: 31.6 g/dL (ref 30.0–36.0)
MCV: 77.8 fL — ABNORMAL LOW (ref 80.0–100.0)
Monocytes Absolute: 0.6 10*3/uL (ref 0.1–1.0)
Monocytes Relative: 11 %
Neutro Abs: 3.5 10*3/uL (ref 1.7–7.7)
Neutrophils Relative %: 66 %
Platelets: 19 10*3/uL — CL (ref 150–400)
RBC: 3.38 MIL/uL — ABNORMAL LOW (ref 3.87–5.11)
RDW: 20.8 % — ABNORMAL HIGH (ref 11.5–15.5)
Smear Review: NORMAL
WBC: 5.2 10*3/uL (ref 4.0–10.5)
nRBC: 1 % — ABNORMAL HIGH (ref 0.0–0.2)

## 2019-01-01 LAB — SAMPLE TO BLOOD BANK

## 2019-01-01 MED ORDER — SODIUM CHLORIDE 0.9% FLUSH
10.0000 mL | Freq: Once | INTRAVENOUS | Status: AC
  Administered 2019-01-01: 10:00:00 10 mL via INTRAVENOUS
  Filled 2019-01-01: qty 10

## 2019-01-01 NOTE — Progress Notes (Signed)
No platelet transfusion or Nivestym needed today since her platelets were 19 and ANC is 3.5, per Dr. Rogue Bussing. Patient made aware and was informed to come back on Friday to see her doctor and possible injection.

## 2019-01-02 ENCOUNTER — Other Ambulatory Visit: Payer: Self-pay | Admitting: Internal Medicine

## 2019-01-02 ENCOUNTER — Other Ambulatory Visit: Payer: Self-pay

## 2019-01-03 ENCOUNTER — Inpatient Hospital Stay: Payer: Medicare Other

## 2019-01-03 ENCOUNTER — Other Ambulatory Visit: Payer: Self-pay

## 2019-01-03 ENCOUNTER — Inpatient Hospital Stay: Payer: Medicare Other | Admitting: *Deleted

## 2019-01-03 ENCOUNTER — Inpatient Hospital Stay (HOSPITAL_BASED_OUTPATIENT_CLINIC_OR_DEPARTMENT_OTHER): Payer: Medicare Other | Admitting: Internal Medicine

## 2019-01-03 DIAGNOSIS — Z853 Personal history of malignant neoplasm of breast: Secondary | ICD-10-CM | POA: Diagnosis not present

## 2019-01-03 DIAGNOSIS — Z79899 Other long term (current) drug therapy: Secondary | ICD-10-CM | POA: Diagnosis not present

## 2019-01-03 DIAGNOSIS — Z452 Encounter for adjustment and management of vascular access device: Secondary | ICD-10-CM

## 2019-01-03 DIAGNOSIS — C9200 Acute myeloblastic leukemia, not having achieved remission: Secondary | ICD-10-CM

## 2019-01-03 DIAGNOSIS — K5900 Constipation, unspecified: Secondary | ICD-10-CM

## 2019-01-03 DIAGNOSIS — D702 Other drug-induced agranulocytosis: Secondary | ICD-10-CM

## 2019-01-03 LAB — CBC WITH DIFFERENTIAL/PLATELET
Abs Immature Granulocytes: 0.17 10*3/uL — ABNORMAL HIGH (ref 0.00–0.07)
Basophils Absolute: 0 10*3/uL (ref 0.0–0.1)
Basophils Relative: 0 %
Eosinophils Absolute: 0 10*3/uL (ref 0.0–0.5)
Eosinophils Relative: 0 %
HCT: 26.5 % — ABNORMAL LOW (ref 36.0–46.0)
Hemoglobin: 8.5 g/dL — ABNORMAL LOW (ref 12.0–15.0)
Immature Granulocytes: 5 %
Lymphocytes Relative: 24 %
Lymphs Abs: 0.8 10*3/uL (ref 0.7–4.0)
MCH: 24.5 pg — ABNORMAL LOW (ref 26.0–34.0)
MCHC: 32.1 g/dL (ref 30.0–36.0)
MCV: 76.4 fL — ABNORMAL LOW (ref 80.0–100.0)
Monocytes Absolute: 0.6 10*3/uL (ref 0.1–1.0)
Monocytes Relative: 17 %
Neutro Abs: 1.8 10*3/uL (ref 1.7–7.7)
Neutrophils Relative %: 54 %
Platelets: 25 10*3/uL — CL (ref 150–400)
RBC: 3.47 MIL/uL — ABNORMAL LOW (ref 3.87–5.11)
RDW: 21 % — ABNORMAL HIGH (ref 11.5–15.5)
Smear Review: DECREASED
WBC: 3.3 10*3/uL — ABNORMAL LOW (ref 4.0–10.5)
nRBC: 1.5 % — ABNORMAL HIGH (ref 0.0–0.2)

## 2019-01-03 LAB — COMPREHENSIVE METABOLIC PANEL
ALT: 12 U/L (ref 0–44)
AST: 19 U/L (ref 15–41)
Albumin: 3.6 g/dL (ref 3.5–5.0)
Alkaline Phosphatase: 106 U/L (ref 38–126)
Anion gap: 11 (ref 5–15)
BUN: 21 mg/dL (ref 8–23)
CO2: 24 mmol/L (ref 22–32)
Calcium: 8.9 mg/dL (ref 8.9–10.3)
Chloride: 100 mmol/L (ref 98–111)
Creatinine, Ser: 0.59 mg/dL (ref 0.44–1.00)
GFR calc Af Amer: >60 mL/min (ref >60)
GFR calc non Af Amer: >60 mL/min (ref >60)
Glucose, Bld: 152 mg/dL — ABNORMAL HIGH (ref 70–99)
Potassium: 3.7 mmol/L (ref 3.5–5.1)
Sodium: 135 mmol/L (ref 135–145)
Total Bilirubin: 0.6 mg/dL (ref 0.3–1.2)
Total Protein: 7 g/dL (ref 6.5–8.1)

## 2019-01-03 LAB — SAMPLE TO BLOOD BANK

## 2019-01-03 LAB — LACTATE DEHYDROGENASE: LDH: 254 U/L — ABNORMAL HIGH (ref 98–192)

## 2019-01-03 MED ORDER — SODIUM CHLORIDE 0.9% FLUSH
10.0000 mL | Freq: Once | INTRAVENOUS | Status: AC
  Administered 2019-01-03: 10 mL via INTRAVENOUS
  Filled 2019-01-03: qty 10

## 2019-01-03 NOTE — Progress Notes (Signed)
[facility] CONSULT NOTE  Patient Care Team: Rusty Aus, MD as PCP - General (Internal Medicine)  CHIEF COMPLAINTS/PURPOSE OF CONSULTATION:    Oncology History Overview Note  # SEP 2017- MYELOPROLIFERATIVE NEOPLASM- [WBC- 57; normal Hb/platelets] hypercellular bone marrow 90-95% proliferation of myeloid cells in various stages of maturation; relative erythroid hypoplasia and proliferation of atypical megakaryocytes; no increase in blasts; peripheral blood Bcr-Abl-NEG; Cytogenetics- WNL. NEG- Jak-2/MPL/CALR [place] limited- mild splenomegaly [~10cm; 463cm3]; OCT 2017- second opinion at [facility]. Surveillance.   # With progressive leukocytosis we evaluated her a month ago and sent BM for exam. She has a progressive leukocytosis so hydrea '500mg'$  daily was started on 10/20/2016 and increased to 2gm daily then back down to one daily, then 5 days per week over time due to counts drop. Then we held her hydrea since 04/09/2018 due to continued declining Hb and Plt.s  BMB 06/24/18 showed markedly hypercellular BM 95% with myeloid hyperplasia and atypical megakaryocytic hyperplasia. No significant increase in blasts. 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 BCR / ABL negative, FISH normal. Flow Showed 2%CD34-positive myeloid blasts. Myeloid precursors with low side scatter. Pathogenic variants were detected in the ASXL1, CCND2, CUX1, and U2AF1 genes.  # March 2020- wbc- 14/ Hb 9.5/ platelets- 54.   ---------------------------------------------------   # June 8th 2020- Acute myeloid leukemia [peripheral blood flow cytometry;NGS- pending]- June 15th Vidaza [SQ 1-5 + Venatoclax- #1in pt]; tumor lysis prophylaxis  # Day #28 bone marrow biopsy-  7/14- BMx- NO BLASTS; positive for dysplastic changes. Currently off Venatoclax [since 7/17] sec to cytopenias.  # aug 3rd 2020-    # 2002 [[place]] BREAST CA s/p Lumpect RT; [? Stage I] no  chemo s/p AI  # F-ONE HEM: FLT3 ALTERATION NOTED  DIAGNOSIS: ACUTE MYELOID LEUKEMIA GOALS: pallaitive  CURRENT/MOST RECENT THERAPY : Vidaza+ venotoclax.      MDS (myelodysplastic syndrome) ([place]) (Resolved)  08/28/2018 Initial Diagnosis   MDS (myelodysplastic syndrome) (HCC)   AML (acute myeloid leukemia) with failed remission ([place])  11/25/2018 Initial Diagnosis   AML (acute myeloid leukemia) with failed remission ([place])   11/25/2018 - 12/22/2018 Chemotherapy   The patient had azaCITIDine (VIDAZA) chemo injection 105 mg, 75 mg/m2 = 105 mg, Subcutaneous,  Once, 1 of 4 cycles Administration: 105 mg (11/25/2018), 105 mg (11/26/2018), 105 mg (11/27/2018), 105 mg (11/28/2018), 105 mg (11/29/2018)  for chemotherapy treatment.    12/30/2018 -  Chemotherapy   The patient had PALONOSETRON HCL INJECTION 0.25 MG/5ML, 0.25 mg, Intravenous,  Once, 0 of 4 cycles azaCITIDine (VIDAZA) 100 mg in sodium chloride 0.9 % 50 mL chemo infusion, 75 mg/m2, Intravenous, Once, 0 of 4 cycles  for chemotherapy treatment.       HISTORY OF PRESENTING ILLNESS:  Kathryn Hahn 85 y.o.  female with acute myeloid leukemia with monocytic differentiation currently on Vidaza plus venetoclax is here for follow-up.  Patient is day 28 bone marrow biopsy-no evidence of any blasts; positive for dysplastic changes.  Patient is currently off her venetoclax for the last 1 week.  Patient has not had needed any transfusion PRBC of platelets since coming off venetoclax.  Patient received 3 injections G-CSF.  Appetite improving.  Weight stable.  No nausea no vomiting no fevers.  No bleeding from the PICC site.   Possible constipation.  Improved with prune juice.  Review of Systems  Constitutional: Positive for malaise/fatigue. Negative for chills, diaphoresis, fever and weight loss.  HENT: Negative for nosebleeds and sore throat.  Eyes: Negative for double vision.  Respiratory: Negative for cough, hemoptysis, sputum production  and wheezing.   Cardiovascular: Negative for chest pain, palpitations and orthopnea.  Gastrointestinal: Positive for constipation. Negative for abdominal pain, blood in stool, diarrhea, heartburn, melena, nausea and vomiting.  Genitourinary: Negative for dysuria, frequency and urgency.  Musculoskeletal: Negative for back pain and joint pain.  Skin: Negative.  Negative for itching and rash.  Neurological: Negative for dizziness, tingling, focal weakness, weakness and headaches.  Psychiatric/Behavioral: Negative for depression. The patient is not nervous/anxious and does not have insomnia.     MEDICAL HISTORY:  Past Medical History:  Diagnosis Date  . Anemia   . Arthritis   . Breast cancer ([place]) 2003   RT LUMPECTOMY  . Edema leg   . History of breast cancer 2003   post lumpectomy  . History of kidney stones   . Hyperlipemia, mixed   . Hypertension, essential   . Leukemia ([place])   . Leukocytosis   . MDS (myelodysplastic syndrome) ([place])   . Personal history of radiation therapy 2003   BREAST CA  . Renal stones   . Vitamin D deficiency     SURGICAL HISTORY: Past Surgical History:  Procedure Laterality Date  . BREAST BIOPSY Right 2003   Postive for cancer  . BREAST LUMPECTOMY Right 2003   BREAST CA  . KIDNEY STONE SURGERY Right     SOCIAL HISTORY: Social History   Socioeconomic History  . Marital status: Married    Spouse name: Not on file  . Number of children: Not on file  . Years of education: Not on file  . Highest education level: Not on file  Occupational History  . Not on file  Social Needs  . Financial resource strain: Not on file  . Food insecurity    Worry: Not on file    Inability: Not on file  . Transportation needs    Medical: Not on file    Non-medical: Not on file  Tobacco Use  . Smoking status: Never Smoker  . Smokeless tobacco: Never Used  Substance and Sexual Activity  . Alcohol use: Yes    Alcohol/week: 3.0 standard drinks    Types: 3  Glasses of wine per week  . Drug use: No  . Sexual activity: Not on file  Lifestyle  . Physical activity    Days per week: Not on file    Minutes per session: Not on file  . Stress: Not on file  Relationships  . Social Herbalist on phone: Not on file    Gets together: Not on file    Attends religious service: Not on file    Active member of club or organization: Not on file    Attends meetings of clubs or organizations: Not on file    Relationship status: Not on file  . Intimate partner violence    Fear of current or ex partner: Not on file    Emotionally abused: Not on file    Physically abused: Not on file    Forced sexual activity: Not on file  Other Topics Concern  . Not on file  Social History Narrative    No smoking/alcohol; lives in [place] with family.  She lives in assisted living with her husband.    FAMILY HISTORY: Family History  Problem Relation Age of Onset  . Stroke Mother   . Hypertension Father   . Stroke Father   . Breast cancer Neg Hx  ALLERGIES:  is allergic to terbinafine.  MEDICATIONS:  Current Outpatient Medications  Medication Sig Dispense Refill  . acyclovir (ZOVIRAX) 400 MG tablet One pill a day [to prevent shingles] 30 tablet 3  . allopurinol (ZYLOPRIM) 300 MG tablet Take 1 tablet (300 mg total) by mouth 2 (two) times daily. 120 tablet 0  . atenolol (TENORMIN) 50 MG tablet Take 1 tablet by mouth 2 (two) times daily.    Marland Kitchen CALCIUM-VITAMIN D PO Take 1 tablet by mouth daily.     . Cholecalciferol (VITAMIN D) 2000 units tablet Take 1 tablet by mouth daily.    . iron polysaccharides (FERREX 150) 150 MG capsule Take 1 capsule (150 mg total) by mouth daily. 90 capsule 1  . levofloxacin (LEVAQUIN) 250 MG tablet Take 1 tablet (250 mg total) by mouth daily. 30 tablet 3  . losartan-hydrochlorothiazide (HYZAAR) 50-12.5 MG tablet Take 1 tablet by mouth daily.    . magic mouthwash w/lidocaine SOLN Take 5 mLs by mouth 4 (four) times daily  as needed for mouth pain. 480 mL 3  . Omega-3 Fatty Acids (FISH OIL PO) Take 1 capsule by mouth daily.    . posaconazole (NOXAFIL) 100 MG TBEC delayed-release tablet Take 2 tablets (200 mg total) by mouth daily. DO NOT START UNTIL-OK with MD. 60 tablet 3  . potassium chloride SA (KLOR-CON M20) 20 MEQ tablet TAKE 1 TABLET BY MOUTH TWICE A DAY 30 tablet 3  . venetoclax (VENCLEXTA) 100 MG TABS Take 200 mg by mouth daily. Take with food and water. 120 tablet 0  . vitamin C (ASCORBIC ACID) 500 MG tablet Take 1 tablet by mouth 2 (two) times daily.     No current facility-administered medications for this visit.       Marland Kitchen  PHYSICAL EXAMINATION: ECOG PERFORMANCE STATUS: 0 - Asymptomatic  Vitals:   01/03/19 0918  BP: (!) 144/78  Pulse: 72  Resp: 20  Temp: 97.8 F (36.6 C)   Filed Weights   01/03/19 0918  Weight: 95 lb 9.6 oz (43.4 kg)    Physical Exam  Constitutional: She is oriented to person, place, and time and well-developed, well-nourished, and in no distress.  HENT:  Head: Normocephalic and atraumatic.  Mouth/Throat: Oropharynx is clear and moist. No oropharyngeal exudate.  Eyes: Pupils are equal, round, and reactive to light.  Neck: Normal range of motion. Neck supple.  Cardiovascular: Normal rate and regular rhythm.  Pulmonary/Chest: No respiratory distress. She has no wheezes.  Abdominal: Soft. Bowel sounds are normal. She exhibits no distension and no mass. There is no abdominal tenderness. There is no rebound and no guarding.  Musculoskeletal: Normal range of motion.        General: Edema present. No tenderness.  Neurological: She is alert and oriented to person, place, and time.  Skin: Skin is warm.  Multiple bruises noted.  Positive for mild bleeding around the site of the PICC line insertion.  Multiple abdominal wall bruises noted.  Psychiatric: Affect normal.  ;   LABORATORY DATA:  I have reviewed the data as listed Lab Results  Component Value Date   WBC 3.3  (L) 01/03/2019   HGB 8.5 (L) 01/03/2019   HCT 26.5 (L) 01/03/2019   MCV 76.4 (L) 01/03/2019   PLT 25 (LL) 01/03/2019   Recent Labs    11/25/18 0904 11/25/18 1219  12/17/18 0855 12/19/18 0919 01/03/19 0858  NA 137 138   < > 136 137 135  K 3.7 3.8   < >  4.0 3.7 3.7  CL 99 101   < > 101 102 100  CO2 27 30   < > '26 25 24  '$ GLUCOSE 159* 110*   < > 132* 130* 152*  BUN 24* 23   < > 33* 27* 21  CREATININE 0.90 0.88   < > 0.83 0.57 0.59  CALCIUM 10.1 10.5*   < > 9.1 9.3 8.9  GFRNONAA 60* >60   < > >60 >60 >60  GFRAA >60 >60   < > >60 >60 >60  PROT 7.0 7.4  --   --   --  7.0  ALBUMIN 4.1 4.3  --   --   --  3.6  AST 37 39  --   --   --  19  ALT 13 13  --   --   --  12  ALKPHOS 62 66  --   --   --  106  BILITOT 0.5 0.9  --   --   --  0.6   < > = values in this interval not displayed.    RADIOGRAPHIC STUDIES: I have personally reviewed the radiological images as listed and agreed with the findings in the report. Ct Bone Marrow Biopsy & Aspiration  Result Date: 12/24/2018 INDICATION: Acute myeloid leukemia with failed remission EXAM: CT-GUIDED BONE MARROW ASPIRATION AND BIOPSY MEDICATIONS: None. ANESTHESIA/SEDATION: Moderate (conscious) sedation was employed during this procedure. A total of Versed 1 mg and Fentanyl 25 mcg was administered intravenously. Moderate Sedation Time: 16 minutes. The patient's level of consciousness and vital signs were monitored continuously by radiology nursing throughout the procedure under my direct supervision. FLUOROSCOPY TIME:  Not applicable COMPLICATIONS: None immediate. PROCEDURE: Informed written consent was obtained from the patient after a thorough discussion of the procedural risks, benefits and alternatives. All questions were addressed. Maximal Sterile Barrier Technique was utilized including caps, mask, sterile gowns, sterile gloves, sterile drape, hand hygiene and skin antiseptic. A timeout was performed prior to the initiation of the procedure.  Utilizing 0.25% Marcaine as a local and deep periosteal anesthetic and CT guidance, an Arrow OnControl bone biopsy needle was placed percutaneously into the left iliac bone. Initial aspiration samples were obtained. Subsequently 2 core biopsy passes were obtained and sent for pathologic evaluation. These were deemed adequate. The needle was removed. Hemostasis was obtained at the puncture site. Patient tolerated the procedure well and was returned to her room in satisfactory condition. IMPRESSION: Successful CT-guided bone marrow biopsy and aspiration as described. Electronically Signed   By: Inez Catalina M.D.   On: 12/24/2018 09:41   Mm 3d Screen Breast Bilateral  Result Date: 12/20/2018 CLINICAL DATA:  Screening. EXAM: DIGITAL SCREENING BILATERAL MAMMOGRAM WITH TOMO AND CAD COMPARISON:  Previous exam(s). ACR Breast Density Category b: There are scattered areas of fibroglandular density. FINDINGS: There are no findings suspicious for malignancy. Images were processed with CAD. IMPRESSION: No mammographic evidence of malignancy. A result letter of this screening mammogram will be mailed directly to the patient. RECOMMENDATION: Screening mammogram in one year. (Code:SM-B-01Y) BI-RADS CATEGORY  1: Negative. Electronically Signed   By: Curlene Dolphin M.D.   On: 12/20/2018 11:41    ASSESSMENT & PLAN:   AML (acute myeloid leukemia) with failed remission (HCC) #Acute myeloid leukemia with monocytic differentiation [20% blasts on peripheral blood flow cytometry]. Currently on cycle #1- Vidaza + Venatoclax day # 37 today.   Day #28 bone marrow biopsy-  7/14- BMx- NO BLASTS; positive for dysplastic changes. Currently off  Venatoclax [since 7/17] sec to cytopenias.  Also discussed with [facility]zeri at [facility].  # Today labs show Hb 8.5 platelets 25 ;WBC- 3.3 absolute neutrophil count-1.8.  LDH improving.  Hold growth factor.  #Recommend holding venetoclax for now.  We will plan to initiate Vidaza/venetoclax tentatively  on August 3 rd-again waiting on count recovery.  #Constipation-recommend holding iron tablets.  Continue prune juice.  # Supportive care:  # Continue Levaquin/Noxafil # PICC dressing weekly.   # weight loss: Status post nutrition evaluation stable  #Discussed with patient's husband-at length regarding patient's plan of care/labs etc. -------------------------------------------------------------------------------- # DISPOSITION: print labs   # NO transfusions/ No Granix today # labs-cbc on 7/28/ possible Granix # Follow up with MD- 8/03 cbc/cmp/LDH- HOLD tube/ Vidaza [JSUN 1-5]- Dr.B    Cammie Sickle, MD 01/03/2019 10:03 AM

## 2019-01-03 NOTE — Assessment & Plan Note (Addendum)
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 Currently on cycle #1- Vidaza + Venatoclax day # 37 today.   Day #28 bone marrow biopsy-  7/14- BMx- NO BLASTS; positive for dysplastic changes. Currently off Venatoclax [since 7/17] sec to cytopenias.  Also discussed with [facility]zeri at [facility].  # Today labs show Hb 8.5 platelets 25 ;WBC- 3.3 absolute neutrophil count-1.8.  LDH improving.  Hold growth factor.  #Recommend holding venetoclax for now.  We will plan to initiate Vidaza/venetoclax tentatively on August 3 rd-again waiting on count recovery.  #Constipation-recommend holding iron tablets.  Continue prune juice.  # Supportive care:  # Continue Levaquin/Noxafil # PICC dressing weekly.   # weight loss: Status post nutrition evaluation stable  #Discussed with patient's husband-at length regarding patient's plan of care/labs etc. -------------------------------------------------------------------------------- # DISPOSITION: print labs   # NO transfusions/ No Granix today # labs-cbc on 7/28/ possible Granix # Follow up with MD- 8/03 cbc/cmp/LDH- HOLD tube/ Vidaza [KUVJ 1-5]- Dr.B

## 2019-01-06 ENCOUNTER — Other Ambulatory Visit: Payer: Self-pay

## 2019-01-07 ENCOUNTER — Telehealth: Payer: Self-pay | Admitting: *Deleted

## 2019-01-07 ENCOUNTER — Inpatient Hospital Stay: Payer: Medicare Other

## 2019-01-07 ENCOUNTER — Inpatient Hospital Stay: Payer: Medicare Other | Admitting: *Deleted

## 2019-01-07 ENCOUNTER — Other Ambulatory Visit: Payer: Self-pay

## 2019-01-07 DIAGNOSIS — Z452 Encounter for adjustment and management of vascular access device: Secondary | ICD-10-CM

## 2019-01-07 DIAGNOSIS — C9200 Acute myeloblastic leukemia, not having achieved remission: Secondary | ICD-10-CM

## 2019-01-07 DIAGNOSIS — D469 Myelodysplastic syndrome, unspecified: Secondary | ICD-10-CM

## 2019-01-07 DIAGNOSIS — D649 Anemia, unspecified: Secondary | ICD-10-CM

## 2019-01-07 LAB — CBC WITH DIFFERENTIAL/PLATELET
Abs Immature Granulocytes: 0.06 10*3/uL (ref 0.00–0.07)
Basophils Absolute: 0 10*3/uL (ref 0.0–0.1)
Basophils Relative: 0 %
Eosinophils Absolute: 0 10*3/uL (ref 0.0–0.5)
Eosinophils Relative: 0 %
HCT: 24.1 % — ABNORMAL LOW (ref 36.0–46.0)
Hemoglobin: 7.6 g/dL — ABNORMAL LOW (ref 12.0–15.0)
Immature Granulocytes: 4 %
Lymphocytes Relative: 43 %
Lymphs Abs: 0.6 10*3/uL — ABNORMAL LOW (ref 0.7–4.0)
MCH: 24.1 pg — ABNORMAL LOW (ref 26.0–34.0)
MCHC: 31.5 g/dL (ref 30.0–36.0)
MCV: 76.3 fL — ABNORMAL LOW (ref 80.0–100.0)
Monocytes Absolute: 0.4 10*3/uL (ref 0.1–1.0)
Monocytes Relative: 27 %
Neutro Abs: 0.4 10*3/uL — ABNORMAL LOW (ref 1.7–7.7)
Neutrophils Relative %: 26 %
Platelets: 26 10*3/uL — CL (ref 150–400)
RBC: 3.16 MIL/uL — ABNORMAL LOW (ref 3.87–5.11)
RDW: 21.2 % — ABNORMAL HIGH (ref 11.5–15.5)
WBC: 1.5 10*3/uL — ABNORMAL LOW (ref 4.0–10.5)
nRBC: 2 % — ABNORMAL HIGH (ref 0.0–0.2)

## 2019-01-07 LAB — SAMPLE TO BLOOD BANK

## 2019-01-07 MED ORDER — HEPARIN SOD (PORK) LOCK FLUSH 100 UNIT/ML IV SOLN
500.0000 [IU] | Freq: Once | INTRAVENOUS | Status: AC
  Administered 2019-01-07: 12:00:00 300 [IU] via INTRAVENOUS

## 2019-01-07 MED ORDER — SODIUM CHLORIDE 0.9% FLUSH
10.0000 mL | Freq: Once | INTRAVENOUS | Status: AC
  Administered 2019-01-07: 10 mL via INTRAVENOUS
  Filled 2019-01-07: qty 10

## 2019-01-07 MED ORDER — FILGRASTIM-AAFI 300 MCG/0.5ML IJ SOSY
300.0000 ug | PREFILLED_SYRINGE | Freq: Once | INTRAMUSCULAR | Status: AC
  Administered 2019-01-07: 300 ug via SUBCUTANEOUS
  Filled 2019-01-07: qty 0.5

## 2019-01-07 NOTE — Telephone Encounter (Signed)
Patient here for lab/nivestym injection.  Critical anc of 0.4  And plt count of 26. hgb 7.6 today. Read back process performed with [facility] in [facility] lab and Dr. Rogue Bussing made aware of results.   md requested patient to have an additional apt for labnivestym/possible 1 unit of blood this Friday 7/31. msg sent to scheduling to arrange.

## 2019-01-10 ENCOUNTER — Inpatient Hospital Stay: Payer: Medicare Other

## 2019-01-10 ENCOUNTER — Telehealth: Payer: Self-pay | Admitting: Internal Medicine

## 2019-01-10 ENCOUNTER — Inpatient Hospital Stay
Admission: EM | Admit: 2019-01-10 | Discharge: 2019-01-14 | DRG: 085 | Disposition: A | Discharge status: Skilled Nursing Facility | Payer: Medicare Other | Attending: Internal Medicine | Admitting: Internal Medicine

## 2019-01-10 ENCOUNTER — Emergency Department: Payer: Medicare Other

## 2019-01-10 ENCOUNTER — Other Ambulatory Visit: Payer: Self-pay

## 2019-01-10 ENCOUNTER — Encounter: Payer: Self-pay | Admitting: Emergency Medicine

## 2019-01-10 ENCOUNTER — Other Ambulatory Visit: Payer: Self-pay | Admitting: Internal Medicine

## 2019-01-10 DIAGNOSIS — D509 Iron deficiency anemia, unspecified: Secondary | ICD-10-CM | POA: Diagnosis present

## 2019-01-10 DIAGNOSIS — D6959 Other secondary thrombocytopenia: Secondary | ICD-10-CM | POA: Diagnosis present

## 2019-01-10 DIAGNOSIS — T451X5A Adverse effect of antineoplastic and immunosuppressive drugs, initial encounter: Secondary | ICD-10-CM

## 2019-01-10 DIAGNOSIS — D62 Acute posthemorrhagic anemia: Secondary | ICD-10-CM | POA: Diagnosis present

## 2019-01-10 DIAGNOSIS — S2241XD Multiple fractures of ribs, right side, subsequent encounter for fracture with routine healing: Secondary | ICD-10-CM

## 2019-01-10 DIAGNOSIS — D649 Anemia, unspecified: Secondary | ICD-10-CM | POA: Diagnosis not present

## 2019-01-10 DIAGNOSIS — D696 Thrombocytopenia, unspecified: Secondary | ICD-10-CM

## 2019-01-10 DIAGNOSIS — J9811 Atelectasis: Secondary | ICD-10-CM | POA: Diagnosis present

## 2019-01-10 DIAGNOSIS — D701 Agranulocytosis secondary to cancer chemotherapy: Secondary | ICD-10-CM | POA: Diagnosis present

## 2019-01-10 DIAGNOSIS — C9200 Acute myeloblastic leukemia, not having achieved remission: Secondary | ICD-10-CM | POA: Diagnosis present

## 2019-01-10 DIAGNOSIS — S42291A Other displaced fracture of upper end of right humerus, initial encounter for closed fracture: Secondary | ICD-10-CM | POA: Diagnosis present

## 2019-01-10 DIAGNOSIS — S2241XA Multiple fractures of ribs, right side, initial encounter for closed fracture: Secondary | ICD-10-CM

## 2019-01-10 DIAGNOSIS — S066X0A Traumatic subarachnoid hemorrhage without loss of consciousness, initial encounter: Secondary | ICD-10-CM | POA: Diagnosis present

## 2019-01-10 DIAGNOSIS — Z8249 Family history of ischemic heart disease and other diseases of the circulatory system: Secondary | ICD-10-CM | POA: Diagnosis not present

## 2019-01-10 DIAGNOSIS — J9601 Acute respiratory failure with hypoxia: Secondary | ICD-10-CM | POA: Diagnosis not present

## 2019-01-10 DIAGNOSIS — R0902 Hypoxemia: Secondary | ICD-10-CM | POA: Diagnosis not present

## 2019-01-10 DIAGNOSIS — D61818 Other pancytopenia: Secondary | ICD-10-CM | POA: Diagnosis not present

## 2019-01-10 DIAGNOSIS — I609 Nontraumatic subarachnoid hemorrhage, unspecified: Secondary | ICD-10-CM

## 2019-01-10 DIAGNOSIS — Z853 Personal history of malignant neoplasm of breast: Secondary | ICD-10-CM

## 2019-01-10 DIAGNOSIS — T148XXA Other injury of unspecified body region, initial encounter: Secondary | ICD-10-CM | POA: Diagnosis not present

## 2019-01-10 DIAGNOSIS — I619 Nontraumatic intracerebral hemorrhage, unspecified: Secondary | ICD-10-CM | POA: Diagnosis not present

## 2019-01-10 DIAGNOSIS — I10 Essential (primary) hypertension: Secondary | ICD-10-CM | POA: Diagnosis present

## 2019-01-10 DIAGNOSIS — S42201A Unspecified fracture of upper end of right humerus, initial encounter for closed fracture: Secondary | ICD-10-CM

## 2019-01-10 DIAGNOSIS — Z923 Personal history of irradiation: Secondary | ICD-10-CM | POA: Diagnosis not present

## 2019-01-10 DIAGNOSIS — Z20828 Contact with and (suspected) exposure to other viral communicable diseases: Secondary | ICD-10-CM | POA: Diagnosis present

## 2019-01-10 DIAGNOSIS — E782 Mixed hyperlipidemia: Secondary | ICD-10-CM | POA: Diagnosis present

## 2019-01-10 DIAGNOSIS — W010XXA Fall on same level from slipping, tripping and stumbling without subsequent striking against object, initial encounter: Secondary | ICD-10-CM | POA: Diagnosis present

## 2019-01-10 DIAGNOSIS — W19XXXA Unspecified fall, initial encounter: Secondary | ICD-10-CM

## 2019-01-10 DIAGNOSIS — Z823 Family history of stroke: Secondary | ICD-10-CM | POA: Diagnosis not present

## 2019-01-10 DIAGNOSIS — Y92002 Bathroom of unspecified non-institutional (private) residence single-family (private) house as the place of occurrence of the external cause: Secondary | ICD-10-CM

## 2019-01-10 LAB — CBC WITH DIFFERENTIAL/PLATELET
Abs Immature Granulocytes: 0.09 10*3/uL — ABNORMAL HIGH (ref 0.00–0.07)
Basophils Absolute: 0 10*3/uL (ref 0.0–0.1)
Basophils Relative: 0 %
Eosinophils Absolute: 0 10*3/uL (ref 0.0–0.5)
Eosinophils Relative: 0 %
HCT: 23.4 % — ABNORMAL LOW (ref 36.0–46.0)
Hemoglobin: 7.4 g/dL — ABNORMAL LOW (ref 12.0–15.0)
Immature Granulocytes: 4 %
Lymphocytes Relative: 26 %
Lymphs Abs: 0.6 10*3/uL — ABNORMAL LOW (ref 0.7–4.0)
MCH: 24.3 pg — ABNORMAL LOW (ref 26.0–34.0)
MCHC: 31.6 g/dL (ref 30.0–36.0)
MCV: 76.7 fL — ABNORMAL LOW (ref 80.0–100.0)
Monocytes Absolute: 0.4 10*3/uL (ref 0.1–1.0)
Monocytes Relative: 17 %
Neutro Abs: 1.2 10*3/uL — ABNORMAL LOW (ref 1.7–7.7)
Neutrophils Relative %: 53 %
Platelets: 30 10*3/uL — ABNORMAL LOW (ref 150–400)
RBC: 3.05 MIL/uL — ABNORMAL LOW (ref 3.87–5.11)
RDW: 21.5 % — ABNORMAL HIGH (ref 11.5–15.5)
WBC: 2.3 10*3/uL — ABNORMAL LOW (ref 4.0–10.5)
nRBC: 1.3 % — ABNORMAL HIGH (ref 0.0–0.2)

## 2019-01-10 LAB — COMPREHENSIVE METABOLIC PANEL
ALT: 16 U/L (ref 0–44)
AST: 29 U/L (ref 15–41)
Albumin: 3.3 g/dL — ABNORMAL LOW (ref 3.5–5.0)
Alkaline Phosphatase: 89 U/L (ref 38–126)
Anion gap: 9 (ref 5–15)
BUN: 19 mg/dL (ref 8–23)
CO2: 25 mmol/L (ref 22–32)
Calcium: 8.7 mg/dL — ABNORMAL LOW (ref 8.9–10.3)
Chloride: 103 mmol/L (ref 98–111)
Creatinine, Ser: 0.54 mg/dL (ref 0.44–1.00)
GFR calc Af Amer: >60 mL/min (ref >60)
GFR calc non Af Amer: >60 mL/min (ref >60)
Glucose, Bld: 137 mg/dL — ABNORMAL HIGH (ref 70–99)
Potassium: 3.5 mmol/L (ref 3.5–5.1)
Sodium: 137 mmol/L (ref 135–145)
Total Bilirubin: 0.8 mg/dL (ref 0.3–1.2)
Total Protein: 6.5 g/dL (ref 6.5–8.1)

## 2019-01-10 LAB — APTT: aPTT: 38 seconds — ABNORMAL HIGH (ref 24–36)

## 2019-01-10 LAB — PROTIME-INR
INR: 1.2 (ref 0.8–1.2)
Prothrombin Time: 14.6 seconds (ref 11.4–15.2)

## 2019-01-10 LAB — SARS CORONAVIRUS 2 BY RT PCR (HOSPITAL ORDER, PERFORMED IN ~~LOC~~ HOSPITAL LAB): SARS Coronavirus 2: NEGATIVE

## 2019-01-10 MED ORDER — CALCIUM CARBONATE ANTACID 500 MG PO CHEW
600.0000 mg | CHEWABLE_TABLET | Freq: Every day | ORAL | Status: DC
  Administered 2019-01-13 – 2019-01-14 (×2): 600 mg via ORAL
  Filled 2019-01-10 (×3): qty 3

## 2019-01-10 MED ORDER — MORPHINE SULFATE (PF) 2 MG/ML IV SOLN
1.0000 mg | Freq: Once | INTRAVENOUS | Status: AC
  Administered 2019-01-10: 1 mg via INTRAVENOUS
  Filled 2019-01-10: qty 1

## 2019-01-10 MED ORDER — VITAMIN C 500 MG PO TABS
500.0000 mg | ORAL_TABLET | Freq: Two times a day (BID) | ORAL | Status: DC
  Administered 2019-01-10 – 2019-01-14 (×5): 500 mg via ORAL
  Filled 2019-01-10 (×8): qty 1

## 2019-01-10 MED ORDER — ONDANSETRON HCL 4 MG/2ML IJ SOLN
4.0000 mg | Freq: Four times a day (QID) | INTRAMUSCULAR | Status: DC | PRN
  Administered 2019-01-12: 4 mg via INTRAVENOUS
  Filled 2019-01-10: qty 2

## 2019-01-10 MED ORDER — LEVETIRACETAM 250 MG PO TABS
250.0000 mg | ORAL_TABLET | Freq: Every morning | ORAL | Status: DC
  Administered 2019-01-10 – 2019-01-14 (×5): 250 mg via ORAL
  Filled 2019-01-10 (×5): qty 1

## 2019-01-10 MED ORDER — ACETAMINOPHEN 650 MG RE SUPP: 650.0000 mg | Freq: Four times a day (QID) | RECTAL | Status: DC | PRN

## 2019-01-10 MED ORDER — HYDROCODONE-ACETAMINOPHEN 5-325 MG PO TABS
1.0000 | ORAL_TABLET | ORAL | Status: DC | PRN
  Administered 2019-01-11: 1 via ORAL
  Filled 2019-01-10: qty 1

## 2019-01-10 MED ORDER — ACYCLOVIR 200 MG PO CAPS
400.0000 mg | ORAL_CAPSULE | Freq: Every day | ORAL | Status: DC
  Administered 2019-01-10 – 2019-01-14 (×5): 400 mg via ORAL
  Filled 2019-01-10 (×5): qty 2

## 2019-01-10 MED ORDER — VENETOCLAX 100 MG PO TABS: 200.0000 mg | ORAL_TABLET | Freq: Every day | ORAL | Status: DC

## 2019-01-10 MED ORDER — ONDANSETRON HCL 4 MG/2ML IJ SOLN
4.0000 mg | Freq: Once | INTRAMUSCULAR | Status: AC
  Administered 2019-01-10: 4 mg via INTRAVENOUS
  Filled 2019-01-10: qty 2

## 2019-01-10 MED ORDER — SODIUM CHLORIDE 0.9 % IV SOLN
10.0000 mL/h | Freq: Once | INTRAVENOUS | Status: AC
  Administered 2019-01-10: 10 mL/h via INTRAVENOUS

## 2019-01-10 MED ORDER — ATENOLOL 25 MG PO TABS
50.0000 mg | ORAL_TABLET | Freq: Two times a day (BID) | ORAL | Status: DC
  Administered 2019-01-10 – 2019-01-13 (×7): 50 mg via ORAL
  Filled 2019-01-10 (×8): qty 2

## 2019-01-10 MED ORDER — SODIUM CHLORIDE 0.9 % IV SOLN
INTRAVENOUS | Status: DC
  Administered 2019-01-10 – 2019-01-11 (×2): via INTRAVENOUS

## 2019-01-10 MED ORDER — ONDANSETRON HCL 4 MG PO TABS: 4.0000 mg | ORAL_TABLET | Freq: Four times a day (QID) | ORAL | Status: DC | PRN

## 2019-01-10 MED ORDER — ACETAMINOPHEN 325 MG PO TABS
650.0000 mg | ORAL_TABLET | Freq: Four times a day (QID) | ORAL | Status: DC | PRN
  Administered 2019-01-11 – 2019-01-14 (×4): 650 mg via ORAL
  Filled 2019-01-10 (×4): qty 2

## 2019-01-10 MED ORDER — MORPHINE SULFATE (PF) 2 MG/ML IV SOLN
2.0000 mg | Freq: Once | INTRAVENOUS | Status: AC
  Administered 2019-01-10: 2 mg via INTRAVENOUS
  Filled 2019-01-10: qty 1

## 2019-01-10 MED ORDER — VITAMIN D3 25 MCG (1000 UNIT) PO TABS
1000.0000 [IU] | ORAL_TABLET | Freq: Every day | ORAL | Status: DC
  Administered 2019-01-10 – 2019-01-14 (×4): 1000 [IU] via ORAL
  Filled 2019-01-10 (×9): qty 1

## 2019-01-10 NOTE — ED Notes (Signed)
Assisted pt to bathroom w/ 2x assist

## 2019-01-10 NOTE — ED Notes (Signed)
ED TO INPATIENT HANDOFF REPORT  ED Nurse Name and Phone #: [phone]   S Name/Age/Gender Kathryn Hahn 85 y.o. female Room/Bed: ED02A/ED02A  Code Status   Code Status: Prior  Home/SNF/Other Home Patient oriented to: self, place, time and situation Is this baseline? Yes   Triage Complete: Triage complete  Chief Complaint Fall   Triage Note Pt ems from [facility] s/p fall mechanical pt c/o of rt shoulder pain.    Allergies Allergies  Allergen Reactions  . Terbinafine Rash and Swelling    Level of Care/Admitting Diagnosis ED Disposition    ED Disposition Condition [facility]: [place] [[id number]]  Level of Care: Med-Surg [16]  Covid Evaluation: Confirmed COVID Negative  Diagnosis: SAH (subarachnoid hemorrhage) [facility]) [[id number]]  Admitting Physician: Dustin Flock [[id number]]  Attending Physician: Dustin Flock [phone]  Estimated length of stay: past midnight tomorrow  Certification:: I certify this patient will need inpatient services for at least 2 midnights  PT Class (Do Not Modify): Inpatient [101]  PT Acc Code (Do Not Modify): Private [1]       B Medical/Surgery History Past Medical History:  Diagnosis Date  . Anemia   . Arthritis   . Breast cancer ([place]) 2003   RT LUMPECTOMY  . Edema leg   . History of breast cancer 2003   post lumpectomy  . History of kidney stones   . Hyperlipemia, mixed   . Hypertension, essential   . Leukemia ([place])   . Leukocytosis   . MDS (myelodysplastic syndrome) ([place])   . Personal history of radiation therapy 2003   BREAST CA  . Renal stones   . Vitamin D deficiency    Past Surgical History:  Procedure Laterality Date  . BREAST BIOPSY Right 2003   Postive for cancer  . BREAST LUMPECTOMY Right 2003   BREAST CA  . KIDNEY STONE SURGERY Right      A IV Location/Drains/Wounds Patient Lines/Drains/Airways Status   Active Line/Drains/Airways    Name:    Placement date:   Placement time:   Site:   Days:   Peripheral IV 01/10/19 Anterior;Distal;Left Forearm   01/10/19    0735    Forearm   less than 1   PICC Single Lumen 12/02/18 PICC Left Brachial 36 cm 0 cm   12/02/18    1037    Brachial   39          Intake/Output Last 24 hours No intake or output data in the 24 hours ending 01/10/19 1654  Labs/Imaging Results for orders placed or performed during the hospital encounter of 01/10/19 (from the past 48 hour(s))  Comprehensive metabolic panel     Status: Abnormal   Collection Time: 01/10/19  8:08 AM  Result Value Ref Range   Sodium 137 135 - 145 mmol/L   Potassium 3.5 3.5 - 5.1 mmol/L   Chloride 103 98 - 111 mmol/L   CO2 25 22 - 32 mmol/L   Glucose, Bld 137 (H) 70 - 99 mg/dL   BUN 19 8 - 23 mg/dL   Creatinine, Ser 0.54 0.44 - 1.00 mg/dL   Calcium 8.7 (L) 8.9 - 10.3 mg/dL   Total Protein 6.5 6.5 - 8.1 g/dL   Albumin 3.3 (L) 3.5 - 5.0 g/dL   AST 29 15 - 41 U/L   ALT 16 0 - 44 U/L   Alkaline Phosphatase 89 38 - 126 U/L   Total Bilirubin 0.8 0.3 - 1.2 mg/dL  GFR calc non Af Amer >60 >60 mL/min   GFR calc Af Amer >60 >60 mL/min   Anion gap 9 5 - 15    Comment: Performed at [facility], [address] [place], [place] [address]  CBC with Differential     Status: Abnormal   Collection Time: 01/10/19  8:08 AM  Result Value Ref Range   WBC 2.3 (L) 4.0 - 10.5 K/uL   RBC 3.05 (L) 3.87 - 5.11 MIL/uL   Hemoglobin 7.4 (L) 12.0 - 15.0 g/dL    Comment: Reticulocyte Hemoglobin testing may be clinically indicated, consider ordering this additional test [id number]    HCT 23.4 (L) 36.0 - 46.0 %   MCV 76.7 (L) 80.0 - 100.0 fL   MCH 24.3 (L) 26.0 - 34.0 pg   MCHC 31.6 30.0 - 36.0 g/dL   RDW 21.5 (H) 11.5 - 15.5 %   Platelets 30 (L) 150 - 400 K/uL    Comment: Immature Platelet Fraction may be clinically indicated, consider ordering this additional test [id number]    nRBC 1.3 (H) 0.0 - 0.2 %   Neutrophils Relative % 53 %    Neutro Abs 1.2 (L) 1.7 - 7.7 K/uL   Lymphocytes Relative 26 %   Lymphs Abs 0.6 (L) 0.7 - 4.0 K/uL   Monocytes Relative 17 %   Monocytes Absolute 0.4 0.1 - 1.0 K/uL   Eosinophils Relative 0 %   Eosinophils Absolute 0.0 0.0 - 0.5 K/uL   Basophils Relative 0 %   Basophils Absolute 0.0 0.0 - 0.1 K/uL   RBC Morphology MIXED RBC POPULATION    Smear Review PLATELET COUNT CONFIRMED BY SMEAR    Immature Granulocytes 4 %   Abs Immature Granulocytes 0.09 (H) 0.00 - 0.07 K/uL   Schistocytes PRESENT    Tear Drop Cells PRESENT    Polychromasia PRESENT    Ovalocytes PRESENT     Comment: Performed at [facility], [address] [place], [place] [address]  SARS Coronavirus 2 (CEPHEID - Performed in [place] hospital lab), Hosp Order     Status: None   Collection Time: 01/10/19  9:54 AM   Specimen: Nasopharyngeal Swab  Result Value Ref Range   SARS Coronavirus 2 NEGATIVE NEGATIVE    Comment: (NOTE) If result is NEGATIVE SARS-CoV-2 target nucleic acids are NOT DETECTED. The SARS-CoV-2 RNA is generally detectable in upper and lower  respiratory specimens during the acute phase of infection. The lowest  concentration of SARS-CoV-2 viral copies this assay can detect is 250  copies / mL. A negative result does not preclude SARS-CoV-2 infection  and should not be used as the sole basis for treatment or other  patient management decisions.  A negative result may occur with  improper specimen collection / handling, submission of specimen other  than nasopharyngeal swab, presence of viral mutation(s) within the  areas targeted by this assay, and inadequate number of viral copies  (<250 copies / mL). A negative result must be combined with clinical  observations, patient history, and epidemiological information. If result is POSITIVE SARS-CoV-2 target nucleic acids are DETECTED. The SARS-CoV-2 RNA is generally detectable in upper and lower  respiratory specimens dur ing the acute phase of  infection.  Positive  results are indicative of active infection with SARS-CoV-2.  Clinical  correlation with patient history and other diagnostic information is  necessary to determine patient infection status.  Positive results do  not rule out bacterial infection or co-infection with other viruses.  If result is PRESUMPTIVE POSTIVE SARS-CoV-2 nucleic acids MAY BE PRESENT.   A presumptive positive result was obtained on the submitted specimen  and confirmed on repeat testing.  While 2019 novel coronavirus  (SARS-CoV-2) nucleic acids may be present in the submitted sample  additional confirmatory testing may be necessary for epidemiological  and / or clinical management purposes  to differentiate between  SARS-CoV-2 and other Sarbecovirus currently known to infect humans.  If clinically indicated additional testing with an alternate test  methodology [phone]) is advised. The SARS-CoV-2 RNA is generally  detectable in upper and lower respiratory sp ecimens during the acute  phase of infection. The expected result is Negative. Fact Sheet for Patients:  StrictlyIdeas.no Fact Sheet for Healthcare Providers: BankingDealers.co.za This test is not yet approved or cleared by the [place] FDA and has been authorized for detection and/or diagnosis of SARS-CoV-2 by FDA under an Emergency Use Authorization (EUA).  This EUA will remain in effect (meaning this test can be used) for the duration of the COVID-19 declaration under Section 564(b)(1) of the Act, 21 U.S.C. section 360bbb-3(b)(1), unless the authorization is terminated or revoked sooner. Performed at [facility], [address] [place], [place] [phone]   Prepare Pheresed Platelets     Status: None (Preliminary result)   Collection Time: 01/10/19  1:53 PM  Result Value Ref Range   Unit Number [id number]    Blood Component Type PLTP LI1 PAS    Unit division 00     Status of Unit ISSUED    Transfusion Status      OK TO TRANSFUSE Performed at [facility], [address] [place], [place] [address]   Protime-INR     Status: None   Collection Time: 01/10/19  2:18 PM  Result Value Ref Range   Prothrombin Time 14.6 11.4 - 15.2 seconds   INR 1.2 0.8 - 1.2    Comment: (NOTE) INR goal varies based on device and disease states. Performed at [facility], [address] [place], [place] [address]   APTT     Status: Abnormal   Collection Time: 01/10/19  2:18 PM  Result Value Ref Range   aPTT 38 (H) 24 - 36 seconds    Comment:        IF BASELINE aPTT IS ELEVATED, SUGGEST PATIENT RISK ASSESSMENT BE USED TO DETERMINE APPROPRIATE ANTICOAGULANT THERAPY. Performed at [facility], [address] [place], [place] [address]    Dg Chest 2 View  Result Date: 01/10/2019 CLINICAL DATA:  RIGHT chest pain following fall.  Initial encounter. EXAM: CHEST - 2 VIEW COMPARISON:  None. FINDINGS: Cardiomegaly noted. A LEFT PICC line with tip overlying the LOWER SVC noted. Mild interstitial opacities bilaterally are identified. Trace pleural effusions are noted. No focal airspace disease or pneumothorax. Fractures of the RIGHT 5th through 8th ribs noted. Mildly impacted RIGHT humeral neck fracture extending into the head is identified. RIGHT breast/axillary surgical clips noted. IMPRESSION: 1. RIGHT 5th through 8th rib fractures. No evidence of pneumothorax. 2. RIGHT humeral neck/head fracture. 3. Cardiomegaly with mild interstitial opacities of uncertain chronicity. With trace bilateral pleural effusions, these interstitial opacities may represent mild interstitial edema. Electronically Signed   By: Margarette Canada M.D.   On: 01/10/2019 08:43   Dg Shoulder Right  Result Date: 01/10/2019 CLINICAL DATA:  Acute RIGHT shoulder pain following fall. Initial encounter. EXAM: RIGHT SHOULDER - 2+ VIEW COMPARISON:  None. FINDINGS: A mildly impacted humeral  neck fracture is  noted extending into the head and involving the greater tuberosity. No shoulder dislocation. Fractures of the RIGHT 5th through 8th ribs noted.  No pneumothorax. IMPRESSION: 1. Mildly impacted humeral neck fracture extending into the head and greater tuberosity. 2. RIGHT 5th through 8th rib fractures.  No pneumothorax. Electronically Signed   By: Margarette Canada M.D.   On: 01/10/2019 08:46   Ct Head Wo Contrast  Result Date: 01/10/2019 CLINICAL DATA:  Per neurosurgery repeat CT scan of the head in 6 hours. Pt ems from [facility] s/p fall mechanical pt c/o of rt shoulder pain. EXAM: CT HEAD WITHOUT CONTRAST TECHNIQUE: Contiguous axial images were obtained from the base of the skull through the vertex without intravenous contrast. COMPARISON:  01/10/2019 at 8:30 a.m. FINDINGS: Brain: No change in the small focus of hemorrhage noted along the anterior right frontal lobe. No new intracranial hemorrhage. No hydrocephalus or evidence of an ischemic infarct. Atrophy and mild chronic microvascular ischemic change is stable from the earlier exam. Vascular: No hyperdense vessel or unexpected calcification. Skull: Normal. Negative for fracture or focal lesion. Sinuses/Orbits: No acute finding. Other: None. IMPRESSION: 1. No change from the study obtained earlier today. 2. Trace posttraumatic hemorrhage along the anterior right frontal lobe. No new hemorrhage. Electronically Signed   By: Lajean Manes M.D.   On: 01/10/2019 15:30   Ct Head Wo Contrast  Addendum Date: 01/10/2019   ADDENDUM REPORT: 01/10/2019 09:31 ADDENDUM: Study discussed by telephone with Dr. Conni Slipper on 01/10/2019 at 0921 hours. Electronically Signed   By: Genevie Ann M.D.   On: 01/10/2019 09:31   Result Date: 01/10/2019 CLINICAL DATA:  85 year old female status post fall this morning. Right orbits/frontal injury. Neck pain. EXAM: CT HEAD WITHOUT CONTRAST CT CERVICAL SPINE WITHOUT CONTRAST TECHNIQUE: Multidetector CT imaging of  the head and cervical spine was performed following the standard protocol without intravenous contrast. Multiplanar CT image reconstructions of the cervical spine were also generated. COMPARISON:  CTA head 01/10/2017. CT face 01/09/2017. FINDINGS: CT HEAD FINDINGS Brain: Stable cerebral volume, normal for age. No midline shift, mass effect, or evidence of intracranial mass lesion. No ventriculomegaly. No intraventricular hemorrhage identified. Trace acute hemorrhage along the right anterior frontal convexity on series 2, image 13 appears to be subarachnoid rather than a hemorrhagic cerebral contusion. No other acute intracranial hemorrhage identified. Stable gray-white matter differentiation throughout the brain. No cortically based acute infarct identified. No cortical encephalomalacia. Normal basilar cisterns. Vascular: Calcified atherosclerosis at the skull base. No suspicious intracranial vascular hyperdensity. Skull: Intact. Sinuses/Orbits: Trace to mild paranasal sinus mucosal thickening is new since 2018. Tympanic cavities and mastoids remain clear. Other: Right posterior convexity scalp hematoma measuring up to 4 millimeters in thickness. Underlying calvarium intact. Stable and negative other orbit and scalp soft tissues. CT CERVICAL SPINE FINDINGS Alignment: Reversal of upper cervical lordosis is chronic and appears stable since 2018. Bilateral posterior element alignment is within normal limits. Cervicothoracic junction alignment is within normal limits. Skull base and vertebrae: Visualized skull base is intact. No atlanto-occipital dissociation. Congenital incomplete ossification of the posterior C1 ring. No acute osseous abnormality identified. Soft tissues and spinal canal: No prevertebral fluid or swelling. No visible canal hematoma. Disc levels: Widespread advanced cervical spine disc and endplate degeneration. Progressed left facet degeneration at C2-C3 since 2018. Mild if any associated cervical  spinal stenosis. Upper chest: Grossly intact visible upper thoracic levels. Negative lung apices. IMPRESSION: 1. Positive for trace posttraumatic hemorrhage along the right anterior frontal lobe, favor trace  subarachnoid over small hemorrhagic contusion. 2. No intracranial mass effect and no other acute traumatic injury to the brain identified. 3. Right posterior convexity scalp hematoma without underlying fracture. 4. No acute traumatic injury identified in the cervical spine. Electronically Signed: By: Genevie Ann M.D. On: 01/10/2019 09:09   Ct Cervical Spine Wo Contrast  Addendum Date: 01/10/2019   ADDENDUM REPORT: 01/10/2019 09:31 ADDENDUM: Study discussed by telephone with Dr. Conni Slipper on 01/10/2019 at 0921 hours. Electronically Signed   By: Genevie Ann M.D.   On: 01/10/2019 09:31   Result Date: 01/10/2019 CLINICAL DATA:  85 year old female status post fall this morning. Right orbits/frontal injury. Neck pain. EXAM: CT HEAD WITHOUT CONTRAST CT CERVICAL SPINE WITHOUT CONTRAST TECHNIQUE: Multidetector CT imaging of the head and cervical spine was performed following the standard protocol without intravenous contrast. Multiplanar CT image reconstructions of the cervical spine were also generated. COMPARISON:  CTA head 01/10/2017. CT face 01/09/2017. FINDINGS: CT HEAD FINDINGS Brain: Stable cerebral volume, normal for age. No midline shift, mass effect, or evidence of intracranial mass lesion. No ventriculomegaly. No intraventricular hemorrhage identified. Trace acute hemorrhage along the right anterior frontal convexity on series 2, image 13 appears to be subarachnoid rather than a hemorrhagic cerebral contusion. No other acute intracranial hemorrhage identified. Stable gray-white matter differentiation throughout the brain. No cortically based acute infarct identified. No cortical encephalomalacia. Normal basilar cisterns. Vascular: Calcified atherosclerosis at the skull base. No suspicious intracranial vascular  hyperdensity. Skull: Intact. Sinuses/Orbits: Trace to mild paranasal sinus mucosal thickening is new since 2018. Tympanic cavities and mastoids remain clear. Other: Right posterior convexity scalp hematoma measuring up to 4 millimeters in thickness. Underlying calvarium intact. Stable and negative other orbit and scalp soft tissues. CT CERVICAL SPINE FINDINGS Alignment: Reversal of upper cervical lordosis is chronic and appears stable since 2018. Bilateral posterior element alignment is within normal limits. Cervicothoracic junction alignment is within normal limits. Skull base and vertebrae: Visualized skull base is intact. No atlanto-occipital dissociation. Congenital incomplete ossification of the posterior C1 ring. No acute osseous abnormality identified. Soft tissues and spinal canal: No prevertebral fluid or swelling. No visible canal hematoma. Disc levels: Widespread advanced cervical spine disc and endplate degeneration. Progressed left facet degeneration at C2-C3 since 2018. Mild if any associated cervical spinal stenosis. Upper chest: Grossly intact visible upper thoracic levels. Negative lung apices. IMPRESSION: 1. Positive for trace posttraumatic hemorrhage along the right anterior frontal lobe, favor trace subarachnoid over small hemorrhagic contusion. 2. No intracranial mass effect and no other acute traumatic injury to the brain identified. 3. Right posterior convexity scalp hematoma without underlying fracture. 4. No acute traumatic injury identified in the cervical spine. Electronically Signed: By: Genevie Ann M.D. On: 01/10/2019 09:09   Ct Shoulder Right Wo Contrast  Result Date: 01/10/2019 CLINICAL DATA:  Right shoulder pain after a fall today. Initial encounter. EXAM: CT OF THE UPPER RIGHT EXTREMITY WITHOUT CONTRAST TECHNIQUE: Multidetector CT imaging of the upper right extremity was performed according to the standard protocol. COMPARISON:  Plain films right shoulder earlier today. FINDINGS:  Bones/Joint/Cartilage As seen on the comparison study, the patient has a transverse fracture through the metaphysis of the humerus. The articular surface of the humeral head is rotated medially due to retraction by the rotator cuff. There is mild anterior displacement of the fracture. Nondisplaced component extends into the anterior aspect of the greater tuberosity. The lesser tuberosity is spared. The humeral head is located and the acromioclavicular joint is intact. Also seen are  acute fractures of the posterior arcs of the right fifth, sixth, seventh and eighth ribs. No other fracture is identified. Ligaments Suboptimally assessed by CT. Muscles and Tendons Visualization of the rotator cuff is somewhat limited on CT but no fracture seen. Soft tissues There is some hemorrhage in the subacromial/subdeltoid bursa due to the patient's fracture. Small right pleural effusion is noted. No pneumothorax is identified but the anterior aspect of the right lung is seen only near the apex. Imaged lung parenchyma is clear with the exception of mild dependent atelectasis. IMPRESSION: Transverse fracture through the metaphysis of the humerus with mild impaction includes a nondisplaced component in the anterior aspect of the greater tuberosity. The humeral head is rotated medially due to retraction by the rotator cuff. Acute right fifth through eighth rib fractures with an associated small pleural effusion. No pneumothorax is seen but the anterior margin of the lung is only seen at the apex. Hemorrhage in the subacromial/subdeltoid bursa due to the patient's fracture. Electronically Signed   By: Inge Rise M.D.   On: 01/10/2019 15:34    Pending Labs Unresulted Labs (From admission, onward)   None      Vitals/Pain Today's Vitals   01/10/19 1600 01/10/19 1621 01/10/19 1630 01/10/19 1640  BP: 124/65  (!) 144/67   Pulse: 66  64   Resp: 20  (!) 21   Temp:  98.1 F (36.7 C)  98.3 F (36.8 C)  TempSrc:    Oral   SpO2: 100%  100%   Weight:      Height:      PainSc:        Isolation Precautions No active isolations  Medications Medications  levETIRAcetam (KEPPRA) tablet 250 mg (250 mg Oral Given 01/10/19 1301)  morphine 2 MG/ML injection 2 mg (2 mg Intravenous Given 01/10/19 0754)  ondansetron (ZOFRAN) injection 4 mg (4 mg Intravenous Given 01/10/19 0753)  morphine 2 MG/ML injection 1 mg (1 mg Intravenous Given 01/10/19 1046)  0.9 %  sodium chloride infusion (10 mL/hr Intravenous New Bag/Given 01/10/19 1611)  morphine 2 MG/ML injection 1 mg (1 mg Intravenous Given 01/10/19 1538)    Mobility walks with person assist Moderate fall risk   Focused Assessments s/p fall   R Recommendations: See Admitting Provider Note  Report given to:   Additional Notes:

## 2019-01-10 NOTE — H&P (Signed)
[facility] NAME: Kathryn Hahn    MR#:  [id number]  DATE OF BIRTH:  07-27-1933  DATE OF ADMISSION:  01/10/2019  PRIMARY CARE PHYSICIAN: Rusty Aus, MD   REQUESTING/REFERRING PHYSICIAN: Nena Polio, MD  CHIEF COMPLAINT:   Chief Complaint  Patient presents with  . Shoulder Injury    HISTORY OF PRESENT ILLNESS: Kathryn Hahn  is a 85 y.o. female with a known history of leukemia who is currently being followed by oncology, chronic thrombocytopenia, anemia who currently resides in independent facility who slipped in the bathroom.  Patient's fell and hit her head on the right side.  She also hit her chest as well as right shoulder.  Evaluation in the ED shows a small subarachnoid hemorrhage.  The ER physician spoke to neurosurgery at [facility] who states that patient does not need transfer.  Patient had 2 CT scans 6 hours apart.  Which showed no significant change therefore she was recommended to stay at this hospital.  In the ER patient is receiving platelets due to her hemorrhage.  She is also noticed to have multiple rib fractures on the right ribs and right humeral fracture.  She denies any dizziness no chest pain or palpitations no nausea vomiting or diarrhea. PAST MEDICAL HISTORY:   Past Medical History:  Diagnosis Date  . Anemia   . Arthritis   . Breast cancer ([place]) 2003   RT LUMPECTOMY  . Edema leg   . History of breast cancer 2003   post lumpectomy  . History of kidney stones   . Hyperlipemia, mixed   . Hypertension, essential   . Leukemia ([place])   . Leukocytosis   . MDS (myelodysplastic syndrome) ([place])   . Personal history of radiation therapy 2003   BREAST CA  . Renal stones   . Vitamin D deficiency     PAST SURGICAL HISTORY:  Past Surgical History:  Procedure Laterality Date  . BREAST BIOPSY Right 2003   Postive for cancer  . BREAST LUMPECTOMY Right 2003   BREAST CA  . KIDNEY STONE SURGERY Right      SOCIAL HISTORY:  Social History   Tobacco Use  . Smoking status: Never Smoker  . Smokeless tobacco: Never Used  Substance Use Topics  . Alcohol use: Yes    Alcohol/week: 3.0 standard drinks    Types: 3 Glasses of wine per week    FAMILY HISTORY:  Family History  Problem Relation Age of Onset  . Stroke Mother   . Hypertension Father   . Stroke Father   . Breast cancer Neg Hx     DRUG ALLERGIES:  Allergies  Allergen Reactions  . Terbinafine Rash and Swelling    REVIEW OF SYSTEMS:   CONSTITUTIONAL: No fever, positive fatigue or positive weakness.  EYES: No blurred or double vision.  EARS, NOSE, AND THROAT: No tinnitus or ear pain.  RESPIRATORY: No cough, shortness of breath, wheezing or hemoptysis.  CARDIOVASCULAR: No chest pain, orthopnea, edema.  GASTROINTESTINAL: No nausea, vomiting, diarrhea or abdominal pain.  GENITOURINARY: No dysuria, hematuria.  ENDOCRINE: No polyuria, nocturia,  HEMATOLOGY: No anemia, easy bruising or bleeding SKIN: No rash or lesion.  Right eye bruising MUSCULOSKELETAL: No joint pain or arthritis.   NEUROLOGIC: No tingling, numbness, weakness.  PSYCHIATRY: No anxiety or depression.   MEDICATIONS AT HOME:  Prior to Admission medications   Medication Sig Start Date End Date Taking? Authorizing Provider  acyclovir (ZOVIRAX) 400 MG  tablet One pill a day [to prevent shingles] 12/12/18  Yes Cammie Sickle, MD  atenolol (TENORMIN) 50 MG tablet Take 1 tablet by mouth 2 (two) times daily.   Yes [provider]  calcium carbonate (OSCAL) 1500 (600 Ca) MG TABS tablet Take 600 mg of elemental calcium by mouth daily with breakfast.   Yes [provider]  Cholecalciferol (VITAMIN D) 2000 units tablet Take 1 tablet by mouth daily.   Yes [provider]  levofloxacin (LEVAQUIN) 250 MG tablet Take 1 tablet (250 mg total) by mouth daily. 12/12/18  Yes Cammie Sickle, MD  losartan-hydrochlorothiazide (HYZAAR) 50-12.5  MG tablet Take 1 tablet by mouth daily.   Yes [provider]  magic mouthwash w/lidocaine SOLN Take 5 mLs by mouth 4 (four) times daily as needed for mouth pain. 11/11/18  Yes Cammie Sickle, MD  posaconazole (NOXAFIL) 100 MG TBEC delayed-release tablet Take 2 tablets (200 mg total) by mouth daily. DO NOT START UNTIL-OK with MD. 12/19/18  Yes Cammie Sickle, MD  potassium chloride SA (KLOR-CON M20) 20 MEQ tablet TAKE 1 TABLET BY MOUTH TWICE A DAY Patient taking differently: Take 20 mEq by mouth daily.  12/31/18  Yes Cammie Sickle, MD  venetoclax (VENCLEXTA) 100 MG TABS Take 200 mg by mouth daily. Take with food and water. 12/20/18  Yes Cammie Sickle, MD  vitamin C (ASCORBIC ACID) 500 MG tablet Take 1 tablet by mouth 2 (two) times daily.   Yes [provider]  allopurinol (ZYLOPRIM) 300 MG tablet Take 1 tablet (300 mg total) by mouth 2 (two) times daily. Patient not taking: Reported on 01/10/2019 11/19/18   Cammie Sickle, MD  iron polysaccharides (FERREX 150) 150 MG capsule Take 1 capsule (150 mg total) by mouth daily. Patient not taking: Reported on 01/10/2019 10/10/18   Cammie Sickle, MD      PHYSICAL EXAMINATION:   VITAL SIGNS: Blood pressure 131/72, pulse 63, temperature 97.7 F (36.5 C), temperature source Oral, resp. rate (!) 22, height 4\' 10"  (1.473 m), weight 43.1 kg, SpO2 100 %.  GENERAL:  85 y.o.-year-old patient lying in the bed with no acute distress.  EYES: Pupils equal, round, reactive to light and accommodation. No scleral icterus.  Right eye has some bleeding in the conjunctiva extraocular muscles intact.  HEENT: Head atraumatic, normocephalic. Oropharynx and nasopharynx clear.  NECK:  Supple, no jugular venous distention. No thyroid enlargement, no tenderness.  LUNGS: Normal breath sounds bilaterally, no wheezing, rales,rhonchi or crepitation. No use of accessory muscles of respiration.  CARDIOVASCULAR: S1, S2 normal. No  murmurs, rubs, or gallops.  ABDOMEN: Soft, nontender, nondistended. Bowel sounds present. No organomegaly or mass.  EXTREMITIES: No pedal edema, cyanosis, or clubbing.  NEUROLOGIC: Cranial nerves II through XII are intact. Muscle strength 5/5 in all extremities. Sensation intact. Gait not checked.  PSYCHIATRIC: The patient is alert and oriented x 3.  SKIN: No obvious rash, lesion, or ulcer.   LABORATORY PANEL:   CBC Recent Labs  Lab 01/07/19 1111 01/10/19 0808  WBC 1.5* 2.3*  HGB 7.6* 7.4*  HCT 24.1* 23.4*  PLT 26* 30*  MCV 76.3* 76.7*  MCH 24.1* 24.3*  MCHC 31.5 31.6  RDW 21.2* 21.5*  LYMPHSABS 0.6* 0.6*  MONOABS 0.4 0.4  EOSABS 0.0 0.0  BASOSABS 0.0 0.0   ------------------------------------------------------------------------------------------------------------------  Chemistries  Recent Labs  Lab 01/10/19 0808  NA 137  K 3.5  CL 103  CO2 25  GLUCOSE 137*  BUN  19  CREATININE 0.54  CALCIUM 8.7*  AST 29  ALT 16  ALKPHOS 89  BILITOT 0.8   ------------------------------------------------------------------------------------------------------------------ estimated creatinine clearance is 35 mL/min (by C-G formula based on SCr of 0.54 mg/dL). ------------------------------------------------------------------------------------------------------------------ No results for input(s): TSH, T4TOTAL, T3FREE, THYROIDAB in the last 72 hours.  Invalid input(s): FREET3   Coagulation profile Recent Labs  Lab 01/10/19 1418  INR 1.2   ------------------------------------------------------------------------------------------------------------------- No results for input(s): DDIMER in the last 72 hours. -------------------------------------------------------------------------------------------------------------------  Cardiac Enzymes No results for input(s): CKMB, TROPONINI, MYOGLOBIN in the last 168 hours.  Invalid input(s):  CK ------------------------------------------------------------------------------------------------------------------ Invalid input(s): POCBNP  ---------------------------------------------------------------------------------------------------------------  Urinalysis No results found for: COLORURINE, APPEARANCEUR, LABSPEC, PHURINE, GLUCOSEU, HGBUR, BILIRUBINUR, KETONESUR, PROTEINUR, UROBILINOGEN, NITRITE, LEUKOCYTESUR   RADIOLOGY: Dg Chest 2 View  Result Date: 01/10/2019 CLINICAL DATA:  RIGHT chest pain following fall.  Initial encounter. EXAM: CHEST - 2 VIEW COMPARISON:  None. FINDINGS: Cardiomegaly noted. A LEFT PICC line with tip overlying the LOWER SVC noted. Mild interstitial opacities bilaterally are identified. Trace pleural effusions are noted. No focal airspace disease or pneumothorax. Fractures of the RIGHT 5th through 8th ribs noted. Mildly impacted RIGHT humeral neck fracture extending into the head is identified. RIGHT breast/axillary surgical clips noted. IMPRESSION: 1. RIGHT 5th through 8th rib fractures. No evidence of pneumothorax. 2. RIGHT humeral neck/head fracture. 3. Cardiomegaly with mild interstitial opacities of uncertain chronicity. With trace bilateral pleural effusions, these interstitial opacities may represent mild interstitial edema. Electronically Signed   By: Margarette Canada M.D.   On: 01/10/2019 08:43   Dg Shoulder Right  Result Date: 01/10/2019 CLINICAL DATA:  Acute RIGHT shoulder pain following fall. Initial encounter. EXAM: RIGHT SHOULDER - 2+ VIEW COMPARISON:  None. FINDINGS: A mildly impacted humeral neck fracture is noted extending into the head and involving the greater tuberosity. No shoulder dislocation. Fractures of the RIGHT 5th through 8th ribs noted.  No pneumothorax. IMPRESSION: 1. Mildly impacted humeral neck fracture extending into the head and greater tuberosity. 2. RIGHT 5th through 8th rib fractures.  No pneumothorax. Electronically Signed   By:  Margarette Canada M.D.   On: 01/10/2019 08:46   Ct Head Wo Contrast  Result Date: 01/10/2019 CLINICAL DATA:  Per neurosurgery repeat CT scan of the head in 6 hours. Pt ems from [facility] s/p fall mechanical pt c/o of rt shoulder pain. EXAM: CT HEAD WITHOUT CONTRAST TECHNIQUE: Contiguous axial images were obtained from the base of the skull through the vertex without intravenous contrast. COMPARISON:  01/10/2019 at 8:30 a.m. FINDINGS: Brain: No change in the small focus of hemorrhage noted along the anterior right frontal lobe. No new intracranial hemorrhage. No hydrocephalus or evidence of an ischemic infarct. Atrophy and mild chronic microvascular ischemic change is stable from the earlier exam. Vascular: No hyperdense vessel or unexpected calcification. Skull: Normal. Negative for fracture or focal lesion. Sinuses/Orbits: No acute finding. Other: None. IMPRESSION: 1. No change from the study obtained earlier today. 2. Trace posttraumatic hemorrhage along the anterior right frontal lobe. No new hemorrhage. Electronically Signed   By: Lajean Manes M.D.   On: 01/10/2019 15:30   Ct Head Wo Contrast  Addendum Date: 01/10/2019   ADDENDUM REPORT: 01/10/2019 09:31 ADDENDUM: Study discussed by telephone with Dr. Conni Slipper on 01/10/2019 at 0921 hours. Electronically Signed   By: Genevie Ann M.D.   On: 01/10/2019 09:31   Result Date: 01/10/2019 CLINICAL DATA:  85 year old female status post fall this morning. Right orbits/frontal injury. Neck pain. EXAM: CT  HEAD WITHOUT CONTRAST CT CERVICAL SPINE WITHOUT CONTRAST TECHNIQUE: Multidetector CT imaging of the head and cervical spine was performed following the standard protocol without intravenous contrast. Multiplanar CT image reconstructions of the cervical spine were also generated. COMPARISON:  CTA head 01/10/2017. CT face 01/09/2017. FINDINGS: CT HEAD FINDINGS Brain: Stable cerebral volume, normal for age. No midline shift, mass effect, or evidence of  intracranial mass lesion. No ventriculomegaly. No intraventricular hemorrhage identified. Trace acute hemorrhage along the right anterior frontal convexity on series 2, image 13 appears to be subarachnoid rather than a hemorrhagic cerebral contusion. No other acute intracranial hemorrhage identified. Stable gray-white matter differentiation throughout the brain. No cortically based acute infarct identified. No cortical encephalomalacia. Normal basilar cisterns. Vascular: Calcified atherosclerosis at the skull base. No suspicious intracranial vascular hyperdensity. Skull: Intact. Sinuses/Orbits: Trace to mild paranasal sinus mucosal thickening is new since 2018. Tympanic cavities and mastoids remain clear. Other: Right posterior convexity scalp hematoma measuring up to 4 millimeters in thickness. Underlying calvarium intact. Stable and negative other orbit and scalp soft tissues. CT CERVICAL SPINE FINDINGS Alignment: Reversal of upper cervical lordosis is chronic and appears stable since 2018. Bilateral posterior element alignment is within normal limits. Cervicothoracic junction alignment is within normal limits. Skull base and vertebrae: Visualized skull base is intact. No atlanto-occipital dissociation. Congenital incomplete ossification of the posterior C1 ring. No acute osseous abnormality identified. Soft tissues and spinal canal: No prevertebral fluid or swelling. No visible canal hematoma. Disc levels: Widespread advanced cervical spine disc and endplate degeneration. Progressed left facet degeneration at C2-C3 since 2018. Mild if any associated cervical spinal stenosis. Upper chest: Grossly intact visible upper thoracic levels. Negative lung apices. IMPRESSION: 1. Positive for trace posttraumatic hemorrhage along the right anterior frontal lobe, favor trace subarachnoid over small hemorrhagic contusion. 2. No intracranial mass effect and no other acute traumatic injury to the brain identified. 3. Right  posterior convexity scalp hematoma without underlying fracture. 4. No acute traumatic injury identified in the cervical spine. Electronically Signed: By: Genevie Ann M.D. On: 01/10/2019 09:09   Ct Cervical Spine Wo Contrast  Addendum Date: 01/10/2019   ADDENDUM REPORT: 01/10/2019 09:31 ADDENDUM: Study discussed by telephone with Dr. Conni Slipper on 01/10/2019 at 0921 hours. Electronically Signed   By: Genevie Ann M.D.   On: 01/10/2019 09:31   Result Date: 01/10/2019 CLINICAL DATA:  85 year old female status post fall this morning. Right orbits/frontal injury. Neck pain. EXAM: CT HEAD WITHOUT CONTRAST CT CERVICAL SPINE WITHOUT CONTRAST TECHNIQUE: Multidetector CT imaging of the head and cervical spine was performed following the standard protocol without intravenous contrast. Multiplanar CT image reconstructions of the cervical spine were also generated. COMPARISON:  CTA head 01/10/2017. CT face 01/09/2017. FINDINGS: CT HEAD FINDINGS Brain: Stable cerebral volume, normal for age. No midline shift, mass effect, or evidence of intracranial mass lesion. No ventriculomegaly. No intraventricular hemorrhage identified. Trace acute hemorrhage along the right anterior frontal convexity on series 2, image 13 appears to be subarachnoid rather than a hemorrhagic cerebral contusion. No other acute intracranial hemorrhage identified. Stable gray-white matter differentiation throughout the brain. No cortically based acute infarct identified. No cortical encephalomalacia. Normal basilar cisterns. Vascular: Calcified atherosclerosis at the skull base. No suspicious intracranial vascular hyperdensity. Skull: Intact. Sinuses/Orbits: Trace to mild paranasal sinus mucosal thickening is new since 2018. Tympanic cavities and mastoids remain clear. Other: Right posterior convexity scalp hematoma measuring up to 4 millimeters in thickness. Underlying calvarium intact. Stable and negative other orbit and scalp soft tissues.  CT CERVICAL SPINE  FINDINGS Alignment: Reversal of upper cervical lordosis is chronic and appears stable since 2018. Bilateral posterior element alignment is within normal limits. Cervicothoracic junction alignment is within normal limits. Skull base and vertebrae: Visualized skull base is intact. No atlanto-occipital dissociation. Congenital incomplete ossification of the posterior C1 ring. No acute osseous abnormality identified. Soft tissues and spinal canal: No prevertebral fluid or swelling. No visible canal hematoma. Disc levels: Widespread advanced cervical spine disc and endplate degeneration. Progressed left facet degeneration at C2-C3 since 2018. Mild if any associated cervical spinal stenosis. Upper chest: Grossly intact visible upper thoracic levels. Negative lung apices. IMPRESSION: 1. Positive for trace posttraumatic hemorrhage along the right anterior frontal lobe, favor trace subarachnoid over small hemorrhagic contusion. 2. No intracranial mass effect and no other acute traumatic injury to the brain identified. 3. Right posterior convexity scalp hematoma without underlying fracture. 4. No acute traumatic injury identified in the cervical spine. Electronically Signed: By: Genevie Ann M.D. On: 01/10/2019 09:09   Ct Shoulder Right Wo Contrast  Result Date: 01/10/2019 CLINICAL DATA:  Right shoulder pain after a fall today. Initial encounter. EXAM: CT OF THE UPPER RIGHT EXTREMITY WITHOUT CONTRAST TECHNIQUE: Multidetector CT imaging of the upper right extremity was performed according to the standard protocol. COMPARISON:  Plain films right shoulder earlier today. FINDINGS: Bones/Joint/Cartilage As seen on the comparison study, the patient has a transverse fracture through the metaphysis of the humerus. The articular surface of the humeral head is rotated medially due to retraction by the rotator cuff. There is mild anterior displacement of the fracture. Nondisplaced component extends into the anterior aspect of the greater  tuberosity. The lesser tuberosity is spared. The humeral head is located and the acromioclavicular joint is intact. Also seen are acute fractures of the posterior arcs of the right fifth, sixth, seventh and eighth ribs. No other fracture is identified. Ligaments Suboptimally assessed by CT. Muscles and Tendons Visualization of the rotator cuff is somewhat limited on CT but no fracture seen. Soft tissues There is some hemorrhage in the subacromial/subdeltoid bursa due to the patient's fracture. Small right pleural effusion is noted. No pneumothorax is identified but the anterior aspect of the right lung is seen only near the apex. Imaged lung parenchyma is clear with the exception of mild dependent atelectasis. IMPRESSION: Transverse fracture through the metaphysis of the humerus with mild impaction includes a nondisplaced component in the anterior aspect of the greater tuberosity. The humeral head is rotated medially due to retraction by the rotator cuff. Acute right fifth through eighth rib fractures with an associated small pleural effusion. No pneumothorax is seen but the anterior margin of the lung is only seen at the apex. Hemorrhage in the subacromial/subdeltoid bursa due to the patient's fracture. Electronically Signed   By: Inge Rise M.D.   On: 01/10/2019 15:34    EKG: Orders placed or performed in visit on 01/10/19  . EKG 12-Lead  . EKG 12-Lead  . EKG 12-Lead    IMPRESSION AND PLAN: Patient is 85 year old white female with leukemia presenting with fall  1.  Subarachnoid hemorrhage which is very minimal.  The ED physician discussed the case with neurosurgeon at [facility] Dr. Araceli Bouche who reviewed the imaging who states that patient does not need transfer recommends Keppra at 250 mg daily for 7 days.  He also stated that patient did not need a repeat imaging unless there are changes in her mental status  2.  Thrombocytopenia related to her leukemia as  well as myelodysplastic syndrome patient  receiving platelet therapy  3.  Right rib and right humerus fracture pain control orthopedic evaluation I will place a sling  4.  AML as above continue treatment as doing at home  5.  Essential hypertension blood pressure is currently normal I will discontinue atenolol hold HCTZ  6.  Miscellaneous SCDs for DVT prophylaxis  All the records are reviewed and case discussed with ED provider. Management plans discussed with the patient, family and they are in agreement.  CODE STATUS: Code Status History    Date Active Date Inactive Code Status Order ID Comments User Context   11/25/2018 1202 12/02/2018 1721 Full Code [id number]  Saundra Shelling, MD Inpatient   Advance Care Planning Activity    Advance Directive Documentation     Most Recent Value  Type of Advance Directive  Healthcare Power of [place]  Pre-existing out of facility DNR order (yellow form or pink MOST form)  -  "MOST" Form in Place?  -       TOTAL TIME TAKING CARE OF THIS PATIENT: 79minutes.    Dustin Flock M.D on 01/10/2019 at 4:10 PM  Between 7am to 6pm - Pager - [phone]  After 6pm go to [email or web address] - password [email or web address]  [employer] [facility]  [phone]  CC: Primary care physician; Rusty Aus, MD

## 2019-01-10 NOTE — Consult Note (Signed)
ORTHOPAEDIC CONSULTATION  PATIENT NAME: Kathryn Hahn DOB: 16-Apr-1934  MRN: [id number]  REQUESTING PHYSICIAN: Dustin Flock, MD  Chief Complaint: Right shoulder pain  HPI: Kathryn Hahn is a 85 y.o. right-hand dominant female who slipped and fell in her bathroom, striking her right shoulder and the right side of her head. She was noted to have a small subarachnoid hemorrhage and was admitted for observation of any mental status or neurologic changes. She also complains of severe right shoulder pain. She has difficulty with active range of motion of the right shoulder due to the pain.  Past Medical History:  Diagnosis Date  . Anemia   . Arthritis   . Breast cancer ([place]) 2003   RT LUMPECTOMY  . Edema leg   . History of breast cancer 2003   post lumpectomy  . History of kidney stones   . Hyperlipemia, mixed   . Hypertension, essential   . Leukemia ([place])   . Leukocytosis   . MDS (myelodysplastic syndrome) ([place])   . Personal history of radiation therapy 2003   BREAST CA  . Renal stones   . Vitamin D deficiency    Past Surgical History:  Procedure Laterality Date  . BREAST BIOPSY Right 2003   Postive for cancer  . BREAST LUMPECTOMY Right 2003   BREAST CA  . KIDNEY STONE SURGERY Right    Social History   Socioeconomic History  . Marital status: Married    Spouse name: Not on file  . Number of children: Not on file  . Years of education: Not on file  . Highest education level: Not on file  Occupational History  . Not on file  Social Needs  . Financial resource strain: Not on file  . Food insecurity    Worry: Not on file    Inability: Not on file  . Transportation needs    Medical: Not on file    Non-medical: Not on file  Tobacco Use  . Smoking status: Never Smoker  . Smokeless tobacco: Never Used  Substance and Sexual Activity  . Alcohol use: Yes    Alcohol/week: 3.0 standard drinks    Types: 3 Glasses of wine per week  . Drug use: No  . Sexual  activity: Not on file  Lifestyle  . Physical activity    Days per week: Not on file    Minutes per session: Not on file  . Stress: Not on file  Relationships  . Social Herbalist on phone: Not on file    Gets together: Not on file    Attends religious service: Not on file    Active member of club or organization: Not on file    Attends meetings of clubs or organizations: Not on file    Relationship status: Not on file  Other Topics Concern  . Not on file  Social History Narrative    No smoking/alcohol; lives in [place] with family.  She lives in assisted living with her husband.   Family History  Problem Relation Age of Onset  . Stroke Mother   . Hypertension Father   . Stroke Father   . Breast cancer Neg Hx    Allergies  Allergen Reactions  . Terbinafine Rash and Swelling   Prior to Admission medications   Medication Sig Start Date End Date Taking? Authorizing Provider  acyclovir (ZOVIRAX) 400 MG tablet One pill a day [to prevent shingles] 12/12/18  Yes Cammie Sickle, MD  atenolol (TENORMIN) 50 MG tablet  Take 1 tablet by mouth 2 (two) times daily.   Yes [provider]  calcium carbonate (OSCAL) 1500 (600 Ca) MG TABS tablet Take 600 mg of elemental calcium by mouth daily with breakfast.   Yes [provider]  Cholecalciferol (VITAMIN D) 2000 units tablet Take 1 tablet by mouth daily.   Yes [provider]  levofloxacin (LEVAQUIN) 250 MG tablet Take 1 tablet (250 mg total) by mouth daily. 12/12/18  Yes Cammie Sickle, MD  losartan-hydrochlorothiazide (HYZAAR) 50-12.5 MG tablet Take 1 tablet by mouth daily.   Yes [provider]  magic mouthwash w/lidocaine SOLN Take 5 mLs by mouth 4 (four) times daily as needed for mouth pain. 11/11/18  Yes Cammie Sickle, MD  posaconazole (NOXAFIL) 100 MG TBEC delayed-release tablet Take 2 tablets (200 mg total) by mouth daily. DO NOT START UNTIL-OK with MD. 12/19/18  Yes  Cammie Sickle, MD  potassium chloride SA (KLOR-CON M20) 20 MEQ tablet TAKE 1 TABLET BY MOUTH TWICE A DAY Patient taking differently: Take 20 mEq by mouth daily.  12/31/18  Yes Cammie Sickle, MD  venetoclax (VENCLEXTA) 100 MG TABS Take 200 mg by mouth daily. Take with food and water. 12/20/18  Yes Cammie Sickle, MD  vitamin C (ASCORBIC ACID) 500 MG tablet Take 1 tablet by mouth 2 (two) times daily.   Yes [provider]  allopurinol (ZYLOPRIM) 300 MG tablet Take 1 tablet (300 mg total) by mouth 2 (two) times daily. Patient not taking: Reported on 01/10/2019 11/19/18   Cammie Sickle, MD  iron polysaccharides (FERREX 150) 150 MG capsule Take 1 capsule (150 mg total) by mouth daily. Patient not taking: Reported on 01/10/2019 10/10/18   Cammie Sickle, MD   Dg Chest 2 View  Result Date: 01/10/2019 CLINICAL DATA:  RIGHT chest pain following fall.  Initial encounter. EXAM: CHEST - 2 VIEW COMPARISON:  None. FINDINGS: Cardiomegaly noted. A LEFT PICC line with tip overlying the LOWER SVC noted. Mild interstitial opacities bilaterally are identified. Trace pleural effusions are noted. No focal airspace disease or pneumothorax. Fractures of the RIGHT 5th through 8th ribs noted. Mildly impacted RIGHT humeral neck fracture extending into the head is identified. RIGHT breast/axillary surgical clips noted. IMPRESSION: 1. RIGHT 5th through 8th rib fractures. No evidence of pneumothorax. 2. RIGHT humeral neck/head fracture. 3. Cardiomegaly with mild interstitial opacities of uncertain chronicity. With trace bilateral pleural effusions, these interstitial opacities may represent mild interstitial edema. Electronically Signed   By: Margarette Canada M.D.   On: 01/10/2019 08:43   Dg Shoulder Right  Result Date: 01/10/2019 CLINICAL DATA:  Acute RIGHT shoulder pain following fall. Initial encounter. EXAM: RIGHT SHOULDER - 2+ VIEW COMPARISON:  None. FINDINGS: A mildly impacted humeral neck  fracture is noted extending into the head and involving the greater tuberosity. No shoulder dislocation. Fractures of the RIGHT 5th through 8th ribs noted.  No pneumothorax. IMPRESSION: 1. Mildly impacted humeral neck fracture extending into the head and greater tuberosity. 2. RIGHT 5th through 8th rib fractures.  No pneumothorax. Electronically Signed   By: Margarette Canada M.D.   On: 01/10/2019 08:46   Ct Head Wo Contrast  Result Date: 01/10/2019 CLINICAL DATA:  Per neurosurgery repeat CT scan of the head in 6 hours. Pt ems from [facility] s/p fall mechanical pt c/o of rt shoulder pain. EXAM: CT HEAD WITHOUT CONTRAST TECHNIQUE: Contiguous axial images were obtained from the base of the skull through the vertex without intravenous  contrast. COMPARISON:  01/10/2019 at 8:30 a.m. FINDINGS: Brain: No change in the small focus of hemorrhage noted along the anterior right frontal lobe. No new intracranial hemorrhage. No hydrocephalus or evidence of an ischemic infarct. Atrophy and mild chronic microvascular ischemic change is stable from the earlier exam. Vascular: No hyperdense vessel or unexpected calcification. Skull: Normal. Negative for fracture or focal lesion. Sinuses/Orbits: No acute finding. Other: None. IMPRESSION: 1. No change from the study obtained earlier today. 2. Trace posttraumatic hemorrhage along the anterior right frontal lobe. No new hemorrhage. Electronically Signed   By: Lajean Manes M.D.   On: 01/10/2019 15:30   Ct Head Wo Contrast  Addendum Date: 01/10/2019   ADDENDUM REPORT: 01/10/2019 09:31 ADDENDUM: Study discussed by telephone with Dr. Conni Slipper on 01/10/2019 at 0921 hours. Electronically Signed   By: Genevie Ann M.D.   On: 01/10/2019 09:31   Result Date: 01/10/2019 CLINICAL DATA:  85 year old female status post fall this morning. Right orbits/frontal injury. Neck pain. EXAM: CT HEAD WITHOUT CONTRAST CT CERVICAL SPINE WITHOUT CONTRAST TECHNIQUE: Multidetector CT imaging of the  head and cervical spine was performed following the standard protocol without intravenous contrast. Multiplanar CT image reconstructions of the cervical spine were also generated. COMPARISON:  CTA head 01/10/2017. CT face 01/09/2017. FINDINGS: CT HEAD FINDINGS Brain: Stable cerebral volume, normal for age. No midline shift, mass effect, or evidence of intracranial mass lesion. No ventriculomegaly. No intraventricular hemorrhage identified. Trace acute hemorrhage along the right anterior frontal convexity on series 2, image 13 appears to be subarachnoid rather than a hemorrhagic cerebral contusion. No other acute intracranial hemorrhage identified. Stable gray-white matter differentiation throughout the brain. No cortically based acute infarct identified. No cortical encephalomalacia. Normal basilar cisterns. Vascular: Calcified atherosclerosis at the skull base. No suspicious intracranial vascular hyperdensity. Skull: Intact. Sinuses/Orbits: Trace to mild paranasal sinus mucosal thickening is new since 2018. Tympanic cavities and mastoids remain clear. Other: Right posterior convexity scalp hematoma measuring up to 4 millimeters in thickness. Underlying calvarium intact. Stable and negative other orbit and scalp soft tissues. CT CERVICAL SPINE FINDINGS Alignment: Reversal of upper cervical lordosis is chronic and appears stable since 2018. Bilateral posterior element alignment is within normal limits. Cervicothoracic junction alignment is within normal limits. Skull base and vertebrae: Visualized skull base is intact. No atlanto-occipital dissociation. Congenital incomplete ossification of the posterior C1 ring. No acute osseous abnormality identified. Soft tissues and spinal canal: No prevertebral fluid or swelling. No visible canal hematoma. Disc levels: Widespread advanced cervical spine disc and endplate degeneration. Progressed left facet degeneration at C2-C3 since 2018. Mild if any associated cervical spinal  stenosis. Upper chest: Grossly intact visible upper thoracic levels. Negative lung apices. IMPRESSION: 1. Positive for trace posttraumatic hemorrhage along the right anterior frontal lobe, favor trace subarachnoid over small hemorrhagic contusion. 2. No intracranial mass effect and no other acute traumatic injury to the brain identified. 3. Right posterior convexity scalp hematoma without underlying fracture. 4. No acute traumatic injury identified in the cervical spine. Electronically Signed: By: Genevie Ann M.D. On: 01/10/2019 09:09   Ct Cervical Spine Wo Contrast  Addendum Date: 01/10/2019   ADDENDUM REPORT: 01/10/2019 09:31 ADDENDUM: Study discussed by telephone with Dr. Conni Slipper on 01/10/2019 at 0921 hours. Electronically Signed   By: Genevie Ann M.D.   On: 01/10/2019 09:31   Result Date: 01/10/2019 CLINICAL DATA:  85 year old female status post fall this morning. Right orbits/frontal injury. Neck pain. EXAM: CT HEAD WITHOUT CONTRAST CT CERVICAL SPINE WITHOUT  CONTRAST TECHNIQUE: Multidetector CT imaging of the head and cervical spine was performed following the standard protocol without intravenous contrast. Multiplanar CT image reconstructions of the cervical spine were also generated. COMPARISON:  CTA head 01/10/2017. CT face 01/09/2017. FINDINGS: CT HEAD FINDINGS Brain: Stable cerebral volume, normal for age. No midline shift, mass effect, or evidence of intracranial mass lesion. No ventriculomegaly. No intraventricular hemorrhage identified. Trace acute hemorrhage along the right anterior frontal convexity on series 2, image 13 appears to be subarachnoid rather than a hemorrhagic cerebral contusion. No other acute intracranial hemorrhage identified. Stable gray-white matter differentiation throughout the brain. No cortically based acute infarct identified. No cortical encephalomalacia. Normal basilar cisterns. Vascular: Calcified atherosclerosis at the skull base. No suspicious intracranial vascular  hyperdensity. Skull: Intact. Sinuses/Orbits: Trace to mild paranasal sinus mucosal thickening is new since 2018. Tympanic cavities and mastoids remain clear. Other: Right posterior convexity scalp hematoma measuring up to 4 millimeters in thickness. Underlying calvarium intact. Stable and negative other orbit and scalp soft tissues. CT CERVICAL SPINE FINDINGS Alignment: Reversal of upper cervical lordosis is chronic and appears stable since 2018. Bilateral posterior element alignment is within normal limits. Cervicothoracic junction alignment is within normal limits. Skull base and vertebrae: Visualized skull base is intact. No atlanto-occipital dissociation. Congenital incomplete ossification of the posterior C1 ring. No acute osseous abnormality identified. Soft tissues and spinal canal: No prevertebral fluid or swelling. No visible canal hematoma. Disc levels: Widespread advanced cervical spine disc and endplate degeneration. Progressed left facet degeneration at C2-C3 since 2018. Mild if any associated cervical spinal stenosis. Upper chest: Grossly intact visible upper thoracic levels. Negative lung apices. IMPRESSION: 1. Positive for trace posttraumatic hemorrhage along the right anterior frontal lobe, favor trace subarachnoid over small hemorrhagic contusion. 2. No intracranial mass effect and no other acute traumatic injury to the brain identified. 3. Right posterior convexity scalp hematoma without underlying fracture. 4. No acute traumatic injury identified in the cervical spine. Electronically Signed: By: Genevie Ann M.D. On: 01/10/2019 09:09   Ct Shoulder Right Wo Contrast  Result Date: 01/10/2019 CLINICAL DATA:  Right shoulder pain after a fall today. Initial encounter. EXAM: CT OF THE UPPER RIGHT EXTREMITY WITHOUT CONTRAST TECHNIQUE: Multidetector CT imaging of the upper right extremity was performed according to the standard protocol. COMPARISON:  Plain films right shoulder earlier today. FINDINGS:  Bones/Joint/Cartilage As seen on the comparison study, the patient has a transverse fracture through the metaphysis of the humerus. The articular surface of the humeral head is rotated medially due to retraction by the rotator cuff. There is mild anterior displacement of the fracture. Nondisplaced component extends into the anterior aspect of the greater tuberosity. The lesser tuberosity is spared. The humeral head is located and the acromioclavicular joint is intact. Also seen are acute fractures of the posterior arcs of the right fifth, sixth, seventh and eighth ribs. No other fracture is identified. Ligaments Suboptimally assessed by CT. Muscles and Tendons Visualization of the rotator cuff is somewhat limited on CT but no fracture seen. Soft tissues There is some hemorrhage in the subacromial/subdeltoid bursa due to the patient's fracture. Small right pleural effusion is noted. No pneumothorax is identified but the anterior aspect of the right lung is seen only near the apex. Imaged lung parenchyma is clear with the exception of mild dependent atelectasis. IMPRESSION: Transverse fracture through the metaphysis of the humerus with mild impaction includes a nondisplaced component in the anterior aspect of the greater tuberosity. The humeral head is rotated medially  due to retraction by the rotator cuff. Acute right fifth through eighth rib fractures with an associated small pleural effusion. No pneumothorax is seen but the anterior margin of the lung is only seen at the apex. Hemorrhage in the subacromial/subdeltoid bursa due to the patient's fracture. Electronically Signed   By: Inge Rise M.D.   On: 01/10/2019 15:34    Positive ROS: All other systems have been reviewed and were otherwise negative with the exception of those mentioned in the HPI and as above.  Physical Exam: General: Well developed, well nourished female seen in no acute distress. HEENT: Atraumatic and normocephalic. Sclera are  clear. Extraocular motion is intact. Oropharynx is clear with moist mucosa.  Right periorbital swelling Neck: Supple, nontender, good range of motion. No JVD or carotid bruits. Lungs: Clear to auscultation bilaterally. Cardiovascular: Regular rate and rhythm with normal S1 and S2. No murmurs. No gallops or rubs.  Abdomen: Soft, nontender, and nondistended. Bowel sounds are present. Skin: No lesions in the area of chief complaint Neurologic: Awake, alert, and oriented. Sensory function is grossly intact. Motor strength is felt to be 5 over 5 bilaterally with the exception of the right upper extremity that was not assessed secondary to the injury. No clonus or tremor. Good motor coordination. Lymphatic: No axillary or cervical lymphadenopathy  MUSCULOSKELETAL: Examination of the right upper extremity demonstrates a shoulder immobilizer to be in place.  There is moderate swelling to the area of the shoulder without ecchymosis or erythema.  Tenderness to palpation about the shoulder is noted.  Normal shoulder contour.  No tenderness to palpation about the elbow or wrist.  Assessment: Right proximal humerus fracture  Plan: The findings were reviewed with the patient. I also attempted to contact the patient's granddaughter Fanny Skates [phone]). Continue with immobilization of the right shoulder. Elevate HOB for comfort. Cold therapy to the right shoulder. I will discuss the findings with Dr. Roland Rack and/or Dr. Posey Pronto regarding treatment options.  James P. Holley Bouche M.D.

## 2019-01-10 NOTE — Progress Notes (Signed)
Advanced care plan.  Purpose of the Encounter: CODE STATUS  Parties in Attendance: Patient herself and husband at bedside  Patient's Decision Capacity: Intact  Subjective/Patient's story: Kathryn Hahn  is a 85 y.o. 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  Patient's fell and hit her head on the right side.  She also hit her chest as well as right shoulder.   Objective/Medical story I discussed with the patient regarding her desires for cardiac and pulmonary resuscitation   Goals of care determination:  Patient states that she wants everything to be done and wants to be a full code   CODE STATUS: Full code   Time spent discussing advanced care planning: 16 minutes

## 2019-01-10 NOTE — ED Provider Notes (Signed)
[facility] Emergency Department Provider Note   ____________________________________________   First MD Initiated Contact with Patient 01/10/19 [id number]     (approximate)  I have reviewed the triage vital signs and the nursing notes.   HISTORY  Chief Complaint Shoulder Injury   HPI Caretha Klimowicz is a 85 y.o. female who comes from independent living.  She reports she slipped and fell and has a lot of shoulder pain.  Additionally she has a lump on the right side of the head.  EMS noted her O2 sats were down.  She goes to the cancer center.  She had an appointment today for evaluation of her blood count and platelets.   She has a history of acute myeloid leukemia with failed remission and sees Dr. Rogue Bussing.      Past Medical History:  Diagnosis Date   Anemia    Arthritis    [place]   RT LUMPECTOMY   Edema leg    History of breast cancer 2003   post lumpectomy   History of kidney stones    Hyperlipemia, mixed    Hypertension, essential    Leukemia ([place])    Leukocytosis    MDS (myelodysplastic syndrome) ([place])    Personal history of radiation therapy 2003   BREAST CA   Renal stones    Vitamin D deficiency     Patient Active Problem List   Diagnosis Date Noted   Drug-induced neutropenia ([place]) 12/27/2018   AML (acute myeloid leukemia) with failed remission ([place]) 11/25/2018   Dehydration 11/25/2018   Tumor lysis syndrome 11/25/2018   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 Authorizing Provider  acyclovir (ZOVIRAX) 400 MG tablet One pill a day [to prevent shingles] 12/12/18  Yes Cammie Sickle, MD  atenolol (TENORMIN) 50 MG tablet Take 1 tablet by mouth 2 (two) times daily.   Yes [provider]  calcium carbonate (OSCAL) 1500 (600 Ca) MG TABS tablet Take 600 mg of elemental calcium by mouth daily with breakfast.   Yes [provider]  Cholecalciferol (VITAMIN D) 2000 units tablet Take 1 tablet by mouth daily.   Yes [provider]  levofloxacin (LEVAQUIN) 250 MG tablet Take 1 tablet (250 mg total) by mouth daily. 12/12/18  Yes Cammie Sickle, MD  losartan-hydrochlorothiazide (HYZAAR) 50-12.5 MG tablet Take 1 tablet by mouth daily.   Yes [provider]  magic mouthwash w/lidocaine SOLN Take 5 mLs by mouth 4 (four) times daily as needed for mouth pain. 11/11/18  Yes Cammie Sickle, MD  posaconazole (NOXAFIL) 100 MG TBEC delayed-release tablet Take 2 tablets (200 mg total) by mouth daily. DO NOT START UNTIL-OK with MD. 12/19/18  Yes Cammie Sickle, MD  potassium chloride SA (KLOR-CON M20) 20 MEQ tablet TAKE 1 TABLET BY MOUTH TWICE A DAY Patient taking differently: Take 20 mEq by mouth daily.  12/31/18  Yes Cammie Sickle, MD  venetoclax [facility]) 100  MG TABS Take 200 mg by mouth daily. Take with food and water. 12/20/18  Yes Cammie Sickle, MD  vitamin C (ASCORBIC ACID) 500 MG tablet Take 1 tablet by mouth 2 (two) times daily.   Yes [provider]  allopurinol (ZYLOPRIM) 300 MG tablet Take 1 tablet (300 mg total) by mouth 2 (two) times daily. Patient not taking: Reported on 01/10/2019 11/19/18   Cammie Sickle, MD  iron polysaccharides (FERREX 150) 150 MG capsule Take 1 capsule (150 mg total) by mouth daily. Patient not taking: Reported on 01/10/2019 10/10/18    Cammie Sickle, MD    Allergies Terbinafine  Family History  Problem Relation Age of Onset   Stroke Mother    Hypertension Father    Stroke Father    Breast cancer Neg Hx     Social History Social History   Tobacco Use   Smoking status: Never Smoker   Smokeless tobacco: Never Used  Substance Use Topics   Alcohol use: Yes    Alcohol/week: 3.0 standard drinks    Types: 3 Glasses of wine per week   Drug use: No    Review of Systems  Constitutional: No fever/chills Eyes: No visual changes. ENT: No sore throat. Cardiovascular: Denies chest pain. Respiratory: Denies shortness of breath. Gastrointestinal: No abdominal pain.  No nausea, no vomiting.  No diarrhea.  No constipation. Genitourinary: Negative for dysuria. Musculoskeletal: Negative for back pain. Skin: Negative for rash. Neurological: Negative for headaches, focal weakness   ____________________________________________   PHYSICAL EXAM:  VITAL SIGNS: ED Triage Vitals  Enc Vitals Group     BP      Pulse      Resp      Temp      Temp src      SpO2      Weight      Height      Head Circumference      Peak Flow      Pain Score      Pain Loc      Pain Edu?      Excl. in [place]?     Constitutional: Alert and oriented.  Complaining of shoulder pain and holding her right arm. Eyes: Conjunctivae are normal.  Head: Lump on the right side of the head Nose: No congestion/rhinnorhea. Mouth/Throat: Mucous membranes are moist.  Oropharynx non-erythematous. Neck: No stridor.  Cardiovascular: Normal rate, regular rhythm. Grossly normal heart sounds.  Good peripheral circulation. Respiratory: Normal respiratory effort.  No retractions. Lungs CTAB. Gastrointestinal: Soft and nontender. No distention. No abdominal bruits. No CVA tenderness. Musculoskeletal: No lower extremity tenderness nor edema.  Neurologic:  Normal speech and language. No gross focal neurologic deficits are appreciated. No gait  instability. Skin:  Skin is warm, dry and intact. No rash noted.   ____________________________________________   LABS (all labs ordered are listed, but only abnormal results are displayed)  Labs Reviewed  COMPREHENSIVE METABOLIC PANEL - Abnormal; Notable for the following components:      Result Value   Glucose, Bld 137 (*)    Calcium 8.7 (*)    Albumin 3.3 (*)    All other components within normal limits  CBC WITH DIFFERENTIAL/PLATELET - Abnormal; Notable for the following components:   WBC 2.3 (*)    RBC 3.05 (*)    Hemoglobin 7.4 (*)    HCT 23.4 (*)    MCV 76.7 (*)    MCH 24.3 (*)    RDW 21.5 (*)  Platelets 30 (*)    nRBC 1.3 (*)    Neutro Abs 1.2 (*)    Lymphs Abs 0.6 (*)    Abs Immature Granulocytes 0.09 (*)    All other components within normal limits  APTT - Abnormal; Notable for the following components:   aPTT 38 (*)    All other components within normal limits  SARS CORONAVIRUS 2 (HOSPITAL ORDER, [place] [facility])  PROTIME-INR  PREPARE PLATELET PHERESIS   ____________________________________________  EKG  EKG read interpreted by me shows normal sinus rhythm rate of 75 normal axis no acute ST-T changes ____________________________________________  RADIOLOGY  ED MD interpretation:   Official radiology report(s): Dg Chest 2 View  Result Date: 01/10/2019 CLINICAL DATA:  RIGHT chest pain following fall.  Initial encounter. EXAM: CHEST - 2 VIEW COMPARISON:  None. FINDINGS: Cardiomegaly noted. A LEFT PICC line with tip overlying the LOWER SVC noted. Mild interstitial opacities bilaterally are identified. Trace pleural effusions are noted. No focal airspace disease or pneumothorax. Fractures of the RIGHT 5th through 8th ribs noted. Mildly impacted RIGHT humeral neck fracture extending into the head is identified. RIGHT breast/axillary surgical clips noted. IMPRESSION: 1. RIGHT 5th through 8th rib fractures. No evidence of pneumothorax. 2.  RIGHT humeral neck/head fracture. 3. Cardiomegaly with mild interstitial opacities of uncertain chronicity. With trace bilateral pleural effusions, these interstitial opacities may represent mild interstitial edema. Electronically Signed   By: Margarette Canada M.D.   On: 01/10/2019 08:43   Dg Shoulder Right  Result Date: 01/10/2019 CLINICAL DATA:  Acute RIGHT shoulder pain following fall. Initial encounter. EXAM: RIGHT SHOULDER - 2+ VIEW COMPARISON:  None. FINDINGS: A mildly impacted humeral neck fracture is noted extending into the head and involving the greater tuberosity. No shoulder dislocation. Fractures of the RIGHT 5th through 8th ribs noted.  No pneumothorax. IMPRESSION: 1. Mildly impacted humeral neck fracture extending into the head and greater tuberosity. 2. RIGHT 5th through 8th rib fractures.  No pneumothorax. Electronically Signed   By: Margarette Canada M.D.   On: 01/10/2019 08:46   Ct Head Wo Contrast  Result Date: 01/10/2019 CLINICAL DATA:  Per neurosurgery repeat CT scan of the head in 6 hours. Pt ems from [facility] s/p fall mechanical pt c/o of rt shoulder pain. EXAM: CT HEAD WITHOUT CONTRAST TECHNIQUE: Contiguous axial images were obtained from the base of the skull through the vertex without intravenous contrast. COMPARISON:  01/10/2019 at 8:30 a.m. FINDINGS: Brain: No change in the small focus of hemorrhage noted along the anterior right frontal lobe. No new intracranial hemorrhage. No hydrocephalus or evidence of an ischemic infarct. Atrophy and mild chronic microvascular ischemic change is stable from the earlier exam. Vascular: No hyperdense vessel or unexpected calcification. Skull: Normal. Negative for fracture or focal lesion. Sinuses/Orbits: No acute finding. Other: None. IMPRESSION: 1. No change from the study obtained earlier today. 2. Trace posttraumatic hemorrhage along the anterior right frontal lobe. No new hemorrhage. Electronically Signed   By: Lajean Manes M.D.   On:  01/10/2019 15:30   Ct Head Wo Contrast  Addendum Date: 01/10/2019   ADDENDUM REPORT: 01/10/2019 09:31 ADDENDUM: Study discussed by telephone with Dr. Conni Slipper on 01/10/2019 at 0921 hours. Electronically Signed   By: Genevie Ann M.D.   On: 01/10/2019 09:31   Result Date: 01/10/2019 CLINICAL DATA:  85 year old female status post fall this morning. Right orbits/frontal injury. Neck pain. EXAM: CT HEAD WITHOUT CONTRAST CT CERVICAL SPINE WITHOUT CONTRAST TECHNIQUE: Multidetector CT  imaging of the head and cervical spine was performed following the standard protocol without intravenous contrast. Multiplanar CT image reconstructions of the cervical spine were also generated. COMPARISON:  CTA head 01/10/2017. CT face 01/09/2017. FINDINGS: CT HEAD FINDINGS Brain: Stable cerebral volume, normal for age. No midline shift, mass effect, or evidence of intracranial mass lesion. No ventriculomegaly. No intraventricular hemorrhage identified. Trace acute hemorrhage along the right anterior frontal convexity on series 2, image 13 appears to be subarachnoid rather than a hemorrhagic cerebral contusion. No other acute intracranial hemorrhage identified. Stable gray-white matter differentiation throughout the brain. No cortically based acute infarct identified. No cortical encephalomalacia. Normal basilar cisterns. Vascular: Calcified atherosclerosis at the skull base. No suspicious intracranial vascular hyperdensity. Skull: Intact. Sinuses/Orbits: Trace to mild paranasal sinus mucosal thickening is new since 2018. Tympanic cavities and mastoids remain clear. Other: Right posterior convexity scalp hematoma measuring up to 4 millimeters in thickness. Underlying calvarium intact. Stable and negative other orbit and scalp soft tissues. CT CERVICAL SPINE FINDINGS Alignment: Reversal of upper cervical lordosis is chronic and appears stable since 2018. Bilateral posterior element alignment is within normal limits. Cervicothoracic  junction alignment is within normal limits. Skull base and vertebrae: Visualized skull base is intact. No atlanto-occipital dissociation. Congenital incomplete ossification of the posterior C1 ring. No acute osseous abnormality identified. Soft tissues and spinal canal: No prevertebral fluid or swelling. No visible canal hematoma. Disc levels: Widespread advanced cervical spine disc and endplate degeneration. Progressed left facet degeneration at C2-C3 since 2018. Mild if any associated cervical spinal stenosis. Upper chest: Grossly intact visible upper thoracic levels. Negative lung apices. IMPRESSION: 1. Positive for trace posttraumatic hemorrhage along the right anterior frontal lobe, favor trace subarachnoid over small hemorrhagic contusion. 2. No intracranial mass effect and no other acute traumatic injury to the brain identified. 3. Right posterior convexity scalp hematoma without underlying fracture. 4. No acute traumatic injury identified in the cervical spine. Electronically Signed: By: Genevie Ann M.D. On: 01/10/2019 09:09   Ct Cervical Spine Wo Contrast  Addendum Date: 01/10/2019   ADDENDUM REPORT: 01/10/2019 09:31 ADDENDUM: Study discussed by telephone with Dr. Conni Slipper on 01/10/2019 at 0921 hours. Electronically Signed   By: Genevie Ann M.D.   On: 01/10/2019 09:31   Result Date: 01/10/2019 CLINICAL DATA:  85 year old female status post fall this morning. Right orbits/frontal injury. Neck pain. EXAM: CT HEAD WITHOUT CONTRAST CT CERVICAL SPINE WITHOUT CONTRAST TECHNIQUE: Multidetector CT imaging of the head and cervical spine was performed following the standard protocol without intravenous contrast. Multiplanar CT image reconstructions of the cervical spine were also generated. COMPARISON:  CTA head 01/10/2017. CT face 01/09/2017. FINDINGS: CT HEAD FINDINGS Brain: Stable cerebral volume, normal for age. No midline shift, mass effect, or evidence of intracranial mass lesion. No ventriculomegaly. No  intraventricular hemorrhage identified. Trace acute hemorrhage along the right anterior frontal convexity on series 2, image 13 appears to be subarachnoid rather than a hemorrhagic cerebral contusion. No other acute intracranial hemorrhage identified. Stable gray-white matter differentiation throughout the brain. No cortically based acute infarct identified. No cortical encephalomalacia. Normal basilar cisterns. Vascular: Calcified atherosclerosis at the skull base. No suspicious intracranial vascular hyperdensity. Skull: Intact. Sinuses/Orbits: Trace to mild paranasal sinus mucosal thickening is new since 2018. Tympanic cavities and mastoids remain clear. Other: Right posterior convexity scalp hematoma measuring up to 4 millimeters in thickness. Underlying calvarium intact. Stable and negative other orbit and scalp soft tissues. CT CERVICAL SPINE FINDINGS Alignment: Reversal of upper cervical lordosis is  chronic and appears stable since 2018. Bilateral posterior element alignment is within normal limits. Cervicothoracic junction alignment is within normal limits. Skull base and vertebrae: Visualized skull base is intact. No atlanto-occipital dissociation. Congenital incomplete ossification of the posterior C1 ring. No acute osseous abnormality identified. Soft tissues and spinal canal: No prevertebral fluid or swelling. No visible canal hematoma. Disc levels: Widespread advanced cervical spine disc and endplate degeneration. Progressed left facet degeneration at C2-C3 since 2018. Mild if any associated cervical spinal stenosis. Upper chest: Grossly intact visible upper thoracic levels. Negative lung apices. IMPRESSION: 1. Positive for trace posttraumatic hemorrhage along the right anterior frontal lobe, favor trace subarachnoid over small hemorrhagic contusion. 2. No intracranial mass effect and no other acute traumatic injury to the brain identified. 3. Right posterior convexity scalp hematoma without underlying  fracture. 4. No acute traumatic injury identified in the cervical spine. Electronically Signed: By: Genevie Ann M.D. On: 01/10/2019 09:09    ____________________________________________   PROCEDURES  Procedure(s) performed (including Critical Care): Critical care time 1 hour.  This involves and includes reviewing the patient's CT scans examining the patient and reexamined the patient talking to the patient and her husband about what is going and discussing the patient with the cancer doctor and the neurosurgeon and the hospitalist twice.  Procedures   ____________________________________________   INITIAL IMPRESSION / ASSESSMENT AND PLAN / ED COURSE  In the emergency room patient's O2 sats are normal however after she gets 2 of morphine she becomes very groggy but arousable and her O2 sats dropped into the 80s.  She is good on 2 L now. ----------------------------------------- 12:01 PM on 01/10/2019 -----------------------------------------  Dr. Nikki Dom from [facility] neurosurgery calls back now.  He wants me to start her on Keppra 250 once a day for 7 days for seizure prophylaxis and recommends a CT scan after total of 6 hours.  If there is no change she will not need any further evaluation of her head injury.  I will call hospitalist and see if we can get her in the hospital here she will need to be in for a few days because of the rib fractures.         Clinical Course as of Jan 09 1534  Fri Jan 10, 2019  0939 Calcium(!): 8.7 [PM]    Clinical Course User Index [PM] Nena Polio, MD     ____________________________________________   FINAL CLINICAL IMPRESSION(S) / ED DIAGNOSES  Final diagnoses:  Fall, initial encounter  Subarachnoid hemorrhage following injury, no loss of consciousness, initial encounter [facility])  Other closed displaced fracture of proximal end of right humerus, initial encounter  Closed fracture of multiple ribs of right side, initial encounter     ED  Discharge Orders    None       Note:  This document was prepared using Dragon voice recognition software and may include unintentional dictation errors.    Nena Polio, MD 01/10/19 1536

## 2019-01-10 NOTE — Telephone Encounter (Signed)
Spoke with emergency room physician-  currently admitted to the hospital for fall-head trauma/intracranial bleed; also humeral/rib fractures.  Platelets 30; recommend platelet transfusion.  #Spoke with patient's husband updated-patient's condition.  Follow-up/chemotherapy next week will be held; this will need to be rescheduled based upon patient's discharge from the hospital.

## 2019-01-10 NOTE — ED Notes (Signed)
Pt returned from CT, husband at bedside

## 2019-01-10 NOTE — ED Notes (Signed)
Pt with RA O2 sat 84%. Pt states that she feels sleepy s/p pain med. Pt placed on 2 LNC.  O2 sats 100%

## 2019-01-10 NOTE — ED Triage Notes (Signed)
Pt ems from [facility] s/p fall mechanical pt c/o of rt shoulder pain.

## 2019-01-11 DIAGNOSIS — S066X0A Traumatic subarachnoid hemorrhage without loss of consciousness, initial encounter: Principal | ICD-10-CM

## 2019-01-11 DIAGNOSIS — S42291A Other displaced fracture of upper end of right humerus, initial encounter for closed fracture: Secondary | ICD-10-CM

## 2019-01-11 DIAGNOSIS — C9200 Acute myeloblastic leukemia, not having achieved remission: Secondary | ICD-10-CM

## 2019-01-11 DIAGNOSIS — I609 Nontraumatic subarachnoid hemorrhage, unspecified: Secondary | ICD-10-CM

## 2019-01-11 DIAGNOSIS — S2241XA Multiple fractures of ribs, right side, initial encounter for closed fracture: Secondary | ICD-10-CM

## 2019-01-11 DIAGNOSIS — D649 Anemia, unspecified: Secondary | ICD-10-CM

## 2019-01-11 DIAGNOSIS — D696 Thrombocytopenia, unspecified: Secondary | ICD-10-CM

## 2019-01-11 DIAGNOSIS — D701 Agranulocytosis secondary to cancer chemotherapy: Secondary | ICD-10-CM

## 2019-01-11 DIAGNOSIS — T451X5A Adverse effect of antineoplastic and immunosuppressive drugs, initial encounter: Secondary | ICD-10-CM

## 2019-01-11 LAB — CBC WITH DIFFERENTIAL/PLATELET
Abs Immature Granulocytes: 0.1 10*3/uL — ABNORMAL HIGH (ref 0.00–0.07)
Basophils Absolute: 0 10*3/uL (ref 0.0–0.1)
Basophils Relative: 1 %
Eosinophils Absolute: 0 10*3/uL (ref 0.0–0.5)
Eosinophils Relative: 0 %
HCT: 21.6 % — ABNORMAL LOW (ref 36.0–46.0)
Hemoglobin: 6.8 g/dL — ABNORMAL LOW (ref 12.0–15.0)
Immature Granulocytes: 7 %
Lymphocytes Relative: 36 %
Lymphs Abs: 0.6 10*3/uL — ABNORMAL LOW (ref 0.7–4.0)
MCH: 24.2 pg — ABNORMAL LOW (ref 26.0–34.0)
MCHC: 31.5 g/dL (ref 30.0–36.0)
MCV: 76.9 fL — ABNORMAL LOW (ref 80.0–100.0)
Monocytes Absolute: 0.4 10*3/uL (ref 0.1–1.0)
Monocytes Relative: 26 %
Neutro Abs: 0.4 10*3/uL — ABNORMAL LOW (ref 1.7–7.7)
Neutrophils Relative %: 30 %
Platelets: 34 10*3/uL — ABNORMAL LOW (ref 150–400)
RBC: 2.81 MIL/uL — ABNORMAL LOW (ref 3.87–5.11)
RDW: 21.4 % — ABNORMAL HIGH (ref 11.5–15.5)
Smear Review: DECREASED
WBC: 1.5 10*3/uL — ABNORMAL LOW (ref 4.0–10.5)
nRBC: 2.7 % — ABNORMAL HIGH (ref 0.0–0.2)

## 2019-01-11 LAB — BPAM PLATELET PHERESIS
Blood Product Expiration Date: 202008011507
ISSUE DATE / TIME: 202007311603
Unit Type and Rh: 6200

## 2019-01-11 LAB — PREPARE PLATELET PHERESIS: Unit division: 0

## 2019-01-11 MED ORDER — CIPROFLOXACIN HCL 500 MG PO TABS
500.0000 mg | ORAL_TABLET | Freq: Two times a day (BID) | ORAL | Status: DC
  Administered 2019-01-11 – 2019-01-14 (×6): 500 mg via ORAL
  Filled 2019-01-11 (×7): qty 1

## 2019-01-11 MED ORDER — FUROSEMIDE 10 MG/ML IJ SOLN
20.0000 mg | Freq: Once | INTRAMUSCULAR | Status: AC
  Administered 2019-01-12: 20 mg via INTRAVENOUS
  Filled 2019-01-11: qty 4

## 2019-01-11 MED ORDER — HEPARIN SOD (PORK) LOCK FLUSH 100 UNIT/ML IV SOLN
500.0000 [IU] | Freq: Every day | INTRAVENOUS | Status: DC | PRN
  Filled 2019-01-11: qty 5

## 2019-01-11 MED ORDER — HEPARIN SOD (PORK) LOCK FLUSH 100 UNIT/ML IV SOLN
250.0000 [IU] | INTRAVENOUS | Status: DC | PRN
  Filled 2019-01-11: qty 3

## 2019-01-11 MED ORDER — ACETAMINOPHEN 325 MG PO TABS
650.0000 mg | ORAL_TABLET | Freq: Once | ORAL | Status: AC
  Administered 2019-01-12: 650 mg via ORAL
  Filled 2019-01-11: qty 2

## 2019-01-11 MED ORDER — DIPHENHYDRAMINE HCL 25 MG PO CAPS
25.0000 mg | ORAL_CAPSULE | Freq: Once | ORAL | Status: AC
  Administered 2019-01-12: 25 mg via ORAL
  Filled 2019-01-11: qty 1

## 2019-01-11 MED ORDER — SODIUM CHLORIDE 0.9% FLUSH: 10.0000 mL | INTRAVENOUS | Status: DC | PRN

## 2019-01-11 MED ORDER — SODIUM CHLORIDE 0.9% IV SOLUTION
250.0000 mL | Freq: Once | INTRAVENOUS | Status: AC
  Administered 2019-01-12: 250 mL via INTRAVENOUS

## 2019-01-11 MED ORDER — SODIUM CHLORIDE 0.9% FLUSH: 3.0000 mL | INTRAVENOUS | Status: DC | PRN

## 2019-01-11 NOTE — Consult Note (Signed)
Hematology/Oncology Consult note [facility] [phone] [phone]  Patient Care Team: Rusty Aus, MD as PCP - General (Internal Medicine)   Name of the patient: Kathryn Hahn  [id number]  [birth year]   Date of visit: 01/11/19 REASON FOR COSULTATION:  Anemia, thrombocytopenia, AML History of presenting illness-  85 y.o. female with PMH listed at below who presents to ER for evaluation after a fall.  She had a mechanical fall after slipping on wet floor and fell in her bathroom.  She hit her right shoulder and the right side of her head. 01/10/2019 840 CT head showed small sub-arachnoid hemorrhage and was admitted for observation for mental status or neurologic changes and pain control for right shoulder.  Patient has a history of AML, monocytic differentiation.  She has been on Vidaza and venetoclax.  Currently off venetoclax due to cytopenia.  On admission, her platelet counts was 30,000.  Received 1 unit of platelet transfusion yesterday.  Repeat CT 01/10/2019 showed no change from study obtained earlier.  Trace posttraumatic hemorrhage along anterior right frontal lobe.  No new hemorrhage. Patient is lying in bed.  Breathing via nasal cannula oxygen.  Feels fatigued and tired.  No headache, nausea vomiting or focal weakness.  Right shoulder pain. Denies any bleeding events. Review of Systems  Constitutional: Positive for appetite change and fatigue. Negative for chills and fever.  HENT:   Negative for hearing loss and voice change.   Eyes: Negative for eye problems.  Respiratory: Negative for chest tightness and cough.   Cardiovascular: Negative for chest pain.  Gastrointestinal: Negative for abdominal distention, abdominal pain and blood in stool.  Endocrine: Negative for hot flashes.  Genitourinary: Negative for difficulty urinating and frequency.   Musculoskeletal: Negative for arthralgias.       Right shoulder pain  Skin:  Negative for itching and rash.  Neurological: Negative for extremity weakness, headaches and light-headedness.  Hematological: Negative for adenopathy.  Psychiatric/Behavioral: Negative for confusion.    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   Goals of care, counseling/discussion 08/28/2018   Splenomegaly 02/22/2016   Essential hypertension 02/22/2015   History of breast cancer 02/22/2015   History of kidney stones 02/22/2015   Hyperlipidemia, mixed 02/22/2015   Vitamin D deficiency 02/22/2015     Past Medical History:  Diagnosis Date   Anemia    Arthritis    Breast cancer ([place]) 2003   RT LUMPECTOMY   Edema leg    History of breast cancer 2003   post lumpectomy   History of kidney stones    Hyperlipemia, mixed    Hypertension, essential    Leukemia ([place])    Leukocytosis    MDS (myelodysplastic syndrome) ([place])    Personal history of radiation therapy 2003   BREAST CA   Renal stones    Vitamin D deficiency      Past Surgical History:  Procedure Laterality Date   BREAST BIOPSY Right 2003   Postive for cancer   BREAST LUMPECTOMY Right 2003   BREAST CA   KIDNEY STONE SURGERY Right     Social History   Socioeconomic History   Marital status: Married  Spouse name: Not on file   Number of children: Not on file   Years of education: Not on file   Highest education level: Not on file  Occupational History   Not on file  Social Needs   Financial resource strain: Not on file   Food insecurity    Worry: Not on file    Inability: Not on file    Transportation needs    Medical: Not on file    Non-medical: Not on file  Tobacco Use   Smoking status: Never Smoker   Smokeless tobacco: Never Used  Substance and Sexual Activity   Alcohol use: Yes    Alcohol/week: 3.0 standard drinks    Types: 3 Glasses of wine per week   Drug use: No   Sexual activity: Not on file  Lifestyle   Physical activity    Days per week: Not on file    Minutes per session: Not on file   Stress: Not on file  Relationships   Social connections    Talks on phone: Not on file    Gets together: Not on file    Attends religious service: Not on file    Active member of club or organization: Not on file    Attends meetings of clubs or organizations: Not on file    Relationship status: Not on file   Intimate partner violence    Fear of current or ex partner: Not on file    Emotionally abused: Not on file    Physically abused: Not on file    Forced sexual activity: Not on file  Other Topics Concern   Not on file  Social History Narrative    No smoking/alcohol; lives in [place] with family.  She lives in assisted living with her husband.     Family History  Problem Relation Age of Onset   Stroke Mother    Hypertension Father    Stroke Father    Breast cancer Neg Hx      Current Facility-Administered Medications:    0.9 %  sodium chloride infusion, , Intravenous, Continuous, Dustin Flock, MD, Last Rate: 50 mL/hr at 01/10/19 2140   acetaminophen (TYLENOL) tablet 650 mg, 650 mg, Oral, Q6H PRN, 650 mg at 01/11/19 1243 **OR** acetaminophen (TYLENOL) suppository 650 mg, 650 mg, Rectal, Q6H PRN, Dustin Flock, MD   acyclovir (ZOVIRAX) 200 MG capsule 400 mg, 400 mg, Oral, Daily, Dustin Flock, MD, 400 mg at 01/11/19 0926   atenolol (TENORMIN) tablet 50 mg, 50 mg, Oral, BID, Dustin Flock, MD, 50 mg at 01/11/19 0943   calcium carbonate (TUMS - dosed in mg elemental calcium) chewable tablet 600 mg of elemental calcium, 600 mg of  elemental calcium, Oral, Q breakfast, Dustin Flock, MD   cholecalciferol (VITAMIN D) tablet 1,000 Units, 1,000 Units, Oral, Daily, Dustin Flock, MD, 1,000 Units at 01/11/19 9518   HYDROcodone-acetaminophen (NORCO/VICODIN) 5-325 MG per tablet 1-2 tablet, 1-2 tablet, Oral, Q4H PRN, Dustin Flock, MD, 1 tablet at 01/11/19 0106   levETIRAcetam (KEPPRA) tablet 250 mg, 250 mg, Oral, q morning - 10a, Dustin Flock, MD, 250 mg at 01/11/19 0926   ondansetron (ZOFRAN) tablet 4 mg, 4 mg, Oral, Q6H PRN **OR** ondansetron (ZOFRAN) injection 4 mg, 4 mg, Intravenous, Q6H PRN, Dustin Flock, MD   venetoclax TABS 200 mg, 200 mg, Oral, Daily, Dustin Flock, MD   vitamin C (ASCORBIC ACID) tablet 500 mg, 500 mg, Oral, BID, Dustin Flock, MD, 500 mg at 01/11/19 8416   Physical exam:  Vitals:   01/10/19 1835 01/10/19 2249 01/11/19 0859 01/11/19 0942  BP: (!) 156/75 (!) 133/59 113/67 123/77  Pulse: 71 82 92 87  Resp: 20 16    Temp: (!) 97.5 F (36.4 C) 98.2 F (36.8 C) 97.8 F (36.6 C)   TempSrc: Oral  Oral   SpO2:  92% 99%   Weight:      Height:       Physical Exam  Constitutional: She is oriented to person, place, and time. No distress.  Lying in bed with no acute distress  HENT:  Head: Normocephalic and atraumatic.  Eyes:  Right subconjunctival hemorrhage  Neck: Normal range of motion. Neck supple.  Cardiovascular: Normal rate.  No murmur heard. Pulmonary/Chest: Effort normal.  Breathing via nasal cannula oxygen.  Abdominal: Soft. Bowel sounds are normal.  Musculoskeletal: Normal range of motion.  Lymphadenopathy:    She has no cervical adenopathy.  Neurological: She is alert and oriented to person, place, and time.  Skin: Skin is warm.  Psychiatric: Affect normal.        CMP Latest Ref Rng & Units 01/10/2019  Glucose 70 - 99 mg/dL 137(H)  BUN 8 - 23 mg/dL 19  Creatinine 0.44 - 1.00 mg/dL 0.54  Sodium 135 - 145 mmol/L 137  Potassium 3.5 - 5.1 mmol/L 3.5  Chloride  98 - 111 mmol/L 103  CO2 22 - 32 mmol/L 25  Calcium 8.9 - 10.3 mg/dL 8.7(L)  Total Protein 6.5 - 8.1 g/dL 6.5  Total Bilirubin 0.3 - 1.2 mg/dL 0.8  Alkaline Phos 38 - 126 U/L 89  AST 15 - 41 U/L 29  ALT 0 - 44 U/L 16   CBC Latest Ref Rng & Units 01/10/2019  WBC 4.0 - 10.5 K/uL 2.3(L)  Hemoglobin 12.0 - 15.0 g/dL 7.4(L)  Hematocrit 36.0 - 46.0 % 23.4(L)  Platelets 150 - 400 K/uL 30(L)   RADIOGRAPHIC STUDIES: I have personally reviewed the radiological images as listed and agreed with the findings in the report.  Dg Chest 2 View  Result Date: 01/10/2019 CLINICAL DATA:  RIGHT chest pain following fall.  Initial encounter. EXAM: CHEST - 2 VIEW COMPARISON:  None. FINDINGS: Cardiomegaly noted. A LEFT PICC line with tip overlying the LOWER SVC noted. Mild interstitial opacities bilaterally are identified. Trace pleural effusions are noted. No focal airspace disease or pneumothorax. Fractures of the RIGHT 5th through 8th ribs noted. Mildly impacted RIGHT humeral neck fracture extending into the head is identified. RIGHT breast/axillary surgical clips noted. IMPRESSION: 1. RIGHT 5th through 8th rib fractures. No evidence of pneumothorax. 2. RIGHT humeral neck/head fracture. 3. Cardiomegaly with mild interstitial opacities of uncertain chronicity. With trace bilateral pleural effusions, these interstitial opacities may represent mild interstitial edema. Electronically Signed   By: Margarette Canada M.D.   On: 01/10/2019 08:43   Dg Shoulder Right  Result Date: 01/10/2019 CLINICAL DATA:  Acute RIGHT shoulder pain following fall. Initial encounter. EXAM: RIGHT SHOULDER - 2+ VIEW COMPARISON:  None. FINDINGS: A mildly impacted humeral neck fracture is noted extending into the head and involving the greater tuberosity. No shoulder dislocation. Fractures of the RIGHT 5th through 8th ribs noted.  No pneumothorax. IMPRESSION: 1. Mildly impacted humeral neck fracture extending into the head and greater tuberosity. 2.  RIGHT 5th through 8th rib fractures.  No pneumothorax. Electronically Signed   By: Margarette Canada M.D.   On: 01/10/2019 08:46   Ct Head Wo Contrast  Result Date: 01/10/2019 CLINICAL DATA:  Per neurosurgery repeat CT  scan of the head in 6 hours. Pt ems from [facility] s/p fall mechanical pt c/o of rt shoulder pain. EXAM: CT HEAD WITHOUT CONTRAST TECHNIQUE: Contiguous axial images were obtained from the base of the skull through the vertex without intravenous contrast. COMPARISON:  01/10/2019 at 8:30 a.m. FINDINGS: Brain: No change in the small focus of hemorrhage noted along the anterior right frontal lobe. No new intracranial hemorrhage. No hydrocephalus or evidence of an ischemic infarct. Atrophy and mild chronic microvascular ischemic change is stable from the earlier exam. Vascular: No hyperdense vessel or unexpected calcification. Skull: Normal. Negative for fracture or focal lesion. Sinuses/Orbits: No acute finding. Other: None. IMPRESSION: 1. No change from the study obtained earlier today. 2. Trace posttraumatic hemorrhage along the anterior right frontal lobe. No new hemorrhage. Electronically Signed   By: Lajean Manes M.D.   On: 01/10/2019 15:30   Ct Head Wo Contrast  Addendum Date: 01/10/2019   ADDENDUM REPORT: 01/10/2019 09:31 ADDENDUM: Study discussed by telephone with Dr. Conni Slipper on 01/10/2019 at 0921 hours. Electronically Signed   By: Genevie Ann M.D.   On: 01/10/2019 09:31   Result Date: 01/10/2019 CLINICAL DATA:  85 year old female status post fall this morning. Right orbits/frontal injury. Neck pain. EXAM: CT HEAD WITHOUT CONTRAST CT CERVICAL SPINE WITHOUT CONTRAST TECHNIQUE: Multidetector CT imaging of the head and cervical spine was performed following the standard protocol without intravenous contrast. Multiplanar CT image reconstructions of the cervical spine were also generated. COMPARISON:  CTA head 01/10/2017. CT face 01/09/2017. FINDINGS: CT HEAD FINDINGS Brain: Stable  cerebral volume, normal for age. No midline shift, mass effect, or evidence of intracranial mass lesion. No ventriculomegaly. No intraventricular hemorrhage identified. Trace acute hemorrhage along the right anterior frontal convexity on series 2, image 13 appears to be subarachnoid rather than a hemorrhagic cerebral contusion. No other acute intracranial hemorrhage identified. Stable gray-white matter differentiation throughout the brain. No cortically based acute infarct identified. No cortical encephalomalacia. Normal basilar cisterns. Vascular: Calcified atherosclerosis at the skull base. No suspicious intracranial vascular hyperdensity. Skull: Intact. Sinuses/Orbits: Trace to mild paranasal sinus mucosal thickening is new since 2018. Tympanic cavities and mastoids remain clear. Other: Right posterior convexity scalp hematoma measuring up to 4 millimeters in thickness. Underlying calvarium intact. Stable and negative other orbit and scalp soft tissues. CT CERVICAL SPINE FINDINGS Alignment: Reversal of upper cervical lordosis is chronic and appears stable since 2018. Bilateral posterior element alignment is within normal limits. Cervicothoracic junction alignment is within normal limits. Skull base and vertebrae: Visualized skull base is intact. No atlanto-occipital dissociation. Congenital incomplete ossification of the posterior C1 ring. No acute osseous abnormality identified. Soft tissues and spinal canal: No prevertebral fluid or swelling. No visible canal hematoma. Disc levels: Widespread advanced cervical spine disc and endplate degeneration. Progressed left facet degeneration at C2-C3 since 2018. Mild if any associated cervical spinal stenosis. Upper chest: Grossly intact visible upper thoracic levels. Negative lung apices. IMPRESSION: 1. Positive for trace posttraumatic hemorrhage along the right anterior frontal lobe, favor trace subarachnoid over small hemorrhagic contusion. 2. No intracranial mass  effect and no other acute traumatic injury to the brain identified. 3. Right posterior convexity scalp hematoma without underlying fracture. 4. No acute traumatic injury identified in the cervical spine. Electronically Signed: By: Genevie Ann M.D. On: 01/10/2019 09:09   Ct Cervical Spine Wo Contrast  Addendum Date: 01/10/2019   ADDENDUM REPORT: 01/10/2019 09:31 ADDENDUM: Study discussed by telephone with Dr. Conni Slipper on 01/10/2019 at 0921 hours.  Electronically Signed   By: Genevie Ann M.D.   On: 01/10/2019 09:31   Result Date: 01/10/2019 CLINICAL DATA:  85 year old female status post fall this morning. Right orbits/frontal injury. Neck pain. EXAM: CT HEAD WITHOUT CONTRAST CT CERVICAL SPINE WITHOUT CONTRAST TECHNIQUE: Multidetector CT imaging of the head and cervical spine was performed following the standard protocol without intravenous contrast. Multiplanar CT image reconstructions of the cervical spine were also generated. COMPARISON:  CTA head 01/10/2017. CT face 01/09/2017. FINDINGS: CT HEAD FINDINGS Brain: Stable cerebral volume, normal for age. No midline shift, mass effect, or evidence of intracranial mass lesion. No ventriculomegaly. No intraventricular hemorrhage identified. Trace acute hemorrhage along the right anterior frontal convexity on series 2, image 13 appears to be subarachnoid rather than a hemorrhagic cerebral contusion. No other acute intracranial hemorrhage identified. Stable gray-white matter differentiation throughout the brain. No cortically based acute infarct identified. No cortical encephalomalacia. Normal basilar cisterns. Vascular: Calcified atherosclerosis at the skull base. No suspicious intracranial vascular hyperdensity. Skull: Intact. Sinuses/Orbits: Trace to mild paranasal sinus mucosal thickening is new since 2018. Tympanic cavities and mastoids remain clear. Other: Right posterior convexity scalp hematoma measuring up to 4 millimeters in thickness. Underlying calvarium intact.  Stable and negative other orbit and scalp soft tissues. CT CERVICAL SPINE FINDINGS Alignment: Reversal of upper cervical lordosis is chronic and appears stable since 2018. Bilateral posterior element alignment is within normal limits. Cervicothoracic junction alignment is within normal limits. Skull base and vertebrae: Visualized skull base is intact. No atlanto-occipital dissociation. Congenital incomplete ossification of the posterior C1 ring. No acute osseous abnormality identified. Soft tissues and spinal canal: No prevertebral fluid or swelling. No visible canal hematoma. Disc levels: Widespread advanced cervical spine disc and endplate degeneration. Progressed left facet degeneration at C2-C3 since 2018. Mild if any associated cervical spinal stenosis. Upper chest: Grossly intact visible upper thoracic levels. Negative lung apices. IMPRESSION: 1. Positive for trace posttraumatic hemorrhage along the right anterior frontal lobe, favor trace subarachnoid over small hemorrhagic contusion. 2. No intracranial mass effect and no other acute traumatic injury to the brain identified. 3. Right posterior convexity scalp hematoma without underlying fracture. 4. No acute traumatic injury identified in the cervical spine. Electronically Signed: By: Genevie Ann M.D. On: 01/10/2019 09:09   Ct Shoulder Right Wo Contrast  Result Date: 01/10/2019 CLINICAL DATA:  Right shoulder pain after a fall today. Initial encounter. EXAM: CT OF THE UPPER RIGHT EXTREMITY WITHOUT CONTRAST TECHNIQUE: Multidetector CT imaging of the upper right extremity was performed according to the standard protocol. COMPARISON:  Plain films right shoulder earlier today. FINDINGS: Bones/Joint/Cartilage As seen on the comparison study, the patient has a transverse fracture through the metaphysis of the humerus. The articular surface of the humeral head is rotated medially due to retraction by the rotator cuff. There is mild anterior displacement of the  fracture. Nondisplaced component extends into the anterior aspect of the greater tuberosity. The lesser tuberosity is spared. The humeral head is located and the acromioclavicular joint is intact. Also seen are acute fractures of the posterior arcs of the right fifth, sixth, seventh and eighth ribs. No other fracture is identified. Ligaments Suboptimally assessed by CT. Muscles and Tendons Visualization of the rotator cuff is somewhat limited on CT but no fracture seen. Soft tissues There is some hemorrhage in the subacromial/subdeltoid bursa due to the patient's fracture. Small right pleural effusion is noted. No pneumothorax is identified but the anterior aspect of the right lung is seen only near the  apex. Imaged lung parenchyma is clear with the exception of mild dependent atelectasis. IMPRESSION: Transverse fracture through the metaphysis of the humerus with mild impaction includes a nondisplaced component in the anterior aspect of the greater tuberosity. The humeral head is rotated medially due to retraction by the rotator cuff. Acute right fifth through eighth rib fractures with an associated small pleural effusion. No pneumothorax is seen but the anterior margin of the lung is only seen at the apex. Hemorrhage in the subacromial/subdeltoid bursa due to the patient's fracture. Electronically Signed   By: Inge Rise M.D.   On: 01/10/2019 15:34   Ct Bone Marrow Biopsy & Aspiration  Result Date: 12/24/2018 INDICATION: Acute myeloid leukemia with failed remission EXAM: CT-GUIDED BONE MARROW ASPIRATION AND BIOPSY MEDICATIONS: None. ANESTHESIA/SEDATION: Moderate (conscious) sedation was employed during this procedure. A total of Versed 1 mg and Fentanyl 25 mcg was administered intravenously. Moderate Sedation Time: 16 minutes. The patient's level of consciousness and vital signs were monitored continuously by radiology nursing throughout the procedure under my direct supervision. FLUOROSCOPY TIME:  Not  applicable COMPLICATIONS: None immediate. PROCEDURE: Informed written consent was obtained from the patient after a thorough discussion of the procedural risks, benefits and alternatives. All questions were addressed. Maximal Sterile Barrier Technique was utilized including caps, mask, sterile gowns, sterile gloves, sterile drape, hand hygiene and skin antiseptic. A timeout was performed prior to the initiation of the procedure. Utilizing 0.25% Marcaine as a local and deep periosteal anesthetic and CT guidance, an Arrow OnControl bone biopsy needle was placed percutaneously into the left iliac bone. Initial aspiration samples were obtained. Subsequently 2 core biopsy passes were obtained and sent for pathologic evaluation. These were deemed adequate. The needle was removed. Hemostasis was obtained at the puncture site. Patient tolerated the procedure well and was returned to her room in satisfactory condition. IMPRESSION: Successful CT-guided bone marrow biopsy and aspiration as described. Electronically Signed   By: Inez Catalina M.D.   On: 12/24/2018 09:41   Mm 3d Screen Breast Bilateral  Result Date: 12/20/2018 CLINICAL DATA:  Screening. EXAM: DIGITAL SCREENING BILATERAL MAMMOGRAM WITH TOMO AND CAD COMPARISON:  Previous exam(s). ACR Breast Density Category b: There are scattered areas of fibroglandular density. FINDINGS: There are no findings suspicious for malignancy. Images were processed with CAD. IMPRESSION: No mammographic evidence of malignancy. A result letter of this screening mammogram will be mailed directly to the patient. RECOMMENDATION: Screening mammogram in one year. (Code:SM-B-01Y) BI-RADS CATEGORY  1: Negative. Electronically Signed   By: Curlene Dolphin M.D.   On: 12/20/2018 11:41    Assessment and plan- Patient is a 85 y.o. female who has acute leukemia on chemotherapy with Venetoclax and Vidaza,  Pancytopenia present to emergency room for evaluation after fall. CT findings of  subarachnoid hemorrhage and also right humeral fracture, and rib fracture.  #Thrombocytopenia, platelet count is 30,000.  Status post 1 unit of platelet transfusion. I will repeat CBC today for reevaluation.  Keep platelet count above 50,000.  #Subarachnoid hemorrhage, continue neuro check.  #Right humeral fracture and rib fracture, was seen by orthopedic surgeon Dr. Marry Guan.  Continue immobilization and conservative measures. #Status post fall, high fall risk. #, AML, on chemotherapy with Vidaza and venetoclax. hold Venetoclax due to low blood counts.   She reports feeling very tired and fatigued, likely secondary to anemia and chemotherapy. Symptomatic anemia, hemoglobin was 7.4 yesterday. Repeat CBC today. Results reviewed.  Plan transfuse 1 unit of irradiated PRBC and 1 unit of platelets   #  Neutropenia, due to AML and chemotherapy. Hold G-CSF. I will start prophylactic antibiotics with Cipro '500mg'$  BID  Discussed with Dr. Margaretmary Eddy.  Thank you for allowing me to participate in the care of this patient.  Total face to face encounter time for this patient visit was 70 min. >50% of the time was  spent in counseling and coordination of care.    Earlie Server, MD, PhD Hematology Oncology [facility] Pager- [phone] 01/11/2019

## 2019-01-11 NOTE — Evaluation (Addendum)
Physical Therapy Evaluation Patient Details Name: Kathryn Hahn MRN: [id number] DOB: 04-Dec-1933 Today's Date: 01/11/2019   History of Present Illness  Kathryn Hahn is an 85 y.o. female who arrived at hospital ED by EMS on 01/10/2019 from [facility] s/p a mechanical fall where she hit her head, complaining of R shoulder and rib pain. She currently has treatment at the cancer center for acute myeloid leukemia with failed remission. She was admitted to the hospital for subarachnoid hemorrhage, thrombocytopenia, and R rib and humerus fractures. Relevant PMH includes acute myeloid leukemia with failed remission, leukocytosis, anemia, R humeral fracture (current) MDS/MPN, tumor lysis syndorome, drug induced neutropenia, splenomegaly, hx of brest cancer, hx of kidney stones.  Radiograph 01/10/2019 notes L PICC line, fractures of R 5th through 8th ribs, R humeral neck/head fracture, R breast/axillary surgical clips, cardiomegaly with mild interstitial opacities, trace bilateral pleural effusions. Head CT 01/10/2019 trace posttraumatic hemorrhage along the right anterior frontal lobe, favor trace subarachnoid over small hemorrhagic contusion; Right posterior convexity scalp hematoma without underlying fracture. Orthopedics reccomended sling to R UE.    Clinical Impression  Patient alert and oriented x4 and able to provide a detailed history. Patient reports she has a history of being very active and independent including walking 3 miles a day about 3 months ago. More recently and prior to hospitalization, she has been restricted to ambulating independent in her home only due to low platelets and cancer-related fatigue. She states that prior to hospitalization she was independent with dressing and bathing and was able to do some housework and meal prep. Her husband is quite active and strong and helps with housework, yardwork, meal prep, and does all the driving at this point. She lives with him  in an independent living facility and she has no DME besides handrails and elevated toilet seat. Upon physical therapy evaluation, patient did not have use of her R UE due to R humeral fracture and sling. She was able to remove her blankets with min A and roll approximately 3/4 of the way to her left side with mod A before reporting that pain and fatigue were preventing her from continuing and she declined to make further attempts at mobility. Patient and nursing report she got up to the bedside commode earlier in the day with nursing assistance. Patient appears to have experienced a significant decline in functional independence and strength and would benefit from short term rehab prior to returning home at this point. She will be monitored each session for progress that allows an updated discharge recommendation. Patient would benefit from  physical therapy to address impairments and functional limitations (see PT Problem List below) to work towards stated goals and return to PLOF or maximal functional independence.      Follow Up Recommendations SNF;Supervision for mobility/OOB(pending progress in acute care setting. Patient was highly limited by pain today and has potential for improved functional independence with continued treatment)    Equipment Recommendations  3in1 (PT);Cane(quad cane or hemi-walker)    Recommendations for Other Services OT consult     Precautions / Restrictions Precautions Precautions: Shoulder Type of Shoulder Precautions: sling to R shoulder (humeral fx) Shoulder Interventions: Shoulder sling/immobilizer;At all times Precaution Booklet Issued: No Required Braces or Orthoses: Sling Restrictions Weight Bearing Restrictions: Yes RUE Weight Bearing: Non weight bearing Other Position/Activity Restrictions: Strictly NWB at all times per Dr. Marry Guan      Mobility  Bed Mobility Overal bed mobility: Needs Assistance Bed Mobility: Rolling Rolling: Mod assist(was able to  get blankets off with L hand and both feet and lift feet to hooklying position, but unable to tolerate more than 3/4 roll towards left side with mod A due to pain)         General bed mobility comments: patient attempted supine to sit transfer with head of bed slightly elevated, but unable to tolerate pain and declined to continue evaluation due to pain intolerance and fatigue. Educated patient on importance of getting up and proceeding with evaluation but patient continued to state she is unable at this time.  Transfers                 General transfer comment: Unable to transfer at this time due to pain. Reports getting to [facility] earlier in day with nursing assistance.  Ambulation/Gait             General Gait Details: Unable to attempt gait at this time due to pain  Stairs            Wheelchair Mobility    Modified Rankin (Stroke Patients Only)       Balance Overall balance assessment: Needs assistance   Sitting balance-Leahy Scale: Zero Sitting balance - Comments: Unable to tolerate sitting at edge of bed due to pain at this time. Reported getting up to [facility] with nursing earlier in the day.     Standing balance-Leahy Scale: Zero Standing balance comment: Unable to tolerate sitting at edge of bed due to pain at this time. Reported getting up to [facility] with nursing earlier in the day.                             Pertinent Vitals/Pain Pain Assessment: Faces Faces Pain Scale: Hurts whole lot Pain Location: R shoulder and ribs only when moving. Reports no pain at rest. Pain prevents bed moblity from supine to sit. Pain Descriptors / Indicators: Moaning Pain Intervention(s): Limited activity within patient's tolerance;Monitored during session;Patient requesting pain meds-RN notified;Repositioned    Home Living Family/patient expects to be discharged to:: Private residence([facility]) Living Arrangements: Spouse/significant  other Available Help at Discharge: Family;Available 24 hours/day(unless husband must go out for groceries, etc.) Type of Home: Independent living facility Home Access: Elevator     Home Layout: One level Home Equipment: Grab bars - tub/shower;Toilet riser;Grab bars - toilet Additional Comments: patient reports she does not have a cane, walker, or BSC    Prior Function Level of Independence: Independent         Comments: Patient reports up until 3 months ago she ambulated 3 miles a day at a 15 min mile pace. More recently she has been ambulating independently in the home only with no AD and not using furnature or walls for support. She is I with ADLs and requires some help with meal prep, housework, yardwork, and transportation.     Hand Dominance   Dominant Hand: Right    Extremity/Trunk Assessment   Upper Extremity Assessment Upper Extremity Assessment: RUE deficits/detail;LUE deficits/detail RUE: Unable to fully assess due to pain;Unable to fully assess due to immobilization LUE Deficits / Details: generalized weakness in L UE, PICC line present, pt reports non-dominant hand.    Lower Extremity Assessment Lower Extremity Assessment: Generalized weakness    Cervical / Trunk Assessment Cervical / Trunk Assessment: Other exceptions(Ribs very painful upon movement, restricts movement) Cervical / Trunk Exceptions: Ribs very painful upon movement, restricts movement  Communication   Communication: [facility]  Cognition Arousal/Alertness: Awake/alert Behavior During Therapy: WFL for tasks assessed/performed Overall Cognitive Status: Within Functional Limits for tasks assessed                                 General Comments: Patient reports extremem fatigue but is awake and alert      General Comments      Exercises Other Exercises Other Exercises: educated patient on the importance of mobility in the acute care setting. Educated on bed mobility techniques to  minimize pain and improve function.   Assessment/Plan    PT Assessment Patient needs continued PT services  PT Problem List Decreased strength;Decreased mobility;Decreased range of motion;Decreased knowledge of precautions;Decreased activity tolerance;Cardiopulmonary status limiting activity;Decreased balance;Decreased knowledge of use of DME;Pain       PT Treatment Interventions DME instruction;Therapeutic activities;Gait training;Therapeutic exercise;Patient/family education;Balance training;Functional mobility training;Neuromuscular re-education;Manual techniques    PT Goals (Current goals can be found in the Care Plan section)  Acute Rehab PT Goals Patient Stated Goal: return home and return to PLOF PT Goal Formulation: With patient Time For Goal Achievement: 01/25/19 Potential to Achieve Goals: Fair    Frequency 7X/week   Barriers to discharge Inaccessible home environment;Decreased caregiver support patient will need a higher level of support at this point to be able to complete basic mobility    Co-evaluation               AM-PAC PT "6 Clicks" Mobility  Outcome Measure Help needed turning from your back to your side while in a flat bed without using bedrails?: Total Help needed moving from lying on your back to sitting on the side of a flat bed without using bedrails?: Total Help needed moving to and from a bed to a chair (including a wheelchair)?: Total Help needed standing up from a chair using your arms (e.g., wheelchair or bedside chair)?: Total Help needed to walk in hospital room?: Total Help needed climbing 3-5 steps with a railing? : Total 6 Click Score: 6    End of Session Equipment Utilized During Treatment: Other (comment)(L shoulder sling; 2L/min O2 for most of session, trial of RA during mobility attempts and SpO2 dropped from 100% to 93%.) Activity Tolerance: Patient limited by pain;Patient limited by fatigue Patient left: in bed;with chair alarm  set;with call bell/phone within reach Nurse Communication: Mobility status;Patient requests pain meds;Weight bearing status PT Visit Diagnosis: Unsteadiness on feet (R26.81);History of falling (Z91.81);Difficulty in walking, not elsewhere classified (R26.2);Muscle weakness (generalized) (M62.81)    Time: 2122-4825 PT Time Calculation (min) (ACUTE ONLY): 25 min   Charges:   PT Evaluation $PT Eval Moderate Complexity: 1 Mod PT Treatments $Therapeutic Activity: 8-22 mins       Everlean Alstrom. Graylon Good, PT, DPT 01/11/19, 1:07 PM   Addendum to include updated WB restrictions per Dr. Lake Bells.  Everlean Alstrom. Graylon Good, PT, DPT 01/11/19, 2:52 PM

## 2019-01-11 NOTE — Progress Notes (Signed)
Neurology:  85 y.o. right-hand dominant female who slipped and fell in her bathroom, striking her right shoulder and the right side of her head. She was noted to have a small subarachnoid hemorrhage   - Repeat CTH no change - Traumatic SAH, no further imaging needed.

## 2019-01-11 NOTE — Progress Notes (Signed)
[facility] [place] NAME: Kathryn Hahn    MR#:  [id number]  DATE OF BIRTH:  12-20-1933  SUBJECTIVE:  CHIEF COMPLAINT:  Pt is feeling ok , resting comfortably.  Denies any headache or blurry vision or new weakness.  Answering questions appropriately  REVIEW OF SYSTEMS:  CONSTITUTIONAL: No fever, fatigue or weakness.  EYES: No blurred or double vision.  EARS, NOSE, AND THROAT: No tinnitus or ear pain.  RESPIRATORY: No cough, shortness of breath, wheezing or hemoptysis.  CARDIOVASCULAR: No chest pain, orthopnea, edema.  GASTROINTESTINAL: No nausea, vomiting, diarrhea or abdominal pain.  GENITOURINARY: No dysuria, hematuria.  ENDOCRINE: No polyuria, nocturia,  HEMATOLOGY: No anemia, easy bruising or bleeding SKIN: No rash or lesion. MUSCULOSKELETAL: No joint pain or arthritis.   NEUROLOGIC: No tingling, numbness, weakness.  PSYCHIATRY: No anxiety or depression.   DRUG ALLERGIES:   Allergies  Allergen Reactions  . Terbinafine Rash and Swelling    VITALS:  Blood pressure 123/77, pulse 87, temperature 97.8 F (36.6 C), temperature source Oral, resp. rate 16, height 4\' 10"  (1.473 m), weight 43.1 kg, SpO2 99 %.  PHYSICAL EXAMINATION:  GENERAL:  85 y.o.-year-old patient lying in the bed with no acute distress.  EYES: Pupils equal, round, reactive to light and accommodation. No scleral icterus. Extraocular muscles intact.  Positive right conjunctival hemorrhage HEENT: Head atraumatic, normocephalic. Oropharynx and nasopharynx clear.  NECK:  Supple, no jugular venous distention. No thyroid enlargement, no tenderness.  LUNGS: Normal breath sounds bilaterally, no wheezing, rales,rhonchi or crepitation. No use of accessory muscles of respiration.  CARDIOVASCULAR: S1, S2 normal. No murmurs, rubs, or gallops.  ABDOMEN: Soft, nontender, nondistended. Bowel sounds present. EXTREMITIES: No pedal edema, cyanosis, or clubbing.   NEUROLOGIC: Cranial nerves II through XII are intact. Muscle strength at her baseline in all extremities. Sensation intact. Gait not checked.  PSYCHIATRIC: The patient is alert and oriented x 3.  SKIN: No obvious rash, lesion, or ulcer.    LABORATORY PANEL:   CBC Recent Labs  Lab 01/10/19 0808  WBC 2.3*  HGB 7.4*  HCT 23.4*  PLT 30*   ------------------------------------------------------------------------------------------------------------------  Chemistries  Recent Labs  Lab 01/10/19 0808  NA 137  K 3.5  CL 103  CO2 25  GLUCOSE 137*  BUN 19  CREATININE 0.54  CALCIUM 8.7*  AST 29  ALT 16  ALKPHOS 89  BILITOT 0.8   ------------------------------------------------------------------------------------------------------------------  Cardiac Enzymes No results for input(s): TROPONINI in the last 168 hours. ------------------------------------------------------------------------------------------------------------------  RADIOLOGY:  Dg Chest 2 View  Result Date: 01/10/2019 CLINICAL DATA:  RIGHT chest pain following fall.  Initial encounter. EXAM: CHEST - 2 VIEW COMPARISON:  None. FINDINGS: Cardiomegaly noted. A LEFT PICC line with tip overlying the LOWER SVC noted. Mild interstitial opacities bilaterally are identified. Trace pleural effusions are noted. No focal airspace disease or pneumothorax. Fractures of the RIGHT 5th through 8th ribs noted. Mildly impacted RIGHT humeral neck fracture extending into the head is identified. RIGHT breast/axillary surgical clips noted. IMPRESSION: 1. RIGHT 5th through 8th rib fractures. No evidence of pneumothorax. 2. RIGHT humeral neck/head fracture. 3. Cardiomegaly with mild interstitial opacities of uncertain chronicity. With trace bilateral pleural effusions, these interstitial opacities may represent mild interstitial edema. Electronically Signed   By: Margarette Canada M.D.   On: 01/10/2019 08:43   Dg Shoulder Right  Result Date:  01/10/2019 CLINICAL DATA:  Acute RIGHT shoulder pain following fall. Initial encounter. EXAM: RIGHT SHOULDER - 2+ VIEW COMPARISON:  None. FINDINGS: A mildly impacted humeral neck fracture is noted extending into the head and involving the greater tuberosity. No shoulder dislocation. Fractures of the RIGHT 5th through 8th ribs noted.  No pneumothorax. IMPRESSION: 1. Mildly impacted humeral neck fracture extending into the head and greater tuberosity. 2. RIGHT 5th through 8th rib fractures.  No pneumothorax. Electronically Signed   By: Margarette Canada M.D.   On: 01/10/2019 08:46   Ct Head Wo Contrast  Result Date: 01/10/2019 CLINICAL DATA:  Per neurosurgery repeat CT scan of the head in 6 hours. Pt ems from [facility] s/p fall mechanical pt c/o of rt shoulder pain. EXAM: CT HEAD WITHOUT CONTRAST TECHNIQUE: Contiguous axial images were obtained from the base of the skull through the vertex without intravenous contrast. COMPARISON:  01/10/2019 at 8:30 a.m. FINDINGS: Brain: No change in the small focus of hemorrhage noted along the anterior right frontal lobe. No new intracranial hemorrhage. No hydrocephalus or evidence of an ischemic infarct. Atrophy and mild chronic microvascular ischemic change is stable from the earlier exam. Vascular: No hyperdense vessel or unexpected calcification. Skull: Normal. Negative for fracture or focal lesion. Sinuses/Orbits: No acute finding. Other: None. IMPRESSION: 1. No change from the study obtained earlier today. 2. Trace posttraumatic hemorrhage along the anterior right frontal lobe. No new hemorrhage. Electronically Signed   By: Lajean Manes M.D.   On: 01/10/2019 15:30   Ct Head Wo Contrast  Addendum Date: 01/10/2019   ADDENDUM REPORT: 01/10/2019 09:31 ADDENDUM: Study discussed by telephone with Dr. Conni Slipper on 01/10/2019 at 0921 hours. Electronically Signed   By: Genevie Ann M.D.   On: 01/10/2019 09:31   Result Date: 01/10/2019 CLINICAL DATA:  85 year old female  status post fall this morning. Right orbits/frontal injury. Neck pain. EXAM: CT HEAD WITHOUT CONTRAST CT CERVICAL SPINE WITHOUT CONTRAST TECHNIQUE: Multidetector CT imaging of the head and cervical spine was performed following the standard protocol without intravenous contrast. Multiplanar CT image reconstructions of the cervical spine were also generated. COMPARISON:  CTA head 01/10/2017. CT face 01/09/2017. FINDINGS: CT HEAD FINDINGS Brain: Stable cerebral volume, normal for age. No midline shift, mass effect, or evidence of intracranial mass lesion. No ventriculomegaly. No intraventricular hemorrhage identified. Trace acute hemorrhage along the right anterior frontal convexity on series 2, image 13 appears to be subarachnoid rather than a hemorrhagic cerebral contusion. No other acute intracranial hemorrhage identified. Stable gray-white matter differentiation throughout the brain. No cortically based acute infarct identified. No cortical encephalomalacia. Normal basilar cisterns. Vascular: Calcified atherosclerosis at the skull base. No suspicious intracranial vascular hyperdensity. Skull: Intact. Sinuses/Orbits: Trace to mild paranasal sinus mucosal thickening is new since 2018. Tympanic cavities and mastoids remain clear. Other: Right posterior convexity scalp hematoma measuring up to 4 millimeters in thickness. Underlying calvarium intact. Stable and negative other orbit and scalp soft tissues. CT CERVICAL SPINE FINDINGS Alignment: Reversal of upper cervical lordosis is chronic and appears stable since 2018. Bilateral posterior element alignment is within normal limits. Cervicothoracic junction alignment is within normal limits. Skull base and vertebrae: Visualized skull base is intact. No atlanto-occipital dissociation. Congenital incomplete ossification of the posterior C1 ring. No acute osseous abnormality identified. Soft tissues and spinal canal: No prevertebral fluid or swelling. No visible canal  hematoma. Disc levels: Widespread advanced cervical spine disc and endplate degeneration. Progressed left facet degeneration at C2-C3 since 2018. Mild if any associated cervical spinal stenosis. Upper chest: Grossly intact visible upper thoracic levels. Negative lung apices. IMPRESSION: 1. Positive for trace posttraumatic  hemorrhage along the right anterior frontal lobe, favor trace subarachnoid over small hemorrhagic contusion. 2. No intracranial mass effect and no other acute traumatic injury to the brain identified. 3. Right posterior convexity scalp hematoma without underlying fracture. 4. No acute traumatic injury identified in the cervical spine. Electronically Signed: By: Genevie Ann M.D. On: 01/10/2019 09:09   Ct Cervical Spine Wo Contrast  Addendum Date: 01/10/2019   ADDENDUM REPORT: 01/10/2019 09:31 ADDENDUM: Study discussed by telephone with Dr. Conni Slipper on 01/10/2019 at 0921 hours. Electronically Signed   By: Genevie Ann M.D.   On: 01/10/2019 09:31   Result Date: 01/10/2019 CLINICAL DATA:  85 year old female status post fall this morning. Right orbits/frontal injury. Neck pain. EXAM: CT HEAD WITHOUT CONTRAST CT CERVICAL SPINE WITHOUT CONTRAST TECHNIQUE: Multidetector CT imaging of the head and cervical spine was performed following the standard protocol without intravenous contrast. Multiplanar CT image reconstructions of the cervical spine were also generated. COMPARISON:  CTA head 01/10/2017. CT face 01/09/2017. FINDINGS: CT HEAD FINDINGS Brain: Stable cerebral volume, normal for age. No midline shift, mass effect, or evidence of intracranial mass lesion. No ventriculomegaly. No intraventricular hemorrhage identified. Trace acute hemorrhage along the right anterior frontal convexity on series 2, image 13 appears to be subarachnoid rather than a hemorrhagic cerebral contusion. No other acute intracranial hemorrhage identified. Stable gray-white matter differentiation throughout the brain. No cortically  based acute infarct identified. No cortical encephalomalacia. Normal basilar cisterns. Vascular: Calcified atherosclerosis at the skull base. No suspicious intracranial vascular hyperdensity. Skull: Intact. Sinuses/Orbits: Trace to mild paranasal sinus mucosal thickening is new since 2018. Tympanic cavities and mastoids remain clear. Other: Right posterior convexity scalp hematoma measuring up to 4 millimeters in thickness. Underlying calvarium intact. Stable and negative other orbit and scalp soft tissues. CT CERVICAL SPINE FINDINGS Alignment: Reversal of upper cervical lordosis is chronic and appears stable since 2018. Bilateral posterior element alignment is within normal limits. Cervicothoracic junction alignment is within normal limits. Skull base and vertebrae: Visualized skull base is intact. No atlanto-occipital dissociation. Congenital incomplete ossification of the posterior C1 ring. No acute osseous abnormality identified. Soft tissues and spinal canal: No prevertebral fluid or swelling. No visible canal hematoma. Disc levels: Widespread advanced cervical spine disc and endplate degeneration. Progressed left facet degeneration at C2-C3 since 2018. Mild if any associated cervical spinal stenosis. Upper chest: Grossly intact visible upper thoracic levels. Negative lung apices. IMPRESSION: 1. Positive for trace posttraumatic hemorrhage along the right anterior frontal lobe, favor trace subarachnoid over small hemorrhagic contusion. 2. No intracranial mass effect and no other acute traumatic injury to the brain identified. 3. Right posterior convexity scalp hematoma without underlying fracture. 4. No acute traumatic injury identified in the cervical spine. Electronically Signed: By: Genevie Ann M.D. On: 01/10/2019 09:09   Ct Shoulder Right Wo Contrast  Result Date: 01/10/2019 CLINICAL DATA:  Right shoulder pain after a fall today. Initial encounter. EXAM: CT OF THE UPPER RIGHT EXTREMITY WITHOUT CONTRAST  TECHNIQUE: Multidetector CT imaging of the upper right extremity was performed according to the standard protocol. COMPARISON:  Plain films right shoulder earlier today. FINDINGS: Bones/Joint/Cartilage As seen on the comparison study, the patient has a transverse fracture through the metaphysis of the humerus. The articular surface of the humeral head is rotated medially due to retraction by the rotator cuff. There is mild anterior displacement of the fracture. Nondisplaced component extends into the anterior aspect of the greater tuberosity. The lesser tuberosity is spared. The humeral head is located  and the acromioclavicular joint is intact. Also seen are acute fractures of the posterior arcs of the right fifth, sixth, seventh and eighth ribs. No other fracture is identified. Ligaments Suboptimally assessed by CT. Muscles and Tendons Visualization of the rotator cuff is somewhat limited on CT but no fracture seen. Soft tissues There is some hemorrhage in the subacromial/subdeltoid bursa due to the patient's fracture. Small right pleural effusion is noted. No pneumothorax is identified but the anterior aspect of the right lung is seen only near the apex. Imaged lung parenchyma is clear with the exception of mild dependent atelectasis. IMPRESSION: Transverse fracture through the metaphysis of the humerus with mild impaction includes a nondisplaced component in the anterior aspect of the greater tuberosity. The humeral head is rotated medially due to retraction by the rotator cuff. Acute right fifth through eighth rib fractures with an associated small pleural effusion. No pneumothorax is seen but the anterior margin of the lung is only seen at the apex. Hemorrhage in the subacromial/subdeltoid bursa due to the patient's fracture. Electronically Signed   By: Inge Rise M.D.   On: 01/10/2019 15:34    EKG:   Orders placed or performed in visit on 01/10/19  . EKG 12-Lead  . EKG 12-Lead  . EKG 12-Lead     ASSESSMENT AND PLAN:    1.  Subarachnoid hemorrhage which is very minimal.   The ED physician discussed the case with neurosurgeon at [facility] Dr. Araceli Bouche who reviewed the imaging who states that patient does not need transfer recommends Keppra at 250 mg daily for 7 days.   He also stated that patient did not need a repeat imaging unless there are changes in her mental status Clinically stable.  Will watch closely and monitor neurochecks -Neurology consulted and discussed with Dr. Irish Elders, recommending close clinical monitoring  2.  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  Consult placed to Dr. Tasia Catchings, discussed with her she will see the patient Dr. Rogue Bussing is recommending to hold chemotherapy until seen and evaluated during the follow-up visit after discharge  3.  Right rib and right humerus fracture  pain control  orthopedic evaluation recommending to continue with immobilization of the right shoulder and elevate head of the bed for comfort.  Ice therapy Shoulder sling  4.  AML -follow-up with oncology.  Discussed with Dr. Tasia Catchings  5.  Essential hypertension blood pressure is currently normal I will discontinue atenolol hold HCTZ  6.  Miscellaneous SCDs for DVT prophylaxis    All the records are reviewed and case discussed with Care Management/Social Workerr. Management plans discussed with the patient, granddaughter over phone-Allie-[phone] and they are in agreement.  CODE STATUS: fc  TOTAL TIME TAKING CARE OF THIS PATIENT: 67minutes.   POSSIBLE D/C IN 1-2 DAYS, DEPENDING ON CLINICAL CONDITION.  Note: This dictation was prepared with Dragon dictation along with smaller phrase technology. Any transcriptional errors that result from this process are unintentional.   Nicholes Mango M.D on 01/11/2019 at 3:24 PM  Between 7am to 6pm - Pager - [phone] After 6pm go to  [email or web address] - password [email or web address] [facility]  Office  [phone]  CC: Primary care physician; Rusty Aus, MD

## 2019-01-12 LAB — CBC
HCT: 21.6 % — ABNORMAL LOW (ref 36.0–46.0)
Hemoglobin: 6.8 g/dL — ABNORMAL LOW (ref 12.0–15.0)
MCH: 24.2 pg — ABNORMAL LOW (ref 26.0–34.0)
MCHC: 31.5 g/dL (ref 30.0–36.0)
MCV: 76.9 fL — ABNORMAL LOW (ref 80.0–100.0)
Platelets: 35 10*3/uL — ABNORMAL LOW (ref 150–400)
RBC: 2.81 MIL/uL — ABNORMAL LOW (ref 3.87–5.11)
RDW: 21.4 % — ABNORMAL HIGH (ref 11.5–15.5)
WBC: 1.8 10*3/uL — ABNORMAL LOW (ref 4.0–10.5)
nRBC: 4.4 % — ABNORMAL HIGH (ref 0.0–0.2)

## 2019-01-12 LAB — PREPARE RBC (CROSSMATCH)

## 2019-01-12 LAB — HEMOGLOBIN: Hemoglobin: 8.5 g/dL — ABNORMAL LOW (ref 12.0–15.0)

## 2019-01-12 NOTE — Progress Notes (Signed)
[facility] [place] NAME: Kathryn Hahn    MR#:  [id number]  DATE OF BIRTH:  August 01, 1933  SUBJECTIVE:  CHIEF COMPLAINT:  Pt is feeling tired today hemoglobin is 6.8. Denies any headache or blurry vision or new weakness.  Answering questions appropriately  REVIEW OF SYSTEMS:  CONSTITUTIONAL: No fever, fatigue or weakness.  EYES: No blurred or double vision.  EARS, NOSE, AND THROAT: No tinnitus or ear pain.  RESPIRATORY: No cough, shortness of breath, wheezing or hemoptysis.  CARDIOVASCULAR: No chest pain, orthopnea, edema.  GASTROINTESTINAL: No nausea, vomiting, diarrhea or abdominal pain.  GENITOURINARY: No dysuria, hematuria.  ENDOCRINE: No polyuria, nocturia,  HEMATOLOGY: No anemia, easy bruising or bleeding SKIN: No rash or lesion. MUSCULOSKELETAL: No joint pain or arthritis.   NEUROLOGIC: No tingling, numbness, weakness.  PSYCHIATRY: No anxiety or depression.   DRUG ALLERGIES:   Allergies  Allergen Reactions  . Terbinafine Rash and Swelling    VITALS:  Blood pressure 124/71, pulse 85, temperature 98.2 F (36.8 C), temperature source Oral, resp. rate 18, height 4\' 10"  (1.473 m), weight 43.1 kg, SpO2 100 %.  PHYSICAL EXAMINATION:  GENERAL:  85 y.o.-year-old patient lying in the bed with no acute distress.  EYES: Pupils equal, round, reactive to light and accommodation. No scleral icterus. Extraocular muscles intact.  Positive right conjunctival hemorrhage HEENT: Head atraumatic, normocephalic. Oropharynx and nasopharynx clear.  NECK:  Supple, no jugular venous distention. No thyroid enlargement, no tenderness.  LUNGS: Normal breath sounds bilaterally, no wheezing, rales,rhonchi or crepitation. No use of accessory muscles of respiration.  CARDIOVASCULAR: S1, S2 normal. No murmurs, rubs, or gallops.  ABDOMEN: Soft, nontender, nondistended. Bowel sounds present. EXTREMITIES: No pedal edema, cyanosis, or clubbing.   NEUROLOGIC: Cranial nerves II through XII are intact. Muscle strength at her baseline in all extremities. Sensation intact. Gait not checked.  PSYCHIATRIC: The patient is alert and oriented x 3.  SKIN: No obvious rash, lesion, or ulcer.    LABORATORY PANEL:   CBC Recent Labs  Lab 01/12/19 0418 01/12/19 1209  WBC 1.8*  --   HGB 6.8* 8.5*  HCT 21.6*  --   PLT 35*  --    ------------------------------------------------------------------------------------------------------------------  Chemistries  Recent Labs  Lab 01/10/19 0808  NA 137  K 3.5  CL 103  CO2 25  GLUCOSE 137*  BUN 19  CREATININE 0.54  CALCIUM 8.7*  AST 29  ALT 16  ALKPHOS 89  BILITOT 0.8   ------------------------------------------------------------------------------------------------------------------  Cardiac Enzymes No results for input(s): TROPONINI in the last 168 hours. ------------------------------------------------------------------------------------------------------------------  RADIOLOGY:  Ct Head Wo Contrast  Result Date: 01/10/2019 CLINICAL DATA:  Per neurosurgery repeat CT scan of the head in 6 hours. Pt ems from [facility] s/p fall mechanical pt c/o of rt shoulder pain. EXAM: CT HEAD WITHOUT CONTRAST TECHNIQUE: Contiguous axial images were obtained from the base of the skull through the vertex without intravenous contrast. COMPARISON:  01/10/2019 at 8:30 a.m. FINDINGS: Brain: No change in the small focus of hemorrhage noted along the anterior right frontal lobe. No new intracranial hemorrhage. No hydrocephalus or evidence of an ischemic infarct. Atrophy and mild chronic microvascular ischemic change is stable from the earlier exam. Vascular: No hyperdense vessel or unexpected calcification. Skull: Normal. Negative for fracture or focal lesion. Sinuses/Orbits: No acute finding. Other: None. IMPRESSION: 1. No change from the study obtained earlier today. 2. Trace posttraumatic  hemorrhage along the anterior right frontal lobe. No new hemorrhage. Electronically  Signed   By: Lajean Manes M.D.   On: 01/10/2019 15:30   Ct Shoulder Right Wo Contrast  Result Date: 01/10/2019 CLINICAL DATA:  Right shoulder pain after a fall today. Initial encounter. EXAM: CT OF THE UPPER RIGHT EXTREMITY WITHOUT CONTRAST TECHNIQUE: Multidetector CT imaging of the upper right extremity was performed according to the standard protocol. COMPARISON:  Plain films right shoulder earlier today. FINDINGS: Bones/Joint/Cartilage As seen on the comparison study, the patient has a transverse fracture through the metaphysis of the humerus. The articular surface of the humeral head is rotated medially due to retraction by the rotator cuff. There is mild anterior displacement of the fracture. Nondisplaced component extends into the anterior aspect of the greater tuberosity. The lesser tuberosity is spared. The humeral head is located and the acromioclavicular joint is intact. Also seen are acute fractures of the posterior arcs of the right fifth, sixth, seventh and eighth ribs. No other fracture is identified. Ligaments Suboptimally assessed by CT. Muscles and Tendons Visualization of the rotator cuff is somewhat limited on CT but no fracture seen. Soft tissues There is some hemorrhage in the subacromial/subdeltoid bursa due to the patient's fracture. Small right pleural effusion is noted. No pneumothorax is identified but the anterior aspect of the right lung is seen only near the apex. Imaged lung parenchyma is clear with the exception of mild dependent atelectasis. IMPRESSION: Transverse fracture through the metaphysis of the humerus with mild impaction includes a nondisplaced component in the anterior aspect of the greater tuberosity. The humeral head is rotated medially due to retraction by the rotator cuff. Acute right fifth through eighth rib fractures with an associated small pleural effusion. No pneumothorax is  seen but the anterior margin of the lung is only seen at the apex. Hemorrhage in the subacromial/subdeltoid bursa due to the patient's fracture. Electronically Signed   By: Inge Rise M.D.   On: 01/10/2019 15:34    EKG:   Orders placed or performed in visit on 01/10/19  . EKG 12-Lead  . EKG 12-Lead  . EKG 12-Lead    ASSESSMENT AND PLAN:    1.  Subarachnoid hemorrhage which is very minimal.   The ED physician discussed the case with neurosurgeon at [facility] Dr. Araceli Bouche who reviewed the imaging who states that patient does not need transfer recommends Keppra at 250 mg daily for 7 days.   He also stated that patient did not need a repeat imaging unless there are changes in her mental status Clinically stable.  Will watch closely and monitor neurochecks -Neurology consulted and discussed with Dr. Irish Elders, recommending close clinical monitoring -Hemoglobin at 6.8 will transfuse 1 unit of blood and repeat hemoglobin hematocrit 2 hours after transfusion is at 8.5 we will continue close monitoring -Check CBC in a.m.  2.  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  Consult placed to Dr. Tasia Catchings, discussed with her  Dr. Rogue Bussing is recommending to hold chemotherapy until seen and evaluated during the follow-up visit after discharge  3.  Right rib and right humerus fracture  pain control  orthopedic evaluation recommending to continue with immobilization of the right shoulder and elevate head of the bed for comfort.  Ice therapy Shoulder sling  4.  AML -follow-up with oncology.  Discussed with Dr. Tasia Catchings  5.  Essential hypertension blood pressure is currently normal I will discontinue atenolol hold HCTZ  6.  Miscellaneous SCDs for DVT prophylaxis  All the records are reviewed and case discussed with Care Management/Social Workerr. Management plans discussed with the patient, granddaughter  over phone-Allie-[phone] and they are in agreement.  CODE STATUS: fc  TOTAL TIME TAKING CARE OF THIS PATIENT: 36 minutes.   POSSIBLE D/C IN 1-2 DAYS, DEPENDING ON CLINICAL CONDITION.  Note: This dictation was prepared with Dragon dictation along with smaller phrase technology. Any transcriptional errors that result from this process are unintentional.   Nicholes Mango M.D on 01/12/2019 at 2:50 PM  Between 7am to 6pm - Pager - [phone] After 6pm go to [email or web address] - password [email or web address] [facility]  Office  [phone]  CC: Primary care physician; Rusty Aus, MD

## 2019-01-12 NOTE — TOC Initial Note (Signed)
Transition of Care [facility]) - Initial/Assessment Note    Patient Details  Name: Kathryn Hahn MRN: [id number] Date of Birth: 01-27-1934  Transition of Care [facility]) CM/SW Contact:    Fredric Mare, LCSW Phone Number: 01/12/2019, 9:39 AM  Clinical Narrative:                  Patient is an 85 year old female that presents with a fall. Patient currently lives at [facility] and shared that she slipped in the bathroom. Patient shared that this is the first time she fell, and believes that it was due to her wearing socks on a hard floor.  Patient stated that she does not use any equipment and is fairly active, and used to walk about 3 miles per day.  CSW will contact [facility] to see if patient can be admitted to their SNF. Patient agreed to this plan.     Expected Discharge Plan: [place]     Patient Goals and CMS Choice   CMS Medicare.gov Compare Post Acute Care list provided to:: Patient Choice offered to / list presented to : Patient  Expected Discharge Plan and Services Expected Discharge Plan: [place]   Discharge Planning Services: CM Consult Post Acute Care Choice: Skilled Nursing Facility(patient would like to go to SNF at [facility])                                        Prior Living Arrangements/Services   Lives with:: Facility Resident, Spouse([facility]) Patient language and need for interpreter reviewed:: Yes Do you feel safe going back to the place where you live?: Yes      Need for Family Participation in Patient Care: No (Comment) Care giver support system in place?: Yes (comment)   Criminal Activity/Legal Involvement Pertinent to Current Situation/Hospitalization: No - Comment as needed  Activities of Daily Living Home Assistive Devices/Equipment: None ADL Screening (condition at time of admission) Patient's cognitive ability adequate to safely complete daily activities?: Yes Is the  patient deaf or have difficulty hearing?: Yes Does the patient have difficulty seeing, even when wearing glasses/contacts?: No Does the patient have difficulty concentrating, remembering, or making decisions?: No Patient able to express need for assistance with ADLs?: Yes Does the patient have difficulty dressing or bathing?: No Independently performs ADLs?: Yes (appropriate for developmental age) Does the patient have difficulty walking or climbing stairs?: No Weakness of Legs: None Weakness of Arms/Hands: Right  Permission Sought/Granted                  Emotional Assessment Appearance:: Appears stated age Attitude/Demeanor/Rapport: Engaged Affect (typically observed): Calm, Accepting Orientation: : Oriented to Self, Oriented to Place, Oriented to  Time, Oriented to Situation Alcohol / Substance Use: Never Used Psych Involvement: No (comment)  Admission diagnosis:  Subarachnoid hemorrhage following injury, no loss of consciousness, initial encounter ([place]) [S06.6X0A] Fall, initial encounter [W19.XXXA] Closed fracture of multiple ribs of right side, initial encounter [S22.41XA] Other closed displaced fracture of proximal end of right humerus, initial encounter [S42.291A] Patient Active Problem List   Diagnosis Date Noted  . Subarachnoid hemorrhage following injury, no loss of consciousness ([place])   . Multiple closed fractures of ribs of right side   . Closed fracture of right proximal humerus   . Symptomatic anemia   . Thrombocytopenia ([place])   . Chemotherapy-induced neutropenia ([place])   .  SAH (subarachnoid hemorrhage) ([place]) 01/10/2019  . Drug-induced neutropenia ([place]) 12/27/2018  . AML (acute myeloid leukemia) with failed remission ([place]) 11/25/2018  . Dehydration 11/25/2018  . Tumor lysis syndrome 11/25/2018  . MDS/MPN (myelodysplastic/myeloproliferative neoplasms) ([place]) 08/28/2018  . Goals of care, counseling/discussion 08/28/2018  . Splenomegaly 02/22/2016  . Essential  hypertension 02/22/2015  . History of breast cancer 02/22/2015  . History of kidney stones 02/22/2015  . Hyperlipidemia, mixed 02/22/2015  . Vitamin D deficiency 02/22/2015   PCP:  Rusty Aus, MD Pharmacy:   [place], [place] - [address] [place] [address] Phone: [phone] Fax: [phone]  [id number] - [place], [address] [place] [address] [place] [address] Phone: [phone] Fax: [phone]     Social Determinants of Health (SDOH) Interventions    Readmission Risk Interventions No flowsheet data found.

## 2019-01-12 NOTE — Progress Notes (Signed)
PT Cancellation Note  Patient Details Name: Kathryn Hahn MRN: [id number] DOB: [birth year]   Cancelled Treatment:    Reason Eval/Treat Not Completed: Medical issues which prohibited therapy   Chart reviewed.  HgB  6.8 this am.  Will hold this am and continue as appropriate this pm as schedule allows.   Chesley Noon 01/12/2019, 1:02 PM

## 2019-01-12 NOTE — Progress Notes (Signed)
Physical Therapy Treatment Patient Details Name: Kathryn Hahn MRN: [id number] DOB: 05-Jan-1934 Today's Date: 01/12/2019    History of Present Illness Kathryn Hahn is an 85 y.o. female who arrived at hospital ED by EMS on 01/10/2019 from [facility] living s/p a mechanical fall where she hit her head, complaining of R shoulder and rib pain. She currently has treatment at the cancer center for acute myeloid leukemia with failed remission. She was admitted to the hospital for subarachnoid hemorrhage, thrombocytopenia, and R rib and humerus fractures. Relevant PMH includes acute myeloid leukemia with failed remission, leukocytosis, anemia, R humeral fracture (current) MDS/MPN, tumor lysis syndorome, drug induced neutropenia, splenomegaly, hx of brest cancer, hx of kidney stones.  Radiograph 01/10/2019 notes L PICC line, fractures of R 5th through 8th ribs, R humeral neck/head fracture, R breast/axillary surgical clips, cardiomegaly with mild interstitial opacities, trace bilateral pleural effusions. Head CT 01/10/2019 trace posttraumatic hemorrhage along the right anterior frontal lobe, favor trace subarachnoid over small hemorrhagic contusion; Right posterior convexity scalp hematoma without underlying fracture. Orthopedics reccomended sling to R UE.    PT Comments    Pt is making good progress towards goals and continues to be motivated to perform therapy. Able to ambulate in hallway this date, although does fatigue with increased distance. All mobility performed on RA, however sats decrease to 86% with exertion, 2L of O2 reapplied with sats improving to 98%. Pt able to maintain NWB on R UE. Needs assist for ADLs and transfers due to pain. Slightly unsteady with gait training, however no overt LOB. Updated recommendations. Will continue to progress.   Follow Up Recommendations  Home health PT;Supervision/Assistance - 24 hour     Equipment Recommendations  3in1 (PT)    Recommendations  for Other Services OT consult     Precautions / Restrictions Precautions Precautions: Shoulder Type of Shoulder Precautions: sling to R shoulder (humeral fx) Shoulder Interventions: Shoulder sling/immobilizer;At all times Precaution Booklet Issued: No Restrictions Weight Bearing Restrictions: Yes RUE Weight Bearing: Non weight bearing Other Position/Activity Restrictions: Strictly NWB at all times per Dr. Marry Guan    Mobility  Bed Mobility Overal bed mobility: Needs Assistance Bed Mobility: Sit to Supine;Supine to Sit     Supine to sit: Min assist Sit to supine: Min guard   General bed mobility comments: good effort this date. Needs slight assist once in sidelying position to bring trunk up to side of bed. Once seated, able to sit with supervision. O2 removed for mobility with sats at 98% at rest  Transfers Overall transfer level: Needs assistance Equipment used: None Transfers: Sit to/from Stand Sit to Stand: Min guard         General transfer comment: safe technique. steady once upright  Ambulation/Gait Ambulation/Gait assistance: Min guard Gait Distance (Feet): 200 Feet Assistive device: None Gait Pattern/deviations: Step-through pattern     General Gait Details: able to navigate RN station with reciprocal gait pattern. Slightly unsteady, however no overt LOB noted. no AD required. Short stride and small BOS   Stairs             Wheelchair Mobility    Modified Rankin (Stroke Patients Only)       Balance Overall balance assessment: Needs assistance Sitting-balance support: Single extremity supported Sitting balance-Leahy Scale: Good     Standing balance support: No upper extremity supported Standing balance-Leahy Scale: Fair  Cognition Arousal/Alertness: Awake/alert Behavior During Therapy: WFL for tasks assessed/performed Overall Cognitive Status: Within Functional Limits for tasks assessed                                         Exercises Other Exercises Other Exercises: ambulated to bathroom, needs cga for safety. Min assist for transfers on/off low toliet.     General Comments        Pertinent Vitals/Pain Pain Assessment: 0-10 Pain Score: 4  Pain Location: R shoulder and ribs only when moving. Reports no pain at rest. Pain prevents bed moblity from supine to sit. Pain Descriptors / Indicators: Moaning Pain Intervention(s): Limited activity within patient's tolerance    Home Living                      Prior Function            PT Goals (current goals can now be found in the care plan section) Acute Rehab PT Goals Patient Stated Goal: return home and return to PLOF PT Goal Formulation: With patient Time For Goal Achievement: 01/25/19 Potential to Achieve Goals: Good Progress towards PT goals: Progressing toward goals    Frequency    7X/week      PT Plan Discharge plan needs to be updated    Co-evaluation              AM-PAC PT "6 Clicks" Mobility   Outcome Measure  Help needed turning from your back to your side while in a flat bed without using bedrails?: A Little Help needed moving from lying on your back to sitting on the side of a flat bed without using bedrails?: A Little Help needed moving to and from a bed to a chair (including a wheelchair)?: A Little Help needed standing up from a chair using your arms (e.g., wheelchair or bedside chair)?: A Little Help needed to walk in hospital room?: A Little Help needed climbing 3-5 steps with a railing? : A Little 6 Click Score: 18    End of Session Equipment Utilized During Treatment: (sling) Activity Tolerance: Patient tolerated treatment well Patient left: in bed;with bed alarm set Nurse Communication: Mobility status;Patient requests pain meds;Weight bearing status PT Visit Diagnosis: Unsteadiness on feet (R26.81);History of falling (Z91.81);Difficulty in walking, not  elsewhere classified (R26.2);Muscle weakness (generalized) (M62.81)     Time: 9518-8416 PT Time Calculation (min) (ACUTE ONLY): 23 min  Charges:  $Gait Training: 23-37 mins                     Greggory Stallion, [place], DPT [phone]    Conny Situ 01/12/2019, 3:50 PM

## 2019-01-12 NOTE — TOC Progression Note (Addendum)
Transition of Care [facility]) - Progression Note    Patient Details  Name: Kathryn Hahn MRN: [id number] Date of Birth: 01-30-1934  Transition of Care [facility]) CM/SW Contact  Tania Shanera Meske, LCSW Phone Number: 01/12/2019, 9:50 AM  Clinical Narrative:     CSW attempted to contact [facility]. Voicemail left with CSW callback number.   PASSR # - [id number]  11:05am - Attempted to contact [facility] again. Voicemail left for admissions director.    Expected Discharge Plan: [place]    Expected Discharge Plan and Services Expected Discharge Plan: [place]   Discharge Planning Services: CM Consult Post Acute Care Choice: Skilled Nursing Facility(patient would like to go to [facility] at [facility])                                         Social Determinants of Health (SDOH) Interventions    Readmission Risk Interventions No flowsheet data found.

## 2019-01-12 NOTE — NC FL2 (Signed)
[place] LEVEL OF CARE SCREENING TOOL     IDENTIFICATION  Patient Name: Kathryn Hahn Birthdate: 1934/01/03 Sex: female Admission Date (Current Location): 01/10/2019  [place] and [place] Number:  Engineering geologist and Address:  [facility], [address] [place], [place] [address]      Provider Number: [id number]  Attending Physician Name and Address:  Nicholes Mango, MD  Relative Name and Phone Number:  Prigmore,Edward Donnelly Angelica      [phone]    Current Level of Care: Hospital Recommended Level of Care: [place] Prior Approval Number:    Date Approved/Denied:   PASRR Number: [id number]  Discharge Plan: SNF    Current Diagnoses: Patient Active Problem List   Diagnosis Date Noted  . Subarachnoid hemorrhage following injury, no loss of consciousness ([place])   . Multiple closed fractures of ribs of right side   . Closed fracture of right proximal humerus   . Symptomatic anemia   . Thrombocytopenia ([place])   . Chemotherapy-induced neutropenia ([place])   . SAH (subarachnoid hemorrhage) ([place]) 01/10/2019  . Drug-induced neutropenia ([place]) 12/27/2018  . AML (acute myeloid leukemia) with failed remission ([place]) 11/25/2018  . Dehydration 11/25/2018  . Tumor lysis syndrome 11/25/2018  . MDS/MPN (myelodysplastic/myeloproliferative neoplasms) ([place]) 08/28/2018  . Goals of care, counseling/discussion 08/28/2018  . Splenomegaly 02/22/2016  . Essential hypertension 02/22/2015  . History of breast cancer 02/22/2015  . History of kidney stones 02/22/2015  . Hyperlipidemia, mixed 02/22/2015  . Vitamin D deficiency 02/22/2015    Orientation RESPIRATION BLADDER Height & Weight     Self, Time, Situation, Place  Normal Incontinent Weight: 95 lb (43.1 kg) Height:  4\' 10"  (147.3 cm)  BEHAVIORAL SYMPTOMS/MOOD NEUROLOGICAL BOWEL NUTRITION STATUS      Continent Diet(Fluid consistency: Thin)  AMBULATORY STATUS COMMUNICATION  OF NEEDS Skin   Extensive Assist Verbally Normal                       Personal Care Assistance Level of Assistance  Bathing, Feeding, Dressing Bathing Assistance: Maximum assistance Feeding assistance: Limited assistance Dressing Assistance: Maximum assistance     Functional Limitations Info  Sight, Hearing, Speech Sight Info: Adequate Hearing Info: Adequate Speech Info: Adequate    SPECIAL CARE FACTORS FREQUENCY  PT (By licensed PT), OT (By licensed OT)     PT Frequency: 5 times per week OT Frequency: 5 times per week            Contractures Contractures Info: Not present    Additional Factors Info  Code Status, Allergies Code Status Info: FULL Allergies Info: Terbinafine           Current Medications (01/12/2019):  This is the current hospital active medication list Current Facility-Administered Medications  Medication Dose Route Frequency Provider Last Rate Last Dose  . 0.9 %  sodium chloride infusion   Intravenous Continuous Dustin Flock, MD 50 mL/hr at 01/11/19 1604    . acetaminophen (TYLENOL) tablet 650 mg  650 mg Oral Q6H PRN Dustin Flock, MD   650 mg at 01/11/19 1243   Or  . acetaminophen (TYLENOL) suppository 650 mg  650 mg Rectal Q6H PRN Dustin Flock, MD      . acyclovir (ZOVIRAX) 200 MG capsule 400 mg  400 mg Oral Daily Dustin Flock, MD   400 mg at 01/11/19 0926  . atenolol (TENORMIN) tablet 50 mg  50 mg Oral BID Dustin Flock, MD  50 mg at 01/11/19 2212  . calcium carbonate (TUMS - dosed in mg elemental calcium) chewable tablet 600 mg of elemental calcium  600 mg of elemental calcium Oral Q breakfast Dustin Flock, MD      . cholecalciferol (VITAMIN D) tablet 1,000 Units  1,000 Units Oral Daily Dustin Flock, MD   1,000 Units at 01/11/19 0926  . ciprofloxacin (CIPRO) tablet 500 mg  500 mg Oral BID Earlie Server, MD   500 mg at 01/12/19 0848  . heparin lock flush 100 unit/mL  500 Units Intracatheter Daily PRN Earlie Server, MD      .  heparin lock flush 100 unit/mL  250 Units Intracatheter PRN Earlie Server, MD      . HYDROcodone-acetaminophen (NORCO/VICODIN) 5-325 MG per tablet 1-2 tablet  1-2 tablet Oral Q4H PRN Dustin Flock, MD   1 tablet at 01/11/19 0106  . levETIRAcetam (KEPPRA) tablet 250 mg  250 mg Oral q morning - 10a Dustin Flock, MD   250 mg at 01/11/19 0926  . ondansetron (ZOFRAN) tablet 4 mg  4 mg Oral Q6H PRN Dustin Flock, MD       Or  . ondansetron [facility]) injection 4 mg  4 mg Intravenous Q6H PRN Dustin Flock, MD   4 mg at 01/12/19 0104  . sodium chloride flush (NS) 0.9 % injection 10 mL  10 mL Intracatheter PRN Earlie Server, MD      . sodium chloride flush (NS) 0.9 % injection 3 mL  3 mL Intracatheter PRN Earlie Server, MD      . vitamin C (ASCORBIC ACID) tablet 500 mg  500 mg Oral BID Dustin Flock, MD   500 mg at 01/11/19 2217     Discharge Medications: Please see discharge summary for a list of discharge medications.  Relevant Imaging Results:  Relevant Lab Results:   Additional Information SSN:   [id number]  Tania Arrabella Westerman, LCSW

## 2019-01-12 NOTE — Progress Notes (Signed)
Hematology/Oncology Progress Note [facility] [phone] [phone]  Patient Care Team: Rusty Aus, MD as PCP - General (Internal Medicine)   Name of the patient: Kathryn Hahn  [id number]  1934-05-22  Date of visit: 01/12/19   INTERVAL HISTORY-  Patient is lying in bed, comfortably.  Still feels quite weak and fatigued.   Denies any headache.   Review of systems- Review of Systems  Constitutional: Positive for fatigue.  Respiratory: Negative for cough and shortness of breath.   Cardiovascular: Negative for chest pain.  Gastrointestinal: Negative for abdominal distention and abdominal pain.  Neurological: Negative for headaches.  Psychiatric/Behavioral: Negative for confusion.    Allergies  Allergen Reactions   Terbinafine Rash and Swelling    Patient Active Problem List   Diagnosis Date Noted   Subarachnoid hemorrhage following injury, no loss of consciousness ([place])    Multiple closed fractures of ribs of right side    Closed fracture of right proximal humerus    Symptomatic anemia    Thrombocytopenia (HCC)    Chemotherapy-induced neutropenia (HCC)    SAH (subarachnoid hemorrhage) ([place]) 01/10/2019   Drug-induced neutropenia ([place]) 12/27/2018   AML (acute myeloid leukemia) with failed remission ([place]) 11/25/2018   Dehydration 11/25/2018   Tumor lysis syndrome 11/25/2018   MDS/MPN (myelodysplastic/myeloproliferative neoplasms) ([place]) 08/28/2018   Goals of care, counseling/discussion 08/28/2018   Splenomegaly 02/22/2016   Essential hypertension 02/22/2015   History of breast cancer 02/22/2015   History of kidney stones 02/22/2015   Hyperlipidemia, mixed 02/22/2015   Vitamin D deficiency 02/22/2015     Past Medical History:  Diagnosis Date   Anemia    Arthritis    Breast cancer ([place]) 2003   RT LUMPECTOMY   Edema leg    History of breast cancer 2003   post lumpectomy   History of  kidney stones    Hyperlipemia, mixed    Hypertension, essential    Leukemia ([place])    Leukocytosis    MDS (myelodysplastic syndrome) ([place])    Personal history of radiation therapy 2003   BREAST CA   Renal stones    Vitamin D deficiency      Past Surgical History:  Procedure Laterality Date   BREAST BIOPSY Right 2003   Postive for cancer   BREAST LUMPECTOMY Right 2003   BREAST CA   KIDNEY STONE SURGERY Right     Social History   Socioeconomic History   Marital status: Married    Spouse name: Not on file   Number of children: Not on file   Years of education: Not on file   Highest education level: Not on file  Occupational History   Not on file  Social Needs   Financial resource strain: Not on file   Food insecurity    Worry: Not on file    Inability: Not on file   Transportation needs    Medical: Not on file    Non-medical: Not on file  Tobacco Use   Smoking status: Never Smoker   Smokeless tobacco: Never Used  Substance and Sexual Activity   Alcohol use: Yes    Alcohol/week: 3.0 standard drinks    Types: 3 Glasses of wine per week   Drug use: No   Sexual activity: Not on file  Lifestyle   Physical activity    Days per week: Not on file    Minutes per session: Not on file   Stress: Not on file  Relationships   Social connections  Talks on phone: Not on file    Gets together: Not on file    Attends religious service: Not on file    Active member of club or organization: Not on file    Attends meetings of clubs or organizations: Not on file    Relationship status: Not on file   Intimate partner violence    Fear of current or ex partner: Not on file    Emotionally abused: Not on file    Physically abused: Not on file    Forced sexual activity: Not on file  Other Topics Concern   Not on file  Social History Narrative    No smoking/alcohol; lives in [place] with family.  She lives in assisted living with her husband.      Family History  Problem Relation Age of Onset   Stroke Mother    Hypertension Father    Stroke Father    Breast cancer Neg Hx      Current Facility-Administered Medications:    0.9 %  sodium chloride infusion, , Intravenous, Continuous, Dustin Flock, MD, Last Rate: 50 mL/hr at 01/11/19 1604   acetaminophen (TYLENOL) tablet 650 mg, 650 mg, Oral, Q6H PRN, 650 mg at 01/11/19 1243 **OR** acetaminophen (TYLENOL) suppository 650 mg, 650 mg, Rectal, Q6H PRN, Dustin Flock, MD   acyclovir (ZOVIRAX) 200 MG capsule 400 mg, 400 mg, Oral, Daily, Dustin Flock, MD, 400 mg at 01/12/19 1102   atenolol (TENORMIN) tablet 50 mg, 50 mg, Oral, BID, Dustin Flock, MD, 50 mg at 01/12/19 1102   calcium carbonate (TUMS - dosed in mg elemental calcium) chewable tablet 600 mg of elemental calcium, 600 mg of elemental calcium, Oral, Q breakfast, Dustin Flock, MD   cholecalciferol (VITAMIN D) tablet 1,000 Units, 1,000 Units, Oral, Daily, Dustin Flock, MD, 1,000 Units at 01/11/19 2094   ciprofloxacin (CIPRO) tablet 500 mg, 500 mg, Oral, BID, Earlie Server, MD, 500 mg at 01/12/19 0848   heparin lock flush 100 unit/mL, 500 Units, Intracatheter, Daily PRN, Earlie Server, MD   heparin lock flush 100 unit/mL, 250 Units, Intracatheter, PRN, Earlie Server, MD   HYDROcodone-acetaminophen (NORCO/VICODIN) 5-325 MG per tablet 1-2 tablet, 1-2 tablet, Oral, Q4H PRN, Dustin Flock, MD, 1 tablet at 01/11/19 0106   levETIRAcetam (KEPPRA) tablet 250 mg, 250 mg, Oral, q morning - 10a, Dustin Flock, MD, 250 mg at 01/12/19 1102   ondansetron (ZOFRAN) tablet 4 mg, 4 mg, Oral, Q6H PRN **OR** ondansetron (ZOFRAN) injection 4 mg, 4 mg, Intravenous, Q6H PRN, Dustin Flock, MD, 4 mg at 01/12/19 0104   sodium chloride flush (NS) 0.9 % injection 10 mL, 10 mL, Intracatheter, PRN, Earlie Server, MD   sodium chloride flush (NS) 0.9 % injection 3 mL, 3 mL, Intracatheter, PRN, Earlie Server, MD   vitamin C (ASCORBIC ACID) tablet  500 mg, 500 mg, Oral, BID, Dustin Flock, MD, 500 mg at 01/11/19 2217   Physical exam:  Vitals:   01/12/19 0628 01/12/19 0658 01/12/19 0851 01/12/19 1005  BP: 113/67 119/68 (!) 149/76 124/71  Pulse: 61 82 70 85  Resp: _0 Temp: 97.7 F (36.5 C) 97.9 F (36.6 C) (!) 97.5 F (36.4 C) 98.2 F (36.8 C)  TempSrc: Oral Oral Oral Oral  SpO2: 100% 100% 99% 100%  Weight:      Height:       Physical Exam  Constitutional: She is oriented to person, place, and time. No distress.  Frail appearance  Eyes:  Right subconjunctival hemorrhage  Neck:  Neck supple.  Cardiovascular: Normal rate.  No murmur heard. Pulmonary/Chest: Effort normal.  Abdominal: Soft. Bowel sounds are normal. She exhibits no distension.  Musculoskeletal: Normal range of motion.     Comments: Right arm in immobilizer  Neurological: She is alert and oriented to person, place, and time.  Skin: Skin is warm.  Psychiatric: Affect normal.       CMP Latest Ref Rng & Units 01/10/2019  Glucose 70 - 99 mg/dL 137(H)  BUN 8 - 23 mg/dL 19  Creatinine 0.44 - 1.00 mg/dL 0.54  Sodium 135 - 145 mmol/L 137  Potassium 3.5 - 5.1 mmol/L 3.5  Chloride 98 - 111 mmol/L 103  CO2 22 - 32 mmol/L 25  Calcium 8.9 - 10.3 mg/dL 8.7(L)  Total Protein 6.5 - 8.1 g/dL 6.5  Total Bilirubin 0.3 - 1.2 mg/dL 0.8  Alkaline Phos 38 - 126 U/L 89  AST 15 - 41 U/L 29  ALT 0 - 44 U/L 16   CBC Latest Ref Rng & Units 01/12/2019  WBC 4.0 - 10.5 K/uL -  Hemoglobin 12.0 - 15.0 g/dL 8.5(L)  Hematocrit 36.0 - 46.0 % -  Platelets 150 - 400 K/uL -    RADIOGRAPHIC STUDIES: I have personally reviewed the radiological images as listed and agreed with the findings in the report.  Dg Chest 2 View  Result Date: 01/10/2019 CLINICAL DATA:  RIGHT chest pain following fall.  Initial encounter. EXAM: CHEST - 2 VIEW COMPARISON:  None. FINDINGS: Cardiomegaly noted. A LEFT PICC line with tip overlying the LOWER SVC noted. Mild interstitial opacities  bilaterally are identified. Trace pleural effusions are noted. No focal airspace disease or pneumothorax. Fractures of the RIGHT 5th through 8th ribs noted. Mildly impacted RIGHT humeral neck fracture extending into the head is identified. RIGHT breast/axillary surgical clips noted. IMPRESSION: 1. RIGHT 5th through 8th rib fractures. No evidence of pneumothorax. 2. RIGHT humeral neck/head fracture. 3. Cardiomegaly with mild interstitial opacities of uncertain chronicity. With trace bilateral pleural effusions, these interstitial opacities may represent mild interstitial edema. Electronically Signed   By: Margarette Canada M.D.   On: 01/10/2019 08:43   Dg Shoulder Right  Result Date: 01/10/2019 CLINICAL DATA:  Acute RIGHT shoulder pain following fall. Initial encounter. EXAM: RIGHT SHOULDER - 2+ VIEW COMPARISON:  None. FINDINGS: A mildly impacted humeral neck fracture is noted extending into the head and involving the greater tuberosity. No shoulder dislocation. Fractures of the RIGHT 5th through 8th ribs noted.  No pneumothorax. IMPRESSION: 1. Mildly impacted humeral neck fracture extending into the head and greater tuberosity. 2. RIGHT 5th through 8th rib fractures.  No pneumothorax. Electronically Signed   By: Margarette Canada M.D.   On: 01/10/2019 08:46   Ct Head Wo Contrast  Result Date: 01/10/2019 CLINICAL DATA:  Per neurosurgery repeat CT scan of the head in 6 hours. Pt ems from [facility] s/p fall mechanical pt c/o of rt shoulder pain. EXAM: CT HEAD WITHOUT CONTRAST TECHNIQUE: Contiguous axial images were obtained from the base of the skull through the vertex without intravenous contrast. COMPARISON:  01/10/2019 at 8:30 a.m. FINDINGS: Brain: No change in the small focus of hemorrhage noted along the anterior right frontal lobe. No new intracranial hemorrhage. No hydrocephalus or evidence of an ischemic infarct. Atrophy and mild chronic microvascular ischemic change is stable from the earlier exam.  Vascular: No hyperdense vessel or unexpected calcification. Skull: Normal. Negative for fracture or focal lesion. Sinuses/Orbits: No acute finding. Other: None. IMPRESSION: 1. No change  from the study obtained earlier today. 2. Trace posttraumatic hemorrhage along the anterior right frontal lobe. No new hemorrhage. Electronically Signed   By: Lajean Manes M.D.   On: 01/10/2019 15:30   Ct Head Wo Contrast  Addendum Date: 01/10/2019   ADDENDUM REPORT: 01/10/2019 09:31 ADDENDUM: Study discussed by telephone with Dr. Conni Slipper on 01/10/2019 at 0921 hours. Electronically Signed   By: Genevie Ann M.D.   On: 01/10/2019 09:31   Result Date: 01/10/2019 CLINICAL DATA:  85 year old female status post fall this morning. Right orbits/frontal injury. Neck pain. EXAM: CT HEAD WITHOUT CONTRAST CT CERVICAL SPINE WITHOUT CONTRAST TECHNIQUE: Multidetector CT imaging of the head and cervical spine was performed following the standard protocol without intravenous contrast. Multiplanar CT image reconstructions of the cervical spine were also generated. COMPARISON:  CTA head 01/10/2017. CT face 01/09/2017. FINDINGS: CT HEAD FINDINGS Brain: Stable cerebral volume, normal for age. No midline shift, mass effect, or evidence of intracranial mass lesion. No ventriculomegaly. No intraventricular hemorrhage identified. Trace acute hemorrhage along the right anterior frontal convexity on series 2, image 13 appears to be subarachnoid rather than a hemorrhagic cerebral contusion. No other acute intracranial hemorrhage identified. Stable gray-white matter differentiation throughout the brain. No cortically based acute infarct identified. No cortical encephalomalacia. Normal basilar cisterns. Vascular: Calcified atherosclerosis at the skull base. No suspicious intracranial vascular hyperdensity. Skull: Intact. Sinuses/Orbits: Trace to mild paranasal sinus mucosal thickening is new since 2018. Tympanic cavities and mastoids remain clear. Other:  Right posterior convexity scalp hematoma measuring up to 4 millimeters in thickness. Underlying calvarium intact. Stable and negative other orbit and scalp soft tissues. CT CERVICAL SPINE FINDINGS Alignment: Reversal of upper cervical lordosis is chronic and appears stable since 2018. Bilateral posterior element alignment is within normal limits. Cervicothoracic junction alignment is within normal limits. Skull base and vertebrae: Visualized skull base is intact. No atlanto-occipital dissociation. Congenital incomplete ossification of the posterior C1 ring. No acute osseous abnormality identified. Soft tissues and spinal canal: No prevertebral fluid or swelling. No visible canal hematoma. Disc levels: Widespread advanced cervical spine disc and endplate degeneration. Progressed left facet degeneration at C2-C3 since 2018. Mild if any associated cervical spinal stenosis. Upper chest: Grossly intact visible upper thoracic levels. Negative lung apices. IMPRESSION: 1. Positive for trace posttraumatic hemorrhage along the right anterior frontal lobe, favor trace subarachnoid over small hemorrhagic contusion. 2. No intracranial mass effect and no other acute traumatic injury to the brain identified. 3. Right posterior convexity scalp hematoma without underlying fracture. 4. No acute traumatic injury identified in the cervical spine. Electronically Signed: By: Genevie Ann M.D. On: 01/10/2019 09:09   Ct Cervical Spine Wo Contrast  Addendum Date: 01/10/2019   ADDENDUM REPORT: 01/10/2019 09:31 ADDENDUM: Study discussed by telephone with Dr. Conni Slipper on 01/10/2019 at 0921 hours. Electronically Signed   By: Genevie Ann M.D.   On: 01/10/2019 09:31   Result Date: 01/10/2019 CLINICAL DATA:  85 year old female status post fall this morning. Right orbits/frontal injury. Neck pain. EXAM: CT HEAD WITHOUT CONTRAST CT CERVICAL SPINE WITHOUT CONTRAST TECHNIQUE: Multidetector CT imaging of the head and cervical spine was performed  following the standard protocol without intravenous contrast. Multiplanar CT image reconstructions of the cervical spine were also generated. COMPARISON:  CTA head 01/10/2017. CT face 01/09/2017. FINDINGS: CT HEAD FINDINGS Brain: Stable cerebral volume, normal for age. No midline shift, mass effect, or evidence of intracranial mass lesion. No ventriculomegaly. No intraventricular hemorrhage identified. Trace acute hemorrhage along the right anterior  frontal convexity on series 2, image 13 appears to be subarachnoid rather than a hemorrhagic cerebral contusion. No other acute intracranial hemorrhage identified. Stable gray-white matter differentiation throughout the brain. No cortically based acute infarct identified. No cortical encephalomalacia. Normal basilar cisterns. Vascular: Calcified atherosclerosis at the skull base. No suspicious intracranial vascular hyperdensity. Skull: Intact. Sinuses/Orbits: Trace to mild paranasal sinus mucosal thickening is new since 2018. Tympanic cavities and mastoids remain clear. Other: Right posterior convexity scalp hematoma measuring up to 4 millimeters in thickness. Underlying calvarium intact. Stable and negative other orbit and scalp soft tissues. CT CERVICAL SPINE FINDINGS Alignment: Reversal of upper cervical lordosis is chronic and appears stable since 2018. Bilateral posterior element alignment is within normal limits. Cervicothoracic junction alignment is within normal limits. Skull base and vertebrae: Visualized skull base is intact. No atlanto-occipital dissociation. Congenital incomplete ossification of the posterior C1 ring. No acute osseous abnormality identified. Soft tissues and spinal canal: No prevertebral fluid or swelling. No visible canal hematoma. Disc levels: Widespread advanced cervical spine disc and endplate degeneration. Progressed left facet degeneration at C2-C3 since 2018. Mild if any associated cervical spinal stenosis. Upper chest: Grossly intact  visible upper thoracic levels. Negative lung apices. IMPRESSION: 1. Positive for trace posttraumatic hemorrhage along the right anterior frontal lobe, favor trace subarachnoid over small hemorrhagic contusion. 2. No intracranial mass effect and no other acute traumatic injury to the brain identified. 3. Right posterior convexity scalp hematoma without underlying fracture. 4. No acute traumatic injury identified in the cervical spine. Electronically Signed: By: Genevie Ann M.D. On: 01/10/2019 09:09   Ct Shoulder Right Wo Contrast  Result Date: 01/10/2019 CLINICAL DATA:  Right shoulder pain after a fall today. Initial encounter. EXAM: CT OF THE UPPER RIGHT EXTREMITY WITHOUT CONTRAST TECHNIQUE: Multidetector CT imaging of the upper right extremity was performed according to the standard protocol. COMPARISON:  Plain films right shoulder earlier today. FINDINGS: Bones/Joint/Cartilage As seen on the comparison study, the patient has a transverse fracture through the metaphysis of the humerus. The articular surface of the humeral head is rotated medially due to retraction by the rotator cuff. There is mild anterior displacement of the fracture. Nondisplaced component extends into the anterior aspect of the greater tuberosity. The lesser tuberosity is spared. The humeral head is located and the acromioclavicular joint is intact. Also seen are acute fractures of the posterior arcs of the right fifth, sixth, seventh and eighth ribs. No other fracture is identified. Ligaments Suboptimally assessed by CT. Muscles and Tendons Visualization of the rotator cuff is somewhat limited on CT but no fracture seen. Soft tissues There is some hemorrhage in the subacromial/subdeltoid bursa due to the patient's fracture. Small right pleural effusion is noted. No pneumothorax is identified but the anterior aspect of the right lung is seen only near the apex. Imaged lung parenchyma is clear with the exception of mild dependent atelectasis.  IMPRESSION: Transverse fracture through the metaphysis of the humerus with mild impaction includes a nondisplaced component in the anterior aspect of the greater tuberosity. The humeral head is rotated medially due to retraction by the rotator cuff. Acute right fifth through eighth rib fractures with an associated small pleural effusion. No pneumothorax is seen but the anterior margin of the lung is only seen at the apex. Hemorrhage in the subacromial/subdeltoid bursa due to the patient's fracture. Electronically Signed   By: Inge Rise M.D.   On: 01/10/2019 15:34   Ct Bone Marrow Biopsy & Aspiration  Result Date: 12/24/2018 INDICATION:  Acute myeloid leukemia with failed remission EXAM: CT-GUIDED BONE MARROW ASPIRATION AND BIOPSY MEDICATIONS: None. ANESTHESIA/SEDATION: Moderate (conscious) sedation was employed during this procedure. A total of Versed 1 mg and Fentanyl 25 mcg was administered intravenously. Moderate Sedation Time: 16 minutes. The patient's level of consciousness and vital signs were monitored continuously by radiology nursing throughout the procedure under my direct supervision. FLUOROSCOPY TIME:  Not applicable COMPLICATIONS: None immediate. PROCEDURE: Informed written consent was obtained from the patient after a thorough discussion of the procedural risks, benefits and alternatives. All questions were addressed. Maximal Sterile Barrier Technique was utilized including caps, mask, sterile gowns, sterile gloves, sterile drape, hand hygiene and skin antiseptic. A timeout was performed prior to the initiation of the procedure. Utilizing 0.25% Marcaine as a local and deep periosteal anesthetic and CT guidance, an Arrow OnControl bone biopsy needle was placed percutaneously into the left iliac bone. Initial aspiration samples were obtained. Subsequently 2 core biopsy passes were obtained and sent for pathologic evaluation. These were deemed adequate. The needle was removed. Hemostasis was  obtained at the puncture site. Patient tolerated the procedure well and was returned to her room in satisfactory condition. IMPRESSION: Successful CT-guided bone marrow biopsy and aspiration as described. Electronically Signed   By: Inez Catalina M.D.   On: 12/24/2018 09:41   Mm 3d Screen Breast Bilateral  Result Date: 12/20/2018 CLINICAL DATA:  Screening. EXAM: DIGITAL SCREENING BILATERAL MAMMOGRAM WITH TOMO AND CAD COMPARISON:  Previous exam(s). ACR Breast Density Category b: There are scattered areas of fibroglandular density. FINDINGS: There are no findings suspicious for malignancy. Images were processed with CAD. IMPRESSION: No mammographic evidence of malignancy. A result letter of this screening mammogram will be mailed directly to the patient. RECOMMENDATION: Screening mammogram in one year. (Code:SM-B-01Y) BI-RADS CATEGORY  1: Negative. Electronically Signed   By: Curlene Dolphin M.D.   On: 12/20/2018 11:41    Assessment and plan-  Patient is a 85 y.o. female who has acute leukemia on chemotherapy with Venetoclax and Vidaza,  Pancytopenia present to emergency room for evaluation after fall. CT findings of subarachnoid hemorrhage and also right humeral fracture, and rib fracture.  #Thrombocytopenia,  status post 1 unit of platelet yesterday.  Platelet trended up to 35,000. Neuro status stable.  Continue close monitor with daily CBC. #Symptomatic anemia, status post 1 unit of PRBC transfusion yesterday.  Hemoglobin improved to 8.5.  Continue monitor.  #Subarachnoid hemorrhage, neuro check.  Neurology on board.  Continue supportive care.Marland Kitchen  #Right humeral fracture and rib fracture, was seen by orthopedic surgeon Dr. Marry Guan.  Continue immobilization and conservative measures.  #, AML, on chemotherapy with Vidaza and venetoclax. hold Venetoclax due to low blood counts.   Follow-up with Dr. Rogue Bussing.  # Neutropenia, due to AML and chemotherapy. Hold G-CSF.  Started on prophylactic  antibiotics with Cipro 518m BID. Monitor with daily CBC.  Ordered. Plan was discussed with Dr. GMargaretmary EddyThank you for allowing me to participate in the care of this patient.   ZEarlie Server MD, PhD Hematology Oncology CBay State Wing [facility] And Medical Centersat ABoston Eye Surgery And Laser CenterPager- [phone]/07/2018

## 2019-01-13 ENCOUNTER — Inpatient Hospital Stay: Payer: Medicare Other

## 2019-01-13 ENCOUNTER — Other Ambulatory Visit: Payer: Self-pay | Admitting: Internal Medicine

## 2019-01-13 ENCOUNTER — Inpatient Hospital Stay: Payer: Medicare Other | Admitting: Internal Medicine

## 2019-01-13 ENCOUNTER — Telehealth: Payer: Self-pay | Admitting: Internal Medicine

## 2019-01-13 DIAGNOSIS — W19XXXA Unspecified fall, initial encounter: Secondary | ICD-10-CM

## 2019-01-13 DIAGNOSIS — I619 Nontraumatic intracerebral hemorrhage, unspecified: Secondary | ICD-10-CM

## 2019-01-13 DIAGNOSIS — T148XXA Other injury of unspecified body region, initial encounter: Secondary | ICD-10-CM

## 2019-01-13 DIAGNOSIS — R0902 Hypoxemia: Secondary | ICD-10-CM

## 2019-01-13 DIAGNOSIS — D61818 Other pancytopenia: Secondary | ICD-10-CM

## 2019-01-13 LAB — CBC WITH DIFFERENTIAL/PLATELET
Abs Immature Granulocytes: 0 10*3/uL (ref 0.00–0.07)
Basophils Absolute: 0 10*3/uL (ref 0.0–0.1)
Basophils Relative: 2 %
Eosinophils Absolute: 0 10*3/uL (ref 0.0–0.5)
Eosinophils Relative: 0 %
HCT: 25.3 % — ABNORMAL LOW (ref 36.0–46.0)
Hemoglobin: 8.1 g/dL — ABNORMAL LOW (ref 12.0–15.0)
Lymphocytes Relative: 68 %
Lymphs Abs: 0.9 10*3/uL (ref 0.7–4.0)
MCH: 24.9 pg — ABNORMAL LOW (ref 26.0–34.0)
MCHC: 32 g/dL (ref 30.0–36.0)
MCV: 77.8 fL — ABNORMAL LOW (ref 80.0–100.0)
Monocytes Absolute: 0.2 10*3/uL (ref 0.1–1.0)
Monocytes Relative: 14 %
Neutro Abs: 0.2 10*3/uL — ABNORMAL LOW (ref 1.7–7.7)
Neutrophils Relative %: 16 %
Platelets: 30 10*3/uL — ABNORMAL LOW (ref 150–400)
RBC: 3.25 MIL/uL — ABNORMAL LOW (ref 3.87–5.11)
RDW: 20 % — ABNORMAL HIGH (ref 11.5–15.5)
WBC Morphology: ABNORMAL
WBC: 1.3 10*3/uL — CL (ref 4.0–10.5)
nRBC: 6.3 % — ABNORMAL HIGH (ref 0.0–0.2)

## 2019-01-13 LAB — BPAM PLATELET PHERESIS
Blood Product Expiration Date: 202008032359
ISSUE DATE / TIME: 202008020130
Unit Type and Rh: 6200

## 2019-01-13 LAB — BPAM RBC
Blood Product Expiration Date: 202008112359
ISSUE DATE / TIME: 202008020640
Unit Type and Rh: 5100

## 2019-01-13 LAB — TYPE AND SCREEN
ABO/RH(D): O POS
Antibody Screen: NEGATIVE
Unit division: 0

## 2019-01-13 LAB — PREPARE PLATELET PHERESIS: Unit division: 0

## 2019-01-13 LAB — PATHOLOGIST SMEAR REVIEW

## 2019-01-13 LAB — BRAIN NATRIURETIC PEPTIDE: B Natriuretic Peptide: 238 pg/mL — ABNORMAL HIGH (ref 0.0–100.0)

## 2019-01-13 MED ORDER — FUROSEMIDE 10 MG/ML IJ SOLN
20.0000 mg | Freq: Once | INTRAMUSCULAR | Status: AC
  Administered 2019-01-13: 20 mg via INTRAVENOUS
  Filled 2019-01-13: qty 4

## 2019-01-13 MED ORDER — POLYETHYLENE GLYCOL 3350 17 G PO PACK
17.0000 g | PACK | Freq: Every day | ORAL | Status: DC
  Administered 2019-01-14 (×2): 17 g via ORAL
  Filled 2019-01-13 (×2): qty 1

## 2019-01-13 MED ORDER — LOSARTAN POTASSIUM-HCTZ 50-12.5 MG PO TABS: 1.0000 | ORAL_TABLET | Freq: Every day | ORAL | Status: DC

## 2019-01-13 MED ORDER — TBO-FILGRASTIM 300 MCG/0.5ML ~~LOC~~ SOSY
300.0000 ug | PREFILLED_SYRINGE | Freq: Every day | SUBCUTANEOUS | Status: DC
  Administered 2019-01-13 – 2019-01-14 (×2): 300 ug via SUBCUTANEOUS
  Filled 2019-01-13 (×2): qty 0.5

## 2019-01-13 MED ORDER — HYDROCHLOROTHIAZIDE 12.5 MG PO CAPS
12.5000 mg | ORAL_CAPSULE | Freq: Every day | ORAL | Status: DC
  Administered 2019-01-13: 12.5 mg via ORAL
  Filled 2019-01-13 (×2): qty 1

## 2019-01-13 MED ORDER — LOSARTAN POTASSIUM 50 MG PO TABS
50.0000 mg | ORAL_TABLET | Freq: Every day | ORAL | Status: DC
  Administered 2019-01-13: 50 mg via ORAL
  Filled 2019-01-13: qty 1

## 2019-01-13 NOTE — Progress Notes (Signed)
Kathryn Hahn   DOB:03/25/1934   FY#:[id number]    Subjective: No fevers no chills.  Complains of pain in the right ribs/ Fracture.  No shortness of breath or cough.  No bleeding noted.  Denies any headaches.  Objective:  Vitals:   01/12/19 2320 01/13/19 0726  BP: (!) 146/82 (!) 143/82  Pulse: 90 81  Resp: 18 18  Temp:  98 F (36.7 C)  SpO2: 96% 99%     Intake/Output Summary (Last 24 hours) at 01/13/2019 1352 Last data filed at 01/13/2019 0940 Gross per 24 hour  Intake 480 ml  Output -  Net 480 ml    Physical Exam  Constitutional: She is oriented to person, place, and time and well-developed, well-nourished, and in no distress.  HENT:  Head: Normocephalic and atraumatic.  Mouth/Throat: Oropharynx is clear and moist. No oropharyngeal exudate.  Eyes: Pupils are equal, round, and reactive to light.  Neck: Normal range of motion. Neck supple.  Cardiovascular: Normal rate and regular rhythm.  Pulmonary/Chest: No respiratory distress. She has no wheezes.  Decreased air entry; no crackles noted.  Abdominal: Soft. Bowel sounds are normal. She exhibits no distension and no mass. There is no abdominal tenderness. There is no rebound and no guarding.  Musculoskeletal: Normal range of motion.        General: No tenderness or edema.  Neurological: She is alert and oriented to person, place, and time.  Skin: Skin is warm.  Multiple bruises noted.  No active bleeding noted.  Psychiatric: Affect normal.     Labs:  Lab Results  Component Value Date   WBC 1.3 (LL) 01/13/2019   HGB 8.1 (L) 01/13/2019   HCT 25.3 (L) 01/13/2019   MCV 77.8 (L) 01/13/2019   PLT 30 (L) 01/13/2019   NEUTROABS 0.2 (L) 01/13/2019    Lab Results  Component Value Date   NA 137 01/10/2019   K 3.5 01/10/2019   CL 103 01/10/2019   CO2 25 01/10/2019    Studies:  Dg Chest 1 View  Result Date: 01/13/2019 CLINICAL DATA:  Recent history of fall. Hypoxia. History of breast cancer. History of leukemia. EXAM:  CHEST  1 VIEW COMPARISON:  01/10/2019. FINDINGS: PICC line stable position. Mediastinum and hilar structures are stable. Stable cardiomegaly. Dense bibasilar atelectasis/infiltrate noted on today's exam. Small bilateral pleural effusions. Deformity noted of the right chest with multiple right rib fractures again noted. Surgical clips right chest. IMPRESSION: 1.  PICC line in stable position. 2.  Stable cardiomegaly. 3. Dense bibasilar atelectasis/infiltrates noted on today's exam. Small bilateral pleural effusions noted. 4. Deformity noted of the right chest with multiple right rib fractures again noted. No pneumothorax. Electronically Signed   By: Marcello Moores  Register   On: 01/13/2019 12:36    AML (acute myeloid leukemia) with failed remission [facility]) #85 year old female patient with acute myeloid leukemia is currently admitted to hospital for mechanical fall/subarachnoid hemorrhage/humeral rib fractures.  #Acute myeloid leukemia with monocytic differentiation-status post Vidaza venetoclax cycle #1; currently off Venatoclax [since 7/17] sec to cytopenias.  Bone marrow biopsy day #28-negative for blasts.  Continue to hold Vidaza/venetoclax.   #Pancytopenia -MDS/venetoclax.  Hemoglobin 8.1 platelets 30; absolute neutrophil count 300-recommend Granix.  Continue prophylactic antibiotics.  #Hypoxia-on room air 85% on 2 L -94.  Question atelectasis/splinting.  Defer to primary team for further work-up  #Status post fall-humeral fracture refracture-conservative measures.  Follow-up with orthopedics.   #Intracranial bleed-status post fall.  Clinically stable.  Platelets 30s.  Monitor closely.  #Will discuss  with patient's family.  Discussed with Dr. Brett Albino.    Cammie Sickle, MD 01/13/2019  1:52 PM

## 2019-01-13 NOTE — Care Management Important Message (Signed)
Important Message  Patient Details  Name: Kathryn Hahn MRN: [id number] Date of Birth: 04/04/1934   Medicare Important Message Given:  Yes     [name] 01/13/2019, 11:27 AM

## 2019-01-13 NOTE — Progress Notes (Signed)
[facility] [place] NAME: Kathryn Hahn    MR#:  [id number]  DATE OF BIRTH:  Sep 07, 1933  SUBJECTIVE:   Patient states she is feeling a little bit better today.  She received blood transfusion yesterday without any issues.  She states she feels a little bit short of breath when she walks around.  No shortness of breath at rest.  She denies any cough, fevers, chills.  REVIEW OF SYSTEMS:  CONSTITUTIONAL: No fever, fatigue or weakness.  EYES: No blurred or double vision.  EARS, NOSE, AND THROAT: No tinnitus or ear pain.  RESPIRATORY: No cough, wheezing or hemoptysis. +SOB CARDIOVASCULAR: No orthopnea, edema. +rib pain GASTROINTESTINAL: No nausea, vomiting, diarrhea or abdominal pain.  GENITOURINARY: No dysuria, hematuria.  ENDOCRINE: No polyuria, nocturia,  HEMATOLOGY: No anemia, easy bruising or bleeding SKIN: No rash or lesion. MUSCULOSKELETAL: No joint pain or arthritis.   NEUROLOGIC: No tingling, numbness, weakness.  PSYCHIATRY: No anxiety or depression.   DRUG ALLERGIES:   Allergies  Allergen Reactions  . Terbinafine Rash and Swelling    VITALS:  Blood pressure (!) 145/80, pulse 90, temperature 98.3 F (36.8 C), temperature source Oral, resp. rate 18, height 4\' 10"  (1.473 m), weight 43.1 kg, SpO2 100 %.  PHYSICAL EXAMINATION:  GENERAL:  85 y.o.-year-old patient lying in the bed with no acute distress.  EYES: Pupils equal, round, reactive to light and accommodation. No scleral icterus. Extraocular muscles intact.  Positive right conjunctival hemorrhage HEENT: Head atraumatic, normocephalic. Oropharynx and nasopharynx clear.  NECK:  Supple, no jugular venous distention. No thyroid enlargement, no tenderness.  LUNGS: + Diminished breath sounds in the lung bases bilaterally, no wheezing, rales,rhonchi or crepitation. No use of accessory muscles of respiration. + Taking shallow breaths. + Nasal cannula in  place CARDIOVASCULAR: RRR, S1, S2 normal. No murmurs, rubs, or gallops.  ABDOMEN: Soft, nontender, nondistended. Bowel sounds present. EXTREMITIES: No pedal edema, cyanosis, or clubbing.  NEUROLOGIC: Cranial nerves II through XII are intact. Muscle strength at her baseline in all extremities. Sensation intact. Gait not checked.  PSYCHIATRIC: The patient is alert and oriented x 3.  SKIN: No obvious rash, lesion, or ulcer.    LABORATORY PANEL:   CBC Recent Labs  Lab 01/13/19 0447  WBC 1.3*  HGB 8.1*  HCT 25.3*  PLT 30*   ------------------------------------------------------------------------------------------------------------------  Chemistries  Recent Labs  Lab 01/10/19 0808  NA 137  K 3.5  CL 103  CO2 25  GLUCOSE 137*  BUN 19  CREATININE 0.54  CALCIUM 8.7*  AST 29  ALT 16  ALKPHOS 89  BILITOT 0.8   ------------------------------------------------------------------------------------------------------------------  Cardiac Enzymes No results for input(s): TROPONINI in the last 168 hours. ------------------------------------------------------------------------------------------------------------------  RADIOLOGY:  Dg Chest 1 View  Result Date: 01/13/2019 CLINICAL DATA:  Recent history of fall. Hypoxia. History of breast cancer. History of leukemia. EXAM: CHEST  1 VIEW COMPARISON:  01/10/2019. FINDINGS: PICC line stable position. Mediastinum and hilar structures are stable. Stable cardiomegaly. Dense bibasilar atelectasis/infiltrate noted on today's exam. Small bilateral pleural effusions. Deformity noted of the right chest with multiple right rib fractures again noted. Surgical clips right chest. IMPRESSION: 1.  PICC line in stable position. 2.  Stable cardiomegaly. 3. Dense bibasilar atelectasis/infiltrates noted on today's exam. Small bilateral pleural effusions noted. 4. Deformity noted of the right chest with multiple right rib fractures again noted. No pneumothorax.  Electronically Signed   By: Marcello Moores  Register   On: 01/13/2019 12:36  EKG:   Orders placed or performed in visit on 01/10/19  . EKG 12-Lead  . EKG 12-Lead  . EKG 12-Lead    ASSESSMENT AND PLAN:   Acute hypoxic respiratory failure-O2 sats were in the mid 80s this morning.  May be due to volume overload after receiving blood yesterday versus splinting in the setting of her rib fractures. -Chest x-ray ordered this morning and showed dense bibasilar atelectasis/infiltrates with small bilateral pleural effusions -Check procalcitonin -BNP mildly elevated, so will give a one-time dose of Lasix to see if this helps -Incentive spirometry ordered -Recheck chest x-ray in the morning -Check ECHO  Small subarachnoid hemorrhage- stable, no new mental status changes -Neurosurgery recommended Keppra to 50 mg daily for 7 days for seizure prophylaxis  Acute blood loss anemia-hemoglobin improved s/p 1 unit pRBC on 8/2. -Recheck CBC in the morning -Holding anticoagulation  Pancytopenia- likely related to her chemo and myelodysplastic syndrome -Oncology following -Holding chemotherapy for now  Right rib and right humerus fracture s/p mechanical fall -Orthopedic surgery recommended right shoulder immobilization -Pain control -Incentive spirometry -PT recommending home health PT  AML -Oncology following -Holding chemotherapy for now  Essential hypertension- BP mildly elevated -Restart home BP meds   DVT prophylaxis- SCDs   All the records are reviewed and case discussed with Care Management/Social Workerr. Management plans discussed with the patient and husband at bedside and they are in agreement.  CODE STATUS: FULL  TOTAL TIME TAKING CARE OF THIS PATIENT: 36 minutes.   POSSIBLE D/C IN 1-2 DAYS, DEPENDING ON CLINICAL CONDITION.  Note: This dictation was prepared with Dragon dictation along with smaller phrase technology. Any transcriptional errors that result from this process  are unintentional.   Evette Doffing M.D on 01/13/2019 at 3:55 PM  Between 7am to 6pm - Pager - [phone]  After 6pm go to [email or web address] - password [email or web address]  Office  [phone]  CC: Primary care physician; Rusty Aus, MD

## 2019-01-13 NOTE — Progress Notes (Signed)
Physical Therapy Treatment Patient Details Name: Kathryn Hahn MRN: [id number] DOB: March 26, 1934 Today's Date: 01/13/2019    History of Present Illness Kathryn Hahn is an 85 y.o. female who arrived at hospital ED by EMS on 01/10/2019 from [facility] s/p a mechanical fall where she hit her head, complaining of R shoulder and rib pain. She currently has treatment at the cancer center for acute myeloid leukemia with failed remission. She was admitted to the hospital for subarachnoid hemorrhage, thrombocytopenia, and R rib and humerus fractures. Relevant PMH includes acute myeloid leukemia with failed remission, leukocytosis, anemia, R humeral fracture (current) MDS/MPN, tumor lysis syndorome, drug induced neutropenia, splenomegaly, hx of brest cancer, hx of kidney stones.  Radiograph 01/10/2019 notes L PICC line, fractures of R 5th through 8th ribs, R humeral neck/head fracture, R breast/axillary surgical clips, cardiomegaly with mild interstitial opacities, trace bilateral pleural effusions. Head CT 01/10/2019 trace posttraumatic hemorrhage along the right anterior frontal lobe, favor trace subarachnoid over small hemorrhagic contusion; Right posterior convexity scalp hematoma without underlying fracture. Orthopedics reccomended sling to R UE.    PT Comments    Pt is making good progress towards goals with improved endurance noted this session. Able to ambulate on RA with improved O2 sats this date. 1 small LOB noted this time towards L side. Continue to recommend 24/7 care at home. Will continue to progress.   Follow Up Recommendations  Home health PT;Supervision/Assistance - 24 hour     Equipment Recommendations  3in1 (PT)    Recommendations for Other Services       Precautions / Restrictions Precautions Precautions: Shoulder Type of Shoulder Precautions: sling to R shoulder (humeral fx) Shoulder Interventions: Shoulder sling/immobilizer;At all times Precaution Booklet  Issued: No Restrictions Weight Bearing Restrictions: Yes RUE Weight Bearing: Non weight bearing    Mobility  Bed Mobility               General bed mobility comments: not performed this date, received in recliner  Transfers Overall transfer level: Needs assistance Equipment used: None Transfers: Sit to/from Stand Sit to Stand: Supervision         General transfer comment: cues to scoot out towards edge prior to performing transfer. Once standing, upright posture noted  Ambulation/Gait Ambulation/Gait assistance: Min guard Gait Distance (Feet): 220 Feet Assistive device: None Gait Pattern/deviations: Step-through pattern     General Gait Details: ambulated around RN station on RA with sats at 91%. Slight SOB symptoms with exertion, requesting O2 to be donned after gait training. 1 LOB needing min assist.   Stairs             Wheelchair Mobility    Modified Rankin (Stroke Patients Only)       Balance Overall balance assessment: Needs assistance Sitting-balance support: Single extremity supported Sitting balance-Leahy Scale: Good     Standing balance support: No upper extremity supported Standing balance-Leahy Scale: Fair Standing balance comment: 1 LOB towards left side needing min assist to correct                            Cognition Arousal/Alertness: Awake/alert Behavior During Therapy: WFL for tasks assessed/performed Overall Cognitive Status: Within Functional Limits for tasks assessed                                        Exercises  General Comments        Pertinent Vitals/Pain Pain Assessment: Faces Faces Pain Scale: Hurts a little bit Pain Location: Ribs Pain Descriptors / Indicators: Discomfort Pain Intervention(s): Limited activity within patient's tolerance;Repositioned    Home Living                      Prior Function            PT Goals (current goals can now be found in  the care plan section) Acute Rehab PT Goals Patient Stated Goal: return home and return to PLOF PT Goal Formulation: With patient Time For Goal Achievement: 01/25/19 Potential to Achieve Goals: Good Progress towards PT goals: Progressing toward goals    Frequency    7X/week      PT Plan Current plan remains appropriate    Co-evaluation              AM-PAC PT "6 Clicks" Mobility   Outcome Measure  Help needed turning from your back to your side while in a flat bed without using bedrails?: A Little Help needed moving from lying on your back to sitting on the side of a flat bed without using bedrails?: A Little Help needed moving to and from a bed to a chair (including a wheelchair)?: A Little Help needed standing up from a chair using your arms (e.g., wheelchair or bedside chair)?: A Little Help needed to walk in hospital room?: A Little Help needed climbing 3-5 steps with a railing? : A Little 6 Click Score: 18    End of Session Equipment Utilized During Treatment: (sling) Activity Tolerance: Patient tolerated treatment well Patient left: in chair;with chair alarm set Nurse Communication: Mobility status PT Visit Diagnosis: Unsteadiness on feet (R26.81);History of falling (Z91.81);Difficulty in walking, not elsewhere classified (R26.2);Muscle weakness (generalized) (M62.81)     Time: 1779-3903 PT Time Calculation (min) (ACUTE ONLY): 11 min  Charges:  $Gait Training: 8-22 mins                     Greggory Stallion, PT, DPT [phone]    [name] 01/13/2019, 10:46 AM

## 2019-01-13 NOTE — Telephone Encounter (Signed)
Spoke to patient's granddaughter Allison-updated patient's clinical status.

## 2019-01-13 NOTE — Progress Notes (Signed)
   01/13/19 1600  Clinical Encounter Type  Visited With Patient not available;Health care provider  Visit Type Initial  Referral From Nurse   Chaplain received a referral to support the patient. Upon arrival, the patient was observed to be sleeping soundly with the lights off. Chaplain will attempt to visit at a later time.

## 2019-01-13 NOTE — Assessment & Plan Note (Addendum)
#  85 year old female patient with acute myeloid leukemia is currently admitted to hospital for mechanical fall/subarachnoid hemorrhage/humeral rib fractures.  #Acute myeloid leukemia with monocytic differentiation-status post Vidaza venetoclax cycle #1; currently off Venatoclax [since 7/17] sec to cytopenias.  Bone marrow biopsy day #28-negative for blasts.  Continue to hold Vidaza venetoclax.  Stable.  #Pancytopenia -MDS/venetoclax.  Hemoglobin 8.5 platelets 30; white count-3.8 status post Granix today.  Proceed with 1 more dose of Granix today.  Patient could be discharged home on home prophylactic antibiotics [Levaquin/a acyclovir/posaconazole ] #Hypoxia-on room air 85% on 2 L -94.  Likely secondary atelectasis.  Recommend spirometry pain control.  #Status post fall-humeral fracture refracture-conservative measures.  Follow-up with orthopedics.   #Intracranial bleed-status post fall.  Stable.  Platelets 30.  Monitor closely.  #Discussed with patient's granddaughter.  Discussed with Dr. Brett Albino.  Patient is hematologically/oncological stable to be discharged.  We will plan follow-up closely/end of this week.

## 2019-01-13 NOTE — Evaluation (Signed)
Occupational Therapy Evaluation Patient Details Name: Kathryn Hahn MRN: [id number] DOB: 03-03-1934 Today's Date: 01/13/2019    History of Present Illness Kathryn Hahn is an 85 y.o. female who arrived at hospital ED by EMS on 01/10/2019 from [place] [facility] s/p a mechanical fall where she hit her head, complaining of R shoulder and rib pain. She currently has treatment at the cancer center for acute myeloid leukemia with failed remission. She was admitted to the hospital for subarachnoid hemorrhage, thrombocytopenia, and R rib and humerus fractures. Relevant PMH includes acute myeloid leukemia with failed remission, leukocytosis, anemia, R humeral fracture (current) MDS/MPN, tumor lysis syndorome, drug induced neutropenia, splenomegaly, hx of brest cancer, hx of kidney stones.  Radiograph 01/10/2019 notes L PICC line, fractures of R 5th through 8th ribs, R humeral neck/head fracture, R breast/axillary surgical clips, cardiomegaly with mild interstitial opacities, trace bilateral pleural effusions. Head CT 01/10/2019 trace posttraumatic hemorrhage along the right anterior frontal lobe, favor trace subarachnoid over small hemorrhagic contusion; Right posterior convexity scalp hematoma without underlying fracture. Orthopedics reccomended sling to R UE.   Clinical Impression   Patient was seen for an OT evaluation this date. Pt lives with her spouse at in an independent living community. Prior to surgery, pt was independent in all aspects of ADL tasks. Her husband assists her with driving and heavy IADLs as she has been experiencing increased weakness and fatigue for the past few weeks 2/2 her myeloid leukemia treatments. Pt plans to stay with her husband to assist at home while recovering. Pt has orders for R (dominant) UE to be immobilized and will be NWBing per MD. Patient presents with impaired strength/ROM and pain to RUE and ribs particularly with mobility. These impairments result in a  decreased ability to perform self care tasks requiring mod assist for UB dressing and bathing and max assist for application of compression stockings, and sling/immobilizer. Pt instructed in compression stockings mgt, sling/immobilizer mgt, RUE precautions, adaptive strategies for bathing/dressing/toileting/grooming, positioning and considerations for sleep, and home/routines modifications to maximize falls prevention, safety, and independence. OT adjusted sling/immobilizer to improve comfort, optimize positioning, and to maximize skin integrity/safety. Pt verbalized understanding of all education/training provided. Pt will benefit from skilled OT services to address these limitations and improve independence in daily tasks. Recommend HHOT services to continue therapy to maximize return to PLOF, address home/routines modifications and safety, minimize falls risk, and minimize caregiver burden.       Follow Up Recommendations  Home health OT    Equipment Recommendations  3 in 1 bedside commode;Tub/shower seat    Recommendations for Other Services       Precautions / Restrictions Precautions Precautions: Shoulder Type of Shoulder Precautions: sling to R shoulder (humeral fx) Shoulder Interventions: Shoulder sling/immobilizer;At all times Precaution Booklet Issued: No Restrictions Weight Bearing Restrictions: Yes RUE Weight Bearing: Non weight bearing Other Position/Activity Restrictions: Strictly NWB at all times per Dr. Marry Guan      Mobility Bed Mobility Overal bed mobility: Needs Assistance Bed Mobility: Sit to Supine;Supine to Sit Rolling: Min assist(Assist for trunk mgt and minor LE mgt oob.)   Supine to sit: Min assist     General bed mobility comments: not performed this date, received in recliner  Transfers Overall transfer level: Needs assistance Equipment used: None Transfers: Sit to/from Stand Sit to Stand: Supervision         General transfer comment: VC's for  technique. Demonstrated good safety awareness.    Balance Overall balance assessment: Needs assistance Sitting-balance support:  Single extremity supported Sitting balance-Leahy Scale: Good Sitting balance - Comments: Steady reaching within BOS. Able to weight shift during peri-care on commode w/o LOB.   Standing balance support: No upper extremity supported Standing balance-Leahy Scale: Fair Standing balance comment: 1 LOB towards left side needing min assist to correct                           ADL either performed or assessed with clinical judgement   ADL Overall ADL's : Needs assistance/impaired Eating/Feeding: Set up;Sitting;Minimal assistance Eating/Feeding Details (indicate cue type and reason): Pt limited in bimanual tasks such as opening containers and cutting food. Would require min A. Pt also RUE dominant, so is further limited by RUE NWB status. Grooming: Set up;Sitting;Moderate assistance   Upper Body Bathing: Set up;Moderate assistance;Sitting   Lower Body Bathing: Set up;With adaptive equipment;Sitting/lateral leans;Minimal assistance   Upper Body Dressing : Moderate assistance;Set up;Sitting   Lower Body Dressing: Sit to/from stand;Minimal assistance;Set up   Toilet Transfer: Regular Toilet;Ambulation;Min Radio broadcast assistant Details (indicate cue type and reason): Min guard to supervision assist to transfer to room commode on this date. Pt with 1 instance of LOB during amb. Generally, supervision level. Toileting- Clothing Manipulation and Hygiene: Minimal assistance;Set up;Sit to/from stand       Functional mobility during ADLs: Supervision/safety;Min guard       Vision Baseline Vision/History: No visual deficits Patient Visual Report: No change from baseline       Perception     Praxis      Pertinent Vitals/Pain Pain Assessment: Faces Pain Score: 6  Faces Pain Scale: Hurts a little bit Pain Location: RUE/Ribs with  mobility Pain Descriptors / Indicators: Aching;Sore;Grimacing;Guarding Pain Intervention(s): Limited activity within patient's tolerance;Monitored during session;Repositioned;Utilized relaxation techniques     Hand Dominance Right   Extremity/Trunk Assessment Upper Extremity Assessment Upper Extremity Assessment: RUE deficits/detail;LUE deficits/detail RUE: Unable to fully assess due to pain;Unable to fully assess due to immobilization LUE Deficits / Details: generalized weakness in L UE, PICC line present, pt reports non-dominant hand.   Lower Extremity Assessment Lower Extremity Assessment: Generalized weakness   Cervical / Trunk Assessment Cervical / Trunk Assessment: Other exceptions Cervical / Trunk Exceptions: Ribs very painful upon movement, restricts movement   Communication Communication Communication: HOH   Cognition Arousal/Alertness: Awake/alert Behavior During Therapy: WFL for tasks assessed/performed Overall Cognitive Status: Within Functional Limits for tasks assessed                                     General Comments  Sling immobilizer in place at start/end of session.    Exercises Other Exercises Other Exercises: Pt educated in falls prevention strategies, adapted strategies for dressing, bathing, and self-care tasks, and safe use of AE to support ADLs.   Shoulder Instructions      Home Living Family/patient expects to be discharged to:: Private residence([facility]) Living Arrangements: Spouse/significant other Available Help at Discharge: Family;Available 24 hours/day Type of Home: Independent living facility Home Access: Elevator     Home Layout: One level     Bathroom Shower/Tub: Occupational psychologist: Handicapped height Bathroom Accessibility: Yes How Accessible: Accessible via walker Home Equipment: Grab bars - tub/shower;Toilet riser;Grab bars - toilet   Additional Comments: patient reports she does not have  a cane, walker, or BSC      Prior Functioning/Environment Level  of Independence: Independent        Comments: Patient reports up until 3 months ago she ambulated 3 miles a day at a 15 min mile pace. More recently she has been ambulating independently in the home only with no AD and not using furnature or walls for support. She is I with ADLs and requires some help with meal prep, housework, yardwork, and transportation.        OT Problem List: Decreased strength;Decreased coordination;Pain;Decreased range of motion;Decreased activity tolerance;Decreased safety awareness;Impaired balance (sitting and/or standing);Decreased knowledge of use of DME or AE;Decreased knowledge of precautions;Impaired UE functional use      OT Treatment/Interventions: Self-care/ADL training;Balance training;Therapeutic exercise;Therapeutic activities;Energy conservation;DME and/or AE instruction;Patient/family education    OT Goals(Current goals can be found in the care plan section) Acute Rehab OT Goals Patient Stated Goal: return home and return to PLOF OT Goal Formulation: With patient Time For Goal Achievement: 01/27/19 Potential to Achieve Goals: Good ADL Goals Pt Will Perform Eating: with set-up;sitting Pt Will Perform Grooming: with set-up;sitting;with adaptive equipment(With LRAD PRN for improved safety and functional independence.) Pt Will Perform Upper Body Dressing: with min assist;sitting(With LRAD PRN for improved safety and functional independence.)  OT Frequency: Min 1X/week   Barriers to D/C:            Co-evaluation              AM-PAC OT "6 Clicks" Daily Activity     Outcome Measure Help from another person eating meals?: A Little Help from another person taking care of personal grooming?: A Little Help from another person toileting, which includes using toliet, bedpan, or urinal?: A Little Help from another person bathing (including washing, rinsing, drying)?: A Lot Help  from another person to put on and taking off regular upper body clothing?: A Lot Help from another person to put on and taking off regular lower body clothing?: A Little 6 Click Score: 16   End of Session Equipment Utilized During Treatment: Gait belt Nurse Communication: Mobility status;Other (comment)(Pt up in chair.)  Activity Tolerance: Patient tolerated treatment well Patient left: in chair;with chair alarm set;with call bell/phone within reach  OT Visit Diagnosis: Other abnormalities of gait and mobility (R26.89);History of falling (Z91.81);Pain Pain - Right/Left: Right Pain - part of body: Shoulder;Arm                Time: 6568-1275 OT Time Calculation (min): 37 min Charges:  OT General Charges $OT Visit: 1 Visit OT Evaluation $OT Eval Moderate Complexity: 1 Mod OT Treatments $Self Care/Home Management : 23-37 mins  Shara Blazing, M.S., OTR/L Ascom: [phone] 01/13/19, 11:24 AM

## 2019-01-13 NOTE — TOC Progression Note (Signed)
Transition of Care [facility]) - Progression Note    Patient Details  Name: Basil Manas MRN: [id number] Date of Birth: 03-May-1934  Transition of Care [facility]) CM/SW Todd Mission, RN Phone Number: 01/13/2019, 9:41 AM  Clinical Narrative:    Damaris Schooner to Maudie Mercury at [facility], she will call me with a bed number   Expected Discharge Plan: [place]    Expected Discharge Plan and Services Expected Discharge Plan: [place]   Discharge Planning Services: CM Consult Post Acute Care Choice: Skilled Nursing Facility(patient would like to go to SNF at [facility])                                         Social Determinants of Health (SDOH) Interventions    Readmission Risk Interventions No flowsheet data found.

## 2019-01-13 NOTE — Progress Notes (Addendum)
Subjective :Patient is day 3 fracture proximal right knee numerous as well was rib fractures 5 through 8 right side Patient reports pain as mild.   no nausea and no vomiting Resting well with no new complaints other than soreness to the right ribs   Objective: Vital signs in last 24 hours: Temp:  [97.5 F (36.4 C)-98.2 F (36.8 C)] 97.8 F (36.6 C) (08/02 1647) Pulse Rate:  [67-90] 90 (08/02 2320) Resp:  [16-20] 18 (08/02 2320) BP: (119-149)/(68-82) 146/82 (08/02 2320) SpO2:  [96 %-100 %] 96 % (08/02 2320) Moving right wrist and fingers well. Normal sensation to touch. Capillary refill intact. Swelling diffusely to the proximal shoulder with mild tenderness to palpation. No tissue breakdown noted  Intake/Output from previous day: 08/02 0701 - 08/03 0700 In: 660 [P.O.:360; Blood:300] Out: -  Intake/Output this shift: No intake/output data recorded.  Recent Labs    01/10/19 0808 01/11/19 1554 01/12/19 0418 01/12/19 1209 01/13/19 0447  HGB 7.4* 6.8* 6.8* 8.5* 8.1*   Recent Labs    01/12/19 0418 01/13/19 0447  WBC 1.8* 1.3*  RBC 2.81* 3.25*  HCT 21.6* 25.3*  PLT 35* 30*   Recent Labs    01/10/19 0808  NA 137  K 3.5  CL 103  CO2 25  BUN 19  CREATININE 0.54  GLUCOSE 137*  CALCIUM 8.7*   Recent Labs    01/10/19 1418  INR 1.2    Neurologically intact Neurovascular intact Sensation intact distally Intact pulses distally Compartment soft   WBC 1.3 with hemoglobin of 8.1 after transfusion  Assessment/Plan: Still discussing case with Dr. Roland Rack and Dr. Posey Pronto Case management to assist with discharge planning Physical therapy today.  Okay to ambulate Bowel movement today Plan to discharge when medically cleared.  Will need to follow-up in clinical clinic orthopedics this week. Recommend holding any anticoagulation medication Continue shoulder immobilizer   Watt Climes 01/13/2019, 6:50 AM

## 2019-01-13 NOTE — Plan of Care (Signed)
  Problem: Education: Goal: Knowledge of General Education information will improve Description: Including pain rating scale, medication(s)/side effects and non-pharmacologic comfort measures Outcome: Progressing   Problem: Clinical Measurements: Goal: Ability to maintain clinical measurements within normal limits will improve Outcome: Progressing Goal: Will remain free from infection Outcome: Progressing Goal: Diagnostic test results will improve Outcome: Progressing Goal: Respiratory complications will improve Outcome: Progressing Goal: Cardiovascular complication will be avoided Outcome: Progressing   Problem: Coping: Goal: Level of anxiety will decrease Outcome: Progressing   Problem: Nutrition: Goal: Adequate nutrition will be maintained Outcome: Progressing   Problem: Elimination: Goal: Will not experience complications related to bowel motility Outcome: Progressing Goal: Will not experience complications related to urinary retention Outcome: Progressing

## 2019-01-14 ENCOUNTER — Inpatient Hospital Stay: Payer: Medicare Other

## 2019-01-14 ENCOUNTER — Telehealth: Payer: Self-pay | Admitting: *Deleted

## 2019-01-14 ENCOUNTER — Other Ambulatory Visit: Payer: Self-pay | Admitting: *Deleted

## 2019-01-14 ENCOUNTER — Encounter
Admission: RE | Admit: 2019-01-14 | Discharge: 2019-01-14 | Disposition: A | Discharge status: Home / Self Care | Payer: Medicare Other | Source: Ambulatory Visit | Attending: Internal Medicine | Admitting: Internal Medicine

## 2019-01-14 ENCOUNTER — Other Ambulatory Visit: Payer: Self-pay | Admitting: Internal Medicine

## 2019-01-14 ENCOUNTER — Telehealth: Payer: Self-pay | Admitting: Internal Medicine

## 2019-01-14 DIAGNOSIS — C9200 Acute myeloblastic leukemia, not having achieved remission: Secondary | ICD-10-CM

## 2019-01-14 LAB — BASIC METABOLIC PANEL
Anion gap: 7 (ref 5–15)
BUN: 16 mg/dL (ref 8–23)
CO2: 31 mmol/L (ref 22–32)
Calcium: 8.7 mg/dL — ABNORMAL LOW (ref 8.9–10.3)
Chloride: 98 mmol/L (ref 98–111)
Creatinine, Ser: 0.56 mg/dL (ref 0.44–1.00)
GFR calc Af Amer: >60 mL/min (ref >60)
GFR calc non Af Amer: >60 mL/min (ref >60)
Glucose, Bld: 123 mg/dL — ABNORMAL HIGH (ref 70–99)
Potassium: 3 mmol/L — ABNORMAL LOW (ref 3.5–5.1)
Sodium: 136 mmol/L (ref 135–145)

## 2019-01-14 LAB — CBC
HCT: 26.8 % — ABNORMAL LOW (ref 36.0–46.0)
Hemoglobin: 8.5 g/dL — ABNORMAL LOW (ref 12.0–15.0)
MCH: 24.4 pg — ABNORMAL LOW (ref 26.0–34.0)
MCHC: 31.7 g/dL (ref 30.0–36.0)
MCV: 77 fL — ABNORMAL LOW (ref 80.0–100.0)
Platelets: 30 10*3/uL — ABNORMAL LOW (ref 150–400)
RBC: 3.48 MIL/uL — ABNORMAL LOW (ref 3.87–5.11)
RDW: 20.8 % — ABNORMAL HIGH (ref 11.5–15.5)
WBC: 3.8 10*3/uL — ABNORMAL LOW (ref 4.0–10.5)
nRBC: 2.6 % — ABNORMAL HIGH (ref 0.0–0.2)

## 2019-01-14 LAB — PROCALCITONIN: Procalcitonin: <0.1 ng/mL

## 2019-01-14 MED ORDER — OXYCODONE HCL 5 MG PO CAPS: 5.0000 mg | ORAL_CAPSULE | ORAL | 0 refills | Status: DC | PRN

## 2019-01-14 MED ORDER — POTASSIUM CHLORIDE CRYS ER 20 MEQ PO TBCR
40.0000 meq | EXTENDED_RELEASE_TABLET | ORAL | Status: DC
  Administered 2019-01-14: 40 meq via ORAL
  Filled 2019-01-14: qty 2

## 2019-01-14 MED ORDER — LEVETIRACETAM 250 MG PO TABS: 250.0000 mg | ORAL_TABLET | Freq: Every morning | ORAL | 0 refills | Status: DC

## 2019-01-14 NOTE — TOC Transition Note (Signed)
Transition of Care [facility]) - CM/SW Discharge Note   Patient Details  Name: Kathryn Hahn MRN: [id number] Date of Birth: 08-Dec-1933  Transition of Care [facility]) CM/SW Contact:  Su Hilt, RN Phone Number: 01/14/2019, 11:39 AM   Clinical Narrative:     Patient to discharge to [facility] SNF room 350 The Spouse will transport to [facility] [facility], he is here now.  The room is ready, the nurse will call report to [phone]  Final next level of care: Skilled Nursing Facility Barriers to Discharge: Barriers Resolved   Patient Goals and CMS Choice   CMS Medicare.gov Compare Post Acute Care list provided to:: Patient Choice offered to / list presented to : Patient  Discharge Placement                       Discharge Plan and Services   Discharge Planning Services: CM Consult Post Acute Care Choice: Skilled Nursing Facility(patient would like to go to SNF at [facility])                               Social Determinants of Health (SDOH) Interventions     Readmission Risk Interventions No flowsheet data found.

## 2019-01-14 NOTE — Telephone Encounter (Signed)
She has an apt and lab draw and injection this Friday. Patient needs to keep these apts.

## 2019-01-14 NOTE — Discharge Instructions (Signed)
It was so nice to meet you during this hospitalization!  You came into the hospital with a fall. We found that you had a small brain bleed. You also broke your arm and some of your ribs on the right side.  I have prescribed the following medications: 1. Keppra- please take 1 tablet in the morning for two more days. The neurosurgeon recommended that you take this medicine to prevent seizures 2. For your pain, please take tylenol every 6 hours. I have also prescribed some oxycodone for you to use as needed for severe pain. 3. Dr. Rogue Bussing recommended that you stop taking the Venclexta for now.  Please make sure you use the incentive spirometer every hour while you are awake.  Take care, Dr. Brett Albino

## 2019-01-14 NOTE — Progress Notes (Signed)
Kathryn Hahn   DOB:08-29-1933   XT#:[id number]    Subjective: Complains of pain at the site of rib fractures/right shoulder.  She is able to get out of bed with mild to moderate difficulties.  Denies any headaches but denies any nausea vomiting.   Objective:  Vitals:   01/14/19 0011 01/14/19 0722  BP: 133/70 118/65  Pulse: 88 86  Resp: 15   Temp: 98.3 F (36.8 C) 98.1 F (36.7 C)  SpO2: 97% 97%     Intake/Output Summary (Last 24 hours) at 01/14/2019 2992 Last data filed at 01/13/2019 2149 Gross per 24 hour  Intake 540 ml  Output -  Net 540 ml   Physical Exam  Constitutional: She is oriented to person, place, and time.  Frail-appearing Caucasian female patient.  Resting in the bed.  On 2 L of nasal cannula.  HENT:  Head: Normocephalic and atraumatic.  Mouth/Throat: Oropharynx is clear and moist. No oropharyngeal exudate.  Eyes: Pupils are equal, round, and reactive to light.  Neck: Normal range of motion. Neck supple.  Cardiovascular: Normal rate and regular rhythm.  Pulmonary/Chest: No respiratory distress. She has no wheezes.  Decreased air entry bilaterally lower lung bases.  Abdominal: Soft. Bowel sounds are normal. She exhibits no distension and no mass. There is no abdominal tenderness. There is no rebound and no guarding.  Musculoskeletal: Normal range of motion.        General: No tenderness or edema.  Neurological: She is alert and oriented to person, place, and time.  Skin: Skin is warm.  Multiple bruises noted.  Psychiatric: Affect normal.    Labs:  Lab Results  Component Value Date   WBC 3.8 (L) 01/14/2019   HGB 8.5 (L) 01/14/2019   HCT 26.8 (L) 01/14/2019   MCV 77.0 (L) 01/14/2019   PLT 30 (L) 01/14/2019   NEUTROABS 0.2 (L) 01/13/2019    Lab Results  Component Value Date   NA 136 01/14/2019   K 3.0 (L) 01/14/2019   CL 98 01/14/2019   CO2 31 01/14/2019    Studies:  Dg Chest 1 View  Result Date: 01/14/2019 CLINICAL DATA:  Hypoxia. EXAM: CHEST   1 VIEW COMPARISON:  01/13/2019. FINDINGS: Left PICC line in stable position. Stable cardiomegaly. Dense bibasilar atelectasis/infiltrates again noted. Small bilateral pleural effusions. Prominent deformity of the right chest with multiple right rib fractures again noted. Associated right upper pleural thickening is noted. No pneumothorax. Proximal right humeral fracture. Surgical clips right chest. IMPRESSION: 1.  Left PICC line stable position 2.  Stable cardiomegaly. 3. Dense bibasilar atelectasis/infiltrates again noted. Small bilateral pleural effusions again noted. 4. Severe deformity of the right chest with multiple right rib fractures again noted. Associated right upper lateral pleural thickening is noted. No pneumothorax. 5.  Proximal right humeral fracture. Electronically Signed   By: Marcello Moores  Register   On: 01/14/2019 05:36   Dg Chest 1 View  Result Date: 01/13/2019 CLINICAL DATA:  Recent history of fall. Hypoxia. History of breast cancer. History of leukemia. EXAM: CHEST  1 VIEW COMPARISON:  01/10/2019. FINDINGS: PICC line stable position. Mediastinum and hilar structures are stable. Stable cardiomegaly. Dense bibasilar atelectasis/infiltrate noted on today's exam. Small bilateral pleural effusions. Deformity noted of the right chest with multiple right rib fractures again noted. Surgical clips right chest. IMPRESSION: 1.  PICC line in stable position. 2.  Stable cardiomegaly. 3. Dense bibasilar atelectasis/infiltrates noted on today's exam. Small bilateral pleural effusions noted. 4. Deformity noted of the right chest with multiple  right rib fractures again noted. No pneumothorax. Electronically Signed   By: Marcello Moores  Register   On: 01/13/2019 12:36    AML (acute myeloid leukemia) with failed remission Kathryn Hahn) #85 year old female patient with acute myeloid leukemia is currently admitted to hospital for mechanical fall/subarachnoid hemorrhage/humeral rib fractures.  #Acute myeloid leukemia with  monocytic differentiation-status post Vidaza venetoclax cycle #1; currently off Venatoclax [since 7/17] sec to cytopenias.  Bone marrow biopsy day #28-negative for blasts.  Continue to hold Vidaza venetoclax.  Stable.  #Pancytopenia -MDS/venetoclax.  Hemoglobin 8.5 platelets 30; white count-3.8 status post Granix today.  Proceed with 1 more dose of Granix today.  Patient could be discharged home on home prophylactic antibiotics [Levaquin/a acyclovir/posaconazole ] #Hypoxia-on room air 85% on 2 L -94.  Likely secondary atelectasis.  Recommend spirometry pain control.  #Status post fall-humeral fracture refracture-conservative measures.  Follow-up with orthopedics.   #Intracranial bleed-status post fall.  Stable.  Platelets 30.  Monitor closely.  #Discussed with patient's granddaughter.  Discussed with Dr. Brett Albino.  Patient is hematologically/oncological stable to be discharged.  We will plan follow-up closely/end of this week.   Cammie Sickle, MD 01/14/2019  9:17 AM

## 2019-01-14 NOTE — Telephone Encounter (Signed)
Husband informed that she needs to keep her appointment for Friday and he stated he will make it happen

## 2019-01-14 NOTE — Discharge Summary (Signed)
[facility] NAME: Kathryn Hahn    MR#:  [id number]  DATE OF BIRTH:  11-11-1933  DATE OF ADMISSION:  01/10/2019   ADMITTING PHYSICIAN: Dustin Flock, MD  DATE OF DISCHARGE: 01/14/19  PRIMARY CARE PHYSICIAN: Rusty Aus, MD   ADMISSION DIAGNOSIS:  Subarachnoid hemorrhage following injury, no loss of consciousness, initial encounter ([place]) [S06.6X0A] Fall, initial encounter [W19.XXXA] Closed fracture of multiple ribs of right side, initial encounter [S22.41XA] Other closed displaced fracture of proximal end of right humerus, initial encounter [S42.291A] DISCHARGE DIAGNOSIS:  Active Problems:   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 Anemia   . Arthritis   . Breast cancer ([place]) 2003   RT LUMPECTOMY  . Edema leg   . History of breast cancer 2003   post lumpectomy  . History of kidney stones   . Hyperlipemia, mixed   . Hypertension, essential   . Leukemia ([place])   . Leukocytosis   . MDS (myelodysplastic syndrome) ([place])   . Personal history of radiation therapy 2003   BREAST CA  . Renal stones   . Vitamin D deficiency    HOSPITAL COURSE:   Kathryn Hahn is an 85 year old female who presented to the ED with headache and right shoulder pain after a mechanical fall.  In the ED, CT head showed a small subarachnoid hemorrhage.  Chest x-ray showed multiple rib fractures on the right.  Right shoulder x-ray showed a right humeral fracture.  She was admitted for further management.  Small subarachnoid hemorrhage- stable, no new mental status changes -CT  head was repeated x 2 and showed a stable hemorrhage -Neurosurgery recommended Keppra to 250 mg daily for 7 days for seizure prophylaxis -PT recommended HHPT  Acute hypoxic respiratory failure- resolved.  Likely related to rib fractures. -Required supplemental oxygen this hospitalization, but was able to be weaned to room air on the day of discharge -Chest x-ray showed dense bibasilar atelectasis/infiltrates with small bilateral pleural effusions and patient's -Procalcitonin was negative -Incentive spirometry ordered and patient was encouraged to continue to use this at home  Right rib and right humerus fracture s/p mechanical fall -Orthopedic surgery recommended right shoulder immobilization and follow-up in clinic -Tylenol and oxycodone prn pain  Acute blood loss anemia with a history of chronic iron deficiency anemia- hemoglobin stable s/p 1 unit pRBC on 8/2. -Recheck CBC as an outpatient  Pancytopenia- likely related to her chemo and myelodysplastic syndrome -Oncology following -Holding chemotherapy for now  AML -Oncology following -Holding chemotherapy for now  Essential hypertension-stable -Continued home BP meds   DISCHARGE CONDITIONS:  Small subarachnoid hemorrhage Multiple right rib fractures Right humerus fracture Acute blood loss anemia Chronic iron deficiency anemia Pancytopenia AML Essential hypertension CONSULTS OBTAINED:  Treatment Team:  Dereck Leep, MD Leotis Pain, MD Earlie Server, MD DRUG ALLERGIES:   Allergies  Allergen Reactions  . Terbinafine Rash and Swelling   DISCHARGE MEDICATIONS:   Allergies as of 01/14/2019      Reactions   Terbinafine Rash, Swelling      Medication List    STOP taking these medications  allopurinol 300 MG tablet Commonly known as: ZYLOPRIM   iron polysaccharides 150 MG capsule Commonly known as: Ferrex 150   Venclexta 100 MG Tabs Generic drug: venetoclax   vitamin C 500 MG tablet Commonly known  as: ASCORBIC ACID     TAKE these medications   acyclovir 400 MG tablet Commonly known as: ZOVIRAX One pill a day [to prevent shingles]   atenolol 50 MG tablet Commonly known as: TENORMIN Take 1 tablet by mouth 2 (two) times daily.   calcium carbonate 1500 (600 Ca) MG Tabs tablet Commonly known as: OSCAL Take 600 mg of elemental calcium by mouth daily with breakfast.   levETIRAcetam 250 MG tablet Commonly known as: KEPPRA Take 1 tablet (250 mg total) by mouth every morning. Start taking on: January 15, 2019   levofloxacin 250 MG tablet Commonly known as: Levaquin Take 1 tablet (250 mg total) by mouth daily.   losartan-hydrochlorothiazide 50-12.5 MG tablet Commonly known as: HYZAAR Take 1 tablet by mouth daily.   magic mouthwash w/lidocaine Soln Take 5 mLs by mouth 4 (four) times daily as needed for mouth pain.   oxycodone 5 MG capsule Commonly known as: OXY-IR Take 1 capsule (5 mg total) by mouth every 4 (four) hours as needed.   posaconazole 100 MG Tbec delayed-release tablet Commonly known as: NOXAFIL Take 2 tablets (200 mg total) by mouth daily. DO NOT START UNTIL-OK with MD.   potassium chloride SA 20 MEQ tablet Commonly known as: Klor-Con M20 TAKE 1 TABLET BY MOUTH TWICE A DAY What changed:   how much to take  how to take this  when to take this  additional instructions   Vitamin D 50 MCG (2000 UT) tablet Take 1 tablet by mouth daily.        DISCHARGE INSTRUCTIONS:  1.  Follow-up with PCP in 5 days 2.  Follow-up with oncology in 1 week 3.  Follow-up with Ortho in 1 week 4.  Keep shoulder immobilizer in place 5.  Holding chemotherapy for now 6.  Take Keppra twice daily for 2 more days for seizure prophylaxis per neurosurgery recommendations DIET:  Cardiac diet DISCHARGE CONDITION:  Stable ACTIVITY:  Activity as tolerated-continue shoulder immobilizer at all times but may remove the forearm strap for range of motion of elbow, wrist, and fingers  OXYGEN:  Home Oxygen: No.  Oxygen Delivery: room air DISCHARGE LOCATION:  home   If you experience worsening of your admission symptoms, develop shortness of breath, life threatening emergency, suicidal or homicidal thoughts you must seek medical attention immediately by calling 911 or calling your MD immediately  if symptoms less severe.  You Must read complete instructions/literature along with all the possible adverse reactions/side effects for all the Medicines you take and that have been prescribed to you. Take any new Medicines after you have completely understood and accpet all the possible adverse reactions/side effects.   Please note  You were cared for by a hospitalist during your hospital stay. If you have any questions about your discharge medications or the care you received while you were in the hospital after you are discharged, you can call the unit and asked to speak with the hospitalist on call if the hospitalist that took care of you is not available. Once you are discharged, your primary care physician will handle any further medical issues. Please note that NO REFILLS for any discharge medications will be authorized once you are discharged, as it is imperative that you return to your primary care  physician (or establish a relationship with a primary care physician if you do not have one) for your aftercare needs so that they can reassess your need for medications and monitor your lab values.    On the day of Discharge:  VITAL SIGNS:  Blood pressure 118/65, pulse 86, temperature 98.1 F (36.7 C), temperature source Oral, resp. rate 15, height 4\' 10"  (1.473 m), weight 43.1 kg, SpO2 97 %. PHYSICAL EXAMINATION:  GENERAL:  85 y.o.-year-old patient lying in the bed with no acute distress.  EYES: Pupils equal, round, reactive to light and accommodation. No scleral icterus. Extraocular muscles intact.  HEENT: Head atraumatic, normocephalic. Oropharynx and nasopharynx clear.   NECK:  Supple, no jugular venous distention. No thyroid enlargement, no tenderness.  LUNGS: + Diminished breath sounds in the lung bases bilaterally, no wheezing, rales,rhonchi or crepitation. No use of accessory muscles of respiration.  CARDIOVASCULAR: RRR, S1, S2 normal. No murmurs, rubs, or gallops.  ABDOMEN: Soft, non-tender, non-distended. Bowel sounds present. No organomegaly or mass.  EXTREMITIES: No pedal edema, cyanosis, or clubbing. + Right shoulder immobilizer in place NEUROLOGIC: Cranial nerves II through XII are intact.  No focal deficits. sensation intact. Gait not checked.  PSYCHIATRIC: The patient is alert and oriented x 3.  SKIN: No obvious rash, lesion, or ulcer.  DATA REVIEW:   CBC Recent Labs  Lab 01/14/19 0420  WBC 3.8*  HGB 8.5*  HCT 26.8*  PLT 30*    Chemistries  Recent Labs  Lab 01/10/19 0808 01/14/19 0420  NA 137 136  K 3.5 3.0*  CL 103 98  CO2 25 31  GLUCOSE 137* 123*  BUN 19 16  CREATININE 0.54 0.56  CALCIUM 8.7* 8.7*  AST 29  --   ALT 16  --   ALKPHOS 89  --   BILITOT 0.8  --      Microbiology Results  Results for orders placed or performed during the hospital encounter of 01/10/19  SARS Coronavirus 2 (CEPHEID - Performed in [facility] hospital lab), Hosp Order     Status: None   Collection Time: 01/10/19  9:54 AM   Specimen: Nasopharyngeal Swab  Result Value Ref Range Status   SARS Coronavirus 2 NEGATIVE NEGATIVE Final    Comment: (NOTE) If result is NEGATIVE SARS-CoV-2 target nucleic acids are NOT DETECTED. The SARS-CoV-2 RNA is generally detectable in upper and lower  respiratory specimens during the acute phase of infection. The lowest  concentration of SARS-CoV-2 viral copies this assay can detect is 250  copies / mL. A negative result does not preclude SARS-CoV-2 infection  and should not be used as the sole basis for treatment or other  patient management decisions.  A negative result may occur with  improper specimen  collection / handling, submission of specimen other  than nasopharyngeal swab, presence of viral mutation(s) within the  areas targeted by this assay, and inadequate number of viral copies  (<250 copies / mL). A negative result must be combined with clinical  observations, patient history, and epidemiological information. If result is POSITIVE SARS-CoV-2 target nucleic acids are DETECTED. The SARS-CoV-2 RNA is generally detectable in upper and lower  respiratory specimens dur ing the acute phase of infection.  Positive  results are indicative of active infection with SARS-CoV-2.  Clinical  correlation with patient history and other diagnostic information is  necessary to determine patient infection status.  Positive results do  not rule out bacterial infection or co-infection with other viruses. If result is PRESUMPTIVE  POSTIVE SARS-CoV-2 nucleic acids MAY BE PRESENT.   A presumptive positive result was obtained on the submitted specimen  and confirmed on repeat testing.  While 2019 novel coronavirus  (SARS-CoV-2) nucleic acids may be present in the submitted sample  additional confirmatory testing may be necessary for epidemiological  and / or clinical management purposes  to differentiate between  SARS-CoV-2 and other Sarbecovirus currently known to infect humans.  If clinically indicated additional testing with an alternate test  methodology [phone]) is advised. The SARS-CoV-2 RNA is generally  detectable in upper and lower respiratory sp ecimens during the acute  phase of infection. The expected result is Negative. Fact Sheet for Patients:  StrictlyIdeas.no Fact Sheet for Healthcare Providers: BankingDealers.co.za This test is not yet approved or cleared by the [place] FDA and has been authorized for detection and/or diagnosis of SARS-CoV-2 by FDA under an Emergency Use Authorization (EUA).  This EUA will remain in effect  (meaning this test can be used) for the duration of the COVID-19 declaration under Section 564(b)(1) of the Act, 21 U.S.C. section 360bbb-3(b)(1), unless the authorization is terminated or revoked sooner. Performed at [facility], [address], [place], [place] [address]     RADIOLOGY:  Dg Chest 1 View  Result Date: 01/14/2019 CLINICAL DATA:  Hypoxia. EXAM: CHEST  1 VIEW COMPARISON:  01/13/2019. FINDINGS: Left PICC line in stable position. Stable cardiomegaly. Dense bibasilar atelectasis/infiltrates again noted. Small bilateral pleural effusions. Prominent deformity of the right chest with multiple right rib fractures again noted. Associated right upper pleural thickening is noted. No pneumothorax. Proximal right humeral fracture. Surgical clips right chest. IMPRESSION: 1.  Left PICC line stable position 2.  Stable cardiomegaly. 3. Dense bibasilar atelectasis/infiltrates again noted. Small bilateral pleural effusions again noted. 4. Severe deformity of the right chest with multiple right rib fractures again noted. Associated right upper lateral pleural thickening is noted. No pneumothorax. 5.  Proximal right humeral fracture. Electronically Signed   By: Marcello Moores  Register   On: 01/14/2019 05:36   Dg Chest 1 View  Result Date: 01/13/2019 CLINICAL DATA:  Recent history of fall. Hypoxia. History of breast cancer. History of leukemia. EXAM: CHEST  1 VIEW COMPARISON:  01/10/2019. FINDINGS: PICC line stable position. Mediastinum and hilar structures are stable. Stable cardiomegaly. Dense bibasilar atelectasis/infiltrate noted on today's exam. Small bilateral pleural effusions. Deformity noted of the right chest with multiple right rib fractures again noted. Surgical clips right chest. IMPRESSION: 1.  PICC line in stable position. 2.  Stable cardiomegaly. 3. Dense bibasilar atelectasis/infiltrates noted on today's exam. Small bilateral pleural effusions noted. 4. Deformity noted of the right chest  with multiple right rib fractures again noted. No pneumothorax. Electronically Signed   By: Marcello Moores  Register   On: 01/13/2019 12:36     Management plans discussed with the patient, family and they are in agreement.  CODE STATUS: Full Code   TOTAL TIME TAKING CARE OF THIS PATIENT: 40 minutes.    Kathryn Hahn M.D on 01/14/2019 at 9:54 AM  Between 7am to 6pm - Pager - [phone]  After 6pm go to [email or web address] - Proofreader  [facility]  Office  [phone]  CC: Primary care physician; Rusty Aus, MD   Note: This dictation was prepared with Dragon dictation along with smaller phrase technology. Any transcriptional errors that result from this process are unintentional.

## 2019-01-14 NOTE — Progress Notes (Signed)
Discharge summary reviewed with verbal understanding. Answered all questions. Spouse is transporting to [facility] for rehab.

## 2019-01-14 NOTE — Progress Notes (Signed)
Physical Therapy Treatment Patient Details Name: Kathryn Hahn MRN: [id number] DOB: 02-Aug-1933 Today's Date: 01/14/2019    History of Present Illness Kathryn Hahn is an 85 y.o. female who arrived at hospital ED by EMS on 01/10/2019 from [facility] s/p a mechanical fall where she hit her head, complaining of R shoulder and rib pain. She currently has treatment at the cancer center for acute myeloid leukemia with failed remission. She was admitted to the hospital for subarachnoid hemorrhage, thrombocytopenia, and R rib and humerus fractures. Relevant PMH includes acute myeloid leukemia with failed remission, leukocytosis, anemia, R humeral fracture (current) MDS/MPN, tumor lysis syndorome, drug induced neutropenia, splenomegaly, hx of brest cancer, hx of kidney stones.  Radiograph 01/10/2019 notes L PICC line, fractures of R 5th through 8th ribs, R humeral neck/head fracture, R breast/axillary surgical clips, cardiomegaly with mild interstitial opacities, trace bilateral pleural effusions. Head CT 01/10/2019 trace posttraumatic hemorrhage along the right anterior frontal lobe, favor trace subarachnoid over small hemorrhagic contusion; Right posterior convexity scalp hematoma without underlying fracture. Orthopedics reccomended sling to R UE.    PT Comments    Pt in recliner, awaiting discharge but wanting to walk.  Stood with min guard and was able to walk around unit with HHA +1. Pt taking several self initiated rest breaks due to SOB.  Sats checked upon sitting 85% on room air.  Encouraged deep breathing and they slowly increased to low 90's with time.  Encouraged spirometer use.  RN made aware of sats.  Husband in and encouraged him to have therapy check with mobility upon transfer to [facility].   Follow Up Recommendations  Home health PT;Supervision/Assistance - 24 hourHHPT, assist with mobility     Equipment Recommendations  3in1 (PT)    Recommendations for Other  Services       Precautions / Restrictions Precautions Precautions: Shoulder Type of Shoulder Precautions: sling to R shoulder (humeral fx) Shoulder Interventions: Shoulder sling/immobilizer;At all times Precaution Booklet Issued: No Required Braces or Orthoses: Sling Restrictions Weight Bearing Restrictions: Yes RUE Weight Bearing: Non weight bearing Other Position/Activity Restrictions: Strictly NWB at all times per Dr. Marry Guan    Mobility  Bed Mobility               General bed mobility comments: in recliner  Transfers Overall transfer level: Needs assistance Equipment used: None Transfers: Sit to/from Stand Sit to Stand: Supervision         General transfer comment: VC's for technique. Demonstrated good safety awareness.  Ambulation/Gait Ambulation/Gait assistance: Min guard Gait Distance (Feet): 220 Feet Assistive device: 1 person hand held assist Gait Pattern/deviations: Step-through pattern;Decreased step length - right;Decreased step length - left Gait velocity: decreased   General Gait Details: Some SOB noted and self initated rest breaks   Stairs             Wheelchair Mobility    Modified Rankin (Stroke Patients Only)       Balance Overall balance assessment: Needs assistance Sitting-balance support: Single extremity supported Sitting balance-Leahy Scale: Good     Standing balance support: No upper extremity supported Standing balance-Leahy Scale: Fair Standing balance comment: Does well with 1 HHA                            Cognition Arousal/Alertness: Awake/alert Behavior During Therapy: WFL for tasks assessed/performed Overall Cognitive Status: Within Functional Limits for tasks assessed  General Comments: HOH - needs repeating at times.  Hearing aides at home.      Exercises      General Comments        Pertinent Vitals/Pain Pain Assessment: 0-10 Pain Score:  2  Pain Location: RUE/Ribs with mobility Pain Descriptors / Indicators: Aching;Sore;Grimacing;Guarding Pain Intervention(s): Limited activity within patient's tolerance;Monitored during session    Home Living                      Prior Function            PT Goals (current goals can now be found in the care plan section) Progress towards PT goals: Progressing toward goals    Frequency    7X/week      PT Plan Current plan remains appropriate    Co-evaluation              AM-PAC PT "6 Clicks" Mobility   Outcome Measure  Help needed turning from your back to your side while in a flat bed without using bedrails?: A Little Help needed moving from lying on your back to sitting on the side of a flat bed without using bedrails?: A Little Help needed moving to and from a bed to a chair (including a wheelchair)?: A Little Help needed standing up from a chair using your arms (e.g., wheelchair or bedside chair)?: A Little Help needed to walk in hospital room?: A Little Help needed climbing 3-5 steps with a railing? : A Little 6 Click Score: 18    End of Session Equipment Utilized During Treatment: Gait belt Activity Tolerance: Patient tolerated treatment well;Other (comment) Patient left: in chair;with call bell/phone within reach;with chair alarm set Nurse Communication: Other (comment)       Time: 3016-0109 PT Time Calculation (min) (ACUTE ONLY): 11 min  Charges:  $Gait Training: 8-22 mins                    Chesley Noon, PTA 01/14/19, 12:33 PM

## 2019-01-14 NOTE — Telephone Encounter (Signed)
Mr Badertscher called asking ow you will be monitoring on Kathryn Hahn's blood while she is in the facility. Please advise

## 2019-01-14 NOTE — Progress Notes (Signed)
Subjective :Patient is due for fracture proximal right humerus as well as rib fractures Patient reports pain as mild.  No real change in pain status.  May be a little better. no nausea and no vomiting Denies any chest pain during shortness of breath. Denies any radicular symptoms to the right upper extremity   Objective: Vital signs in last 24 hours: Temp:  [98 F (36.7 C)-98.3 F (36.8 C)] 98.3 F (36.8 C) (08/04 0011) Pulse Rate:  [81-90] 88 (08/04 0011) Resp:  [15-18] 15 (08/04 0011) BP: (133-145)/(70-82) 133/70 (08/04 0011) SpO2:  [97 %-100 %] 97 % (08/04 0011) Has good range of motion of the elbow and wrist. Radial pulse 2+ Sensation light touch to the right upper extremity within normal limits Still some swelling to the proximal right shoulder region.  Intake/Output from previous day: 08/03 0701 - 08/04 0700 In: 540 [P.O.:540] Out: -  Intake/Output this shift: Total I/O In: 60 [P.O.:60] Out: -   Recent Labs    01/11/19 1554 01/12/19 0418 01/12/19 1209 01/13/19 0447 01/14/19 0420  HGB 6.8* 6.8* 8.5* 8.1* 8.5*   Recent Labs    01/13/19 0447 01/14/19 0420  WBC 1.3* 3.8*  RBC 3.25* 3.48*  HCT 25.3* 26.8*  PLT 30* 30*   Recent Labs    01/14/19 0420  NA 136  K 3.0*  CL 98  CO2 31  BUN 16  CREATININE 0.56  GLUCOSE 123*  CALCIUM 8.7*   No results for input(s): LABPT, INR in the last 72 hours.  Potassium 3.0 down from 3.5.  Hemoglobin 8.5 up from 8.1.  WBCs 3.5 up from 1.3  Neurologically intact Neurovascular intact Sensation intact distally Intact pulses distally Compartment soft  Assessment/Plan: After discussion with Dr. Roland Rack about the shoulder it has been decided that this is nonsurgical at this time. Case management to assist with discharge planning Physical therapy today Bowel movement today Plan to discharge when medically cleared Will need to follow-up in congenital clinic in approximately 1 week Continue shoulder immobilizer at all  times but may remove the forearm strap for range of motion of elbow wrist and fingers   Watt Climes 01/14/2019, 6:32 AM

## 2019-01-14 NOTE — Telephone Encounter (Signed)
Patient likely be discharged from the hospital today.   Please have the patient follow-up with me on August 7th Friday; MD/labs-CBC BMP/possible nivestym.

## 2019-01-15 ENCOUNTER — Inpatient Hospital Stay: Payer: Medicare Other

## 2019-01-15 DIAGNOSIS — S2241XD Multiple fractures of ribs, right side, subsequent encounter for fracture with routine healing: Secondary | ICD-10-CM

## 2019-01-16 ENCOUNTER — Other Ambulatory Visit: Payer: Self-pay

## 2019-01-16 ENCOUNTER — Inpatient Hospital Stay: Payer: Medicare Other

## 2019-01-17 ENCOUNTER — Inpatient Hospital Stay: Payer: Medicare Other

## 2019-01-17 ENCOUNTER — Encounter: Payer: Self-pay | Admitting: Internal Medicine

## 2019-01-17 ENCOUNTER — Inpatient Hospital Stay: Payer: Medicare Other | Attending: Internal Medicine | Admitting: *Deleted

## 2019-01-17 ENCOUNTER — Other Ambulatory Visit: Payer: Self-pay

## 2019-01-17 ENCOUNTER — Inpatient Hospital Stay (HOSPITAL_BASED_OUTPATIENT_CLINIC_OR_DEPARTMENT_OTHER): Payer: Medicare Other | Admitting: Internal Medicine

## 2019-01-17 DIAGNOSIS — D696 Thrombocytopenia, unspecified: Secondary | ICD-10-CM | POA: Insufficient documentation

## 2019-01-17 DIAGNOSIS — R740 Nonspecific elevation of levels of transaminase and lactic acid dehydrogenase [LDH]: Secondary | ICD-10-CM | POA: Diagnosis not present

## 2019-01-17 DIAGNOSIS — R634 Abnormal weight loss: Secondary | ICD-10-CM | POA: Diagnosis not present

## 2019-01-17 DIAGNOSIS — E876 Hypokalemia: Secondary | ICD-10-CM | POA: Insufficient documentation

## 2019-01-17 DIAGNOSIS — Z853 Personal history of malignant neoplasm of breast: Secondary | ICD-10-CM | POA: Diagnosis not present

## 2019-01-17 DIAGNOSIS — Z5111 Encounter for antineoplastic chemotherapy: Secondary | ICD-10-CM | POA: Diagnosis not present

## 2019-01-17 DIAGNOSIS — K5900 Constipation, unspecified: Secondary | ICD-10-CM | POA: Insufficient documentation

## 2019-01-17 DIAGNOSIS — C9200 Acute myeloblastic leukemia, not having achieved remission: Secondary | ICD-10-CM | POA: Insufficient documentation

## 2019-01-17 DIAGNOSIS — Z452 Encounter for adjustment and management of vascular access device: Secondary | ICD-10-CM

## 2019-01-17 DIAGNOSIS — Z79899 Other long term (current) drug therapy: Secondary | ICD-10-CM | POA: Diagnosis not present

## 2019-01-17 DIAGNOSIS — Z95828 Presence of other vascular implants and grafts: Secondary | ICD-10-CM

## 2019-01-17 LAB — CBC WITH DIFFERENTIAL/PLATELET
Abs Immature Granulocytes: 0.82 10*3/uL — ABNORMAL HIGH (ref 0.00–0.07)
Basophils Absolute: 0 10*3/uL (ref 0.0–0.1)
Basophils Relative: 0 %
Eosinophils Absolute: 0 10*3/uL (ref 0.0–0.5)
Eosinophils Relative: 0 %
HCT: 30.4 % — ABNORMAL LOW (ref 36.0–46.0)
Hemoglobin: 9.6 g/dL — ABNORMAL LOW (ref 12.0–15.0)
Immature Granulocytes: 8 %
Lymphocytes Relative: 16 %
Lymphs Abs: 1.6 10*3/uL (ref 0.7–4.0)
MCH: 24.6 pg — ABNORMAL LOW (ref 26.0–34.0)
MCHC: 31.6 g/dL (ref 30.0–36.0)
MCV: 77.9 fL — ABNORMAL LOW (ref 80.0–100.0)
Monocytes Absolute: 2 10*3/uL — ABNORMAL HIGH (ref 0.1–1.0)
Monocytes Relative: 19 %
Neutro Abs: 5.8 10*3/uL (ref 1.7–7.7)
Neutrophils Relative %: 57 %
Platelets: 26 10*3/uL — CL (ref 150–400)
RBC: 3.9 MIL/uL (ref 3.87–5.11)
RDW: 21.9 % — ABNORMAL HIGH (ref 11.5–15.5)
WBC: 10.3 10*3/uL (ref 4.0–10.5)
nRBC: 2 % — ABNORMAL HIGH (ref 0.0–0.2)

## 2019-01-17 LAB — COMPREHENSIVE METABOLIC PANEL
ALT: 18 U/L (ref 0–44)
AST: 30 U/L (ref 15–41)
Albumin: 3.5 g/dL (ref 3.5–5.0)
Alkaline Phosphatase: 99 U/L (ref 38–126)
Anion gap: 7 (ref 5–15)
BUN: 18 mg/dL (ref 8–23)
CO2: 29 mmol/L (ref 22–32)
Calcium: 9.5 mg/dL (ref 8.9–10.3)
Chloride: 98 mmol/L (ref 98–111)
Creatinine, Ser: 0.76 mg/dL (ref 0.44–1.00)
GFR calc Af Amer: >60 mL/min (ref >60)
GFR calc non Af Amer: >60 mL/min (ref >60)
Glucose, Bld: 144 mg/dL — ABNORMAL HIGH (ref 70–99)
Potassium: 4.1 mmol/L (ref 3.5–5.1)
Sodium: 134 mmol/L — ABNORMAL LOW (ref 135–145)
Total Bilirubin: 0.9 mg/dL (ref 0.3–1.2)
Total Protein: 6.9 g/dL (ref 6.5–8.1)

## 2019-01-17 LAB — SAMPLE TO BLOOD BANK

## 2019-01-17 LAB — LACTATE DEHYDROGENASE: LDH: 410 U/L — ABNORMAL HIGH (ref 98–192)

## 2019-01-17 MED ORDER — SODIUM CHLORIDE 0.9% FLUSH
10.0000 mL | Freq: Once | INTRAVENOUS | Status: AC
  Administered 2019-01-17: 14:00:00 10 mL via INTRAVENOUS
  Filled 2019-01-17: qty 10

## 2019-01-17 MED ORDER — HEPARIN SOD (PORK) LOCK FLUSH 100 UNIT/ML IV SOLN
500.0000 [IU] | Freq: Once | INTRAVENOUS | Status: AC
  Administered 2019-01-17: 250 [IU] via INTRAVENOUS

## 2019-01-17 NOTE — Progress Notes (Signed)
Critical plt count 26. Read back process performed with Lonnie-lab tech. Dr. Rogue Bussing - made aware at 1424. Read back process performed with md. Pt only drinking boost/ensures daily and bites of crackers.

## 2019-01-17 NOTE — Assessment & Plan Note (Addendum)
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 Currently on cycle #1- Vidaza + Venatoclax.  Day #28 bone marrow biopsy-  7/14- BMx- NO BLASTS; positive for dysplastic changes. Currently off Venatoclax [since 7/17] sec to cytopenias./And also status post fall intracranial bleed [see below]  #Today labs show-white count of 10/hemoglobin of 9.5 platelets 26.  Patient is off venetoclax for the last 1 month.  I think these counts are reflective of patient's bone marrow function.   #Status post mechanical fall intracranial bleed-minor bleed; status post evaluation at [facility] neurosurgery.  Conservative measures no surgical options.   # Supportive care:  # Continue Levaquin/Noxafil/acyclovir # PICC dressing weekly.   # weight loss: Status post nutrition evaluation stable  #Discussed with patient's husband-at length regarding patient's plan of care/labs etc. -------------------------------------------------------------------------------- # DISPOSITION: print labs   # NO transfusions/ No nivestym today # Follow up with MD- 8/17 cbc/cmp/LDH- HOLD tube/ Vidaza [days 1-5]- Dr.B

## 2019-01-17 NOTE — Progress Notes (Signed)
[facility] CONSULT NOTE  Patient Care Team: Rusty Aus, MD as PCP - General (Internal Medicine)  CHIEF COMPLAINTS/PURPOSE OF CONSULTATION:    Oncology History Overview Note  # SEP 2017- MYELOPROLIFERATIVE NEOPLASM- [WBC- 84; normal Hb/platelets] hypercellular bone marrow 90-95% proliferation of myeloid cells in various stages of maturation; relative erythroid hypoplasia and proliferation of atypical megakaryocytes; no increase in blasts; peripheral blood Bcr-Abl-NEG; Cytogenetics- WNL. NEG- Jak-2/MPL/CALR [place] limited- mild splenomegaly [~10cm; 463cm3]; OCT 2017- second opinion at [facility]. Surveillance.   # With progressive leukocytosis we evaluated her a month ago and sent BM for exam. She has a progressive leukocytosis so hydrea 552m daily was started on 10/20/2016 and increased to 2gm daily then back down to one daily, then 5 days per week over time due to counts drop. Then we held her hydrea since 04/09/2018 due to continued declining Hb and Plt.s  BMB 06/24/18 showed markedly hypercellular BM 95% with myeloid hyperplasia and atypical megakaryocytic hyperplasia. No significant increase in blasts. 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 BCR / ABL negative, FISH normal. Flow Showed 2%CD34-positive myeloid blasts. Myeloid precursors with low side scatter. Pathogenic variants were detected in the ASXL1, CCND2, CUX1, and U2AF1 genes.  # March 2020- wbc- 14/ Hb 9.5/ platelets- 54.   ---------------------------------------------------   # June 8th 2020- Acute myeloid leukemia [peripheral blood flow cytometry;NGS- pending]- June 15th Vidaza [SQ 1-5 + Venatoclax- #1in pt]; tumor lysis prophylaxis  # Day #28 bone marrow biopsy-  7/14- BMx- NO BLASTS; positive for dysplastic changes. Currently off Venatoclax [since 7/17] sec to cytopenias.  # aug 3rd 2020-    # 2002 [[place]] BREAST CA s/p Lumpect RT; [? Stage I] no  chemo s/p AI  # F-ONE HEM: FLT3 ALTERATION NOTED  DIAGNOSIS: ACUTE MYELOID LEUKEMIA GOALS: pallaitive  CURRENT/MOST RECENT THERAPY : Vidaza+ venotoclax.      MDS (myelodysplastic syndrome) ([facility]  08/28/2018 Initial Diagnosis   MDS (myelodysplastic syndrome) (HCC)   AML (acute myeloid leukemia) with failed remission ([facility]  11/25/2018 Initial Diagnosis   AML (acute [place]) with failed remission ([facility]   11/25/2018 - 12/22/2018 Chemotherapy   The patient had azaCITIDine (VIDAZA) chemo injection 105 mg, 75 mg/m2 = 105 mg, Subcutaneous,  Once, 1 of 4 cycles Administration: 105 mg (11/25/2018), 105 mg (11/26/2018), 105 mg (11/27/2018), 105 mg (11/28/2018), 105 mg (11/29/2018)  for chemotherapy treatment.    01/27/2019 -  Chemotherapy   The patient had palonosetron (ALOXI) injection 0.25 mg, 0.25 mg, Intravenous,  Once, 0 of 4 cycles azaCITIDine (VIDAZA) 100 mg in sodium chloride 0.9 % 50 mL chemo infusion, 75 mg/m2, Intravenous, Once, 0 of 4 cycles  for chemotherapy treatment.       HISTORY OF PRESENTING ILLNESS:  Kathryn Hahn 85y.o.  female with acute myeloid leukemia with monocytic differentiation currently on Vidaza plus venetoclax is here for follow-up.  Patient's therapy has been on hold since July 17 because of severe cytopenias attributed to venetoclax.  Patient has been needing intermittent growth factor support.  However of note patient was admitted to hospital about 10 days ago because of mechanical fall.  Patient slipped and fell at home.  She hit her head had a intracranial bleed.  Evaluated in the hospital/[facility] neurosurgery-recommended conservative measures.   Patient currently denies any bleeding gums or spontaneous bruising.  Denies any headaches but denies mental status changes.  No more falls.   Review of Systems  Constitutional: Positive for malaise/fatigue. Negative for  chills, diaphoresis, fever and weight loss.  HENT: Negative for nosebleeds  and sore throat.   Eyes: Negative for double vision.  Respiratory: Negative for cough, hemoptysis, sputum production and wheezing.   Cardiovascular: Negative for chest pain, palpitations and orthopnea.  Gastrointestinal: Positive for constipation. Negative for abdominal pain, blood in stool, diarrhea, heartburn, melena, nausea and vomiting.  Genitourinary: Negative for dysuria, frequency and urgency.  Musculoskeletal: Negative for back pain and joint pain.  Skin: Negative.  Negative for itching and rash.  Neurological: Negative for dizziness, tingling, focal weakness, weakness and headaches.  Psychiatric/Behavioral: Negative for depression. The patient is not nervous/anxious and does not have insomnia.     MEDICAL HISTORY:  Past Medical History:  Diagnosis Date  . Anemia   . Arthritis   . Breast cancer ([place]) 2003   RT LUMPECTOMY  . Edema leg   . History of breast cancer 2003   post lumpectomy  . History of kidney stones   . Hyperlipemia, mixed   . Hypertension, essential   . Leukemia ([place])   . Leukocytosis   . MDS (myelodysplastic syndrome) ([place])   . Personal history of radiation therapy 2003   BREAST CA  . Renal stones   . Vitamin D deficiency     SURGICAL HISTORY: Past Surgical History:  Procedure Laterality Date  . BREAST BIOPSY Right 2003   Postive for cancer  . BREAST LUMPECTOMY Right 2003   BREAST CA  . KIDNEY STONE SURGERY Right     SOCIAL HISTORY: Social History   Socioeconomic History  . Marital status: Married    Spouse name: Not on file  . Number of children: Not on file  . Years of education: Not on file  . Highest education level: Not on file  Occupational History  . Not on file  Social Needs  . Financial resource strain: Not on file  . Food insecurity    Worry: Not on file    Inability: Not on file  . Transportation needs    Medical: Not on file    Non-medical: Not on file  Tobacco Use  . Smoking status: Never Smoker  . Smokeless tobacco:  Never Used  Substance and Sexual Activity  . Alcohol use: Yes    Alcohol/week: 3.0 standard drinks    Types: 3 Glasses of wine per week  . Drug use: No  . Sexual activity: Not on file  Lifestyle  . Physical activity    Days per week: Not on file    Minutes per session: Not on file  . Stress: Not on file  Relationships  . Social Herbalist on phone: Not on file    Gets together: Not on file    Attends religious service: Not on file    Active member of club or organization: Not on file    Attends meetings of clubs or organizations: Not on file    Relationship status: Not on file  . Intimate partner violence    Fear of current or ex partner: Not on file    Emotionally abused: Not on file    Physically abused: Not on file    Forced sexual activity: Not on file  Other Topics Concern  . Not on file  Social History Narrative    No smoking/alcohol; lives in [place] with family.  She lives in assisted living with her husband.    FAMILY HISTORY: Family History  Problem Relation Age of Onset  . Stroke Mother   .  Hypertension Father   . Stroke Father   . Breast cancer Neg Hx     ALLERGIES:  is allergic to terbinafine.  MEDICATIONS:  Current Outpatient Medications  Medication Sig Dispense Refill  . acyclovir (ZOVIRAX) 400 MG tablet One pill a day [to prevent shingles] 30 tablet 3  . atenolol (TENORMIN) 50 MG tablet Take 1 tablet by mouth 2 (two) times daily.    . calcium carbonate (OSCAL) 1500 (600 Ca) MG TABS tablet Take 600 mg of elemental calcium by mouth daily with breakfast.    . Cholecalciferol (VITAMIN D) 2000 units tablet Take 1 tablet by mouth daily.    Marland Kitchen levETIRAcetam (KEPPRA) 250 MG tablet Take 1 tablet (250 mg total) by mouth every morning. 2 tablet 0  . levofloxacin (LEVAQUIN) 250 MG tablet Take 1 tablet (250 mg total) by mouth daily. 30 tablet 3  . losartan-hydrochlorothiazide (HYZAAR) 50-12.5 MG tablet Take 1 tablet by mouth daily.    . magic  mouthwash w/lidocaine SOLN Take 5 mLs by mouth 4 (four) times daily as needed for mouth pain. 480 mL 3  . oxycodone (OXY-IR) 5 MG capsule Take 1 capsule (5 mg total) by mouth every 4 (four) hours as needed. 15 capsule 0  . posaconazole (NOXAFIL) 100 MG TBEC delayed-release tablet Take 2 tablets (200 mg total) by mouth daily. DO NOT START UNTIL-OK with MD. 60 tablet 3  . potassium chloride SA (KLOR-CON M20) 20 MEQ tablet TAKE 1 TABLET BY MOUTH TWICE A DAY (Patient taking differently: Take 20 mEq by mouth daily. ) 30 tablet 3   No current facility-administered medications for this visit.       Marland Kitchen  PHYSICAL EXAMINATION: ECOG PERFORMANCE STATUS: 0 - Asymptomatic  Vitals:   01/17/19 1414  BP: 130/83  Pulse: 92  Resp: 18  Temp: 98 F (36.7 C)   Filed Weights   01/17/19 1414  Weight: 95 lb 11.2 oz (43.4 kg)    Physical Exam  Constitutional: She is oriented to person, place, and time and well-developed, well-nourished, and in no distress.  HENT:  Head: Normocephalic and atraumatic.  Mouth/Throat: Oropharynx is clear and moist. No oropharyngeal exudate.  Eyes: Pupils are equal, round, and reactive to light.  Neck: Normal range of motion. Neck supple.  Cardiovascular: Normal rate and regular rhythm.  Pulmonary/Chest: No respiratory distress. She has no wheezes.  Abdominal: Soft. Bowel sounds are normal. She exhibits no distension and no mass. There is no abdominal tenderness. There is no rebound and no guarding.  Musculoskeletal: Normal range of motion.        General: Edema present. No tenderness.  Neurological: She is alert and oriented to person, place, and time.  Skin: Skin is warm.  Multiple bruises noted.  Positive for mild bleeding around the site of the PICC line insertion.  Multiple abdominal wall bruises noted.  Psychiatric: Affect normal.  ;   LABORATORY DATA:  I have reviewed the data as listed Lab Results  Component Value Date   WBC 8.6 01/23/2019   HGB 8.9 (L)  01/23/2019   HCT 27.8 (L) 01/23/2019   MCV 77.9 (L) 01/23/2019   PLT 21 (LL) 01/23/2019   Recent Labs    01/03/19 0858 01/10/19 0808 01/14/19 0420 01/17/19 1408  NA 135 137 136 134*  K 3.7 3.5 3.0* 4.1  CL 100 103 98 98  CO2 _0 GLUCOSE 152* 137* 123* 144*  BUN _1 CREATININE 0.59 0.54 0.56 0.76  CALCIUM 8.9 8.7* 8.7* 9.5  GFRNONAA >60 >60 >60 >60  GFRAA >60 >60 >60 >60  PROT 7.0 6.5  --  6.9  ALBUMIN 3.6 3.3*  --  3.5  AST 19 29  --  30  ALT 12 16  --  18  ALKPHOS 106 89  --  99  BILITOT 0.6 0.8  --  0.9    RADIOGRAPHIC STUDIES: I have personally reviewed the radiological images as listed and agreed with the findings in the report. Dg Chest 1 View  Result Date: 01/14/2019 CLINICAL DATA:  Hypoxia. EXAM: CHEST  1 VIEW COMPARISON:  01/13/2019. FINDINGS: Left PICC line in stable position. Stable cardiomegaly. Dense bibasilar atelectasis/infiltrates again noted. Small bilateral pleural effusions. Prominent deformity of the right chest with multiple right rib fractures again noted. Associated right upper pleural thickening is noted. No pneumothorax. Proximal right humeral fracture. Surgical clips right chest. IMPRESSION: 1.  Left PICC line stable position 2.  Stable cardiomegaly. 3. Dense bibasilar atelectasis/infiltrates again noted. Small bilateral pleural effusions again noted. 4. Severe deformity of the right chest with multiple right rib fractures again noted. Associated right upper lateral pleural thickening is noted. No pneumothorax. 5.  Proximal right humeral fracture. Electronically Signed   By: Marcello Moores  Register   On: 01/14/2019 05:36   Dg Chest 1 View  Result Date: 01/13/2019 CLINICAL DATA:  Recent history of fall. Hypoxia. History of breast cancer. History of leukemia. EXAM: CHEST  1 VIEW COMPARISON:  01/10/2019. FINDINGS: PICC line stable position. Mediastinum and hilar structures are stable. Stable cardiomegaly. Dense bibasilar atelectasis/infiltrate noted  on today's exam. Small bilateral pleural effusions. Deformity noted of the right chest with multiple right rib fractures again noted. Surgical clips right chest. IMPRESSION: 1.  PICC line in stable position. 2.  Stable cardiomegaly. 3. Dense bibasilar atelectasis/infiltrates noted on today's exam. Small bilateral pleural effusions noted. 4. Deformity noted of the right chest with multiple right rib fractures again noted. No pneumothorax. Electronically Signed   By: Marcello Moores  Register   On: 01/13/2019 12:36   Dg Chest 2 View  Result Date: 01/10/2019 CLINICAL DATA:  RIGHT chest pain following fall.  Initial encounter. EXAM: CHEST - 2 VIEW COMPARISON:  None. FINDINGS: Cardiomegaly noted. A LEFT PICC line with tip overlying the LOWER SVC noted. Mild interstitial opacities bilaterally are identified. Trace pleural effusions are noted. No focal airspace disease or pneumothorax. Fractures of the RIGHT 5th through 8th ribs noted. Mildly impacted RIGHT humeral neck fracture extending into the head is identified. RIGHT breast/axillary surgical clips noted. IMPRESSION: 1. RIGHT 5th through 8th rib fractures. No evidence of pneumothorax. 2. RIGHT humeral neck/head fracture. 3. Cardiomegaly with mild interstitial opacities of uncertain chronicity. With trace bilateral pleural effusions, these interstitial opacities may represent mild interstitial edema. Electronically Signed   By: Margarette Canada M.D.   On: 01/10/2019 08:43   Dg Shoulder Right  Result Date: 01/10/2019 CLINICAL DATA:  Acute RIGHT shoulder pain following fall. Initial encounter. EXAM: RIGHT SHOULDER - 2+ VIEW COMPARISON:  None. FINDINGS: A mildly impacted humeral neck fracture is noted extending into the head and involving the greater tuberosity. No shoulder dislocation. Fractures of the RIGHT 5th through 8th ribs noted.  No pneumothorax. IMPRESSION: 1. Mildly impacted humeral neck fracture extending into the head and greater tuberosity. 2. RIGHT 5th through 8th  rib fractures.  No pneumothorax. Electronically Signed   By: Margarette Canada M.D.   On: 01/10/2019 08:46   Ct Head Wo Contrast  Result Date:  01/10/2019 CLINICAL DATA:  Per neurosurgery repeat CT scan of the head in 6 hours. Pt ems from [facility] s/p fall mechanical pt c/o of rt shoulder pain. EXAM: CT HEAD WITHOUT CONTRAST TECHNIQUE: Contiguous axial images were obtained from the base of the skull through the vertex without intravenous contrast. COMPARISON:  01/10/2019 at 8:30 a.m. FINDINGS: Brain: No change in the small focus of hemorrhage noted along the anterior right frontal lobe. No new intracranial hemorrhage. No hydrocephalus or evidence of an ischemic infarct. Atrophy and mild chronic microvascular ischemic change is stable from the earlier exam. Vascular: No hyperdense vessel or unexpected calcification. Skull: Normal. Negative for fracture or focal lesion. Sinuses/Orbits: No acute finding. Other: None. IMPRESSION: 1. No change from the study obtained earlier today. 2. Trace posttraumatic hemorrhage along the anterior right frontal lobe. No new hemorrhage. Electronically Signed   By: Lajean Manes M.D.   On: 01/10/2019 15:30   Ct Head Wo Contrast  Addendum Date: 01/10/2019   ADDENDUM REPORT: 01/10/2019 09:31 ADDENDUM: Study discussed by telephone with Dr. Conni Slipper on 01/10/2019 at 0921 hours. Electronically Signed   By: Genevie Ann M.D.   On: 01/10/2019 09:31   Result Date: 01/10/2019 CLINICAL DATA:  85 year old female status post fall this morning. Right orbits/frontal injury. Neck pain. EXAM: CT HEAD WITHOUT CONTRAST CT CERVICAL SPINE WITHOUT CONTRAST TECHNIQUE: Multidetector CT imaging of the head and cervical spine was performed following the standard protocol without intravenous contrast. Multiplanar CT image reconstructions of the cervical spine were also generated. COMPARISON:  CTA head 01/10/2017. CT face 01/09/2017. FINDINGS: CT HEAD FINDINGS Brain: Stable cerebral volume, normal  for age. No midline shift, mass effect, or evidence of intracranial mass lesion. No ventriculomegaly. No intraventricular hemorrhage identified. Trace acute hemorrhage along the right anterior frontal convexity on series 2, image 13 appears to be subarachnoid rather than a hemorrhagic cerebral contusion. No other acute intracranial hemorrhage identified. Stable gray-white matter differentiation throughout the brain. No cortically based acute infarct identified. No cortical encephalomalacia. Normal basilar cisterns. Vascular: Calcified atherosclerosis at the skull base. No suspicious intracranial vascular hyperdensity. Skull: Intact. Sinuses/Orbits: Trace to mild paranasal sinus mucosal thickening is new since 2018. Tympanic cavities and mastoids remain clear. Other: Right posterior convexity scalp hematoma measuring up to 4 millimeters in thickness. Underlying calvarium intact. Stable and negative other orbit and scalp soft tissues. CT CERVICAL SPINE FINDINGS Alignment: Reversal of upper cervical lordosis is chronic and appears stable since 2018. Bilateral posterior element alignment is within normal limits. Cervicothoracic junction alignment is within normal limits. Skull base and vertebrae: Visualized skull base is intact. No atlanto-occipital dissociation. Congenital incomplete ossification of the posterior C1 ring. No acute osseous abnormality identified. Soft tissues and spinal canal: No prevertebral fluid or swelling. No visible canal hematoma. Disc levels: Widespread advanced cervical spine disc and endplate degeneration. Progressed left facet degeneration at C2-C3 since 2018. Mild if any associated cervical spinal stenosis. Upper chest: Grossly intact visible upper thoracic levels. Negative lung apices. IMPRESSION: 1. Positive for trace posttraumatic hemorrhage along the right anterior frontal lobe, favor trace subarachnoid over small hemorrhagic contusion. 2. No intracranial mass effect and no other acute  traumatic injury to the brain identified. 3. Right posterior convexity scalp hematoma without underlying fracture. 4. No acute traumatic injury identified in the cervical spine. Electronically Signed: By: Genevie Ann M.D. On: 01/10/2019 09:09   Ct Cervical Spine Wo Contrast  Addendum Date: 01/10/2019   ADDENDUM REPORT: 01/10/2019 09:31 ADDENDUM: Study discussed by telephone with  Dr. Conni Slipper on 01/10/2019 at 0921 hours. Electronically Signed   By: Genevie Ann M.D.   On: 01/10/2019 09:31   Result Date: 01/10/2019 CLINICAL DATA:  85 year old female status post fall this morning. Right orbits/frontal injury. Neck pain. EXAM: CT HEAD WITHOUT CONTRAST CT CERVICAL SPINE WITHOUT CONTRAST TECHNIQUE: Multidetector CT imaging of the head and cervical spine was performed following the standard protocol without intravenous contrast. Multiplanar CT image reconstructions of the cervical spine were also generated. COMPARISON:  CTA head 01/10/2017. CT face 01/09/2017. FINDINGS: CT HEAD FINDINGS Brain: Stable cerebral volume, normal for age. No midline shift, mass effect, or evidence of intracranial mass lesion. No ventriculomegaly. No intraventricular hemorrhage identified. Trace acute hemorrhage along the right anterior frontal convexity on series 2, image 13 appears to be subarachnoid rather than a hemorrhagic cerebral contusion. No other acute intracranial hemorrhage identified. Stable gray-white matter differentiation throughout the brain. No cortically based acute infarct identified. No cortical encephalomalacia. Normal basilar cisterns. Vascular: Calcified atherosclerosis at the skull base. No suspicious intracranial vascular hyperdensity. Skull: Intact. Sinuses/Orbits: Trace to mild paranasal sinus mucosal thickening is new since 2018. Tympanic cavities and mastoids remain clear. Other: Right posterior convexity scalp hematoma measuring up to 4 millimeters in thickness. Underlying calvarium intact. Stable and negative other  orbit and scalp soft tissues. CT CERVICAL SPINE FINDINGS Alignment: Reversal of upper cervical lordosis is chronic and appears stable since 2018. Bilateral posterior element alignment is within normal limits. Cervicothoracic junction alignment is within normal limits. Skull base and vertebrae: Visualized skull base is intact. No atlanto-occipital dissociation. Congenital incomplete ossification of the posterior C1 ring. No acute osseous abnormality identified. Soft tissues and spinal canal: No prevertebral fluid or swelling. No visible canal hematoma. Disc levels: Widespread advanced cervical spine disc and endplate degeneration. Progressed left facet degeneration at C2-C3 since 2018. Mild if any associated cervical spinal stenosis. Upper chest: Grossly intact visible upper thoracic levels. Negative lung apices. IMPRESSION: 1. Positive for trace posttraumatic hemorrhage along the right anterior frontal lobe, favor trace subarachnoid over small hemorrhagic contusion. 2. No intracranial mass effect and no other acute traumatic injury to the brain identified. 3. Right posterior convexity scalp hematoma without underlying fracture. 4. No acute traumatic injury identified in the cervical spine. Electronically Signed: By: Genevie Ann M.D. On: 01/10/2019 09:09   Ct Shoulder Right Wo Contrast  Result Date: 01/10/2019 CLINICAL DATA:  Right shoulder pain after a fall today. Initial encounter. EXAM: CT OF THE UPPER RIGHT EXTREMITY WITHOUT CONTRAST TECHNIQUE: Multidetector CT imaging of the upper right extremity was performed according to the standard protocol. COMPARISON:  Plain films right shoulder earlier today. FINDINGS: Bones/Joint/Cartilage As seen on the comparison study, the patient has a transverse fracture through the metaphysis of the humerus. The articular surface of the humeral head is rotated medially due to retraction by the rotator cuff. There is mild anterior displacement of the fracture. Nondisplaced component  extends into the anterior aspect of the greater tuberosity. The lesser tuberosity is spared. The humeral head is located and the acromioclavicular joint is intact. Also seen are acute fractures of the posterior arcs of the right fifth, sixth, seventh and eighth ribs. No other fracture is identified. Ligaments Suboptimally assessed by CT. Muscles and Tendons Visualization of the rotator cuff is somewhat limited on CT but no fracture seen. Soft tissues There is some hemorrhage in the subacromial/subdeltoid bursa due to the patient's fracture. Small right pleural effusion is noted. No pneumothorax is identified but the anterior aspect of  the right lung is seen only near the apex. Imaged lung parenchyma is clear with the exception of mild dependent atelectasis. IMPRESSION: Transverse fracture through the metaphysis of the humerus with mild impaction includes a nondisplaced component in the anterior aspect of the greater tuberosity. The humeral head is rotated medially due to retraction by the rotator cuff. Acute right fifth through eighth rib fractures with an associated small pleural effusion. No pneumothorax is seen but the anterior margin of the lung is only seen at the apex. Hemorrhage in the subacromial/subdeltoid bursa due to the patient's fracture. Electronically Signed   By: Inge Rise M.D.   On: 01/10/2019 15:34    ASSESSMENT & PLAN:   AML (acute myeloid leukemia) with failed remission (HCC) #Acute myeloid leukemia with monocytic differentiation [20% blasts on peripheral blood flow cytometry]. Currently on cycle #1- Vidaza + Venatoclax.  Day #28 bone marrow biopsy-  7/14- BMx- NO BLASTS; positive for dysplastic changes. Currently off Venatoclax [since 7/17] sec to cytopenias./And also status post fall intracranial bleed [see below]  #Today labs show-white count of 10/hemoglobin of 9.5 platelets 26.  Patient is off venetoclax for the last 1 month.  I think these counts are reflective of patient's  bone marrow function.   #Status post mechanical fall intracranial bleed-minor bleed; status post evaluation at [facility].  Conservative measures no surgical options.   # Supportive care:  # Continue Levaquin/Noxafil/acyclovir # PICC dressing weekly.   # weight loss: Status post nutrition evaluation stable  #Discussed with patient's husband-at length regarding patient's plan of care/labs etc. -------------------------------------------------------------------------------- # DISPOSITION: print labs   # NO transfusions/ No nivestym today # Follow up with MD- 8/17 cbc/cmp/LD[facility]- HOLD tube/ Vidaza [JGOT 1-5]- Dr.B    Cammie Sickle, MD 01/27/2019 8:46 AM

## 2019-01-22 ENCOUNTER — Telehealth: Payer: Self-pay | Admitting: *Deleted

## 2019-01-22 DIAGNOSIS — C9200 Acute myeloblastic leukemia, not having achieved remission: Secondary | ICD-10-CM

## 2019-01-22 NOTE — Telephone Encounter (Signed)
Mr Baer called asking if Dr B is OK with Dniya not having her labs checked for 10 days. Please advise

## 2019-01-22 NOTE — Telephone Encounter (Signed)
[name]- Please order CBC /hold tube/ possible nivestim tomorrow 8/13.

## 2019-01-23 ENCOUNTER — Other Ambulatory Visit: Payer: Self-pay

## 2019-01-23 ENCOUNTER — Inpatient Hospital Stay: Payer: Medicare Other | Admitting: *Deleted

## 2019-01-23 ENCOUNTER — Inpatient Hospital Stay: Payer: Medicare Other

## 2019-01-23 DIAGNOSIS — Z452 Encounter for adjustment and management of vascular access device: Secondary | ICD-10-CM

## 2019-01-23 DIAGNOSIS — C9200 Acute myeloblastic leukemia, not having achieved remission: Secondary | ICD-10-CM

## 2019-01-23 DIAGNOSIS — Z5111 Encounter for antineoplastic chemotherapy: Secondary | ICD-10-CM | POA: Diagnosis not present

## 2019-01-23 LAB — CBC WITH DIFFERENTIAL/PLATELET
Abs Immature Granulocytes: 0.51 10*3/uL — ABNORMAL HIGH (ref 0.00–0.07)
Basophils Absolute: 0 10*3/uL (ref 0.0–0.1)
Basophils Relative: 0 %
Eosinophils Absolute: 0 10*3/uL (ref 0.0–0.5)
Eosinophils Relative: 0 %
HCT: 27.8 % — ABNORMAL LOW (ref 36.0–46.0)
Hemoglobin: 8.9 g/dL — ABNORMAL LOW (ref 12.0–15.0)
Immature Granulocytes: 6 %
Lymphocytes Relative: 20 %
Lymphs Abs: 1.7 10*3/uL (ref 0.7–4.0)
MCH: 24.9 pg — ABNORMAL LOW (ref 26.0–34.0)
MCHC: 32 g/dL (ref 30.0–36.0)
MCV: 77.9 fL — ABNORMAL LOW (ref 80.0–100.0)
Monocytes Absolute: 2.3 10*3/uL — ABNORMAL HIGH (ref 0.1–1.0)
Monocytes Relative: 27 %
Neutro Abs: 4 10*3/uL (ref 1.7–7.7)
Neutrophils Relative %: 47 %
Platelets: 21 10*3/uL — CL (ref 150–400)
RBC: 3.57 MIL/uL — ABNORMAL LOW (ref 3.87–5.11)
RDW: 22.8 % — ABNORMAL HIGH (ref 11.5–15.5)
WBC: 8.6 10*3/uL (ref 4.0–10.5)
nRBC: 1.6 % — ABNORMAL HIGH (ref 0.0–0.2)

## 2019-01-23 LAB — SAMPLE TO BLOOD BANK

## 2019-01-23 MED ORDER — SODIUM CHLORIDE 0.9% FLUSH
10.0000 mL | Freq: Once | INTRAVENOUS | Status: AC
  Administered 2019-01-23: 10 mL via INTRAVENOUS
  Filled 2019-01-23: qty 10

## 2019-01-23 MED ORDER — HEPARIN SOD (PORK) LOCK FLUSH 100 UNIT/ML IV SOLN
500.0000 [IU] | Freq: Once | INTRAVENOUS | Status: AC
  Administered 2019-01-23: 500 [IU] via INTRAVENOUS

## 2019-01-24 DIAGNOSIS — S42201A Unspecified fracture of upper end of right humerus, initial encounter for closed fracture: Secondary | ICD-10-CM

## 2019-01-27 ENCOUNTER — Telehealth: Payer: Self-pay | Admitting: Pharmacist

## 2019-01-27 ENCOUNTER — Inpatient Hospital Stay: Payer: Medicare Other

## 2019-01-27 ENCOUNTER — Other Ambulatory Visit: Payer: Self-pay

## 2019-01-27 ENCOUNTER — Encounter: Payer: Self-pay | Admitting: Internal Medicine

## 2019-01-27 ENCOUNTER — Inpatient Hospital Stay (HOSPITAL_BASED_OUTPATIENT_CLINIC_OR_DEPARTMENT_OTHER): Payer: Medicare Other | Admitting: Internal Medicine

## 2019-01-27 DIAGNOSIS — C9200 Acute myeloblastic leukemia, not having achieved remission: Secondary | ICD-10-CM

## 2019-01-27 DIAGNOSIS — Z7189 Other specified counseling: Secondary | ICD-10-CM

## 2019-01-27 DIAGNOSIS — Z5111 Encounter for antineoplastic chemotherapy: Secondary | ICD-10-CM | POA: Diagnosis not present

## 2019-01-27 LAB — CBC WITH DIFFERENTIAL/PLATELET
Abs Immature Granulocytes: 0.51 10*3/uL — ABNORMAL HIGH (ref 0.00–0.07)
Basophils Absolute: 0 10*3/uL (ref 0.0–0.1)
Basophils Relative: 0 %
Eosinophils Absolute: 0 10*3/uL (ref 0.0–0.5)
Eosinophils Relative: 0 %
HCT: 26.3 % — ABNORMAL LOW (ref 36.0–46.0)
Hemoglobin: 8.4 g/dL — ABNORMAL LOW (ref 12.0–15.0)
Immature Granulocytes: 5 %
Lymphocytes Relative: 16 %
Lymphs Abs: 1.6 10*3/uL (ref 0.7–4.0)
MCH: 24.8 pg — ABNORMAL LOW (ref 26.0–34.0)
MCHC: 31.9 g/dL (ref 30.0–36.0)
MCV: 77.6 fL — ABNORMAL LOW (ref 80.0–100.0)
Monocytes Absolute: 4.2 10*3/uL — ABNORMAL HIGH (ref 0.1–1.0)
Monocytes Relative: 41 %
Neutro Abs: 3.9 10*3/uL (ref 1.7–7.7)
Neutrophils Relative %: 38 %
Platelets: 21 10*3/uL — CL (ref 150–400)
RBC: 3.39 MIL/uL — ABNORMAL LOW (ref 3.87–5.11)
RDW: 22.8 % — ABNORMAL HIGH (ref 11.5–15.5)
WBC: 10.2 10*3/uL (ref 4.0–10.5)
nRBC: 1.5 % — ABNORMAL HIGH (ref 0.0–0.2)

## 2019-01-27 LAB — COMPREHENSIVE METABOLIC PANEL
ALT: 14 U/L (ref 0–44)
AST: 31 U/L (ref 15–41)
Albumin: 3.6 g/dL (ref 3.5–5.0)
Alkaline Phosphatase: 122 U/L (ref 38–126)
Anion gap: 9 (ref 5–15)
BUN: 24 mg/dL — ABNORMAL HIGH (ref 8–23)
CO2: 28 mmol/L (ref 22–32)
Calcium: 9.3 mg/dL (ref 8.9–10.3)
Chloride: 98 mmol/L (ref 98–111)
Creatinine, Ser: 0.58 mg/dL (ref 0.44–1.00)
GFR calc Af Amer: >60 mL/min (ref >60)
GFR calc non Af Amer: >60 mL/min (ref >60)
Glucose, Bld: 128 mg/dL — ABNORMAL HIGH (ref 70–99)
Potassium: 3.5 mmol/L (ref 3.5–5.1)
Sodium: 135 mmol/L (ref 135–145)
Total Bilirubin: 0.6 mg/dL (ref 0.3–1.2)
Total Protein: 6.7 g/dL (ref 6.5–8.1)

## 2019-01-27 LAB — SAMPLE TO BLOOD BANK

## 2019-01-27 LAB — LACTATE DEHYDROGENASE: LDH: 492 U/L — ABNORMAL HIGH (ref 98–192)

## 2019-01-27 MED ORDER — SODIUM CHLORIDE 0.9 % IV SOLN
Freq: Once | INTRAVENOUS | Status: AC
  Administered 2019-01-27: 12:00:00 via INTRAVENOUS
  Filled 2019-01-27: qty 250

## 2019-01-27 MED ORDER — PALONOSETRON HCL INJECTION 0.25 MG/5ML
0.2500 mg | Freq: Once | INTRAVENOUS | Status: AC
  Administered 2019-01-27: 0.25 mg via INTRAVENOUS
  Filled 2019-01-27: qty 5

## 2019-01-27 MED ORDER — SODIUM CHLORIDE 0.9% FLUSH
10.0000 mL | INTRAVENOUS | Status: AC | PRN
  Administered 2019-01-27: 10 mL
  Filled 2019-01-27: qty 10

## 2019-01-27 MED ORDER — DEXAMETHASONE SODIUM PHOSPHATE 10 MG/ML IJ SOLN
10.0000 mg | Freq: Once | INTRAMUSCULAR | Status: AC
  Administered 2019-01-27: 13:00:00 10 mg via INTRAVENOUS
  Filled 2019-01-27: qty 1

## 2019-01-27 MED ORDER — SODIUM CHLORIDE 0.9 % IV SOLN
75.0000 mg/m2 | Freq: Once | INTRAVENOUS | Status: AC
  Administered 2019-01-27: 100 mg via INTRAVENOUS
  Filled 2019-01-27: qty 10

## 2019-01-27 MED ORDER — HEPARIN SOD (PORK) LOCK FLUSH 100 UNIT/ML IV SOLN
250.0000 [IU] | INTRAVENOUS | Status: AC | PRN
  Administered 2019-01-27: 250 [IU]
  Filled 2019-01-27: qty 5

## 2019-01-27 NOTE — Progress Notes (Signed)
Platelets: 21,000. MD, Dr. Rogue Bussing, notified and aware. Per MD order: proceed with scheduled Vidaza treatment today.

## 2019-01-27 NOTE — Assessment & Plan Note (Addendum)
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 Currently on cycle #1- Vidaza + Venatoclax.  Day #28 bone marrow biopsy-  7/14- BMx- NO BLASTS; positive for dysplastic changes. Currently off Venatoclax [since 7/17] sec to cytopenias./And also status post fall intracranial bleed [see below]  #Today labs show-white count of 10/hemoglobin of 9.5 platelets 21.  Patient is off venetoclax for the last 1 month.  I think these counts are reflective of patient's bone marrow function.   #Restart taking venetoclax 100 mg once a day.  Also restart Vidaza day 1 thru 5.  Monitor blood counts closely.  No blood transfusion needed today.  # constipation/diarrhea- monitor closely; if worse recommend checking C.diff/GI PCR.   #Status post mechanical fall intracranial bleed-minor bleed; status post evaluation at [facility] neurosurgery.  Conservative measures no surgical options.  Monitor platelets closely.  #Right humeral fracture/rib fracture-conservative measures.  Sling.  Pain control.  Currently getting physical therapy.  # Supportive care:  # Continue Levaquin/Noxafil/acyclovir # PICC dressing weekly.   # weight loss: Status post nutrition evaluation stable  #Discussed with patient's husband-at length regarding patient's plan of care/labs etc. -------------------------------------------------------------------------------- # DISPOSITION: print labs   # Proceed with Vidaza [today- thru friday] # on 8/20-  check cbc /HOLD tube- possible platelet transfusion # Follow up with MD- 1 week;  cbc/cmp/[facility]- HOLD tube/-Dr.B

## 2019-01-27 NOTE — Patient Instructions (Signed)
#  Start taking venetoclax 100 mg once a day.

## 2019-01-27 NOTE — Progress Notes (Signed)
[facility] CONSULT NOTE  Patient Care Team: Rusty Aus, MD as PCP - General (Internal Medicine)  CHIEF COMPLAINTS/PURPOSE OF CONSULTATION:    Oncology History Overview Note  # SEP 2017- MYELOPROLIFERATIVE NEOPLASM- [WBC- 18; normal Hb/platelets] hypercellular bone marrow 90-95% proliferation of myeloid cells in various stages of maturation; relative erythroid hypoplasia and proliferation of atypical megakaryocytes; no increase in blasts; peripheral blood Bcr-Abl-NEG; Cytogenetics- WNL. NEG- Jak-2/MPL/CALR [place] limited- mild splenomegaly [~10cm; 463cm3]; OCT 2017- second opinion at [facility]. Surveillance.   # With progressive leukocytosis we evaluated her a month ago and sent BM for exam. She has a progressive leukocytosis so hydrea '500mg'$  daily was started on 10/20/2016 and increased to 2gm daily then back down to one daily, then 5 days per week over time due to counts drop. Then we held her hydrea since 04/09/2018 due to continued declining Hb and Plt.s  BMB 06/24/18 showed markedly hypercellular BM 95% with myeloid hyperplasia and atypical megakaryocytic hyperplasia. No significant increase in blasts. 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 BCR / ABL negative, FISH normal. Flow Showed 2%CD34-positive myeloid blasts. Myeloid precursors with low side scatter. Pathogenic variants were detected in the ASXL1, CCND2, CUX1, and U2AF1 genes.  # March 2020- wbc- 14/ Hb 9.5/ platelets- 54.   ---------------------------------------------------   # June 8th 2020- Acute myeloid leukemia [peripheral blood flow cytometry;NGS- pending]- June 15th Vidaza [SQ 1-5 + Venatoclax- #1in pt]; tumor lysis prophylaxis  # Day #28 bone marrow biopsy-  7/14- BMx- NO BLASTS; positive for dysplastic changes. Currently off Venatoclax [since 7/17] sec to cytopenias.  # aug 3rd 2020-    # 2002 [[place]] BREAST CA s/p Lumpect RT; [? Stage I] no  chemo s/p AI  # F-ONE HEM: FLT3 ALTERATION NOTED  DIAGNOSIS: ACUTE MYELOID LEUKEMIA GOALS: pallaitive  CURRENT/MOST RECENT THERAPY : Vidaza+ venotoclax.      MDS (myelodysplastic syndrome) ([place]) (Resolved)  08/28/2018 Initial Diagnosis   MDS (myelodysplastic syndrome) (HCC)   AML (acute myeloid leukemia) with failed remission ([place])  11/25/2018 Initial Diagnosis   AML (acute myeloid leukemia) with failed remission ([place])   11/25/2018 - 12/22/2018 Chemotherapy   The patient had azaCITIDine (VIDAZA) chemo injection 105 mg, 75 mg/m2 = 105 mg, Subcutaneous,  Once, 1 of 4 cycles Administration: 105 mg (11/25/2018), 105 mg (11/26/2018), 105 mg (11/27/2018), 105 mg (11/28/2018), 105 mg (11/29/2018)  for chemotherapy treatment.    01/27/2019 -  Chemotherapy   The patient had palonosetron (ALOXI) injection 0.25 mg, 0.25 mg, Intravenous,  Once, 0 of 4 cycles azaCITIDine (VIDAZA) 100 mg in sodium chloride 0.9 % 50 mL chemo infusion, 75 mg/m2 = 100 mg, Intravenous, Once, 0 of 4 cycles  for chemotherapy treatment.       HISTORY OF PRESENTING ILLNESS:  Kathryn Hahn 85 y.o.  female with acute myeloid leukemia with monocytic differentiation currently on Vidaza plus venetoclax is here for follow-up.  Patient's therapy has been on hold since July 17 because of severe cytopenias attributed to venetoclax.  Patient last received venetoclax exactly a month ago.  Unfortunately more recently given the recent fall rib fractures intracranial bleed chemotherapy was held.  Patient is here to restart cycle #2 of Vidaza.  Patient is currently in the rehab; tolerating fairly well.  Denies any headaches.  Denies any nausea vomiting.  Patient complained initially of constipation-treated with prune juice MiraLAX.  However last night she complained of 4-5 loose stools.  Currently status post Imodium improved.  No abdominal cramping  pain no blood.   Review of Systems  Constitutional: Positive for malaise/fatigue and  weight loss. Negative for chills, diaphoresis and fever.  HENT: Negative for nosebleeds and sore throat.   Eyes: Negative for double vision.  Respiratory: Positive for shortness of breath. Negative for cough, hemoptysis, sputum production and wheezing.   Cardiovascular: Negative for chest pain, palpitations and orthopnea.       Right chest wall pain/rib pain status post fractures.  Gastrointestinal: Positive for diarrhea. Negative for abdominal pain, blood in stool, heartburn, melena, nausea and vomiting.  Genitourinary: Negative for dysuria, frequency and urgency.  Musculoskeletal: Negative for back pain and joint pain.  Skin: Negative.  Negative for itching and rash.  Neurological: Negative for dizziness, tingling, focal weakness, weakness and headaches.  Psychiatric/Behavioral: Negative for depression. The patient is not nervous/anxious and does not have insomnia.     MEDICAL HISTORY:  Past Medical History:  Diagnosis Date  . Anemia   . Arthritis   . Breast cancer ([place]) 2003   RT LUMPECTOMY  . Edema leg   . History of breast cancer 2003   post lumpectomy  . History of kidney stones   . Hyperlipemia, mixed   . Hypertension, essential   . Leukemia ([place])   . Leukocytosis   . MDS (myelodysplastic syndrome) ([place])   . Personal history of radiation therapy 2003   BREAST CA  . Renal stones   . Vitamin D deficiency     SURGICAL HISTORY: Past Surgical History:  Procedure Laterality Date  . BREAST BIOPSY Right 2003   Postive for cancer  . BREAST LUMPECTOMY Right 2003   BREAST CA  . KIDNEY STONE SURGERY Right     SOCIAL HISTORY: Social History   Socioeconomic History  . Marital status: Married    Spouse name: Not on file  . Number of children: Not on file  . Years of education: Not on file  . Highest education level: Not on file  Occupational History  . Not on file  Social Needs  . Financial resource strain: Not on file  . Food insecurity    Worry: Not on file     Inability: Not on file  . Transportation needs    Medical: Not on file    Non-medical: Not on file  Tobacco Use  . Smoking status: Never Smoker  . Smokeless tobacco: Never Used  Substance and Sexual Activity  . Alcohol use: Yes    Alcohol/week: 3.0 standard drinks    Types: 3 Glasses of wine per week  . Drug use: No  . Sexual activity: Not on file  Lifestyle  . Physical activity    Days per week: Not on file    Minutes per session: Not on file  . Stress: Not on file  Relationships  . Social Herbalist on phone: Not on file    Gets together: Not on file    Attends religious service: Not on file    Active member of club or organization: Not on file    Attends meetings of clubs or organizations: Not on file    Relationship status: Not on file  . Intimate partner violence    Fear of current or ex partner: Not on file    Emotionally abused: Not on file    Physically abused: Not on file    Forced sexual activity: Not on file  Other Topics Concern  . Not on file  Social History Narrative    No  smoking/alcohol; lives in [place] with family.  She lives in assisted living with her husband.    FAMILY HISTORY: Family History  Problem Relation Age of Onset  . Stroke Mother   . Hypertension Father   . Stroke Father   . Breast cancer Neg Hx     ALLERGIES:  is allergic to terbinafine.  MEDICATIONS:  Current Outpatient Medications  Medication Sig Dispense Refill  . acyclovir (ZOVIRAX) 400 MG tablet One pill a day [to prevent shingles] 30 tablet 3  . atenolol (TENORMIN) 50 MG tablet Take 1 tablet by mouth 2 (two) times daily.    . calcium carbonate (OSCAL) 1500 (600 Ca) MG TABS tablet Take 600 mg of elemental calcium by mouth daily with breakfast.    . Cholecalciferol (VITAMIN D) 2000 units tablet Take 1 tablet by mouth daily.    Marland Kitchen levETIRAcetam (KEPPRA) 250 MG tablet Take 1 tablet (250 mg total) by mouth every morning. 2 tablet 0  . levofloxacin (LEVAQUIN) 250 MG  tablet Take 1 tablet (250 mg total) by mouth daily. 30 tablet 3  . losartan-hydrochlorothiazide (HYZAAR) 50-12.5 MG tablet Take 1 tablet by mouth daily.    . magic mouthwash w/lidocaine SOLN Take 5 mLs by mouth 4 (four) times daily as needed for mouth pain. 480 mL 3  . oxycodone (OXY-IR) 5 MG capsule Take 1 capsule (5 mg total) by mouth every 4 (four) hours as needed. 15 capsule 0  . posaconazole (NOXAFIL) 100 MG TBEC delayed-release tablet Take 2 tablets (200 mg total) by mouth daily. DO NOT START UNTIL-OK with MD. 60 tablet 3  . potassium chloride SA (KLOR-CON M20) 20 MEQ tablet TAKE 1 TABLET BY MOUTH TWICE A DAY (Patient taking differently: Take 20 mEq by mouth daily. ) 30 tablet 3   No current facility-administered medications for this visit.    Facility-Administered Medications Ordered in Other Visits  Medication Dose Route Frequency Provider Last Rate Last Dose  . heparin lock flush 100 unit/mL  250 Units Intracatheter PRN Charlaine Dalton R, MD      . sodium chloride flush (NS) 0.9 % injection 10 mL  10 mL Intracatheter PRN Cammie Sickle, MD          .  PHYSICAL EXAMINATION: ECOG PERFORMANCE STATUS: 0 - Asymptomatic  Vitals:   01/27/19 1054  BP: 139/79  Pulse: 71  Resp: 16  Temp: (!) 94.4 F (34.7 C)   Filed Weights   01/27/19 1054  Weight: 94 lb 6.4 oz (42.8 kg)    Physical Exam  Constitutional: She is oriented to person, place, and time and well-developed, well-nourished, and in no distress.  Alone.  She is in a wheelchair.  HENT:  Head: Normocephalic and atraumatic.  Mouth/Throat: Oropharynx is clear and moist. No oropharyngeal exudate.  Eyes: Pupils are equal, round, and reactive to light.  Neck: Normal range of motion. Neck supple.  Cardiovascular: Normal rate and regular rhythm.  Pulmonary/Chest: No respiratory distress. She has no wheezes.  Abdominal: Soft. Bowel sounds are normal. She exhibits no distension and no mass. There is no abdominal  tenderness. There is no rebound and no guarding.  Musculoskeletal: Normal range of motion.        General: Edema present. No tenderness.     Comments: Right upper extremity in sling.   Neurological: She is alert and oriented to person, place, and time.  Skin: Skin is warm.  Multiple bruises noted.  Positive for mild bleeding around the site of the PICC line  insertion.  Multiple abdominal wall bruises noted.  Psychiatric: Affect normal.  ;   LABORATORY DATA:  I have reviewed the data as listed Lab Results  Component Value Date   WBC 10.2 01/27/2019   HGB 8.4 (L) 01/27/2019   HCT 26.3 (L) 01/27/2019   MCV 77.6 (L) 01/27/2019   PLT 21 (LL) 01/27/2019   Recent Labs    01/10/19 0808 01/14/19 0420 01/17/19 1408 01/27/19 1045  NA 137 136 134* 135  K 3.5 3.0* 4.1 3.5  CL 103 98 98 98  CO2 '25 31 29 28  '$ GLUCOSE 137* 123* 144* 128*  BUN '19 16 18 '$ 24*  CREATININE 0.54 0.56 0.76 0.58  CALCIUM 8.7* 8.7* 9.5 9.3  GFRNONAA >60 >60 >60 >60  GFRAA >60 >60 >60 >60  PROT 6.5  --  6.9 6.7  ALBUMIN 3.3*  --  3.5 3.6  AST 29  --  30 31  ALT 16  --  18 14  ALKPHOS 89  --  99 122  BILITOT 0.8  --  0.9 0.6    RADIOGRAPHIC STUDIES: I have personally reviewed the radiological images as listed and agreed with the findings in the report. Dg Chest 1 View  Result Date: 01/14/2019 CLINICAL DATA:  Hypoxia. EXAM: CHEST  1 VIEW COMPARISON:  01/13/2019. FINDINGS: Left PICC line in stable position. Stable cardiomegaly. Dense bibasilar atelectasis/infiltrates again noted. Small bilateral pleural effusions. Prominent deformity of the right chest with multiple right rib fractures again noted. Associated right upper pleural thickening is noted. No pneumothorax. Proximal right humeral fracture. Surgical clips right chest. IMPRESSION: 1.  Left PICC line stable position 2.  Stable cardiomegaly. 3. Dense bibasilar atelectasis/infiltrates again noted. Small bilateral pleural effusions again noted. 4. Severe  deformity of the right chest with multiple right rib fractures again noted. Associated right upper lateral pleural thickening is noted. No pneumothorax. 5.  Proximal right humeral fracture. Electronically Signed   By: Marcello Moores  Register   On: 01/14/2019 05:36   Dg Chest 1 View  Result Date: 01/13/2019 CLINICAL DATA:  Recent history of fall. Hypoxia. History of breast cancer. History of leukemia. EXAM: CHEST  1 VIEW COMPARISON:  01/10/2019. FINDINGS: PICC line stable position. Mediastinum and hilar structures are stable. Stable cardiomegaly. Dense bibasilar atelectasis/infiltrate noted on today's exam. Small bilateral pleural effusions. Deformity noted of the right chest with multiple right rib fractures again noted. Surgical clips right chest. IMPRESSION: 1.  PICC line in stable position. 2.  Stable cardiomegaly. 3. Dense bibasilar atelectasis/infiltrates noted on today's exam. Small bilateral pleural effusions noted. 4. Deformity noted of the right chest with multiple right rib fractures again noted. No pneumothorax. Electronically Signed   By: Marcello Moores  Register   On: 01/13/2019 12:36   Dg Chest 2 View  Result Date: 01/10/2019 CLINICAL DATA:  RIGHT chest pain following fall.  Initial encounter. EXAM: CHEST - 2 VIEW COMPARISON:  None. FINDINGS: Cardiomegaly noted. A LEFT PICC line with tip overlying the LOWER SVC noted. Mild interstitial opacities bilaterally are identified. Trace pleural effusions are noted. No focal airspace disease or pneumothorax. Fractures of the RIGHT 5th through 8th ribs noted. Mildly impacted RIGHT humeral neck fracture extending into the head is identified. RIGHT breast/axillary surgical clips noted. IMPRESSION: 1. RIGHT 5th through 8th rib fractures. No evidence of pneumothorax. 2. RIGHT humeral neck/head fracture. 3. Cardiomegaly with mild interstitial opacities of uncertain chronicity. With trace bilateral pleural effusions, these interstitial opacities may represent mild  interstitial edema. Electronically Signed   By:  Margarette Canada M.D.   On: 01/10/2019 08:43   Dg Shoulder Right  Result Date: 01/10/2019 CLINICAL DATA:  Acute RIGHT shoulder pain following fall. Initial encounter. EXAM: RIGHT SHOULDER - 2+ VIEW COMPARISON:  None. FINDINGS: A mildly impacted humeral neck fracture is noted extending into the head and involving the greater tuberosity. No shoulder dislocation. Fractures of the RIGHT 5th through 8th ribs noted.  No pneumothorax. IMPRESSION: 1. Mildly impacted humeral neck fracture extending into the head and greater tuberosity. 2. RIGHT 5th through 8th rib fractures.  No pneumothorax. Electronically Signed   By: Margarette Canada M.D.   On: 01/10/2019 08:46   Ct Head Wo Contrast  Result Date: 01/10/2019 CLINICAL DATA:  Per neurosurgery repeat CT scan of the head in 6 hours. Pt ems from [facility] s/p fall mechanical pt c/o of rt shoulder pain. EXAM: CT HEAD WITHOUT CONTRAST TECHNIQUE: Contiguous axial images were obtained from the base of the skull through the vertex without intravenous contrast. COMPARISON:  01/10/2019 at 8:30 a.m. FINDINGS: Brain: No change in the small focus of hemorrhage noted along the anterior right frontal lobe. No new intracranial hemorrhage. No hydrocephalus or evidence of an ischemic infarct. Atrophy and mild chronic microvascular ischemic change is stable from the earlier exam. Vascular: No hyperdense vessel or unexpected calcification. Skull: Normal. Negative for fracture or focal lesion. Sinuses/Orbits: No acute finding. Other: None. IMPRESSION: 1. No change from the study obtained earlier today. 2. Trace posttraumatic hemorrhage along the anterior right frontal lobe. No new hemorrhage. Electronically Signed   By: Lajean Manes M.D.   On: 01/10/2019 15:30   Ct Head Wo Contrast  Addendum Date: 01/10/2019   ADDENDUM REPORT: 01/10/2019 09:31 ADDENDUM: Study discussed by telephone with Dr. Conni Slipper on 01/10/2019 at 0921 hours.  Electronically Signed   By: Genevie Ann M.D.   On: 01/10/2019 09:31   Result Date: 01/10/2019 CLINICAL DATA:  85 year old female status post fall this morning. Right orbits/frontal injury. Neck pain. EXAM: CT HEAD WITHOUT CONTRAST CT CERVICAL SPINE WITHOUT CONTRAST TECHNIQUE: Multidetector CT imaging of the head and cervical spine was performed following the standard protocol without intravenous contrast. Multiplanar CT image reconstructions of the cervical spine were also generated. COMPARISON:  CTA head 01/10/2017. CT face 01/09/2017. FINDINGS: CT HEAD FINDINGS Brain: Stable cerebral volume, normal for age. No midline shift, mass effect, or evidence of intracranial mass lesion. No ventriculomegaly. No intraventricular hemorrhage identified. Trace acute hemorrhage along the right anterior frontal convexity on series 2, image 13 appears to be subarachnoid rather than a hemorrhagic cerebral contusion. No other acute intracranial hemorrhage identified. Stable gray-white matter differentiation throughout the brain. No cortically based acute infarct identified. No cortical encephalomalacia. Normal basilar cisterns. Vascular: Calcified atherosclerosis at the skull base. No suspicious intracranial vascular hyperdensity. Skull: Intact. Sinuses/Orbits: Trace to mild paranasal sinus mucosal thickening is new since 2018. Tympanic cavities and mastoids remain clear. Other: Right posterior convexity scalp hematoma measuring up to 4 millimeters in thickness. Underlying calvarium intact. Stable and negative other orbit and scalp soft tissues. CT CERVICAL SPINE FINDINGS Alignment: Reversal of upper cervical lordosis is chronic and appears stable since 2018. Bilateral posterior element alignment is within normal limits. Cervicothoracic junction alignment is within normal limits. Skull base and vertebrae: Visualized skull base is intact. No atlanto-occipital dissociation. Congenital incomplete ossification of the posterior C1 ring. No  acute osseous abnormality identified. Soft tissues and spinal canal: No prevertebral fluid or swelling. No visible canal hematoma. Disc levels: Widespread advanced cervical  spine disc and endplate degeneration. Progressed left facet degeneration at C2-C3 since 2018. Mild if any associated cervical spinal stenosis. Upper chest: Grossly intact visible upper thoracic levels. Negative lung apices. IMPRESSION: 1. Positive for trace posttraumatic hemorrhage along the right anterior frontal lobe, favor trace subarachnoid over small hemorrhagic contusion. 2. No intracranial mass effect and no other acute traumatic injury to the brain identified. 3. Right posterior convexity scalp hematoma without underlying fracture. 4. No acute traumatic injury identified in the cervical spine. Electronically Signed: By: Genevie Ann M.D. On: 01/10/2019 09:09   Ct Cervical Spine Wo Contrast  Addendum Date: 01/10/2019   ADDENDUM REPORT: 01/10/2019 09:31 ADDENDUM: Study discussed by telephone with Dr. Conni Slipper on 01/10/2019 at 0921 hours. Electronically Signed   By: Genevie Ann M.D.   On: 01/10/2019 09:31   Result Date: 01/10/2019 CLINICAL DATA:  85 year old female status post fall this morning. Right orbits/frontal injury. Neck pain. EXAM: CT HEAD WITHOUT CONTRAST CT CERVICAL SPINE WITHOUT CONTRAST TECHNIQUE: Multidetector CT imaging of the head and cervical spine was performed following the standard protocol without intravenous contrast. Multiplanar CT image reconstructions of the cervical spine were also generated. COMPARISON:  CTA head 01/10/2017. CT face 01/09/2017. FINDINGS: CT HEAD FINDINGS Brain: Stable cerebral volume, normal for age. No midline shift, mass effect, or evidence of intracranial mass lesion. No ventriculomegaly. No intraventricular hemorrhage identified. Trace acute hemorrhage along the right anterior frontal convexity on series 2, image 13 appears to be subarachnoid rather than a hemorrhagic cerebral contusion. No  other acute intracranial hemorrhage identified. Stable gray-white matter differentiation throughout the brain. No cortically based acute infarct identified. No cortical encephalomalacia. Normal basilar cisterns. Vascular: Calcified atherosclerosis at the skull base. No suspicious intracranial vascular hyperdensity. Skull: Intact. Sinuses/Orbits: Trace to mild paranasal sinus mucosal thickening is new since 2018. Tympanic cavities and mastoids remain clear. Other: Right posterior convexity scalp hematoma measuring up to 4 millimeters in thickness. Underlying calvarium intact. Stable and negative other orbit and scalp soft tissues. CT CERVICAL SPINE FINDINGS Alignment: Reversal of upper cervical lordosis is chronic and appears stable since 2018. Bilateral posterior element alignment is within normal limits. Cervicothoracic junction alignment is within normal limits. Skull base and vertebrae: Visualized skull base is intact. No atlanto-occipital dissociation. Congenital incomplete ossification of the posterior C1 ring. No acute osseous abnormality identified. Soft tissues and spinal canal: No prevertebral fluid or swelling. No visible canal hematoma. Disc levels: Widespread advanced cervical spine disc and endplate degeneration. Progressed left facet degeneration at C2-C3 since 2018. Mild if any associated cervical spinal stenosis. Upper chest: Grossly intact visible upper thoracic levels. Negative lung apices. IMPRESSION: 1. Positive for trace posttraumatic hemorrhage along the right anterior frontal lobe, favor trace subarachnoid over small hemorrhagic contusion. 2. No intracranial mass effect and no other acute traumatic injury to the brain identified. 3. Right posterior convexity scalp hematoma without underlying fracture. 4. No acute traumatic injury identified in the cervical spine. Electronically Signed: By: Genevie Ann M.D. On: 01/10/2019 09:09   Ct Shoulder Right Wo Contrast  Result Date: 01/10/2019 CLINICAL  DATA:  Right shoulder pain after a fall today. Initial encounter. EXAM: CT OF THE UPPER RIGHT EXTREMITY WITHOUT CONTRAST TECHNIQUE: Multidetector CT imaging of the upper right extremity was performed according to the standard protocol. COMPARISON:  Plain films right shoulder earlier today. FINDINGS: Bones/Joint/Cartilage As seen on the comparison study, the patient has a transverse fracture through the metaphysis of the humerus. The articular surface of the humeral head is rotated  medially due to retraction by the rotator cuff. There is mild anterior displacement of the fracture. Nondisplaced component extends into the anterior aspect of the greater tuberosity. The lesser tuberosity is spared. The humeral head is located and the acromioclavicular joint is intact. Also seen are acute fractures of the posterior arcs of the right fifth, sixth, seventh and eighth ribs. No other fracture is identified. Ligaments Suboptimally assessed by CT. Muscles and Tendons Visualization of the rotator cuff is somewhat limited on CT but no fracture seen. Soft tissues There is some hemorrhage in the subacromial/subdeltoid bursa due to the patient's fracture. Small right pleural effusion is noted. No pneumothorax is identified but the anterior aspect of the right lung is seen only near the apex. Imaged lung parenchyma is clear with the exception of mild dependent atelectasis. IMPRESSION: Transverse fracture through the metaphysis of the humerus with mild impaction includes a nondisplaced component in the anterior aspect of the greater tuberosity. The humeral head is rotated medially due to retraction by the rotator cuff. Acute right fifth through eighth rib fractures with an associated small pleural effusion. No pneumothorax is seen but the anterior margin of the lung is only seen at the apex. Hemorrhage in the subacromial/subdeltoid bursa due to the patient's fracture. Electronically Signed   By: Inge Rise M.D.   On: 01/10/2019  15:34    ASSESSMENT & PLAN:   AML (acute myeloid leukemia) with failed remission ([facility]) #Acute myeloid leukemia with monocytic differentiation [20% blasts on peripheral blood flow cytometry]. Currently on cycle #1- Vidaza + Venatoclax.  Day #28 bone marrow biopsy-  7/14- BMx- NO BLASTS; positive for dysplastic changes. Currently off Venatoclax [since 7/17] sec to cytopenias./And also status post fall intracranial bleed [see below]  #Today labs show-white count of 10/hemoglobin of 9.5 platelets 21.  Patient is off venetoclax for the last 1 month.  I think these counts are reflective of patient's bone marrow function.   #Restart taking venetoclax 100 mg once a day.  Also restart Vidaza day 1 thru 5.  Monitor blood counts closely.  No blood transfusion needed today.  # constipation/diarrhea- monitor closely; if worse recommend checking C.diff/GI PCR.   #Status post mechanical fall intracranial bleed-minor bleed; status post evaluation at [facility] neurosurgery.  Conservative measures no surgical options.  Monitor platelets closely.  #Right humeral fracture/rib fracture-conservative measures.  Sling.  Pain control.  Currently getting physical therapy.  # Supportive care:  # Continue Levaquin/Noxafil/acyclovir # PICC dressing weekly.   # weight loss: Status post nutrition evaluation stable  #Discussed with patient's husband-at length regarding patient's plan of care/labs etc. -------------------------------------------------------------------------------- # DISPOSITION: print labs   # Proceed with Vidaza [today- thru friday] # on 8/20-  check cbc /HOLD tube- possible platelet transfusion # Follow up with MD- 1 week;  cbc/cmp/LDH- HOLD tube/-Dr.B    Cammie Sickle, MD 01/27/2019 11:34 AM

## 2019-01-28 ENCOUNTER — Inpatient Hospital Stay: Payer: Medicare Other

## 2019-01-28 ENCOUNTER — Other Ambulatory Visit: Payer: Self-pay

## 2019-01-28 VITALS — BP 99/67 | HR 71 | Temp 95.5°F | Resp 18

## 2019-01-28 DIAGNOSIS — C9200 Acute myeloblastic leukemia, not having achieved remission: Secondary | ICD-10-CM

## 2019-01-28 DIAGNOSIS — Z5111 Encounter for antineoplastic chemotherapy: Secondary | ICD-10-CM | POA: Diagnosis not present

## 2019-01-28 DIAGNOSIS — Z7189 Other specified counseling: Secondary | ICD-10-CM

## 2019-01-28 MED ORDER — HEPARIN SOD (PORK) LOCK FLUSH 100 UNIT/ML IV SOLN
250.0000 [IU] | Freq: Once | INTRAVENOUS | Status: AC | PRN
  Administered 2019-01-28: 13:00:00 250 [IU]
  Filled 2019-01-28: qty 5

## 2019-01-28 MED ORDER — SODIUM CHLORIDE 0.9 % IV SOLN
75.0000 mg/m2 | Freq: Once | INTRAVENOUS | Status: AC
  Administered 2019-01-28: 13:00:00 100 mg via INTRAVENOUS
  Filled 2019-01-28: qty 10

## 2019-01-28 MED ORDER — SODIUM CHLORIDE 0.9 % IV SOLN
Freq: Once | INTRAVENOUS | Status: AC
  Administered 2019-01-28: 12:00:00 via INTRAVENOUS
  Filled 2019-01-28: qty 250

## 2019-01-28 MED ORDER — DEXAMETHASONE SODIUM PHOSPHATE 10 MG/ML IJ SOLN
10.0000 mg | Freq: Once | INTRAMUSCULAR | Status: AC
  Administered 2019-01-28: 12:00:00 10 mg via INTRAVENOUS
  Filled 2019-01-28: qty 1

## 2019-01-28 NOTE — Telephone Encounter (Signed)
Oral Chemotherapy Pharmacist Encounter   Mr. Sottile called to get information on how to request a Noxafil refill for his wife. Instructed him to call [phone] and select option 3, to speak with a pharmacy staff member to set-up the refill.  He knows to call if there are any issues with requesting the refill.  Darl Pikes, PharmD, BCPS, [facility] Hematology/Oncology Clinical Pharmacist [facility] [phone]  01/28/2019 8:59 AM

## 2019-01-29 ENCOUNTER — Other Ambulatory Visit: Payer: Self-pay

## 2019-01-29 ENCOUNTER — Inpatient Hospital Stay: Payer: Medicare Other

## 2019-01-29 VITALS — BP 105/62 | HR 64 | Temp 96.8°F | Resp 18

## 2019-01-29 DIAGNOSIS — C9200 Acute myeloblastic leukemia, not having achieved remission: Secondary | ICD-10-CM

## 2019-01-29 DIAGNOSIS — Z5111 Encounter for antineoplastic chemotherapy: Secondary | ICD-10-CM | POA: Diagnosis not present

## 2019-01-29 DIAGNOSIS — Z7189 Other specified counseling: Secondary | ICD-10-CM

## 2019-01-29 MED ORDER — SODIUM CHLORIDE 0.9 % IV SOLN
75.0000 mg/m2 | Freq: Once | INTRAVENOUS | Status: AC
  Administered 2019-01-29: 100 mg via INTRAVENOUS
  Filled 2019-01-29: qty 10

## 2019-01-29 MED ORDER — DEXAMETHASONE SODIUM PHOSPHATE 10 MG/ML IJ SOLN
10.0000 mg | Freq: Once | INTRAMUSCULAR | Status: AC
  Administered 2019-01-29: 10 mg via INTRAVENOUS
  Filled 2019-01-29: qty 1

## 2019-01-29 MED ORDER — PALONOSETRON HCL INJECTION 0.25 MG/5ML
0.2500 mg | Freq: Once | INTRAVENOUS | Status: AC
  Administered 2019-01-29: 11:00:00 0.25 mg via INTRAVENOUS
  Filled 2019-01-29: qty 5

## 2019-01-29 MED ORDER — SODIUM CHLORIDE 0.9 % IV SOLN
Freq: Once | INTRAVENOUS | Status: AC
  Administered 2019-01-29: 11:00:00 via INTRAVENOUS
  Filled 2019-01-29: qty 250

## 2019-01-29 MED ORDER — HEPARIN SOD (PORK) LOCK FLUSH 100 UNIT/ML IV SOLN
250.0000 [IU] | Freq: Once | INTRAVENOUS | Status: AC | PRN
  Administered 2019-01-29: 250 [IU]
  Filled 2019-01-29: qty 5

## 2019-01-30 ENCOUNTER — Other Ambulatory Visit: Payer: Self-pay

## 2019-01-30 ENCOUNTER — Inpatient Hospital Stay: Payer: Medicare Other

## 2019-01-30 ENCOUNTER — Other Ambulatory Visit: Payer: Self-pay | Admitting: Internal Medicine

## 2019-01-30 VITALS — BP 106/72 | HR 71 | Temp 97.3°F | Resp 18 | Wt 94.8 lb

## 2019-01-30 DIAGNOSIS — C9200 Acute myeloblastic leukemia, not having achieved remission: Secondary | ICD-10-CM

## 2019-01-30 DIAGNOSIS — Z5111 Encounter for antineoplastic chemotherapy: Secondary | ICD-10-CM | POA: Diagnosis not present

## 2019-01-30 DIAGNOSIS — Z7189 Other specified counseling: Secondary | ICD-10-CM

## 2019-01-30 LAB — CBC WITH DIFFERENTIAL/PLATELET
Abs Immature Granulocytes: 2.58 10*3/uL — ABNORMAL HIGH (ref 0.00–0.07)
Basophils Absolute: 0.1 10*3/uL (ref 0.0–0.1)
Basophils Relative: 0 %
Eosinophils Absolute: 0 10*3/uL (ref 0.0–0.5)
Eosinophils Relative: 0 %
HCT: 28.7 % — ABNORMAL LOW (ref 36.0–46.0)
Hemoglobin: 9 g/dL — ABNORMAL LOW (ref 12.0–15.0)
Immature Granulocytes: 12 %
Lymphocytes Relative: 8 %
Lymphs Abs: 1.8 10*3/uL (ref 0.7–4.0)
MCH: 24.7 pg — ABNORMAL LOW (ref 26.0–34.0)
MCHC: 31.4 g/dL (ref 30.0–36.0)
MCV: 78.6 fL — ABNORMAL LOW (ref 80.0–100.0)
Monocytes Absolute: 8.2 10*3/uL — ABNORMAL HIGH (ref 0.1–1.0)
Monocytes Relative: 36 %
Neutro Abs: 9.7 10*3/uL — ABNORMAL HIGH (ref 1.7–7.7)
Neutrophils Relative %: 44 %
Platelets: 28 10*3/uL — CL (ref 150–400)
RBC: 3.65 MIL/uL — ABNORMAL LOW (ref 3.87–5.11)
RDW: 23.5 % — ABNORMAL HIGH (ref 11.5–15.5)
WBC: 22.4 10*3/uL — ABNORMAL HIGH (ref 4.0–10.5)
nRBC: 2.3 % — ABNORMAL HIGH (ref 0.0–0.2)

## 2019-01-30 LAB — SAMPLE TO BLOOD BANK

## 2019-01-30 MED ORDER — DEXAMETHASONE SODIUM PHOSPHATE 10 MG/ML IJ SOLN
10.0000 mg | Freq: Once | INTRAMUSCULAR | Status: AC
  Administered 2019-01-30: 10 mg via INTRAVENOUS
  Filled 2019-01-30: qty 1

## 2019-01-30 MED ORDER — HEPARIN SOD (PORK) LOCK FLUSH 100 UNIT/ML IV SOLN
250.0000 [IU] | Freq: Once | INTRAVENOUS | Status: AC | PRN
  Administered 2019-01-30: 250 [IU]
  Filled 2019-01-30: qty 5

## 2019-01-30 MED ORDER — SODIUM CHLORIDE 0.9 % IV SOLN
75.0000 mg/m2 | Freq: Once | INTRAVENOUS | Status: AC
  Administered 2019-01-30: 100 mg via INTRAVENOUS
  Filled 2019-01-30: qty 10

## 2019-01-30 MED ORDER — SODIUM CHLORIDE 0.9 % IV SOLN
Freq: Once | INTRAVENOUS | Status: AC
  Administered 2019-01-30: 10:00:00 via INTRAVENOUS
  Filled 2019-01-30: qty 250

## 2019-01-30 MED ORDER — SODIUM CHLORIDE 0.9% FLUSH
10.0000 mL | INTRAVENOUS | Status: DC | PRN
  Administered 2019-01-30: 10 mL via INTRAVENOUS
  Filled 2019-01-30: qty 10

## 2019-01-30 NOTE — Progress Notes (Signed)
Per MD ok to give chemo, with not all labs back.

## 2019-01-31 ENCOUNTER — Other Ambulatory Visit: Payer: Self-pay

## 2019-01-31 ENCOUNTER — Inpatient Hospital Stay: Payer: Medicare Other

## 2019-01-31 VITALS — BP 132/75 | HR 66 | Temp 97.0°F | Resp 20

## 2019-01-31 DIAGNOSIS — Z7189 Other specified counseling: Secondary | ICD-10-CM

## 2019-01-31 DIAGNOSIS — Z5111 Encounter for antineoplastic chemotherapy: Secondary | ICD-10-CM | POA: Diagnosis not present

## 2019-01-31 DIAGNOSIS — C9200 Acute myeloblastic leukemia, not having achieved remission: Secondary | ICD-10-CM

## 2019-01-31 MED ORDER — PALONOSETRON HCL INJECTION 0.25 MG/5ML
0.2500 mg | Freq: Once | INTRAVENOUS | Status: AC
  Administered 2019-01-31: 0.25 mg via INTRAVENOUS
  Filled 2019-01-31: qty 5

## 2019-01-31 MED ORDER — HEPARIN SOD (PORK) LOCK FLUSH 100 UNIT/ML IV SOLN
250.0000 [IU] | Freq: Once | INTRAVENOUS | Status: AC | PRN
  Administered 2019-01-31: 250 [IU]
  Filled 2019-01-31: qty 5

## 2019-01-31 MED ORDER — DEXAMETHASONE SODIUM PHOSPHATE 10 MG/ML IJ SOLN
10.0000 mg | Freq: Once | INTRAMUSCULAR | Status: AC
  Administered 2019-01-31: 10 mg via INTRAVENOUS
  Filled 2019-01-31: qty 1

## 2019-01-31 MED ORDER — SODIUM CHLORIDE 0.9 % IV SOLN
Freq: Once | INTRAVENOUS | Status: AC
  Administered 2019-01-31: 11:00:00 via INTRAVENOUS
  Filled 2019-01-31: qty 250

## 2019-01-31 MED ORDER — SODIUM CHLORIDE 0.9 % IV SOLN
75.0000 mg/m2 | Freq: Once | INTRAVENOUS | Status: AC
  Administered 2019-01-31: 100 mg via INTRAVENOUS
  Filled 2019-01-31: qty 10

## 2019-02-03 ENCOUNTER — Ambulatory Visit
Admission: RE | Admit: 2019-02-03 | Discharge: 2019-02-03 | Disposition: A | Discharge status: Home / Self Care | Payer: No Typology Code available for payment source | Source: Ambulatory Visit | Attending: Internal Medicine | Admitting: Internal Medicine

## 2019-02-03 ENCOUNTER — Inpatient Hospital Stay: Payer: Medicare Other | Admitting: *Deleted

## 2019-02-03 ENCOUNTER — Other Ambulatory Visit: Payer: Self-pay | Admitting: *Deleted

## 2019-02-03 ENCOUNTER — Inpatient Hospital Stay (HOSPITAL_BASED_OUTPATIENT_CLINIC_OR_DEPARTMENT_OTHER): Payer: Medicare Other | Admitting: Internal Medicine

## 2019-02-03 ENCOUNTER — Encounter: Payer: Self-pay | Admitting: Internal Medicine

## 2019-02-03 ENCOUNTER — Other Ambulatory Visit: Payer: Self-pay

## 2019-02-03 DIAGNOSIS — D696 Thrombocytopenia, unspecified: Secondary | ICD-10-CM | POA: Insufficient documentation

## 2019-02-03 DIAGNOSIS — Z5111 Encounter for antineoplastic chemotherapy: Secondary | ICD-10-CM | POA: Diagnosis not present

## 2019-02-03 DIAGNOSIS — C9200 Acute myeloblastic leukemia, not having achieved remission: Secondary | ICD-10-CM

## 2019-02-03 DIAGNOSIS — Z452 Encounter for adjustment and management of vascular access device: Secondary | ICD-10-CM | POA: Insufficient documentation

## 2019-02-03 DIAGNOSIS — Z95828 Presence of other vascular implants and grafts: Secondary | ICD-10-CM

## 2019-02-03 LAB — CBC WITH DIFFERENTIAL/PLATELET
Abs Immature Granulocytes: 0.22 10*3/uL — ABNORMAL HIGH (ref 0.00–0.07)
Basophils Absolute: 0 10*3/uL (ref 0.0–0.1)
Basophils Relative: 0 %
Eosinophils Absolute: 0 10*3/uL (ref 0.0–0.5)
Eosinophils Relative: 0 %
HCT: 19.7 % — ABNORMAL LOW (ref 36.0–46.0)
Hemoglobin: 6.3 g/dL — ABNORMAL LOW (ref 12.0–15.0)
Immature Granulocytes: 6 %
Lymphocytes Relative: 10 %
Lymphs Abs: 0.4 10*3/uL — ABNORMAL LOW (ref 0.7–4.0)
MCH: 25 pg — ABNORMAL LOW (ref 26.0–34.0)
MCHC: 32 g/dL (ref 30.0–36.0)
MCV: 78.2 fL — ABNORMAL LOW (ref 80.0–100.0)
Monocytes Absolute: 1.8 10*3/uL — ABNORMAL HIGH (ref 0.1–1.0)
Monocytes Relative: 47 %
Neutro Abs: 1.4 10*3/uL — ABNORMAL LOW (ref 1.7–7.7)
Neutrophils Relative %: 37 %
Platelets: 5 10*3/uL — CL (ref 150–400)
RBC: 2.52 MIL/uL — ABNORMAL LOW (ref 3.87–5.11)
RDW: 22.9 % — ABNORMAL HIGH (ref 11.5–15.5)
WBC: 3.8 10*3/uL — ABNORMAL LOW (ref 4.0–10.5)
nRBC: 3.7 % — ABNORMAL HIGH (ref 0.0–0.2)

## 2019-02-03 LAB — COMPREHENSIVE METABOLIC PANEL
ALT: 30 U/L (ref 0–44)
AST: 248 U/L — ABNORMAL HIGH (ref 15–41)
Albumin: 3.7 g/dL (ref 3.5–5.0)
Alkaline Phosphatase: 97 U/L (ref 38–126)
Anion gap: 13 (ref 5–15)
BUN: 65 mg/dL — ABNORMAL HIGH (ref 8–23)
CO2: 26 mmol/L (ref 22–32)
Calcium: 8.7 mg/dL — ABNORMAL LOW (ref 8.9–10.3)
Chloride: 99 mmol/L (ref 98–111)
Creatinine, Ser: 0.73 mg/dL (ref 0.44–1.00)
GFR calc Af Amer: >60 mL/min (ref >60)
GFR calc non Af Amer: >60 mL/min (ref >60)
Glucose, Bld: 175 mg/dL — ABNORMAL HIGH (ref 70–99)
Potassium: 3.4 mmol/L — ABNORMAL LOW (ref 3.5–5.1)
Sodium: 138 mmol/L (ref 135–145)
Total Bilirubin: 0.8 mg/dL (ref 0.3–1.2)
Total Protein: 6.3 g/dL — ABNORMAL LOW (ref 6.5–8.1)

## 2019-02-03 LAB — PREPARE RBC (CROSSMATCH)

## 2019-02-03 LAB — SAMPLE TO BLOOD BANK

## 2019-02-03 MED ORDER — HEPARIN SOD (PORK) LOCK FLUSH 100 UNIT/ML IV SOLN
500.0000 [IU] | Freq: Once | INTRAVENOUS | Status: AC
  Administered 2019-02-03: 500 [IU] via INTRAVENOUS

## 2019-02-03 MED ORDER — DIPHENHYDRAMINE HCL 25 MG PO CAPS
ORAL_CAPSULE | ORAL | Status: AC
  Administered 2019-02-03: 25 mg via ORAL
  Filled 2019-02-03: qty 1

## 2019-02-03 MED ORDER — ACETAMINOPHEN 500 MG PO TABS
ORAL_TABLET | ORAL | Status: AC
  Filled 2019-02-03: qty 2

## 2019-02-03 MED ORDER — SODIUM CHLORIDE FLUSH 0.9 % IV SOLN
INTRAVENOUS | Status: AC
  Filled 2019-02-03: qty 20

## 2019-02-03 MED ORDER — ACETAMINOPHEN 325 MG PO TABS
ORAL_TABLET | ORAL | Status: AC
  Administered 2019-02-03: 650 mg via ORAL
  Filled 2019-02-03: qty 2

## 2019-02-03 MED ORDER — SODIUM CHLORIDE 0.9% IV SOLUTION: 250.0000 mL | Freq: Once | INTRAVENOUS | Status: DC

## 2019-02-03 MED ORDER — SODIUM CHLORIDE FLUSH 0.9 % IV SOLN
INTRAVENOUS | Status: AC
  Filled 2019-02-03: qty 10

## 2019-02-03 MED ORDER — DIPHENHYDRAMINE HCL 25 MG PO CAPS
25.0000 mg | ORAL_CAPSULE | Freq: Once | ORAL | Status: AC
  Administered 2019-02-03: 25 mg via ORAL

## 2019-02-03 MED ORDER — SODIUM CHLORIDE 0.9% FLUSH
10.0000 mL | Freq: Once | INTRAVENOUS | Status: AC
  Administered 2019-02-03: 10 mL via INTRAVENOUS
  Filled 2019-02-03: qty 10

## 2019-02-03 MED ORDER — SODIUM CHLORIDE 0.9% FLUSH: 10.0000 mL | INTRAVENOUS | Status: DC | PRN

## 2019-02-03 MED ORDER — ACETAMINOPHEN 325 MG PO TABS
650.0000 mg | ORAL_TABLET | Freq: Once | ORAL | Status: AC
  Administered 2019-02-03: 650 mg via ORAL

## 2019-02-03 MED ORDER — HEPARIN SOD (PORK) LOCK FLUSH 100 UNIT/ML IV SOLN
250.0000 [IU] | INTRAVENOUS | Status: AC | PRN
  Administered 2019-02-03: 250 [IU]

## 2019-02-03 MED ORDER — HEPARIN SOD (PORK) LOCK FLUSH 100 UNIT/ML IV SOLN
INTRAVENOUS | Status: AC
  Administered 2019-02-03: 16:00:00 250 [IU]
  Filled 2019-02-03: qty 5

## 2019-02-03 NOTE — Assessment & Plan Note (Addendum)
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 Currently on cycle #1- Vidaza + Venatoclax.  Day #28 bone marrow biopsy-  7/14- BMx- NO BLASTS; positive for dysplastic changes.   # S/p Vidaza cycle #2 on 8/17; plus ven 100 mg/day. Today day #8 today-white count 3.8; hemoglobin 6.3 platelets 5. STOP Venatoclax today.  #Proceed with PRBC transfusion/platelet transfusion; reevaluate in 2 days.  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  Awaiting peripheral blood flow cytometry.  Continue prophylactic antibiotics.  # AST elevation- 248-? Etiology; normal ALT/al phos/bilirubin. Will repeat again in 2 days.   #Status post mechanical fall intracranial bleed-minor bleed; status post evaluation at [facility] neurosurgery.  Conservative measures no surgical options.   #Right humeral fracture/rib fracture-conservative measures.  Stable.  # Supportive care:  # Continue Levaquin/Noxafil/acyclovir # PICC dressing weekly; discussed that not recommend changing the PICC line at this time.   #Discussed with patient's husband-at length regarding patient's plan of care/labs etc. verbalized understanding to stop venetoclax-under further directions. -------------------------------------------------------------------------------- # DISPOSITION:    # PRBC /platelets today in SDS.  # on 8/26[facility]- labs- cbc /LFts- HOLD tube-possible platelet transfusion  # Follow up with MD- 1 week;  cbc/cmp/LDH- HOLD tube/-Dr.B

## 2019-02-03 NOTE — Progress Notes (Signed)
critical results - plt count 5. Critical called by [facility], lab- 1052 read back process complete.  Dr. Rogue Bussing made aware 1052-read back process performed with md.  unable to accomodate pt for 1 unit of blood today in c.ctr. due to acuity. pt needs 1 unit of irradiated blood hgb 6.3 and 1 unit of irradiated ptls today in SDS. Called report to [facility] in [place]. Pt transported to SDS unit 1117.

## 2019-02-03 NOTE — Progress Notes (Signed)
[facility] CONSULT NOTE  Patient Care Team: Rusty Aus, MD as PCP - General (Internal Medicine)  CHIEF COMPLAINTS/PURPOSE OF CONSULTATION:    Oncology History Overview Note  # SEP 2017- MYELOPROLIFERATIVE NEOPLASM- [WBC- 37; normal Hb/platelets] hypercellular bone marrow 90-95% proliferation of myeloid cells in various stages of maturation; relative erythroid hypoplasia and proliferation of atypical megakaryocytes; no increase in blasts; peripheral blood Bcr-Abl-NEG; Cytogenetics- WNL. NEG- Jak-2/MPL/CALR [place] limited- mild splenomegaly [~10cm; 463cm3]; OCT 2017- second opinion at [facility]. Surveillance.   # With progressive leukocytosis we evaluated her a month ago and sent BM for exam. She has a progressive leukocytosis so hydrea '500mg'$  daily was started on 10/20/2016 and increased to 2gm daily then back down to one daily, then 5 days per week over time due to counts drop. Then we held her hydrea since 04/09/2018 due to continued declining Hb and Plt.s  BMB 06/24/18 showed markedly hypercellular BM 95% with myeloid hyperplasia and atypical megakaryocytic hyperplasia. No significant increase in blasts. 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 BCR / ABL negative, FISH normal. Flow Showed 2%CD34-positive myeloid blasts. Myeloid precursors with low side scatter. Pathogenic variants were detected in the ASXL1, CCND2, CUX1, and U2AF1 genes.  # March 2020- wbc- 14/ Hb 9.5/ platelets- 54.   ---------------------------------------------------   # June 8th 2020- Acute myeloid leukemia [peripheral blood flow cytometry;NGS- pending]- June 15th Vidaza [SQ 1-5 + Venatoclax- #1in pt]; tumor lysis prophylaxis  # Day #28 bone marrow biopsy-  7/14- BMx- NO BLASTS; positive for dysplastic changes. Currently off Venatoclax [since 7/17] sec to cytopenias.  # aug 3rd 2020-    # 2002 [[place]] BREAST CA s/p Lumpect RT; [? Stage I] no  chemo s/p AI  # F-ONE HEM: FLT3 ALTERATION NOTED  DIAGNOSIS: ACUTE MYELOID LEUKEMIA GOALS: pallaitive  CURRENT/MOST RECENT THERAPY : Vidaza+ venotoclax.      MDS (myelodysplastic syndrome) ([facility]) (Resolved)  08/28/2018 Initial Diagnosis   MDS (myelodysplastic syndrome) (HCC)   AML (acute myeloid leukemia) with failed remission ([place])  11/25/2018 Initial Diagnosis   AML (acute myeloid leukemia) with failed remission ([place])   11/25/2018 - 12/22/2018 Chemotherapy   The patient had azaCITIDine (VIDAZA) chemo injection 105 mg, 75 mg/m2 = 105 mg, Subcutaneous,  Once, 1 of 4 cycles Administration: 105 mg (11/25/2018), 105 mg (11/26/2018), 105 mg (11/27/2018), 105 mg (11/28/2018), 105 mg (11/29/2018)  for chemotherapy treatment.    01/27/2019 -  Chemotherapy   The patient had palonosetron (ALOXI) injection 0.25 mg, 0.25 mg, Intravenous,  Once, 1 of 4 cycles Administration: 0.25 mg (01/27/2019), 0.25 mg (01/29/2019), 0.25 mg (01/31/2019) azaCITIDine (VIDAZA) 100 mg in sodium chloride 0.9 % 50 mL chemo infusion, 75 mg/m2 = 100 mg, Intravenous, Once, 1 of 4 cycles Administration: 100 mg (01/27/2019), 100 mg (01/28/2019), 100 mg (01/29/2019), 100 mg (01/30/2019), 100 mg (01/31/2019)  for chemotherapy treatment.      HISTORY OF PRESENTING ILLNESS:  Kathryn Hahn 85 y.o.  female with acute myeloid leukemia with monocytic differentiation currently on Vidaza plus venetoclax is here for follow-up.  Patient received cycle #2 Vidaza plus venetoclax 1 week ago.  Patient complains of severe fatigue.  Also complains of mild to moderate swelling in the legs.  She complains of shortness of breath with exertion; no cough.  No fevers/chills.  Complains of constipation but no diarrhea.   Review of Systems  Constitutional: Positive for malaise/fatigue and weight loss. Negative for chills, diaphoresis and fever.  HENT: Negative for nosebleeds and sore  throat.   Eyes: Negative for double vision.  Respiratory: Positive  for shortness of breath. Negative for cough, hemoptysis, sputum production and wheezing.   Cardiovascular: Positive for leg swelling. Negative for chest pain, palpitations and orthopnea.       Right chest wall pain/rib pain status post fractures.  Gastrointestinal: Positive for constipation. Negative for abdominal pain, blood in stool, heartburn, melena, nausea and vomiting.  Genitourinary: Negative for dysuria, frequency and urgency.  Musculoskeletal: Negative for back pain and joint pain.  Skin: Negative.  Negative for itching and rash.  Neurological: Negative for dizziness, tingling, focal weakness, weakness and headaches.  Endo/Heme/Allergies: Bruises/bleeds easily.  Psychiatric/Behavioral: Negative for depression. The patient is not nervous/anxious and does not have insomnia.     MEDICAL HISTORY:  Past Medical History:  Diagnosis Date  . Anemia   . Arthritis   . Breast cancer ([place]) 2003   RT LUMPECTOMY  . Edema leg   . History of breast cancer 2003   post lumpectomy  . History of kidney stones   . Hyperlipemia, mixed   . Hypertension, essential   . Leukemia ([place])   . Leukocytosis   . MDS (myelodysplastic syndrome) ([place])   . Personal history of radiation therapy 2003   BREAST CA  . Renal stones   . Vitamin D deficiency     SURGICAL HISTORY: Past Surgical History:  Procedure Laterality Date  . BREAST BIOPSY Right 2003   Postive for cancer  . BREAST LUMPECTOMY Right 2003   BREAST CA  . KIDNEY STONE SURGERY Right     SOCIAL HISTORY: Social History   Socioeconomic History  . Marital status: Married    Spouse name: Not on file  . Number of children: Not on file  . Years of education: Not on file  . Highest education level: Not on file  Occupational History  . Not on file  Social Needs  . Financial resource strain: Not on file  . Food insecurity    Worry: Not on file    Inability: Not on file  . Transportation needs    Medical: Not on file    Non-medical:  Not on file  Tobacco Use  . Smoking status: Never Smoker  . Smokeless tobacco: Never Used  Substance and Sexual Activity  . Alcohol use: Yes    Alcohol/week: 3.0 standard drinks    Types: 3 Glasses of wine per week  . Drug use: No  . Sexual activity: Not on file  Lifestyle  . Physical activity    Days per week: Not on file    Minutes per session: Not on file  . Stress: Not on file  Relationships  . Social Herbalist on phone: Not on file    Gets together: Not on file    Attends religious service: Not on file    Active member of club or organization: Not on file    Attends meetings of clubs or organizations: Not on file    Relationship status: Not on file  . Intimate partner violence    Fear of current or ex partner: Not on file    Emotionally abused: Not on file    Physically abused: Not on file    Forced sexual activity: Not on file  Other Topics Concern  . Not on file  Social History Narrative    No smoking/alcohol; lives in [place] with family.  She lives in assisted living with her husband.    FAMILY HISTORY: Family  History  Problem Relation Age of Onset  . Stroke Mother   . Hypertension Father   . Stroke Father   . Breast cancer Neg Hx     ALLERGIES:  is allergic to terbinafine.  MEDICATIONS:  Current Outpatient Medications  Medication Sig Dispense Refill  . acyclovir (ZOVIRAX) 400 MG tablet One pill a day [to prevent shingles] 30 tablet 3  . atenolol (TENORMIN) 50 MG tablet Take 1 tablet by mouth 2 (two) times daily.    . calcium carbonate (OSCAL) 1500 (600 Ca) MG TABS tablet Take 600 mg of elemental calcium by mouth daily with breakfast.    . Cholecalciferol (VITAMIN D) 2000 units tablet Take 1 tablet by mouth daily.    Marland Kitchen levETIRAcetam (KEPPRA) 250 MG tablet Take 1 tablet (250 mg total) by mouth every morning. 2 tablet 0  . levofloxacin (LEVAQUIN) 250 MG tablet Take 1 tablet (250 mg total) by mouth daily. 30 tablet 3  .  losartan-hydrochlorothiazide (HYZAAR) 50-12.5 MG tablet Take 1 tablet by mouth daily.    . magic mouthwash w/lidocaine SOLN Take 5 mLs by mouth 4 (four) times daily as needed for mouth pain. 480 mL 3  . oxycodone (OXY-IR) 5 MG capsule Take 1 capsule (5 mg total) by mouth every 4 (four) hours as needed. 15 capsule 0  . posaconazole (NOXAFIL) 100 MG TBEC delayed-release tablet Take 2 tablets (200 mg total) by mouth daily. DO NOT START UNTIL-OK with MD. 60 tablet 3  . potassium chloride SA (KLOR-CON M20) 20 MEQ tablet TAKE 1 TABLET BY MOUTH TWICE A DAY (Patient taking differently: Take 20 mEq by mouth daily. ) 30 tablet 3   No current facility-administered medications for this visit.    Facility-Administered Medications Ordered in Other Visits  Medication Dose Route Frequency Provider Last Rate Last Dose  . 0.9 %  sodium chloride infusion (Manually program via Guardrails IV Fluids)  250 mL Intravenous Once Charlaine Dalton R, MD      . 0.9 %  sodium chloride infusion (Manually program via Guardrails IV Fluids)  250 mL Intravenous Once Charlaine Dalton R, MD      . acetaminophen (TYLENOL) 500 MG tablet           . heparin lock flush 100 unit/mL  250 Units Intracatheter PRN Charlaine Dalton R, MD      . sodium chloride flush (NS) 0.9 % injection 10 mL  10 mL Intracatheter PRN Charlaine Dalton R, MD      . sodium chloride flush 0.9 % injection           . sodium chloride flush 0.9 % injection               .  PHYSICAL EXAMINATION: ECOG PERFORMANCE STATUS: 0 - Asymptomatic  Vitals:   02/03/19 1047  BP: 100/66  Pulse: 83  Resp: 16  Temp: 97.9 F (36.6 C)   Filed Weights   02/03/19 1047  Weight: 99 lb (44.9 kg)    Physical Exam  Constitutional: She is oriented to person, place, and time and well-developed, well-nourished, and in no distress.  Alone.  She is in a wheelchair.  HENT:  Head: Normocephalic and atraumatic.  Mouth/Throat: Oropharynx is clear and moist. No  oropharyngeal exudate.  Eyes: Pupils are equal, round, and reactive to light.  Neck: Normal range of motion. Neck supple.  Cardiovascular: Normal rate and regular rhythm.  Pulmonary/Chest: No respiratory distress. She has no wheezes.  Abdominal: Soft. Bowel sounds are normal. She  exhibits no distension and no mass. There is no abdominal tenderness. There is no rebound and no guarding.  Musculoskeletal: Normal range of motion.        General: Edema present. No tenderness.     Comments: Right upper extremity in sling.   Neurological: She is alert and oriented to person, place, and time.  Skin: Skin is warm.  Multiple bruises noted.  Positive for mild bleeding around the site of the PICC line insertion.  Multiple abdominal wall bruises noted.  Psychiatric: Affect normal.  ;   LABORATORY DATA:  I have reviewed the data as listed Lab Results  Component Value Date   WBC 3.8 (L) 02/03/2019   HGB 6.3 (L) 02/03/2019   HCT 19.7 (L) 02/03/2019   MCV 78.2 (L) 02/03/2019   PLT 5 (LL) 02/03/2019   Recent Labs    01/17/19 1408 01/27/19 1045 02/03/19 1008  NA 134* 135 138  K 4.1 3.5 3.4*  CL 98 98 99  CO2 '29 28 26  '$ GLUCOSE 144* 128* 175*  BUN 18 24* 65*  CREATININE 0.76 0.58 0.73  CALCIUM 9.5 9.3 8.7*  GFRNONAA >60 >60 >60  GFRAA >60 >60 >60  PROT 6.9 6.7 6.3*  ALBUMIN 3.5 3.6 3.7  AST 30 31 248*  ALT '18 14 30  '$ ALKPHOS 99 122 97  BILITOT 0.9 0.6 0.8    RADIOGRAPHIC STUDIES: I have personally reviewed the radiological images as listed and agreed with the findings in the report. Dg Chest 1 View  Result Date: 01/14/2019 CLINICAL DATA:  Hypoxia. EXAM: CHEST  1 VIEW COMPARISON:  01/13/2019. FINDINGS: Left PICC line in stable position. Stable cardiomegaly. Dense bibasilar atelectasis/infiltrates again noted. Small bilateral pleural effusions. Prominent deformity of the right chest with multiple right rib fractures again noted. Associated right upper pleural thickening is noted. No  pneumothorax. Proximal right humeral fracture. Surgical clips right chest. IMPRESSION: 1.  Left PICC line stable position 2.  Stable cardiomegaly. 3. Dense bibasilar atelectasis/infiltrates again noted. Small bilateral pleural effusions again noted. 4. Severe deformity of the right chest with multiple right rib fractures again noted. Associated right upper lateral pleural thickening is noted. No pneumothorax. 5.  Proximal right humeral fracture. Electronically Signed   By: Marcello Moores  Register   On: 01/14/2019 05:36   Dg Chest 1 View  Result Date: 01/13/2019 CLINICAL DATA:  Recent history of fall. Hypoxia. History of breast cancer. History of leukemia. EXAM: CHEST  1 VIEW COMPARISON:  01/10/2019. FINDINGS: PICC line stable position. Mediastinum and hilar structures are stable. Stable cardiomegaly. Dense bibasilar atelectasis/infiltrate noted on today's exam. Small bilateral pleural effusions. Deformity noted of the right chest with multiple right rib fractures again noted. Surgical clips right chest. IMPRESSION: 1.  PICC line in stable position. 2.  Stable cardiomegaly. 3. Dense bibasilar atelectasis/infiltrates noted on today's exam. Small bilateral pleural effusions noted. 4. Deformity noted of the right chest with multiple right rib fractures again noted. No pneumothorax. Electronically Signed   By: Marcello Moores  Register   On: 01/13/2019 12:36   Dg Chest 2 View  Result Date: 01/10/2019 CLINICAL DATA:  RIGHT chest pain following fall.  Initial encounter. EXAM: CHEST - 2 VIEW COMPARISON:  None. FINDINGS: Cardiomegaly noted. A LEFT PICC line with tip overlying the LOWER SVC noted. Mild interstitial opacities bilaterally are identified. Trace pleural effusions are noted. No focal airspace disease or pneumothorax. Fractures of the RIGHT 5th through 8th ribs noted. Mildly impacted RIGHT humeral neck fracture extending into the head is  identified. RIGHT breast/axillary surgical clips noted. IMPRESSION: 1. RIGHT 5th  through 8th rib fractures. No evidence of pneumothorax. 2. RIGHT humeral neck/head fracture. 3. Cardiomegaly with mild interstitial opacities of uncertain chronicity. With trace bilateral pleural effusions, these interstitial opacities may represent mild interstitial edema. Electronically Signed   By: Margarette Canada M.D.   On: 01/10/2019 08:43   Dg Shoulder Right  Result Date: 01/10/2019 CLINICAL DATA:  Acute RIGHT shoulder pain following fall. Initial encounter. EXAM: RIGHT SHOULDER - 2+ VIEW COMPARISON:  None. FINDINGS: A mildly impacted humeral neck fracture is noted extending into the head and involving the greater tuberosity. No shoulder dislocation. Fractures of the RIGHT 5th through 8th ribs noted.  No pneumothorax. IMPRESSION: 1. Mildly impacted humeral neck fracture extending into the head and greater tuberosity. 2. RIGHT 5th through 8th rib fractures.  No pneumothorax. Electronically Signed   By: Margarette Canada M.D.   On: 01/10/2019 08:46   Ct Head Wo Contrast  Result Date: 01/10/2019 CLINICAL DATA:  Per neurosurgery repeat CT scan of the head in 6 hours. Pt ems from [facility] s/p fall mechanical pt c/o of rt shoulder pain. EXAM: CT HEAD WITHOUT CONTRAST TECHNIQUE: Contiguous axial images were obtained from the base of the skull through the vertex without intravenous contrast. COMPARISON:  01/10/2019 at 8:30 a.m. FINDINGS: Brain: No change in the small focus of hemorrhage noted along the anterior right frontal lobe. No new intracranial hemorrhage. No hydrocephalus or evidence of an ischemic infarct. Atrophy and mild chronic microvascular ischemic change is stable from the earlier exam. Vascular: No hyperdense vessel or unexpected calcification. Skull: Normal. Negative for fracture or focal lesion. Sinuses/Orbits: No acute finding. Other: None. IMPRESSION: 1. No change from the study obtained earlier today. 2. Trace posttraumatic hemorrhage along the anterior right frontal lobe. No new  hemorrhage. Electronically Signed   By: Lajean Manes M.D.   On: 01/10/2019 15:30   Ct Head Wo Contrast  Addendum Date: 01/10/2019   ADDENDUM REPORT: 01/10/2019 09:31 ADDENDUM: Study discussed by telephone with Dr. Conni Slipper on 01/10/2019 at 0921 hours. Electronically Signed   By: Genevie Ann M.D.   On: 01/10/2019 09:31   Result Date: 01/10/2019 CLINICAL DATA:  85 year old female status post fall this morning. Right orbits/frontal injury. Neck pain. EXAM: CT HEAD WITHOUT CONTRAST CT CERVICAL SPINE WITHOUT CONTRAST TECHNIQUE: Multidetector CT imaging of the head and cervical spine was performed following the standard protocol without intravenous contrast. Multiplanar CT image reconstructions of the cervical spine were also generated. COMPARISON:  CTA head 01/10/2017. CT face 01/09/2017. FINDINGS: CT HEAD FINDINGS Brain: Stable cerebral volume, normal for age. No midline shift, mass effect, or evidence of intracranial mass lesion. No ventriculomegaly. No intraventricular hemorrhage identified. Trace acute hemorrhage along the right anterior frontal convexity on series 2, image 13 appears to be subarachnoid rather than a hemorrhagic cerebral contusion. No other acute intracranial hemorrhage identified. Stable gray-white matter differentiation throughout the brain. No cortically based acute infarct identified. No cortical encephalomalacia. Normal basilar cisterns. Vascular: Calcified atherosclerosis at the skull base. No suspicious intracranial vascular hyperdensity. Skull: Intact. Sinuses/Orbits: Trace to mild paranasal sinus mucosal thickening is new since 2018. Tympanic cavities and mastoids remain clear. Other: Right posterior convexity scalp hematoma measuring up to 4 millimeters in thickness. Underlying calvarium intact. Stable and negative other orbit and scalp soft tissues. CT CERVICAL SPINE FINDINGS Alignment: Reversal of upper cervical lordosis is chronic and appears stable since 2018. Bilateral posterior  element alignment is within normal limits.  Cervicothoracic junction alignment is within normal limits. Skull base and vertebrae: Visualized skull base is intact. No atlanto-occipital dissociation. Congenital incomplete ossification of the posterior C1 ring. No acute osseous abnormality identified. Soft tissues and spinal canal: No prevertebral fluid or swelling. No visible canal hematoma. Disc levels: Widespread advanced cervical spine disc and endplate degeneration. Progressed left facet degeneration at C2-C3 since 2018. Mild if any associated cervical spinal stenosis. Upper chest: Grossly intact visible upper thoracic levels. Negative lung apices. IMPRESSION: 1. Positive for trace posttraumatic hemorrhage along the right anterior frontal lobe, favor trace subarachnoid over small hemorrhagic contusion. 2. No intracranial mass effect and no other acute traumatic injury to the brain identified. 3. Right posterior convexity scalp hematoma without underlying fracture. 4. No acute traumatic injury identified in the cervical spine. Electronically Signed: By: Genevie Ann M.D. On: 01/10/2019 09:09   Ct Cervical Spine Wo Contrast  Addendum Date: 01/10/2019   ADDENDUM REPORT: 01/10/2019 09:31 ADDENDUM: Study discussed by telephone with Dr. Conni Slipper on 01/10/2019 at 0921 hours. Electronically Signed   By: Genevie Ann M.D.   On: 01/10/2019 09:31   Result Date: 01/10/2019 CLINICAL DATA:  86 year old female status post fall this morning. Right orbits/frontal injury. Neck pain. EXAM: CT HEAD WITHOUT CONTRAST CT CERVICAL SPINE WITHOUT CONTRAST TECHNIQUE: Multidetector CT imaging of the head and cervical spine was performed following the standard protocol without intravenous contrast. Multiplanar CT image reconstructions of the cervical spine were also generated. COMPARISON:  CTA head 01/10/2017. CT face 01/09/2017. FINDINGS: CT HEAD FINDINGS Brain: Stable cerebral volume, normal for age. No midline shift, mass effect, or evidence  of intracranial mass lesion. No ventriculomegaly. No intraventricular hemorrhage identified. Trace acute hemorrhage along the right anterior frontal convexity on series 2, image 13 appears to be subarachnoid rather than a hemorrhagic cerebral contusion. No other acute intracranial hemorrhage identified. Stable gray-white matter differentiation throughout the brain. No cortically based acute infarct identified. No cortical encephalomalacia. Normal basilar cisterns. Vascular: Calcified atherosclerosis at the skull base. No suspicious intracranial vascular hyperdensity. Skull: Intact. Sinuses/Orbits: Trace to mild paranasal sinus mucosal thickening is new since 2018. Tympanic cavities and mastoids remain clear. Other: Right posterior convexity scalp hematoma measuring up to 4 millimeters in thickness. Underlying calvarium intact. Stable and negative other orbit and scalp soft tissues. CT CERVICAL SPINE FINDINGS Alignment: Reversal of upper cervical lordosis is chronic and appears stable since 2018. Bilateral posterior element alignment is within normal limits. Cervicothoracic junction alignment is within normal limits. Skull base and vertebrae: Visualized skull base is intact. No atlanto-occipital dissociation. Congenital incomplete ossification of the posterior C1 ring. No acute osseous abnormality identified. Soft tissues and spinal canal: No prevertebral fluid or swelling. No visible canal hematoma. Disc levels: Widespread advanced cervical spine disc and endplate degeneration. Progressed left facet degeneration at C2-C3 since 2018. Mild if any associated cervical spinal stenosis. Upper chest: Grossly intact visible upper thoracic levels. Negative lung apices. IMPRESSION: 1. Positive for trace posttraumatic hemorrhage along the right anterior frontal lobe, favor trace subarachnoid over small hemorrhagic contusion. 2. No intracranial mass effect and no other acute traumatic injury to the brain identified. 3. Right  posterior convexity scalp hematoma without underlying fracture. 4. No acute traumatic injury identified in the cervical spine. Electronically Signed: By: Genevie Ann M.D. On: 01/10/2019 09:09   Ct Shoulder Right Wo Contrast  Result Date: 01/10/2019 CLINICAL DATA:  Right shoulder pain after a fall today. Initial encounter. EXAM: CT OF THE UPPER RIGHT EXTREMITY WITHOUT CONTRAST TECHNIQUE: Multidetector  CT imaging of the upper right extremity was performed according to the standard protocol. COMPARISON:  Plain films right shoulder earlier today. FINDINGS: Bones/Joint/Cartilage As seen on the comparison study, the patient has a transverse fracture through the metaphysis of the humerus. The articular surface of the humeral head is rotated medially due to retraction by the rotator cuff. There is mild anterior displacement of the fracture. Nondisplaced component extends into the anterior aspect of the greater tuberosity. The lesser tuberosity is spared. The humeral head is located and the acromioclavicular joint is intact. Also seen are acute fractures of the posterior arcs of the right fifth, sixth, seventh and eighth ribs. No other fracture is identified. Ligaments Suboptimally assessed by CT. Muscles and Tendons Visualization of the rotator cuff is somewhat limited on CT but no fracture seen. Soft tissues There is some hemorrhage in the subacromial/subdeltoid bursa due to the patient's fracture. Small right pleural effusion is noted. No pneumothorax is identified but the anterior aspect of the right lung is seen only near the apex. Imaged lung parenchyma is clear with the exception of mild dependent atelectasis. IMPRESSION: Transverse fracture through the metaphysis of the humerus with mild impaction includes a nondisplaced component in the anterior aspect of the greater tuberosity. The humeral head is rotated medially due to retraction by the rotator cuff. Acute right fifth through eighth rib fractures with an  associated small pleural effusion. No pneumothorax is seen but the anterior margin of the lung is only seen at the apex. Hemorrhage in the subacromial/subdeltoid bursa due to the patient's fracture. Electronically Signed   By: Inge Rise M.D.   On: 01/10/2019 15:34    ASSESSMENT & PLAN:   AML (acute myeloid leukemia) with failed remission ([facility]) #Acute myeloid leukemia with monocytic differentiation [20% blasts on peripheral blood flow cytometry]. Currently on cycle #1- Vidaza + Venatoclax.  Day #28 bone marrow biopsy-  7/14- BMx- NO BLASTS; positive for dysplastic changes.   # S/p Vidaza cycle #2 on 8/17; plus ven 100 mg/day. Today day #8 today-white count 3.8; hemoglobin 6.3 platelets 5. STOP Venatoclax today.  #Proceed with PRBC transfusion/platelet transfusion; reevaluate in 2 days.  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  Awaiting peripheral blood flow cytometry.  Continue prophylactic antibiotics.  # AST elevation- 248-? Etiology; normal ALT/al phos/bilirubin. Will repeat again in 2 days.   #Status post mechanical fall intracranial bleed-minor bleed; status post evaluation at [facility] neurosurgery.  Conservative measures no surgical options.   #Right humeral fracture/rib fracture-conservative measures.  Stable.  # Supportive care:  # Continue Levaquin/Noxafil/acyclovir # PICC dressing weekly.   #Discussed with patient's husband-at length regarding patient's plan of care/labs etc. verbalized understanding to stop venetoclax-under further directions. -------------------------------------------------------------------------------- # DISPOSITION:    # PRBC /platelets today in SDS.  # on 8/26[facility]- labs- cbc /LFts- HOLD tube-possible platelet transfusion  # Follow up with MD- 1 week;  cbc/cmp/LDH- HOLD tube/-Dr.B    Cammie Sickle, MD 02/03/2019 1:17 PM

## 2019-02-04 ENCOUNTER — Other Ambulatory Visit: Payer: Self-pay

## 2019-02-04 ENCOUNTER — Telehealth: Payer: Self-pay | Admitting: *Deleted

## 2019-02-04 ENCOUNTER — Encounter: Payer: Self-pay | Admitting: Internal Medicine

## 2019-02-04 DIAGNOSIS — C9200 Acute myeloblastic leukemia, not having achieved remission: Secondary | ICD-10-CM

## 2019-02-04 LAB — PREPARE PLATELET PHERESIS: Unit division: 0

## 2019-02-04 LAB — BPAM PLATELET PHERESIS
Blood Product Expiration Date: 202008272359
ISSUE DATE / TIME: 202008241417
Unit Type and Rh: 5100

## 2019-02-04 NOTE — Telephone Encounter (Signed)
Spoke to Dr. Myrle Sheng Jones-regarding the CBC done on 8/20-white count 22,000-review of the smear atypical monocytes [did not appear overtly blastic].  Await peripheral blood flow cytometry.   Spoke to husband regarding elevated LFT/AST-recommend discontinuation of Noxafil. Verbalized the understanding; and states that he will call rehab to stop the Noxafil.    Left a message for Dr. Brooke Dare at [facility] discuss patient's care.  Patient follow-up tomorrow/labs.

## 2019-02-04 NOTE — Telephone Encounter (Signed)
Patient's husband called requesting that Dr Rogue Bussing call Dr Tomasa Hosteller at [facility] to consult on [place] than call him back

## 2019-02-05 ENCOUNTER — Other Ambulatory Visit: Payer: Self-pay | Admitting: *Deleted

## 2019-02-05 ENCOUNTER — Telehealth: Payer: Self-pay | Admitting: *Deleted

## 2019-02-05 ENCOUNTER — Other Ambulatory Visit: Payer: Self-pay

## 2019-02-05 ENCOUNTER — Inpatient Hospital Stay: Payer: Medicare Other

## 2019-02-05 ENCOUNTER — Inpatient Hospital Stay: Payer: Medicare Other | Admitting: *Deleted

## 2019-02-05 ENCOUNTER — Inpatient Hospital Stay (HOSPITAL_BASED_OUTPATIENT_CLINIC_OR_DEPARTMENT_OTHER): Payer: Medicare Other | Admitting: Oncology

## 2019-02-05 VITALS — BP 93/59 | HR 71 | Temp 98.1°F | Resp 18

## 2019-02-05 VITALS — BP 111/61 | HR 67 | Temp 97.5°F | Resp 20

## 2019-02-05 DIAGNOSIS — Z5111 Encounter for antineoplastic chemotherapy: Secondary | ICD-10-CM | POA: Diagnosis not present

## 2019-02-05 DIAGNOSIS — D649 Anemia, unspecified: Secondary | ICD-10-CM | POA: Diagnosis not present

## 2019-02-05 DIAGNOSIS — C9200 Acute myeloblastic leukemia, not having achieved remission: Secondary | ICD-10-CM

## 2019-02-05 DIAGNOSIS — E876 Hypokalemia: Secondary | ICD-10-CM

## 2019-02-05 DIAGNOSIS — Z452 Encounter for adjustment and management of vascular access device: Secondary | ICD-10-CM

## 2019-02-05 LAB — COMPREHENSIVE METABOLIC PANEL
ALT: 24 U/L (ref 0–44)
AST: 126 U/L — ABNORMAL HIGH (ref 15–41)
Albumin: 3.4 g/dL — ABNORMAL LOW (ref 3.5–5.0)
Alkaline Phosphatase: 74 U/L (ref 38–126)
Anion gap: 11 (ref 5–15)
BUN: 54 mg/dL — ABNORMAL HIGH (ref 8–23)
CO2: 28 mmol/L (ref 22–32)
Calcium: 9 mg/dL (ref 8.9–10.3)
Chloride: 99 mmol/L (ref 98–111)
Creatinine, Ser: 0.74 mg/dL (ref 0.44–1.00)
GFR calc Af Amer: >60 mL/min (ref >60)
GFR calc non Af Amer: >60 mL/min (ref >60)
Glucose, Bld: 196 mg/dL — ABNORMAL HIGH (ref 70–99)
Potassium: 2.8 mmol/L — ABNORMAL LOW (ref 3.5–5.1)
Sodium: 138 mmol/L (ref 135–145)
Total Bilirubin: 0.9 mg/dL (ref 0.3–1.2)
Total Protein: 5.9 g/dL — ABNORMAL LOW (ref 6.5–8.1)

## 2019-02-05 LAB — CBC WITH DIFFERENTIAL/PLATELET
Abs Immature Granulocytes: 0.1 10*3/uL — ABNORMAL HIGH (ref 0.00–0.07)
Basophils Absolute: 0 10*3/uL (ref 0.0–0.1)
Basophils Relative: 0 %
Eosinophils Absolute: 0 10*3/uL (ref 0.0–0.5)
Eosinophils Relative: 0 %
HCT: 20.3 % — ABNORMAL LOW (ref 36.0–46.0)
Hemoglobin: 6.7 g/dL — ABNORMAL LOW (ref 12.0–15.0)
Immature Granulocytes: 8 %
Lymphocytes Relative: 23 %
Lymphs Abs: 0.3 10*3/uL — ABNORMAL LOW (ref 0.7–4.0)
MCH: 26.2 pg (ref 26.0–34.0)
MCHC: 33 g/dL (ref 30.0–36.0)
MCV: 79.3 fL — ABNORMAL LOW (ref 80.0–100.0)
Monocytes Absolute: 0.5 10*3/uL (ref 0.1–1.0)
Monocytes Relative: 40 %
Neutro Abs: 0.4 10*3/uL — ABNORMAL LOW (ref 1.7–7.7)
Neutrophils Relative %: 29 %
Platelets: 7 10*3/uL — CL (ref 150–400)
RBC: 2.56 MIL/uL — ABNORMAL LOW (ref 3.87–5.11)
RDW: 20.9 % — ABNORMAL HIGH (ref 11.5–15.5)
Smear Review: DECREASED
WBC: 1.3 10*3/uL — CL (ref 4.0–10.5)
nRBC: 7.9 % — ABNORMAL HIGH (ref 0.0–0.2)

## 2019-02-05 LAB — PREPARE RBC (CROSSMATCH)

## 2019-02-05 LAB — LACTATE DEHYDROGENASE: LDH: 2761 U/L — ABNORMAL HIGH (ref 98–192)

## 2019-02-05 MED ORDER — POLYETHYLENE GLYCOL 3350 17 G PO PACK: 17.0000 g | PACK | Freq: Two times a day (BID) | ORAL | 0 refills | Status: DC

## 2019-02-05 MED ORDER — SODIUM CHLORIDE 0.9 % IV SOLN
INTRAVENOUS | Status: AC
  Administered 2019-02-05: 11:00:00 via INTRAVENOUS
  Filled 2019-02-05: qty 250

## 2019-02-05 MED ORDER — POTASSIUM CHLORIDE CRYS ER 20 MEQ PO TBCR: 20.0000 meq | EXTENDED_RELEASE_TABLET | Freq: Two times a day (BID) | ORAL | 0 refills | Status: DC

## 2019-02-05 MED ORDER — SODIUM CHLORIDE 0.9% FLUSH
10.0000 mL | Freq: Once | INTRAVENOUS | Status: AC
  Administered 2019-02-05: 10 mL via INTRAVENOUS
  Filled 2019-02-05: qty 10

## 2019-02-05 MED ORDER — SENNA 8.6 MG PO TABS: 1.0000 | ORAL_TABLET | Freq: Two times a day (BID) | ORAL | 0 refills | Status: DC | PRN

## 2019-02-05 MED ORDER — DIPHENHYDRAMINE HCL 25 MG PO CAPS
25.0000 mg | ORAL_CAPSULE | Freq: Once | ORAL | Status: AC
  Administered 2019-02-05: 25 mg via ORAL
  Filled 2019-02-05: qty 1

## 2019-02-05 MED ORDER — POTASSIUM CHLORIDE 20 MEQ/100ML IV SOLN
20.0000 meq | Freq: Once | INTRAVENOUS | Status: AC
  Administered 2019-02-05: 20 meq via INTRAVENOUS

## 2019-02-05 MED ORDER — ACETAMINOPHEN 325 MG PO TABS
650.0000 mg | ORAL_TABLET | Freq: Once | ORAL | Status: AC
  Administered 2019-02-05: 650 mg via ORAL
  Filled 2019-02-05: qty 2

## 2019-02-05 MED ORDER — HEPARIN SOD (PORK) LOCK FLUSH 100 UNIT/ML IV SOLN
500.0000 [IU] | Freq: Once | INTRAVENOUS | Status: AC
  Administered 2019-02-05: 17:00:00 500 [IU] via INTRAVENOUS
  Filled 2019-02-05: qty 5

## 2019-02-05 NOTE — Progress Notes (Signed)
Symptom Management Consult note [facility]  [phone] [phone]  Patient Care Team: Rusty Aus, MD as PCP - General (Internal Medicine)   Name of the patient: Kathryn Hahn  [id number]  1934-01-06   Date of visit: 02/05/2019   Diagnosis- AML  Chief complaint/ Reason for visit- weakness/fatigue  Heme/Onc history:  Oncology History Overview Note  # SEP 2017- MYELOPROLIFERATIVE NEOPLASM- [WBC- 53; normal Hb/platelets] hypercellular bone marrow 90-95% proliferation of myeloid cells in various stages of maturation; relative erythroid hypoplasia and proliferation of atypical megakaryocytes; no increase in blasts; peripheral blood Bcr-Abl-NEG; Cytogenetics- WNL. NEG- Jak-2/MPL/CALR [place] limited- mild splenomegaly [~10cm; 463cm3]; OCT 2017- second opinion at [place]. Surveillance.   # With progressive leukocytosis we evaluated her a month ago and sent BM for exam. She has a progressive leukocytosis so hydrea '500mg'$  daily was started on 10/20/2016 and increased to 2gm daily then back down to one daily, then 5 days per week over time due to counts drop. Then we held her hydrea since 04/09/2018 due to continued declining Hb and Plt.s  BMB 06/24/18 showed markedly hypercellular BM 95% with myeloid hyperplasia and atypical megakaryocytic hyperplasia. No significant increase in blasts. 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 BCR / ABL negative, FISH normal. Flow Showed 2%CD34-positive myeloid blasts. Myeloid precursors with low side scatter. Pathogenic variants were detected in the ASXL1, CCND2, CUX1, and U2AF1 genes.  # March 2020- wbc- 14/ Hb 9.5/ platelets- 54.   ---------------------------------------------------   # June 8th 2020- Acute myeloid leukemia [peripheral blood flow cytometry;NGS- pending]- June 15th Vidaza [SQ 1-5 + Venatoclax- #1in pt]; tumor lysis prophylaxis  # Day  #28 bone marrow biopsy-  7/14- BMx- NO BLASTS; positive for dysplastic changes. Currently off Venatoclax [since 7/17] sec to cytopenias.  # aug 3rd 2020-    # 2002 [[place]] BREAST CA s/p Lumpect RT; [? Stage I] no chemo s/p AI  # F-ONE HEM: FLT3 ALTERATION NOTED  DIAGNOSIS: ACUTE MYELOID LEUKEMIA GOALS: pallaitive  CURRENT/MOST RECENT THERAPY : Vidaza+ venotoclax.      MDS (myelodysplastic syndrome) ([place]) (Resolved)  08/28/2018 Initial Diagnosis   MDS (myelodysplastic syndrome) (HCC)   AML (acute myeloid leukemia) with failed remission ([place])  11/25/2018 Initial Diagnosis   AML (acute myeloid leukemia) with failed remission ([place])   11/25/2018 - 12/22/2018 Chemotherapy   The patient had azaCITIDine (VIDAZA) chemo injection 105 mg, 75 mg/m2 = 105 mg, Subcutaneous,  Once, 1 of 4 cycles Administration: 105 mg (11/25/2018), 105 mg (11/26/2018), 105 mg (11/27/2018), 105 mg (11/28/2018), 105 mg (11/29/2018)  for chemotherapy treatment.    01/27/2019 -  Chemotherapy   The patient had palonosetron (ALOXI) injection 0.25 mg, 0.25 mg, Intravenous,  Once, 1 of 4 cycles Administration: 0.25 mg (01/27/2019), 0.25 mg (01/29/2019), 0.25 mg (01/31/2019) azaCITIDine (VIDAZA) 100 mg in sodium chloride 0.9 % 50 mL chemo infusion, 75 mg/m2 = 100 mg, Intravenous, Once, 1 of 4 cycles Administration: 100 mg (01/27/2019), 100 mg (01/28/2019), 100 mg (01/29/2019), 100 mg (01/30/2019), 100 mg (01/31/2019)  for chemotherapy treatment.       Interval history-patient presents today for complaints of weakness and fatigue likely due to dehydration and declining blood counts.  She was evaluated last by Dr. Rogue Bussing for follow-up after recently receiving cycle 2 Vidaza.  She was found to be anemic 6.3, a platelet count of 5000 and a white count of 3.8.  She was instructed to stop venetoclax due to worsening pancytopenia.  She  was given 1 unit packed red blood cells and 1 unit platelets.  Her white count continued to trend up  thought to be due to recurrence of leukemia.  She was instructed to continue prophylactic antibiotics with Levaquin.  Her AST was noted to be significantly elevated.  Unknown etiology.  She was instructed to stop Noxafil and return to clinic in 2 days for repeat lab work.  In the interim, she states she has worsening fatigue, weakness and constipation.  She is currently staying at the [facility] at [facility].  Previously lived independently with her husband in an apartment at the [place] but fell about 3 weeks ago fracturing her right humerus and ribs and causing an intracranial bleed status post evaluation at [facility] neurosurgery.  Conservative measures for both no surgical options at this time.  Last night, she required an enema due to no bowel movement for about 5 days.  They were successful.  She is not eating well.  She is drinking approximately 2 bottles of water, 1 Ensure and very small meals throughout the day.  She continues to lose weight.  Her current bowel regimen includes Senokot 1 tablet in the morning and 2 at bedtime.  She was previously taking MiraLAX but is not taking that right now.  She denies any abdominal pain but admits to hard small stools.  She is weak and fatigued all the time and typically declines physical therapy/Occupational Therapy at the facility.  She denies any fevers or illnesses, easy bleeding or bruising.  She denies chest pain, nausea or vomiting, diarrhea or shortness of breath.  ECOG FS:3 - Symptomatic, >50% confined to bed  Review of systems- Review of Systems  Constitutional: Positive for malaise/fatigue and weight loss. Negative for chills and fever.  HENT: Negative for congestion, ear pain and tinnitus.   Eyes: Negative.  Negative for blurred vision and double vision.  Respiratory: Negative.  Negative for cough, sputum production and shortness of breath.   Cardiovascular: Positive for leg swelling. Negative for chest pain and  palpitations.  Gastrointestinal: Positive for abdominal pain and constipation. Negative for diarrhea, nausea and vomiting.  Genitourinary: Negative for dysuria, frequency and urgency.  Musculoskeletal: Positive for joint pain (Right shoulder d/t injury). Negative for back pain and falls.  Skin: Negative.  Negative for rash.  Neurological: Positive for dizziness and weakness. Negative for headaches.  Endo/Heme/Allergies: Negative.  Does not bruise/bleed easily.  Psychiatric/Behavioral: Negative.  Negative for depression. The patient is not nervous/anxious and does not have insomnia.      Current treatment-status post 2 cycles of Vidaza plus venetoclax.  Last given 01/27/2019 through 01/31/2019.  She did not receive Granix.   Allergies  Allergen Reactions   Terbinafine Rash and Swelling     Past Medical History:  Diagnosis Date   Anemia    Arthritis    Breast cancer ([place]) 2003   RT LUMPECTOMY   Edema leg    History of breast cancer 2003   post lumpectomy   History of kidney stones    Hyperlipemia, mixed    Hypertension, essential    Leukemia ([place])    Leukocytosis    MDS (myelodysplastic syndrome) ([place])    Personal history of radiation therapy 2003   BREAST CA   Renal stones    Vitamin D deficiency      Past Surgical History:  Procedure Laterality Date   BREAST BIOPSY Right 2003   Postive for cancer   BREAST LUMPECTOMY Right  2003   BREAST CA   KIDNEY STONE SURGERY Right     Social History   Socioeconomic History   Marital status: Married    Spouse name: Not on file   Number of children: Not on file   Years of education: Not on file   Highest education level: Not on file  Occupational History   Not on file  Social Needs   Financial resource strain: Not on file   Food insecurity    Worry: Not on file    Inability: Not on file   Transportation needs    Medical: Not on file    Non-medical: Not on file  Tobacco Use   Smoking status:  Never Smoker   Smokeless tobacco: Never Used  Substance and Sexual Activity   Alcohol use: Yes    Alcohol/week: 3.0 standard drinks    Types: 3 Glasses of wine per week   Drug use: No   Sexual activity: Not on file  Lifestyle   Physical activity    Days per week: Not on file    Minutes per session: Not on file   Stress: Not on file  Relationships   Social connections    Talks on phone: Not on file    Gets together: Not on file    Attends religious service: Not on file    Active member of club or organization: Not on file    Attends meetings of clubs or organizations: Not on file    Relationship status: Not on file   Intimate partner violence    Fear of current or ex partner: Not on file    Emotionally abused: Not on file    Physically abused: Not on file    Forced sexual activity: Not on file  Other Topics Concern   Not on file  Social History Narrative    No smoking/alcohol; lives in [place] with family.  She lives in assisted living with her husband.    Family History  Problem Relation Age of Onset   Stroke Mother    Hypertension Father    Stroke Father    Breast cancer Neg Hx      Current Outpatient Medications:    acyclovir (ZOVIRAX) 400 MG tablet, One pill a day [to prevent shingles], Disp: 30 tablet, Rfl: 3   atenolol (TENORMIN) 50 MG tablet, Take 1 tablet by mouth 2 (two) times daily., Disp: , Rfl:    calcium carbonate (OSCAL) 1500 (600 Ca) MG TABS tablet, Take 600 mg of elemental calcium by mouth daily with breakfast., Disp: , Rfl:    Cholecalciferol (VITAMIN D) 2000 units tablet, Take 1 tablet by mouth daily., Disp: , Rfl:    levETIRAcetam (KEPPRA) 250 MG tablet, Take 1 tablet (250 mg total) by mouth every morning., Disp: 2 tablet, Rfl: 0   levofloxacin (LEVAQUIN) 250 MG tablet, Take 1 tablet (250 mg total) by mouth daily., Disp: 30 tablet, Rfl: 3   losartan-hydrochlorothiazide (HYZAAR) 50-12.5 MG tablet, Take 1 tablet by mouth daily.,  Disp: , Rfl:    magic mouthwash w/lidocaine SOLN, Take 5 mLs by mouth 4 (four) times daily as needed for mouth pain., Disp: 480 mL, Rfl: 3   oxycodone (OXY-IR) 5 MG capsule, Take 1 capsule (5 mg total) by mouth every 4 (four) hours as needed., Disp: 15 capsule, Rfl: 0   posaconazole (NOXAFIL) 100 MG TBEC delayed-release tablet, Take 2 tablets (200 mg total) by mouth daily. DO NOT START UNTIL-OK with MD., Disp: 60 tablet, Rfl:  3   potassium chloride SA (KLOR-CON M20) 20 MEQ tablet, TAKE 1 TABLET BY MOUTH TWICE A DAY (Patient taking differently: Take 20 mEq by mouth daily. ), Disp: 30 tablet, Rfl: 3   senna (SENOKOT) 8.6 MG tablet, Take 2 tablets by mouth 2 (two) times daily., Disp: , Rfl:    polyethylene glycol (MIRALAX / GLYCOLAX) 17 g packet, Take 17 g by mouth daily., Disp: , Rfl:    potassium chloride SA (K-DUR) 20 MEQ tablet, Take 1 tablet (20 mEq total) by mouth 2 (two) times daily., Disp: 10 tablet, Rfl: 0 No current facility-administered medications for this visit.   Facility-Administered Medications Ordered in Other Visits:    0.9 %  sodium chloride infusion, , Intravenous, Continuous, Kindrick Lankford E, NP, Last Rate: 10 mL/hr at 02/05/19 1059   acetaminophen (TYLENOL) tablet 650 mg, 650 mg, Oral, Once, Signe Tackitt, Wandra Feinstein, NP   diphenhydrAMINE (BENADRYL) capsule 25 mg, 25 mg, Oral, Once, Raphael Espe E, NP   potassium chloride 20 mEq in 100 mL IVPB, 20 mEq, Intravenous, Once, Faythe Casa E, NP, Last Rate: 100 mL/hr at 02/05/19 1100, 20 mEq at 02/05/19 1100  Physical exam:  Vitals:   02/05/19 0947  BP: (!) 93/59  Pulse: 71  Resp: 18  Temp: 98.1 F (36.7 C)  TempSrc: Tympanic   Physical Exam Constitutional:      Appearance: Normal appearance. She is cachectic. She is ill-appearing.  HENT:     Head: Normocephalic and atraumatic.  Eyes:     Pupils: Pupils are equal, round, and reactive to light.  Neck:     Musculoskeletal: Normal range of motion.    Cardiovascular:     Rate and Rhythm: Normal rate and regular rhythm.     Heart sounds: Normal heart sounds. No murmur.     Comments: TED hose Pulmonary:     Effort: Pulmonary effort is normal.     Breath sounds: Normal breath sounds. No wheezing.  Abdominal:     General: Bowel sounds are normal. There is no distension.     Palpations: Abdomen is soft.     Tenderness: There is no abdominal tenderness.     Comments: Abdominal pain improved since having BM-last night  Musculoskeletal: Normal range of motion.     Right shoulder: She exhibits tenderness.     Right lower leg: Edema present.     Left lower leg: Edema present.     Comments: Injury from fall- sling in place  Skin:    General: Skin is warm and dry.     Coloration: Skin is pale.     Findings: No rash.  Neurological:     Mental Status: She is alert and oriented to person, place, and time.  Psychiatric:        Judgment: Judgment normal.      CMP Latest Ref Rng & Units 02/05/2019  Glucose 70 - 99 mg/dL 196(H)  BUN 8 - 23 mg/dL 54(H)  Creatinine 0.44 - 1.00 mg/dL 0.74  Sodium 135 - 145 mmol/L 138  Potassium 3.5 - 5.1 mmol/L 2.8(L)  Chloride 98 - 111 mmol/L 99  CO2 22 - 32 mmol/L 28  Calcium 8.9 - 10.3 mg/dL 9.0  Total Protein 6.5 - 8.1 g/dL 5.9(L)  Total Bilirubin 0.3 - 1.2 mg/dL 0.9  Alkaline Phos 38 - 126 U/L 74  AST 15 - 41 U/L 126(H)  ALT 0 - 44 U/L 24   CBC Latest Ref Rng & Units 02/05/2019  WBC 4.0 -  10.5 K/uL 1.3(LL)  Hemoglobin 12.0 - 15.0 g/dL 6.7(L)  Hematocrit 36.0 - 46.0 % 20.3(L)  Platelets 150 - 400 K/uL 7(LL)    No images are attached to the encounter.  Dg Chest 1 View  Result Date: 01/14/2019 CLINICAL DATA:  Hypoxia. EXAM: CHEST  1 VIEW COMPARISON:  01/13/2019. FINDINGS: Left PICC line in stable position. Stable cardiomegaly. Dense bibasilar atelectasis/infiltrates again noted. Small bilateral pleural effusions. Prominent deformity of the right chest with multiple right rib fractures again noted.  Associated right upper pleural thickening is noted. No pneumothorax. Proximal right humeral fracture. Surgical clips right chest. IMPRESSION: 1.  Left PICC line stable position 2.  Stable cardiomegaly. 3. Dense bibasilar atelectasis/infiltrates again noted. Small bilateral pleural effusions again noted. 4. Severe deformity of the right chest with multiple right rib fractures again noted. Associated right upper lateral pleural thickening is noted. No pneumothorax. 5.  Proximal right humeral fracture. Electronically Signed   By: Marcello Moores  Register   On: 01/14/2019 05:36   Dg Chest 1 View  Result Date: 01/13/2019 CLINICAL DATA:  Recent history of fall. Hypoxia. History of breast cancer. History of leukemia. EXAM: CHEST  1 VIEW COMPARISON:  01/10/2019. FINDINGS: PICC line stable position. Mediastinum and hilar structures are stable. Stable cardiomegaly. Dense bibasilar atelectasis/infiltrate noted on today's exam. Small bilateral pleural effusions. Deformity noted of the right chest with multiple right rib fractures again noted. Surgical clips right chest. IMPRESSION: 1.  PICC line in stable position. 2.  Stable cardiomegaly. 3. Dense bibasilar atelectasis/infiltrates noted on today's exam. Small bilateral pleural effusions noted. 4. Deformity noted of the right chest with multiple right rib fractures again noted. No pneumothorax. Electronically Signed   By: Marcello Moores  Register   On: 01/13/2019 12:36   Dg Chest 2 View  Result Date: 01/10/2019 CLINICAL DATA:  RIGHT chest pain following fall.  Initial encounter. EXAM: CHEST - 2 VIEW COMPARISON:  None. FINDINGS: Cardiomegaly noted. A LEFT PICC line with tip overlying the LOWER SVC noted. Mild interstitial opacities bilaterally are identified. Trace pleural effusions are noted. No focal airspace disease or pneumothorax. Fractures of the RIGHT 5th through 8th ribs noted. Mildly impacted RIGHT humeral neck fracture extending into the head is identified. RIGHT  breast/axillary surgical clips noted. IMPRESSION: 1. RIGHT 5th through 8th rib fractures. No evidence of pneumothorax. 2. RIGHT humeral neck/head fracture. 3. Cardiomegaly with mild interstitial opacities of uncertain chronicity. With trace bilateral pleural effusions, these interstitial opacities may represent mild interstitial edema. Electronically Signed   By: Margarette Canada M.D.   On: 01/10/2019 08:43   Dg Shoulder Right  Result Date: 01/10/2019 CLINICAL DATA:  Acute RIGHT shoulder pain following fall. Initial encounter. EXAM: RIGHT SHOULDER - 2+ VIEW COMPARISON:  None. FINDINGS: A mildly impacted humeral neck fracture is noted extending into the head and involving the greater tuberosity. No shoulder dislocation. Fractures of the RIGHT 5th through 8th ribs noted.  No pneumothorax. IMPRESSION: 1. Mildly impacted humeral neck fracture extending into the head and greater tuberosity. 2. RIGHT 5th through 8th rib fractures.  No pneumothorax. Electronically Signed   By: Margarette Canada M.D.   On: 01/10/2019 08:46   Ct Head Wo Contrast  Result Date: 01/10/2019 CLINICAL DATA:  Per neurosurgery repeat CT scan of the head in 6 hours. Pt ems from [facility] s/p fall mechanical pt c/o of rt shoulder pain. EXAM: CT HEAD WITHOUT CONTRAST TECHNIQUE: Contiguous axial images were obtained from the base of the skull through the vertex without intravenous contrast.  COMPARISON:  01/10/2019 at 8:30 a.m. FINDINGS: Brain: No change in the small focus of hemorrhage noted along the anterior right frontal lobe. No new intracranial hemorrhage. No hydrocephalus or evidence of an ischemic infarct. Atrophy and mild chronic microvascular ischemic change is stable from the earlier exam. Vascular: No hyperdense vessel or unexpected calcification. Skull: Normal. Negative for fracture or focal lesion. Sinuses/Orbits: No acute finding. Other: None. IMPRESSION: 1. No change from the study obtained earlier today. 2. Trace posttraumatic  hemorrhage along the anterior right frontal lobe. No new hemorrhage. Electronically Signed   By: Lajean Manes M.D.   On: 01/10/2019 15:30   Ct Head Wo Contrast  Addendum Date: 01/10/2019   ADDENDUM REPORT: 01/10/2019 09:31 ADDENDUM: Study discussed by telephone with Dr. Conni Slipper on 01/10/2019 at 0921 hours. Electronically Signed   By: Genevie Ann M.D.   On: 01/10/2019 09:31   Result Date: 01/10/2019 CLINICAL DATA:  85 year old female status post fall this morning. Right orbits/frontal injury. Neck pain. EXAM: CT HEAD WITHOUT CONTRAST CT CERVICAL SPINE WITHOUT CONTRAST TECHNIQUE: Multidetector CT imaging of the head and cervical spine was performed following the standard protocol without intravenous contrast. Multiplanar CT image reconstructions of the cervical spine were also generated. COMPARISON:  CTA head 01/10/2017. CT face 01/09/2017. FINDINGS: CT HEAD FINDINGS Brain: Stable cerebral volume, normal for age. No midline shift, mass effect, or evidence of intracranial mass lesion. No ventriculomegaly. No intraventricular hemorrhage identified. Trace acute hemorrhage along the right anterior frontal convexity on series 2, image 13 appears to be subarachnoid rather than a hemorrhagic cerebral contusion. No other acute intracranial hemorrhage identified. Stable gray-white matter differentiation throughout the brain. No cortically based acute infarct identified. No cortical encephalomalacia. Normal basilar cisterns. Vascular: Calcified atherosclerosis at the skull base. No suspicious intracranial vascular hyperdensity. Skull: Intact. Sinuses/Orbits: Trace to mild paranasal sinus mucosal thickening is new since 2018. Tympanic cavities and mastoids remain clear. Other: Right posterior convexity scalp hematoma measuring up to 4 millimeters in thickness. Underlying calvarium intact. Stable and negative other orbit and scalp soft tissues. CT CERVICAL SPINE FINDINGS Alignment: Reversal of upper cervical lordosis is  chronic and appears stable since 2018. Bilateral posterior element alignment is within normal limits. Cervicothoracic junction alignment is within normal limits. Skull base and vertebrae: Visualized skull base is intact. No atlanto-occipital dissociation. Congenital incomplete ossification of the posterior C1 ring. No acute osseous abnormality identified. Soft tissues and spinal canal: No prevertebral fluid or swelling. No visible canal hematoma. Disc levels: Widespread advanced cervical spine disc and endplate degeneration. Progressed left facet degeneration at C2-C3 since 2018. Mild if any associated cervical spinal stenosis. Upper chest: Grossly intact visible upper thoracic levels. Negative lung apices. IMPRESSION: 1. Positive for trace posttraumatic hemorrhage along the right anterior frontal lobe, favor trace subarachnoid over small hemorrhagic contusion. 2. No intracranial mass effect and no other acute traumatic injury to the brain identified. 3. Right posterior convexity scalp hematoma without underlying fracture. 4. No acute traumatic injury identified in the cervical spine. Electronically Signed: By: Genevie Ann M.D. On: 01/10/2019 09:09   Ct Cervical Spine Wo Contrast  Addendum Date: 01/10/2019   ADDENDUM REPORT: 01/10/2019 09:31 ADDENDUM: Study discussed by telephone with Dr. Conni Slipper on 01/10/2019 at 0921 hours. Electronically Signed   By: Genevie Ann M.D.   On: 01/10/2019 09:31   Result Date: 01/10/2019 CLINICAL DATA:  85 year old female status post fall this morning. Right orbits/frontal injury. Neck pain. EXAM: CT HEAD WITHOUT CONTRAST CT CERVICAL SPINE WITHOUT CONTRAST  TECHNIQUE: Multidetector CT imaging of the head and cervical spine was performed following the standard protocol without intravenous contrast. Multiplanar CT image reconstructions of the cervical spine were also generated. COMPARISON:  CTA head 01/10/2017. CT face 01/09/2017. FINDINGS: CT HEAD FINDINGS Brain: Stable cerebral volume,  normal for age. No midline shift, mass effect, or evidence of intracranial mass lesion. No ventriculomegaly. No intraventricular hemorrhage identified. Trace acute hemorrhage along the right anterior frontal convexity on series 2, image 13 appears to be subarachnoid rather than a hemorrhagic cerebral contusion. No other acute intracranial hemorrhage identified. Stable gray-white matter differentiation throughout the brain. No cortically based acute infarct identified. No cortical encephalomalacia. Normal basilar cisterns. Vascular: Calcified atherosclerosis at the skull base. No suspicious intracranial vascular hyperdensity. Skull: Intact. Sinuses/Orbits: Trace to mild paranasal sinus mucosal thickening is new since 2018. Tympanic cavities and mastoids remain clear. Other: Right posterior convexity scalp hematoma measuring up to 4 millimeters in thickness. Underlying calvarium intact. Stable and negative other orbit and scalp soft tissues. CT CERVICAL SPINE FINDINGS Alignment: Reversal of upper cervical lordosis is chronic and appears stable since 2018. Bilateral posterior element alignment is within normal limits. Cervicothoracic junction alignment is within normal limits. Skull base and vertebrae: Visualized skull base is intact. No atlanto-occipital dissociation. Congenital incomplete ossification of the posterior C1 ring. No acute osseous abnormality identified. Soft tissues and spinal canal: No prevertebral fluid or swelling. No visible canal hematoma. Disc levels: Widespread advanced cervical spine disc and endplate degeneration. Progressed left facet degeneration at C2-C3 since 2018. Mild if any associated cervical spinal stenosis. Upper chest: Grossly intact visible upper thoracic levels. Negative lung apices. IMPRESSION: 1. Positive for trace posttraumatic hemorrhage along the right anterior frontal lobe, favor trace subarachnoid over small hemorrhagic contusion. 2. No intracranial mass effect and no other  acute traumatic injury to the brain identified. 3. Right posterior convexity scalp hematoma without underlying fracture. 4. No acute traumatic injury identified in the cervical spine. Electronically Signed: By: Genevie Ann M.D. On: 01/10/2019 09:09   Ct Shoulder Right Wo Contrast  Result Date: 01/10/2019 CLINICAL DATA:  Right shoulder pain after a fall today. Initial encounter. EXAM: CT OF THE UPPER RIGHT EXTREMITY WITHOUT CONTRAST TECHNIQUE: Multidetector CT imaging of the upper right extremity was performed according to the standard protocol. COMPARISON:  Plain films right shoulder earlier today. FINDINGS: Bones/Joint/Cartilage As seen on the comparison study, the patient has a transverse fracture through the metaphysis of the humerus. The articular surface of the humeral head is rotated medially due to retraction by the rotator cuff. There is mild anterior displacement of the fracture. Nondisplaced component extends into the anterior aspect of the greater tuberosity. The lesser tuberosity is spared. The humeral head is located and the acromioclavicular joint is intact. Also seen are acute fractures of the posterior arcs of the right fifth, sixth, seventh and eighth ribs. No other fracture is identified. Ligaments Suboptimally assessed by CT. Muscles and Tendons Visualization of the rotator cuff is somewhat limited on CT but no fracture seen. Soft tissues There is some hemorrhage in the subacromial/subdeltoid bursa due to the patient's fracture. Small right pleural effusion is noted. No pneumothorax is identified but the anterior aspect of the right lung is seen only near the apex. Imaged lung parenchyma is clear with the exception of mild dependent atelectasis. IMPRESSION: Transverse fracture through the metaphysis of the humerus with mild impaction includes a nondisplaced component in the anterior aspect of the greater tuberosity. The humeral head is rotated medially due  to retraction by the rotator cuff. Acute  right fifth through eighth rib fractures with an associated small pleural effusion. No pneumothorax is seen but the anterior margin of the lung is only seen at the apex. Hemorrhage in the subacromial/subdeltoid bursa due to the patient's fracture. Electronically Signed   By: Inge Rise M.D.   On: 01/10/2019 15:34   Assessment and plan- Patient is a 85 y.o. female who presents to [facility] for fatigue and weakness d/t dehydration and pancytopenia.  She also complains of constipation requiring an enema last night.  AML: Recently diagnosed approximately 3 months ago after PCP noticed progressive leukocytosis who subsequently was sent for a bone marrow.  She was started on Hydrea 500 mg daily in May 2018 until blood counts started to drop.  Bone marrow did not reveal significant increase in blasts.  Diagnosis was favored to be myelodysplastic or myeloproliferative neoplasm.  Counts continue to decline.  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  Had bone marrow biopsy on 12/24/2018 which revealed no blasts but was positive for dysplastic changes.  Her last treatment was 1 week ago where she received cycle 2 Vidaza and instructed to stop venetoclax due to cytopenias.  She was given 1 unit packed red blood cells and 1 unit platelets.  Continue to hold venetoclax and Noxafil.   Neutropenia: ANC 400.  Due to recent Vidaza infusion.  Continue to hold venetoclax.  Repeat labs Friday.  Required Granix during first cycle of Vidaza.  Did not receive it with cycle 2.  Elevated AST: 248 2 days ago. Improving-126.  Continue to hold Noxafil.  Repeat labs Friday.  Fatigue/weakness: Likely multifactorial.  She is hypotensive, anemic, low platelet count and hypokalemic.  Disease is also contributory.  See plan below.  Hypokalemia: We will replace see below. RX potassium 20 Meq BID x 5 days.   Leukocytosis: Do not suspect infection.  This is  likely recurrence of disease.  Currently on prophylactic antibiotics.  Continue at this time.  Plan: Stat labs. (Anemia hemoglobin 6.7, LDH 2761, platelets 7000, ANC 400, K2.8, BUN 54, AST 126) Stat vital signs.  Hypotensive 93/59.  Heart rate 71. Give 20 mEq of potassium IV. Give 1 unit irradiated packed red blood cells. Give 1 unit platelets.  Give 0.5 L Nacl.  RX 20 mEq BID X 5 days.   Disposition: RTC on Friday for more lab work and possible IV fluids/IV potassium/PRBC/platelets. RTC on Monday as scheduled to be seen by Dr. Rogue Bussing.  Start oral Potassium as prescribed. Continue prophylactic Levaquin.    Visit Diagnosis 1. Symptomatic anemia   2. AML (acute myeloid leukemia) with failed remission ([place])     Patient expressed understanding and was in agreement with this plan. She also understands that She can call clinic at any time with any questions, concerns, or complaints.   Greater than 50% was spent in counseling and coordination of care with this patient including but not limited to discussion of the relevant topics above (See A&P) including, but not limited to diagnosis and management of acute and chronic medical conditions.   Thank you for allowing me to participate in the care of this very pleasant patient.   Jacquelin Hawking, NP [place] [facility] Cell - [phone] Pager- [phone] 02/05/2019 11:48 AM

## 2019-02-05 NOTE — Telephone Encounter (Signed)
Nurse from the [facility] at [place] called asking for orders to discontinue Noxofil 100 mg medicine per husband telling them Dr B said to stop it.

## 2019-02-05 NOTE — Telephone Encounter (Signed)
Patient seen in the infusion area-getting PRBC platelet transfusion.  Also hypokalemia-potassium supplementation. Also reviewed elevated LFTs-AST 248 previously currently 126 continue to hold posaconazole.  Discussed with Faythe Casa.  #I had a long discussion with patient regarding the difficult situation-likely acute leukemia relapsed [based upon elevated white count 24,000 on August 20th].  Discussed with Dr. Allena Napoleon pathology-review of smear suggestive of atypical monocytes/not obvious blasts.  Peripheral blood flow cytometry from the 24th is pending.  Discussed that current severe anemia/thrombocytopenia likely secondary to treatment/venetoclax plus Vidaza.  Venetoclax on hold.-LDH elevated 2000+; likely secondary effect of venetoclax on leukemic cells.  #Discussed at length the plan of care with Dr. Brooke Dare at [facility].  Agrees with the current management.  Also reviewed the [facility]-faxed copy of the report to [facility] at [phone].   # I discussed the patient husband regarding above concerns.  Also discussed my discussion with Dr. at [facility]  #I had more than 25 minutes phone call the patient's brother-again reviewing the above plan of care multiple questions were asked and answered to the best of my knowledge.  Discussed overall poor prognosis given the rapid recurrence of leukemia; also given the interruptions of therapy; declining performance status etc.  Patient's brother seems to be unhappy/concern to the patient's difficult clinical situation.  I offered transfer of care to [facility] if the patient is interested.  He states that he will talk to his family about it.

## 2019-02-05 NOTE — Telephone Encounter (Signed)
VERBAL ORDER called to Bett at [facility], she said she does not need written order

## 2019-02-05 NOTE — Patient Instructions (Addendum)
I am so sorry you are not feeling well today.  Today we drew labs and you received 1 unit of blood and 1 unit of platelets.  You also received 20 mEq of potassium through your PICC line.  Your labs are as followed: Lab Results  Component Value Date   WBC 1.3 (LL) 02/05/2019   HGB 6.7 (L) 02/05/2019   HCT 20.3 (L) 02/05/2019   MCV 79.3 (L) 02/05/2019   PLT 7 (LL) 02/05/2019     Chemistry      Component Value Date/Time   NA 138 02/05/2019 0920   K 2.8 (L) 02/05/2019 0920   CL 99 02/05/2019 0920   CO2 28 02/05/2019 0920   BUN 54 (H) 02/05/2019 0920   CREATININE 0.74 02/05/2019 0920      Component Value Date/Time   CALCIUM 9.0 02/05/2019 0920   ALKPHOS 74 02/05/2019 0920   AST 126 (H) 02/05/2019 0920   ALT 24 02/05/2019 0920   BILITOT 0.9 02/05/2019 0920      I have also sent a prescription to your pharmacy for potassium x5 days.  You will take 1 in the morning and 1 in the evening.   Dr. Rogue Bussing would like for you to continue to hold your venetoclax and Noxafil.  Your venetoclax is held due to low white blood cell counts and your Noxafil is being held due to elevated liver function (AST 126).  This actually has improved significantly from your appointment on Monday after stopping your Noxafil.  Dr. Rogue Bussing will reevaluate you on Monday, 02/10/2019.   I also think you should come back to clinic on Friday to have repeat labs drawn and possibly receive IV fluids/blood/platelets or additional potassium.  As far as her constipation is concerned, I think he should try MiraLAX once in the morning and once in the evening along with your Senokot.  See if this can regulate your bowels some.  If you begin to have diarrhea cut back on your Senokot.  Also you need to increase your fluid intake.  I know it is tough to drink a ton of water but try to double what you are currently drinking.  I will give you some supplements to help with fluid intake.  1 of them is Ensure rapid hydration  electrolyte powder which tends to work pretty good.  We will recheck all of your labs on Friday.  Please call us for concerns or if you develop a fever, shortness of breath or cough.  Faythe Casa, NP 02/05/2019 12:02 PM

## 2019-02-05 NOTE — Telephone Encounter (Signed)
Per Dr. Sharmaine Base phone note yesterday, the Noxofil can be stopped. Does the facility need a written order vs verbal? If written order is needed, please provide fax #

## 2019-02-06 ENCOUNTER — Other Ambulatory Visit: Payer: Self-pay

## 2019-02-06 ENCOUNTER — Other Ambulatory Visit: Payer: Self-pay | Admitting: *Deleted

## 2019-02-06 DIAGNOSIS — C9200 Acute myeloblastic leukemia, not having achieved remission: Secondary | ICD-10-CM

## 2019-02-06 DIAGNOSIS — D649 Anemia, unspecified: Secondary | ICD-10-CM

## 2019-02-06 LAB — COMP PANEL: LEUKEMIA/LYMPHOMA

## 2019-02-06 LAB — TYPE AND SCREEN
ABO/RH(D): O POS
Antibody Screen: NEGATIVE
Unit division: 0
Unit division: 0

## 2019-02-06 LAB — PREPARE PLATELET PHERESIS: Unit division: 0

## 2019-02-06 LAB — BPAM PLATELET PHERESIS
Blood Product Expiration Date: 202008282359
ISSUE DATE / TIME: 202008261252
Unit Type and Rh: 5100

## 2019-02-06 LAB — BPAM RBC
Blood Product Expiration Date: 202008282359
Blood Product Expiration Date: 202009182359
ISSUE DATE / TIME: 202008241218
ISSUE DATE / TIME: 202008261354
Unit Type and Rh: 5100
Unit Type and Rh: 9500

## 2019-02-07 ENCOUNTER — Other Ambulatory Visit: Payer: Self-pay | Admitting: *Deleted

## 2019-02-07 ENCOUNTER — Inpatient Hospital Stay (HOSPITAL_BASED_OUTPATIENT_CLINIC_OR_DEPARTMENT_OTHER): Payer: Medicare Other | Admitting: Oncology

## 2019-02-07 ENCOUNTER — Other Ambulatory Visit: Payer: Self-pay | Admitting: Oncology

## 2019-02-07 ENCOUNTER — Telehealth: Payer: Self-pay | Admitting: Pharmacist

## 2019-02-07 ENCOUNTER — Inpatient Hospital Stay: Payer: Medicare Other

## 2019-02-07 ENCOUNTER — Telehealth: Payer: Self-pay | Admitting: Pharmacy Technician

## 2019-02-07 ENCOUNTER — Inpatient Hospital Stay: Payer: Medicare Other | Admitting: *Deleted

## 2019-02-07 ENCOUNTER — Telehealth: Payer: Self-pay | Admitting: Internal Medicine

## 2019-02-07 ENCOUNTER — Other Ambulatory Visit: Payer: Self-pay

## 2019-02-07 ENCOUNTER — Encounter: Payer: Self-pay | Admitting: Oncology

## 2019-02-07 ENCOUNTER — Encounter: Payer: Self-pay | Admitting: Internal Medicine

## 2019-02-07 VITALS — BP 135/86 | HR 71 | Temp 97.7°F | Resp 20 | Ht <= 58 in | Wt 99.0 lb

## 2019-02-07 VITALS — BP 107/64 | HR 69 | Temp 97.9°F | Resp 18

## 2019-02-07 DIAGNOSIS — C9200 Acute myeloblastic leukemia, not having achieved remission: Secondary | ICD-10-CM

## 2019-02-07 DIAGNOSIS — D649 Anemia, unspecified: Secondary | ICD-10-CM

## 2019-02-07 DIAGNOSIS — Z95828 Presence of other vascular implants and grafts: Secondary | ICD-10-CM

## 2019-02-07 DIAGNOSIS — Z5111 Encounter for antineoplastic chemotherapy: Secondary | ICD-10-CM | POA: Diagnosis not present

## 2019-02-07 DIAGNOSIS — E876 Hypokalemia: Secondary | ICD-10-CM

## 2019-02-07 LAB — COMPREHENSIVE METABOLIC PANEL
ALT: 23 U/L (ref 0–44)
AST: 60 U/L — ABNORMAL HIGH (ref 15–41)
Albumin: 3.3 g/dL — ABNORMAL LOW (ref 3.5–5.0)
Alkaline Phosphatase: 71 U/L (ref 38–126)
Anion gap: 12 (ref 5–15)
BUN: 35 mg/dL — ABNORMAL HIGH (ref 8–23)
CO2: 27 mmol/L (ref 22–32)
Calcium: 8.9 mg/dL (ref 8.9–10.3)
Chloride: 98 mmol/L (ref 98–111)
Creatinine, Ser: 0.63 mg/dL (ref 0.44–1.00)
GFR calc Af Amer: >60 mL/min (ref >60)
GFR calc non Af Amer: >60 mL/min (ref >60)
Glucose, Bld: 207 mg/dL — ABNORMAL HIGH (ref 70–99)
Potassium: 3 mmol/L — ABNORMAL LOW (ref 3.5–5.1)
Sodium: 137 mmol/L (ref 135–145)
Total Bilirubin: 0.9 mg/dL (ref 0.3–1.2)
Total Protein: 5.9 g/dL — ABNORMAL LOW (ref 6.5–8.1)

## 2019-02-07 LAB — SAMPLE TO BLOOD BANK

## 2019-02-07 LAB — CBC WITH DIFFERENTIAL/PLATELET
Abs Immature Granulocytes: 0 10*3/uL (ref 0.00–0.07)
Basophils Absolute: 0 10*3/uL (ref 0.0–0.1)
Basophils Relative: 0 %
Eosinophils Absolute: 0 10*3/uL (ref 0.0–0.5)
Eosinophils Relative: 0 %
HCT: 24.7 % — ABNORMAL LOW (ref 36.0–46.0)
Hemoglobin: 8 g/dL — ABNORMAL LOW (ref 12.0–15.0)
Lymphocytes Relative: 29 %
Lymphs Abs: 0.3 10*3/uL — ABNORMAL LOW (ref 0.7–4.0)
MCH: 26.5 pg (ref 26.0–34.0)
MCHC: 32.4 g/dL (ref 30.0–36.0)
MCV: 81.8 fL (ref 80.0–100.0)
Metamyelocytes Relative: 1 %
Monocytes Absolute: 0.5 10*3/uL (ref 0.1–1.0)
Monocytes Relative: 40 %
Myelocytes: 2 %
Neutro Abs: 0.4 10*3/uL — ABNORMAL LOW (ref 1.7–7.7)
Neutrophils Relative %: 28 %
Platelets: 9 10*3/uL — CL (ref 150–400)
RBC: 3.02 MIL/uL — ABNORMAL LOW (ref 3.87–5.11)
RDW: 20.2 % — ABNORMAL HIGH (ref 11.5–15.5)
Smear Review: DECREASED
WBC: 1.2 10*3/uL — CL (ref 4.0–10.5)
nRBC: 9.5 % — ABNORMAL HIGH (ref 0.0–0.2)

## 2019-02-07 LAB — LACTATE DEHYDROGENASE: LDH: 1699 U/L — ABNORMAL HIGH (ref 98–192)

## 2019-02-07 LAB — MAGNESIUM: Magnesium: 1.6 mg/dL — ABNORMAL LOW (ref 1.7–2.4)

## 2019-02-07 MED ORDER — SODIUM CHLORIDE 0.9% FLUSH
10.0000 mL | Freq: Once | INTRAVENOUS | Status: AC
  Administered 2019-02-07: 10 mL via INTRAVENOUS
  Filled 2019-02-07: qty 10

## 2019-02-07 MED ORDER — HEPARIN SOD (PORK) LOCK FLUSH 100 UNIT/ML IV SOLN
250.0000 [IU] | INTRAVENOUS | Status: AC | PRN
  Administered 2019-02-07: 250 [IU]
  Filled 2019-02-07: qty 5

## 2019-02-07 MED ORDER — SODIUM CHLORIDE 0.9% IV SOLUTION
250.0000 mL | Freq: Once | INTRAVENOUS | Status: AC
  Administered 2019-02-07: 250 mL via INTRAVENOUS
  Filled 2019-02-07: qty 250

## 2019-02-07 MED ORDER — POTASSIUM CHLORIDE 20 MEQ/100ML IV SOLN
20.0000 meq | Freq: Once | INTRAVENOUS | Status: AC
  Administered 2019-02-07: 20 meq via INTRAVENOUS

## 2019-02-07 MED ORDER — ACETAMINOPHEN 325 MG PO TABS
650.0000 mg | ORAL_TABLET | Freq: Once | ORAL | Status: AC
  Administered 2019-02-07: 650 mg via ORAL
  Filled 2019-02-07: qty 2

## 2019-02-07 MED ORDER — POLYETHYLENE GLYCOL 3350 17 G PO PACK: 17.0000 g | PACK | Freq: Every day | ORAL | 0 refills | Status: DC

## 2019-02-07 MED ORDER — SODIUM CHLORIDE 0.9 % IV SOLN: 2.0000 g | Freq: Once | INTRAVENOUS | Status: DC

## 2019-02-07 MED ORDER — DIPHENHYDRAMINE HCL 25 MG PO CAPS
25.0000 mg | ORAL_CAPSULE | Freq: Once | ORAL | Status: AC
  Administered 2019-02-07: 25 mg via ORAL
  Filled 2019-02-07: qty 1

## 2019-02-07 MED ORDER — XOSPATA 40 MG PO TABS: 80.0000 mg | ORAL_TABLET | Freq: Every day | ORAL | 2 refills | Status: DC

## 2019-02-07 MED ORDER — MAGNESIUM SULFATE 2 GM/50ML IV SOLN
2.0000 g | Freq: Once | INTRAVENOUS | Status: AC
  Administered 2019-02-07: 2 g via INTRAVENOUS
  Filled 2019-02-07: qty 50

## 2019-02-07 NOTE — Telephone Encounter (Signed)
Met with the patient-reviewed the labs; need for platelet transfusion.  We will hold off venetoclax+ Vidaza.  Also discussed with the patient that if they chose to get care at [facility]; is still totally fine with me.  I would be happy to do the best I can for the patient.  Unfortunately prognosis is poor.  Discussed with Dr. Brooke Dare at [facility].  Recommends FLT3 inhibitor.   Discussed with pharmacy, Eb[facility].

## 2019-02-07 NOTE — Telephone Encounter (Signed)
Oral Oncology Patient Advocate Encounter  Received notification from [facility] that prior authorization for Kathryn Hahn is required.  PA submitted on CoverMyMeds Key [id number]  Status is pending  Oral Oncology Clinic will continue to follow.  [name] Phone [phone] Fax [phone] 02/07/2019 3:02 PM

## 2019-02-07 NOTE — Progress Notes (Signed)
Patient is currently in rehab.

## 2019-02-07 NOTE — Progress Notes (Addendum)
Symptom Management Consult note [facility]  Telephone:([phone] [phone]  Patient Care Team: Rusty Aus, MD as PCP - General (Internal Medicine)   Name of the patient: Kathryn Hahn  [id number]  1934/05/18   Date of visit: 02/07/2019   Diagnosis- AML  Chief complaint/ Reason for visit-follow-up with lab work  Heme/Onc history:  Oncology History Overview Note  # SEP 2017- MYELOPROLIFERATIVE NEOPLASM- [WBC- 14; normal Hb/platelets] hypercellular bone marrow 90-95% proliferation of myeloid cells in various stages of maturation; relative erythroid hypoplasia and proliferation of atypical megakaryocytes; no increase in blasts; peripheral blood Bcr-Abl-NEG; Cytogenetics- WNL. NEG- Jak-2/MPL/CALR [place] limited- mild splenomegaly [~10cm; 463cm3]; OCT 2017- second opinion at [place]. Surveillance.   # With progressive leukocytosis we evaluated her a month ago and sent BM for exam. She has a progressive leukocytosis so hydrea '500mg'$  daily was started on 10/20/2016 and increased to 2gm daily then back down to one daily, then 5 days per week over time due to counts drop. Then we held her hydrea since 04/09/2018 due to continued declining Hb and Plt.s  BMB 06/24/18 showed markedly hypercellular BM 95% with myeloid hyperplasia and atypical megakaryocytic hyperplasia. No significant increase in blasts. 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 BCR / ABL negative, FISH normal. Flow Showed 2%CD34-positive myeloid blasts. Myeloid precursors with low side scatter. Pathogenic variants were detected in the ASXL1, CCND2, CUX1, and U2AF1 genes.  # March 2020- wbc- 14/ Hb 9.5/ platelets- 54.   ---------------------------------------------------   # June 8th 2020- Acute myeloid leukemia [peripheral blood flow cytometry;NGS- pending]- June 15th Vidaza [SQ 1-5 + Venatoclax- #1in pt]; tumor lysis  prophylaxis  # Day #28 bone marrow biopsy-  7/14- BMx- NO BLASTS; positive for dysplastic changes. Currently off Venatoclax [since 7/17] sec to cytopenias.  # aug 3rd 2020-    # 2002 [[place]] BREAST CA s/p Lumpect RT; [? Stage I] no chemo s/p AI  # F-ONE HEM: FLT3 ALTERATION NOTED  DIAGNOSIS: ACUTE MYELOID LEUKEMIA GOALS: pallaitive  CURRENT/MOST RECENT THERAPY : Vidaza+ venotoclax.      MDS (myelodysplastic syndrome) ([place]) (Resolved)  08/28/2018 Initial Diagnosis   MDS (myelodysplastic syndrome) (HCC)   AML (acute myeloid leukemia) with failed remission ([place])  11/25/2018 Initial Diagnosis   AML (acute myeloid leukemia) with failed remission ([place])   11/25/2018 - 12/22/2018 Chemotherapy   The patient had azaCITIDine (VIDAZA) chemo injection 105 mg, 75 mg/m2 = 105 mg, Subcutaneous,  Once, 1 of 4 cycles Administration: 105 mg (11/25/2018), 105 mg (11/26/2018), 105 mg (11/27/2018), 105 mg (11/28/2018), 105 mg (11/29/2018)  for chemotherapy treatment.    01/27/2019 -  Chemotherapy   The patient had palonosetron (ALOXI) injection 0.25 mg, 0.25 mg, Intravenous,  Once, 1 of 4 cycles Administration: 0.25 mg (01/27/2019), 0.25 mg (01/29/2019), 0.25 mg (01/31/2019) azaCITIDine (VIDAZA) 100 mg in sodium chloride 0.9 % 50 mL chemo infusion, 75 mg/m2 = 100 mg, Intravenous, Once, 1 of 4 cycles Administration: 100 mg (01/27/2019), 100 mg (01/28/2019), 100 mg (01/29/2019), 100 mg (01/30/2019), 100 mg (01/31/2019)  for chemotherapy treatment.     Interval history-patient presents to symptom management for follow-up of recent visit for weakness and fatigue.  She presented to [facility] on 02/05/2019 for complaints of weakness and fatigue and received 1 unit packed red blood cells 1 unit platelets and 20 mEq potassium IV.  She was started on 20 mEq potassium twice daily x5 days. She was instructed to hold Venetaclax.   Today, she  continues to feel fatigued.  States "she feels a little bit better".  Appetite remains  fair.  She ate raisin Bran this morning.  Weight is stable at 99 pounds.  She denies any fevers or illnesses, easy bleeding or bruising, chest pain, nausea, vomiting, constipation or diarrhea.  She denies any urinary complaints.  Continues to have pain to right shoulder.  Wearing sling.   ECOG FS:3 - Symptomatic, >50% confined to bed  Review of systems- Review of Systems  Constitutional: Positive for malaise/fatigue. Negative for chills, fever and weight loss.  HENT: Negative for congestion, ear pain and tinnitus.   Eyes: Negative.  Negative for blurred vision and double vision.  Respiratory: Negative.  Negative for cough, sputum production and shortness of breath.   Cardiovascular: Negative.  Negative for chest pain, palpitations and leg swelling.  Gastrointestinal: Negative.  Negative for abdominal pain, constipation, diarrhea, nausea and vomiting.  Genitourinary: Negative for dysuria, frequency and urgency.  Musculoskeletal: Negative for back pain and falls.       Right shoulder pain- sling  Skin: Negative.  Negative for rash.  Neurological: Positive for weakness. Negative for headaches.  Endo/Heme/Allergies: Negative.  Does not bruise/bleed easily.  Psychiatric/Behavioral: Negative.  Negative for depression. The patient is not nervous/anxious and does not have insomnia.      Current treatment-s/p  2 cycles of Vidaza/venetoclax.  Last given 01/27/2019 through 01/31/2019.  Veneteclax currently on hold due to pancytopenia.   Allergies  Allergen Reactions   Terbinafine Rash and Swelling     Past Medical History:  Diagnosis Date   Anemia    Arthritis    Breast cancer ([place]) 2003   RT LUMPECTOMY   Edema leg    History of breast cancer 2003   post lumpectomy   History of kidney stones    Hyperlipemia, mixed    Hypertension, essential    Leukemia ([place])    Leukocytosis    MDS (myelodysplastic syndrome) ([place])    Personal history of radiation therapy 2003   BREAST CA    Renal stones    Vitamin D deficiency      Past Surgical History:  Procedure Laterality Date   BREAST BIOPSY Right 2003   Postive for cancer   BREAST LUMPECTOMY Right 2003   BREAST CA   KIDNEY STONE SURGERY Right     Social History   Socioeconomic History   Marital status: Married    Spouse name: Not on file   Number of children: Not on file   Years of education: Not on file   Highest education level: Not on file  Occupational History   Not on file  Social Needs   Financial resource strain: Not on file   Food insecurity    Worry: Not on file    Inability: Not on file   Transportation needs    Medical: Not on file    Non-medical: Not on file  Tobacco Use   Smoking status: Never Smoker   Smokeless tobacco: Never Used  Substance and Sexual Activity   Alcohol use: Yes    Alcohol/week: 3.0 standard drinks    Types: 3 Glasses of wine per week   Drug use: No   Sexual activity: Not on file  Lifestyle   Physical activity    Days per week: Not on file    Minutes per session: Not on file   Stress: Not on file  Relationships   Social connections    Talks on phone: Not on file  Gets together: Not on file    Attends religious service: Not on file    Active member of club or organization: Not on file    Attends meetings of clubs or organizations: Not on file    Relationship status: Not on file   Intimate partner violence    Fear of current or ex partner: Not on file    Emotionally abused: Not on file    Physically abused: Not on file    Forced sexual activity: Not on file  Other Topics Concern   Not on file  Social History Narrative    No smoking/alcohol; lives in [place] with family.  She lives in assisted living with her husband.    Family History  Problem Relation Age of Onset   Stroke Mother    Hypertension Father    Stroke Father    Breast cancer Neg Hx      Current Outpatient Medications:    acetaminophen (TYLENOL)  325 MG tablet, Take by mouth., Disp: , Rfl:    acyclovir (ZOVIRAX) 400 MG tablet, One pill a day [to prevent shingles], Disp: 30 tablet, Rfl: 3   atenolol (TENORMIN) 50 MG tablet, Take 1 tablet by mouth 2 (two) times daily., Disp: , Rfl:    calcium carbonate (OSCAL) 1500 (600 Ca) MG TABS tablet, Take 600 mg of elemental calcium by mouth daily with breakfast., Disp: , Rfl:    Cholecalciferol (VITAMIN D) 2000 units tablet, Take 1 tablet by mouth daily., Disp: , Rfl:    heparin lock flush 100 UNIT/ML SOLN injection, Inject into the vein., Disp: , Rfl:    Infant Care Products (DERMACLOUD) CREA, Apply topically., Disp: , Rfl:    levETIRAcetam (KEPPRA) 250 MG tablet, Take 1 tablet (250 mg total) by mouth every morning., Disp: 2 tablet, Rfl: 0   levofloxacin (LEVAQUIN) 250 MG tablet, Take 1 tablet (250 mg total) by mouth daily., Disp: 30 tablet, Rfl: 3   losartan-hydrochlorothiazide (HYZAAR) 50-12.5 MG tablet, Take 1 tablet by mouth daily., Disp: , Rfl:    magic mouthwash w/lidocaine SOLN, Take 5 mLs by mouth 4 (four) times daily as needed for mouth pain., Disp: 480 mL, Rfl: 3   ondansetron (ZOFRAN) 8 MG tablet, Take by mouth every 8 (eight) hours as needed for nausea or vomiting., Disp: , Rfl:    oxycodone (OXY-IR) 5 MG capsule, Take 1 capsule (5 mg total) by mouth every 4 (four) hours as needed., Disp: 15 capsule, Rfl: 0   polyethylene glycol (MIRALAX / GLYCOLAX) 17 g packet, Take 17 g by mouth daily., Disp: , Rfl:    polyethylene glycol (MIRALAX) 17 g packet, Take 17 g by mouth 2 (two) times daily., Disp: 60 each, Rfl: 0   potassium chloride SA (K-DUR) 20 MEQ tablet, Take 1 tablet (20 mEq total) by mouth 2 (two) times daily., Disp: 10 tablet, Rfl: 0   potassium chloride SA (KLOR-CON M20) 20 MEQ tablet, TAKE 1 TABLET BY MOUTH TWICE A DAY (Patient taking differently: Take 20 mEq by mouth daily. ), Disp: 30 tablet, Rfl: 3   senna (SENOKOT) 8.6 MG tablet, Take 2 tablets by mouth 2 (two)  times daily., Disp: , Rfl:    senna (SENOKOT) 8.6 MG TABS tablet, Take 1 tablet (8.6 mg total) by mouth 2 (two) times daily as needed for mild constipation., Disp: 120 tablet, Rfl: 0   polyethylene glycol (MIRALAX) 17 g packet, Take 17 g by mouth daily., Disp: 14 each, Rfl: 0   posaconazole (NOXAFIL) 100  MG TBEC delayed-release tablet, Take 2 tablets (200 mg total) by mouth daily. DO NOT START UNTIL-OK with MD. (Patient not taking: Reported on 02/07/2019), Disp: 60 tablet, Rfl: 3 No current facility-administered medications for this visit.   Facility-Administered Medications Ordered in Other Visits:    0.9 %  sodium chloride infusion, , Intravenous, Continuous, Ellarae Nevitt, Wandra Feinstein, NP, Stopped at 02/05/19 1630   acetaminophen (TYLENOL) tablet 650 mg, 650 mg, Oral, Once, Jovan Colligan, Wandra Feinstein, NP   diphenhydrAMINE (BENADRYL) capsule 25 mg, 25 mg, Oral, Once, Jene Oravec, Anderson Malta E, NP   heparin lock flush 100 unit/mL, 250 Units, Intracatheter, PRN, Jacquelin Hawking, NP   potassium chloride 20 mEq in 100 mL IVPB, 20 mEq, Intravenous, Once, Jacquelin Hawking, NP  Physical exam:  Vitals:   02/07/19 0845  BP: 135/86  Pulse: 71  Resp: 20  Temp: 97.7 F (36.5 C)  TempSrc: Tympanic  Weight: 99 lb (44.9 kg)  Height: '4\' 9"'$  (1.448 m)   Physical Exam Constitutional:      Appearance: Normal appearance. She is cachectic.  HENT:     Head: Normocephalic and atraumatic.  Eyes:     Pupils: Pupils are equal, round, and reactive to light.  Neck:     Musculoskeletal: Normal range of motion.  Cardiovascular:     Rate and Rhythm: Normal rate and regular rhythm.     Heart sounds: Normal heart sounds. No murmur.     Comments: TED hose Pulmonary:     Effort: Pulmonary effort is normal.     Breath sounds: Normal breath sounds. No wheezing.  Abdominal:     General: Bowel sounds are normal. There is no distension.     Palpations: Abdomen is soft.     Tenderness: There is no abdominal tenderness.   Musculoskeletal: Normal range of motion.     Right lower leg: Edema present.     Left lower leg: Edema present.  Skin:    General: Skin is warm and dry.     Coloration: Skin is pale.     Findings: No rash.  Neurological:     Mental Status: She is alert and oriented to person, place, and time.  Psychiatric:        Judgment: Judgment normal.      CMP Latest Ref Rng & Units 02/07/2019  Glucose 70 - 99 mg/dL 207(H)  BUN 8 - 23 mg/dL 35(H)  Creatinine 0.44 - 1.00 mg/dL 0.63  Sodium 135 - 145 mmol/L 137  Potassium 3.5 - 5.1 mmol/L 3.0(L)  Chloride 98 - 111 mmol/L 98  CO2 22 - 32 mmol/L 27  Calcium 8.9 - 10.3 mg/dL 8.9  Total Protein 6.5 - 8.1 g/dL 5.9(L)  Total Bilirubin 0.3 - 1.2 mg/dL 0.9  Alkaline Phos 38 - 126 U/L 71  AST 15 - 41 U/L 60(H)  ALT 0 - 44 U/L 23   CBC Latest Ref Rng & Units 02/07/2019  WBC 4.0 - 10.5 K/uL 1.2(LL)  Hemoglobin 12.0 - 15.0 g/dL 8.0(L)  Hematocrit 36.0 - 46.0 % 24.7(L)  Platelets 150 - 400 K/uL 9(LL)    No images are attached to the encounter.  Dg Chest 1 View  Result Date: 01/14/2019 CLINICAL DATA:  Hypoxia. EXAM: CHEST  1 VIEW COMPARISON:  01/13/2019. FINDINGS: Left PICC line in stable position. Stable cardiomegaly. Dense bibasilar atelectasis/infiltrates again noted. Small bilateral pleural effusions. Prominent deformity of the right chest with multiple right rib fractures again noted. Associated right upper pleural thickening is noted. No pneumothorax.  Proximal right humeral fracture. Surgical clips right chest. IMPRESSION: 1.  Left PICC line stable position 2.  Stable cardiomegaly. 3. Dense bibasilar atelectasis/infiltrates again noted. Small bilateral pleural effusions again noted. 4. Severe deformity of the right chest with multiple right rib fractures again noted. Associated right upper lateral pleural thickening is noted. No pneumothorax. 5.  Proximal right humeral fracture. Electronically Signed   By: Marcello Moores  Register   On: 01/14/2019 05:36    Dg Chest 1 View  Result Date: 01/13/2019 CLINICAL DATA:  Recent history of fall. Hypoxia. History of breast cancer. History of leukemia. EXAM: CHEST  1 VIEW COMPARISON:  01/10/2019. FINDINGS: PICC line stable position. Mediastinum and hilar structures are stable. Stable cardiomegaly. Dense bibasilar atelectasis/infiltrate noted on today's exam. Small bilateral pleural effusions. Deformity noted of the right chest with multiple right rib fractures again noted. Surgical clips right chest. IMPRESSION: 1.  PICC line in stable position. 2.  Stable cardiomegaly. 3. Dense bibasilar atelectasis/infiltrates noted on today's exam. Small bilateral pleural effusions noted. 4. Deformity noted of the right chest with multiple right rib fractures again noted. No pneumothorax. Electronically Signed   By: Marcello Moores  Register   On: 01/13/2019 12:36   Dg Chest 2 View  Result Date: 01/10/2019 CLINICAL DATA:  RIGHT chest pain following fall.  Initial encounter. EXAM: CHEST - 2 VIEW COMPARISON:  None. FINDINGS: Cardiomegaly noted. A LEFT PICC line with tip overlying the LOWER SVC noted. Mild interstitial opacities bilaterally are identified. Trace pleural effusions are noted. No focal airspace disease or pneumothorax. Fractures of the RIGHT 5th through 8th ribs noted. Mildly impacted RIGHT humeral neck fracture extending into the head is identified. RIGHT breast/axillary surgical clips noted. IMPRESSION: 1. RIGHT 5th through 8th rib fractures. No evidence of pneumothorax. 2. RIGHT humeral neck/head fracture. 3. Cardiomegaly with mild interstitial opacities of uncertain chronicity. With trace bilateral pleural effusions, these interstitial opacities may represent mild interstitial edema. Electronically Signed   By: Margarette Canada M.D.   On: 01/10/2019 08:43   Dg Shoulder Right  Result Date: 01/10/2019 CLINICAL DATA:  Acute RIGHT shoulder pain following fall. Initial encounter. EXAM: RIGHT SHOULDER - 2+ VIEW COMPARISON:  None.  FINDINGS: A mildly impacted humeral neck fracture is noted extending into the head and involving the greater tuberosity. No shoulder dislocation. Fractures of the RIGHT 5th through 8th ribs noted.  No pneumothorax. IMPRESSION: 1. Mildly impacted humeral neck fracture extending into the head and greater tuberosity. 2. RIGHT 5th through 8th rib fractures.  No pneumothorax. Electronically Signed   By: Margarette Canada M.D.   On: 01/10/2019 08:46   Ct Head Wo Contrast  Result Date: 01/10/2019 CLINICAL DATA:  Per neurosurgery repeat CT scan of the head in 6 hours. Pt ems from [facility] s/p fall mechanical pt c/o of rt shoulder pain. EXAM: CT HEAD WITHOUT CONTRAST TECHNIQUE: Contiguous axial images were obtained from the base of the skull through the vertex without intravenous contrast. COMPARISON:  01/10/2019 at 8:30 a.m. FINDINGS: Brain: No change in the small focus of hemorrhage noted along the anterior right frontal lobe. No new intracranial hemorrhage. No hydrocephalus or evidence of an ischemic infarct. Atrophy and mild chronic microvascular ischemic change is stable from the earlier exam. Vascular: No hyperdense vessel or unexpected calcification. Skull: Normal. Negative for fracture or focal lesion. Sinuses/Orbits: No acute finding. Other: None. IMPRESSION: 1. No change from the study obtained earlier today. 2. Trace posttraumatic hemorrhage along the anterior right frontal lobe. No new hemorrhage. Electronically Signed  By: Lajean Manes M.D.   On: 01/10/2019 15:30   Ct Head Wo Contrast  Addendum Date: 01/10/2019   ADDENDUM REPORT: 01/10/2019 09:31 ADDENDUM: Study discussed by telephone with Dr. Conni Slipper on 01/10/2019 at 0921 hours. Electronically Signed   By: Genevie Ann M.D.   On: 01/10/2019 09:31   Result Date: 01/10/2019 CLINICAL DATA:  85 year old female status post fall this morning. Right orbits/frontal injury. Neck pain. EXAM: CT HEAD WITHOUT CONTRAST CT CERVICAL SPINE WITHOUT CONTRAST  TECHNIQUE: Multidetector CT imaging of the head and cervical spine was performed following the standard protocol without intravenous contrast. Multiplanar CT image reconstructions of the cervical spine were also generated. COMPARISON:  CTA head 01/10/2017. CT face 01/09/2017. FINDINGS: CT HEAD FINDINGS Brain: Stable cerebral volume, normal for age. No midline shift, mass effect, or evidence of intracranial mass lesion. No ventriculomegaly. No intraventricular hemorrhage identified. Trace acute hemorrhage along the right anterior frontal convexity on series 2, image 13 appears to be subarachnoid rather than a hemorrhagic cerebral contusion. No other acute intracranial hemorrhage identified. Stable gray-white matter differentiation throughout the brain. No cortically based acute infarct identified. No cortical encephalomalacia. Normal basilar cisterns. Vascular: Calcified atherosclerosis at the skull base. No suspicious intracranial vascular hyperdensity. Skull: Intact. Sinuses/Orbits: Trace to mild paranasal sinus mucosal thickening is new since 2018. Tympanic cavities and mastoids remain clear. Other: Right posterior convexity scalp hematoma measuring up to 4 millimeters in thickness. Underlying calvarium intact. Stable and negative other orbit and scalp soft tissues. CT CERVICAL SPINE FINDINGS Alignment: Reversal of upper cervical lordosis is chronic and appears stable since 2018. Bilateral posterior element alignment is within normal limits. Cervicothoracic junction alignment is within normal limits. Skull base and vertebrae: Visualized skull base is intact. No atlanto-occipital dissociation. Congenital incomplete ossification of the posterior C1 ring. No acute osseous abnormality identified. Soft tissues and spinal canal: No prevertebral fluid or swelling. No visible canal hematoma. Disc levels: Widespread advanced cervical spine disc and endplate degeneration. Progressed left facet degeneration at C2-C3 since  2018. Mild if any associated cervical spinal stenosis. Upper chest: Grossly intact visible upper thoracic levels. Negative lung apices. IMPRESSION: 1. Positive for trace posttraumatic hemorrhage along the right anterior frontal lobe, favor trace subarachnoid over small hemorrhagic contusion. 2. No intracranial mass effect and no other acute traumatic injury to the brain identified. 3. Right posterior convexity scalp hematoma without underlying fracture. 4. No acute traumatic injury identified in the cervical spine. Electronically Signed: By: Genevie Ann M.D. On: 01/10/2019 09:09   Ct Cervical Spine Wo Contrast  Addendum Date: 01/10/2019   ADDENDUM REPORT: 01/10/2019 09:31 ADDENDUM: Study discussed by telephone with Dr. Conni Slipper on 01/10/2019 at 0921 hours. Electronically Signed   By: Genevie Ann M.D.   On: 01/10/2019 09:31   Result Date: 01/10/2019 CLINICAL DATA:  85 year old female status post fall this morning. Right orbits/frontal injury. Neck pain. EXAM: CT HEAD WITHOUT CONTRAST CT CERVICAL SPINE WITHOUT CONTRAST TECHNIQUE: Multidetector CT imaging of the head and cervical spine was performed following the standard protocol without intravenous contrast. Multiplanar CT image reconstructions of the cervical spine were also generated. COMPARISON:  CTA head 01/10/2017. CT face 01/09/2017. FINDINGS: CT HEAD FINDINGS Brain: Stable cerebral volume, normal for age. No midline shift, mass effect, or evidence of intracranial mass lesion. No ventriculomegaly. No intraventricular hemorrhage identified. Trace acute hemorrhage along the right anterior frontal convexity on series 2, image 13 appears to be subarachnoid rather than a hemorrhagic cerebral contusion. No other acute intracranial hemorrhage identified.  Stable gray-white matter differentiation throughout the brain. No cortically based acute infarct identified. No cortical encephalomalacia. Normal basilar cisterns. Vascular: Calcified atherosclerosis at the skull  base. No suspicious intracranial vascular hyperdensity. Skull: Intact. Sinuses/Orbits: Trace to mild paranasal sinus mucosal thickening is new since 2018. Tympanic cavities and mastoids remain clear. Other: Right posterior convexity scalp hematoma measuring up to 4 millimeters in thickness. Underlying calvarium intact. Stable and negative other orbit and scalp soft tissues. CT CERVICAL SPINE FINDINGS Alignment: Reversal of upper cervical lordosis is chronic and appears stable since 2018. Bilateral posterior element alignment is within normal limits. Cervicothoracic junction alignment is within normal limits. Skull base and vertebrae: Visualized skull base is intact. No atlanto-occipital dissociation. Congenital incomplete ossification of the posterior C1 ring. No acute osseous abnormality identified. Soft tissues and spinal canal: No prevertebral fluid or swelling. No visible canal hematoma. Disc levels: Widespread advanced cervical spine disc and endplate degeneration. Progressed left facet degeneration at C2-C3 since 2018. Mild if any associated cervical spinal stenosis. Upper chest: Grossly intact visible upper thoracic levels. Negative lung apices. IMPRESSION: 1. Positive for trace posttraumatic hemorrhage along the right anterior frontal lobe, favor trace subarachnoid over small hemorrhagic contusion. 2. No intracranial mass effect and no other acute traumatic injury to the brain identified. 3. Right posterior convexity scalp hematoma without underlying fracture. 4. No acute traumatic injury identified in the cervical spine. Electronically Signed: By: Genevie Ann M.D. On: 01/10/2019 09:09   Ct Shoulder Right Wo Contrast  Result Date: 01/10/2019 CLINICAL DATA:  Right shoulder pain after a fall today. Initial encounter. EXAM: CT OF THE UPPER RIGHT EXTREMITY WITHOUT CONTRAST TECHNIQUE: Multidetector CT imaging of the upper right extremity was performed according to the standard protocol. COMPARISON:  Plain films  right shoulder earlier today. FINDINGS: Bones/Joint/Cartilage As seen on the comparison study, the patient has a transverse fracture through the metaphysis of the humerus. The articular surface of the humeral head is rotated medially due to retraction by the rotator cuff. There is mild anterior displacement of the fracture. Nondisplaced component extends into the anterior aspect of the greater tuberosity. The lesser tuberosity is spared. The humeral head is located and the acromioclavicular joint is intact. Also seen are acute fractures of the posterior arcs of the right fifth, sixth, seventh and eighth ribs. No other fracture is identified. Ligaments Suboptimally assessed by CT. Muscles and Tendons Visualization of the rotator cuff is somewhat limited on CT but no fracture seen. Soft tissues There is some hemorrhage in the subacromial/subdeltoid bursa due to the patient's fracture. Small right pleural effusion is noted. No pneumothorax is identified but the anterior aspect of the right lung is seen only near the apex. Imaged lung parenchyma is clear with the exception of mild dependent atelectasis. IMPRESSION: Transverse fracture through the metaphysis of the humerus with mild impaction includes a nondisplaced component in the anterior aspect of the greater tuberosity. The humeral head is rotated medially due to retraction by the rotator cuff. Acute right fifth through eighth rib fractures with an associated small pleural effusion. No pneumothorax is seen but the anterior margin of the lung is only seen at the apex. Hemorrhage in the subacromial/subdeltoid bursa due to the patient's fracture. Electronically Signed   By: Inge Rise M.D.   On: 01/10/2019 15:34     Assessment and plan- Patient is a 85 y.o. female who presents to [facility] for follow-up for weakness and fatigue requiring blood transfusion, platelet transfusion and potassium IV.  AML: Recently diagnosed approximately  3 months ago after PCP noticed  progressive leukocytosis who subsequently was sent for a bone marrow.  She was started on Hydrea 500 mg daily in May 2018 until blood count started to drop.  Bone marrow did not reveal any significant increase in blasts.  Diagnosis was favored to be myelodysplastic or myeloproliferative neoplasm.  Counts continue to decline.  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  Had bone marrow biopsy on 12/24/2018 which revealed no blasts but was positive for dysplastic changes.  Her last treatment was 1 week ago where she received cycle 2 Vidaza and instructed to stop the 9:00's due to cytopenias.  She was given 1 unit packed red blood cells and 1 unit platelets.  She returned on Tuesday of this week for additional packed red blood cells and platelets plus potassium.  We continue to hold Mediplex and Noxafil.  Neutropenia: ANC 400.  Required Granix during first cycle of Vidaza plus venetoclax but did not receive during cycle 2.  Continue to hold venetoclax.  Scheduled to return to clinic on Monday for next cycle of Vidaza.  Elevated AST: Trending down after holding Noxafil.  Previous AST was 248-126 and today is 60.  Continue to hold Noxafil at this time.  Thrombocytopenia: Slightly improved from previous.  Today is 9000.  Will require 1 additional unit of platelets today.  See plan below.  Hypokalemia: Potassium improving 3.0 today.  Previously 2.8.  Will receive 20 mEq potassium IV today.  She states she is not sure if she has picked up her potassium supplements yet.  I will call her husband and find out.  Anemia: Improved with 1 unit packed red blood cells on Wednesday.  Continue to monitor.  Elevated LDH: Due to disease process and treatment.  Trending down.  Constipation: Begin MiraLAX daily.  May add Senokot as needed for stable bowel movements.  Hypomagnesemia: Mag 1.7. Replace. See below.   Plan: Stat labs.   Hemoglobin 8, platelets 9000, ANC 400, K3.0 Stat vital signs, improved.  Stable. Give 2g Magnesium IV today.  Give 20 mEq IV potassium today. Give 1 unit platelets. Continue potassium supplements twice daily x5 days. Hold venetoclax. Hold Noxafil.  Disposition: RTC on Monday as scheduled to be seen by Dr. Per Monday for consideration of cycle 3 Vidaza plus venetoclax. Start MiraLAX daily +/- Senna.  Start potassium supplements twice daily. Continue prophylactic Levaquin.   Visit Diagnosis 1. AML (acute myeloid leukemia) with failed remission ([place])     Patient expressed understanding and was in agreement with this plan. She also understands that She can call clinic at any time with any questions, concerns, or complaints.   Greater than 50% was spent in counseling and coordination of care with this patient including but not limited to discussion of the relevant topics above (See A&P) including, but not limited to diagnosis and management of acute and chronic medical conditions.   Thank you for allowing me to participate in the care of this very pleasant patient.    Jacquelin Hawking, NP [place] at [facility] Cell - [phone] Pager- [phone] 02/07/2019 10:28 AM

## 2019-02-07 NOTE — Telephone Encounter (Signed)
Oral Oncology Pharmacist Encounter  Received new prescription for Xospata (gilteritinib) for the treatment of AML with failed remission. She is FLT3 mutation positive. She was previously treated with venetoclax and azacitadine. Planned duration at least 6 months or until disease progression or unacceptable drug toxicity.  CBC from 02/07/2019 assessed, no relevant lab abnormalities. ECG from 7/31/20showed a QTc of 472. Prescription dose and frequency assessed.   Current medication list in Epic reviewed, a few DDIs with gilteritinib identified: -Posaconazole: Posaconazole may increase the concentration on gilteritinib. Posoconazole is necessary for infection prophylaxis. Conducted literature search for information in managing this interaction. Reed et al. published a gilteritinib review article in [place]. "Gilteritinib: An FMS-like tyrosine kinase 3/AXL tyrosine kinase inhibitor for the treatment of relapsed or refractory acute myeloid leukemia patients". Drug interaction management was discussed in this article. The article suggested that in frail patients where adverse events were a concern dose reducing from '120mg'$  to '80mg'$  may be considered. Medication activity was seen at this dose level in phase 1-2 studies. Reviewed this information with Dr. Rogue Bussing and the decision was made to dose reduce to '80mg'$  and increase if tolerated/necessary. He is also thinking of reducing the dose of posaconazole due to past elevations in liver enzymes **Of note, the max tolerated dose during the dose finding study was found to be '300mg'$ , the FDA approved dose is '120mg'$ . With this information we know that dose concentrations above '120mg'$  have used safely.  -Levofloxacin: QT-prolonging quinolone antibiotics may enhance the QTc-prolonging effect of medications like gilteritinib. It will help that she is starting on a reduced dose of gilteritinib. Dr. Rogue Bussing plans on repeating an ECG.  Prescription has been e-scribed to  the [facility] for benefits analysis and approval.  Oral Oncology Clinic will continue to follow for insurance authorization, copayment issues, initial counseling and start date.  Darl Pikes, PharmD, BCPS, [facility] Hematology/Oncology Clinical Pharmacist [facility]/AP [facility] [place] [facility] [phone]  02/07/2019 12:38 PM

## 2019-02-07 NOTE — Progress Notes (Signed)
Labs reviewed with NP, new orders placed. Pt updated and all questions answered at this time.   Shontia Gillooly [employer]

## 2019-02-08 LAB — BPAM PLATELET PHERESIS
Blood Product Expiration Date: 202008302359
ISSUE DATE / TIME: 202008281056
Unit Type and Rh: 5100

## 2019-02-08 LAB — PREPARE PLATELET PHERESIS: Unit division: 0

## 2019-02-10 ENCOUNTER — Inpatient Hospital Stay: Payer: Medicare Other

## 2019-02-10 ENCOUNTER — Inpatient Hospital Stay (HOSPITAL_BASED_OUTPATIENT_CLINIC_OR_DEPARTMENT_OTHER): Payer: Medicare Other | Admitting: Internal Medicine

## 2019-02-10 ENCOUNTER — Other Ambulatory Visit: Payer: Self-pay

## 2019-02-10 ENCOUNTER — Other Ambulatory Visit: Payer: Self-pay | Admitting: *Deleted

## 2019-02-10 DIAGNOSIS — C9200 Acute myeloblastic leukemia, not having achieved remission: Secondary | ICD-10-CM | POA: Diagnosis not present

## 2019-02-10 DIAGNOSIS — Z5111 Encounter for antineoplastic chemotherapy: Secondary | ICD-10-CM | POA: Diagnosis not present

## 2019-02-10 LAB — COMPREHENSIVE METABOLIC PANEL
ALT: 24 U/L (ref 0–44)
AST: 36 U/L (ref 15–41)
Albumin: 3.2 g/dL — ABNORMAL LOW (ref 3.5–5.0)
Alkaline Phosphatase: 74 U/L (ref 38–126)
Anion gap: 8 (ref 5–15)
BUN: 30 mg/dL — ABNORMAL HIGH (ref 8–23)
CO2: 28 mmol/L (ref 22–32)
Calcium: 8.7 mg/dL — ABNORMAL LOW (ref 8.9–10.3)
Chloride: 98 mmol/L (ref 98–111)
Creatinine, Ser: 0.59 mg/dL (ref 0.44–1.00)
GFR calc Af Amer: >60 mL/min (ref >60)
GFR calc non Af Amer: >60 mL/min (ref >60)
Glucose, Bld: 212 mg/dL — ABNORMAL HIGH (ref 70–99)
Potassium: 3.2 mmol/L — ABNORMAL LOW (ref 3.5–5.1)
Sodium: 134 mmol/L — ABNORMAL LOW (ref 135–145)
Total Bilirubin: 0.9 mg/dL (ref 0.3–1.2)
Total Protein: 5.7 g/dL — ABNORMAL LOW (ref 6.5–8.1)

## 2019-02-10 LAB — CBC WITH DIFFERENTIAL/PLATELET
Abs Immature Granulocytes: 0.07 10*3/uL (ref 0.00–0.07)
Basophils Absolute: 0 10*3/uL (ref 0.0–0.1)
Basophils Relative: 1 %
Eosinophils Absolute: 0 10*3/uL (ref 0.0–0.5)
Eosinophils Relative: 0 %
HCT: 23.2 % — ABNORMAL LOW (ref 36.0–46.0)
Hemoglobin: 7.5 g/dL — ABNORMAL LOW (ref 12.0–15.0)
Immature Granulocytes: 9 %
Lymphocytes Relative: 30 %
Lymphs Abs: 0.2 10*3/uL — ABNORMAL LOW (ref 0.7–4.0)
MCH: 26.7 pg (ref 26.0–34.0)
MCHC: 32.3 g/dL (ref 30.0–36.0)
MCV: 82.6 fL (ref 80.0–100.0)
Monocytes Absolute: 0.3 10*3/uL (ref 0.1–1.0)
Monocytes Relative: 44 %
Neutro Abs: 0.1 10*3/uL — ABNORMAL LOW (ref 1.7–7.7)
Neutrophils Relative %: 16 %
Platelets: 12 10*3/uL — CL (ref 150–400)
RBC: 2.81 MIL/uL — ABNORMAL LOW (ref 3.87–5.11)
RDW: 21 % — ABNORMAL HIGH (ref 11.5–15.5)
Smear Review: DECREASED
WBC: 0.8 10*3/uL — CL (ref 4.0–10.5)
nRBC: 8.9 % — ABNORMAL HIGH (ref 0.0–0.2)

## 2019-02-10 LAB — PREPARE RBC (CROSSMATCH)

## 2019-02-10 LAB — LACTATE DEHYDROGENASE: LDH: 811 U/L — ABNORMAL HIGH (ref 98–192)

## 2019-02-10 MED ORDER — ACETAMINOPHEN 325 MG PO TABS
650.0000 mg | ORAL_TABLET | Freq: Once | ORAL | Status: AC
  Administered 2019-02-10: 12:00:00 650 mg via ORAL
  Filled 2019-02-10: qty 2

## 2019-02-10 MED ORDER — HEPARIN SOD (PORK) LOCK FLUSH 100 UNIT/ML IV SOLN
500.0000 [IU] | Freq: Once | INTRAVENOUS | Status: AC
  Administered 2019-02-10: 15:00:00 500 [IU] via INTRAVENOUS

## 2019-02-10 MED ORDER — SODIUM CHLORIDE 0.9% IV SOLUTION
250.0000 mL | Freq: Once | INTRAVENOUS | Status: AC
  Administered 2019-02-10: 250 mL via INTRAVENOUS
  Filled 2019-02-10: qty 250

## 2019-02-10 MED ORDER — DIPHENHYDRAMINE HCL 25 MG PO CAPS
25.0000 mg | ORAL_CAPSULE | Freq: Once | ORAL | Status: AC
  Administered 2019-02-10: 12:00:00 25 mg via ORAL
  Filled 2019-02-10: qty 1

## 2019-02-10 NOTE — Progress Notes (Signed)
[place] CONSULT NOTE  Patient Care Team: Rusty Aus, MD as PCP - General (Internal Medicine)  CHIEF COMPLAINTS/PURPOSE OF CONSULTATION:    Oncology History Overview Note  # SEP 2017- MYELOPROLIFERATIVE NEOPLASM- [WBC- 5; normal Hb/platelets] hypercellular bone marrow 90-95% proliferation of myeloid cells in various stages of maturation; relative erythroid hypoplasia and proliferation of atypical megakaryocytes; no increase in blasts; peripheral blood Bcr-Abl-NEG; Cytogenetics- WNL. NEG- Jak-2/MPL/CALR [place] limited- mild splenomegaly [~10cm; 463cm3]; OCT 2017- second opinion at [place]. Surveillance.   # With progressive leukocytosis we evaluated her a month ago and sent BM for exam. She has a progressive leukocytosis so hydrea '500mg'$  daily was started on 10/20/2016 and increased to 2gm daily then back down to one daily, then 5 days per week over time due to counts drop. Then we held her hydrea since 04/09/2018 due to continued declining Hb and Plt.s  BMB 06/24/18 showed markedly hypercellular BM 95% with myeloid hyperplasia and atypical megakaryocytic hyperplasia. No significant increase in blasts. 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 BCR / ABL negative, FISH normal. Flow Showed 2%CD34-positive myeloid blasts. Myeloid precursors with low side scatter. Pathogenic variants were detected in the ASXL1, CCND2, CUX1, and U2AF1 genes.  # March 2020- wbc- 14/ Hb 9.5/ platelets- 54.   ---------------------------------------------------   # June 8th 2020- Acute myeloid leukemia [peripheral blood flow cytometry;NGS- pending]- June 15th Vidaza [SQ 1-5 + Venatoclax- #1in pt]; tumor lysis prophylaxis  # Day #28 bone marrow biopsy-  7/14- BMx- NO BLASTS; positive for dysplastic changes. Currently off Venatoclax [since 7/17] sec to cytopenias.  # aug 3rd 2020-    # 2002 [[place]] BREAST CA s/p Lumpect RT; [? Stage I] no  chemo s/p AI  # F-ONE HEM: FLT3 ALTERATION NOTED  DIAGNOSIS: ACUTE MYELOID LEUKEMIA GOALS: pallaitive  CURRENT/MOST RECENT THERAPY : Vidaza+ venotoclax.      MDS (myelodysplastic syndrome) ([place]) (Resolved)  08/28/2018 Initial Diagnosis   MDS (myelodysplastic syndrome) (HCC)   AML (acute myeloid leukemia) with failed remission ([place])  11/25/2018 Initial Diagnosis   AML (acute myeloid leukemia) with failed remission ([place])   11/25/2018 - 12/22/2018 Chemotherapy   The patient had azaCITIDine (VIDAZA) chemo injection 105 mg, 75 mg/m2 = 105 mg, Subcutaneous,  Once, 1 of 4 cycles Administration: 105 mg (11/25/2018), 105 mg (11/26/2018), 105 mg (11/27/2018), 105 mg (11/28/2018), 105 mg (11/29/2018)  for chemotherapy treatment.    01/27/2019 -  Chemotherapy   The patient had palonosetron (ALOXI) injection 0.25 mg, 0.25 mg, Intravenous,  Once, 1 of 4 cycles Administration: 0.25 mg (01/27/2019), 0.25 mg (01/29/2019), 0.25 mg (01/31/2019) azaCITIDine (VIDAZA) 100 mg in sodium chloride 0.9 % 50 mL chemo infusion, 75 mg/m2 = 100 mg, Intravenous, Once, 1 of 4 cycles Administration: 100 mg (01/27/2019), 100 mg (01/28/2019), 100 mg (01/29/2019), 100 mg (01/30/2019), 100 mg (01/31/2019)  for chemotherapy treatment.      HISTORY OF PRESENTING ILLNESS:  Kathryn Hahn 85 y.o.  female with acute myeloid leukemia with monocytic differentiation currently on Vidaza plus venetoclax is here for follow-up.  Patient received cycle #2 Vidaza plus venetoclax 2 week ago.  Venetoclax currently on hold since August 24.  Patient received PRBC transfusion/platelet transfusion x2 last week.  Continues to complain of moderate fatigue.  Mild swelling in the legs.  Shortness of breath with exertion.  No cough or chills.  Constipation improved.  Review of Systems  Constitutional: Positive for malaise/fatigue and weight loss. Negative for chills, diaphoresis and fever.  HENT:  Negative for nosebleeds and sore throat.   Eyes:  Negative for double vision.  Respiratory: Positive for shortness of breath. Negative for cough, hemoptysis, sputum production and wheezing.   Cardiovascular: Positive for leg swelling. Negative for chest pain, palpitations and orthopnea.       Right chest wall pain/rib pain status post fractures.  Gastrointestinal: Positive for constipation. Negative for abdominal pain, blood in stool, heartburn, melena, nausea and vomiting.  Genitourinary: Negative for dysuria, frequency and urgency.  Musculoskeletal: Negative for back pain and joint pain.  Skin: Negative.  Negative for itching and rash.  Neurological: Negative for dizziness, tingling, focal weakness, weakness and headaches.  Endo/Heme/Allergies: Bruises/bleeds easily.  Psychiatric/Behavioral: Negative for depression. The patient is not nervous/anxious and does not have insomnia.     MEDICAL HISTORY:  Past Medical History:  Diagnosis Date  . Anemia   . Arthritis   . Breast cancer ([place]) 2003   RT LUMPECTOMY  . Edema leg   . History of breast cancer 2003   post lumpectomy  . History of kidney stones   . Hyperlipemia, mixed   . Hypertension, essential   . Leukemia ([place])   . Leukocytosis   . MDS (myelodysplastic syndrome) ([place])   . Personal history of radiation therapy 2003   BREAST CA  . Renal stones   . Vitamin D deficiency     SURGICAL HISTORY: Past Surgical History:  Procedure Laterality Date  . BREAST BIOPSY Right 2003   Postive for cancer  . BREAST LUMPECTOMY Right 2003   BREAST CA  . KIDNEY STONE SURGERY Right     SOCIAL HISTORY: Social History   Socioeconomic History  . Marital status: Married    Spouse name: Not on file  . Number of children: Not on file  . Years of education: Not on file  . Highest education level: Not on file  Occupational History  . Not on file  Social Needs  . Financial resource strain: Not on file  . Food insecurity    Worry: Not on file    Inability: Not on file  .  Transportation needs    Medical: Not on file    Non-medical: Not on file  Tobacco Use  . Smoking status: Never Smoker  . Smokeless tobacco: Never Used  Substance and Sexual Activity  . Alcohol use: Yes    Alcohol/week: 3.0 standard drinks    Types: 3 Glasses of wine per week  . Drug use: No  . Sexual activity: Not on file  Lifestyle  . Physical activity    Days per week: Not on file    Minutes per session: Not on file  . Stress: Not on file  Relationships  . Social Herbalist on phone: Not on file    Gets together: Not on file    Attends religious service: Not on file    Active member of club or organization: Not on file    Attends meetings of clubs or organizations: Not on file    Relationship status: Not on file  . Intimate partner violence    Fear of current or ex partner: Not on file    Emotionally abused: Not on file    Physically abused: Not on file    Forced sexual activity: Not on file  Other Topics Concern  . Not on file  Social History Narrative    No smoking/alcohol; lives in [place] with family.  She lives in assisted living with her husband.  FAMILY HISTORY: Family History  Problem Relation Age of Onset  . Stroke Mother   . Hypertension Father   . Stroke Father   . Breast cancer Neg Hx     ALLERGIES:  is allergic to terbinafine.  MEDICATIONS:  Current Outpatient Medications  Medication Sig Dispense Refill  . acetaminophen (TYLENOL) 325 MG tablet Take by mouth.    Marland Kitchen acyclovir (ZOVIRAX) 400 MG tablet One pill a day [to prevent shingles] 30 tablet 3  . atenolol (TENORMIN) 50 MG tablet Take 1 tablet by mouth 2 (two) times daily.    . calcium carbonate (OSCAL) 1500 (600 Ca) MG TABS tablet Take 600 mg of elemental calcium by mouth daily with breakfast.    . Cholecalciferol (VITAMIN D) 2000 units tablet Take 1 tablet by mouth daily.    . heparin lock flush 100 UNIT/ML SOLN injection Inject into the vein.    . Infant Care Products  (DERMACLOUD) CREA Apply topically.    . levETIRAcetam (KEPPRA) 250 MG tablet Take 1 tablet (250 mg total) by mouth every morning. 2 tablet 0  . levofloxacin (LEVAQUIN) 250 MG tablet Take 1 tablet (250 mg total) by mouth daily. 30 tablet 3  . losartan-hydrochlorothiazide (HYZAAR) 50-12.5 MG tablet Take 1 tablet by mouth daily.    . ondansetron (ZOFRAN) 8 MG tablet Take by mouth every 8 (eight) hours as needed for nausea or vomiting.    . polyethylene glycol (MIRALAX / GLYCOLAX) 17 g packet Take 17 g by mouth daily.    . potassium chloride SA (K-DUR) 20 MEQ tablet Take 1 tablet (20 mEq total) by mouth 2 (two) times daily. 10 tablet 0  . senna (SENOKOT) 8.6 MG tablet Take 2 tablets by mouth 2 (two) times daily.    . Gilteritinib Fumarate (XOSPATA) 40 MG TABS Take 80 mg by mouth daily. (Patient not taking: Reported on 02/07/2019) 60 tablet 2  . magic mouthwash w/lidocaine SOLN Take 5 mLs by mouth 4 (four) times daily as needed for mouth pain. (Patient not taking: Reported on 02/07/2019) 480 mL 3  . oxycodone (OXY-IR) 5 MG capsule Take 1 capsule (5 mg total) by mouth every 4 (four) hours as needed. (Patient not taking: Reported on 02/07/2019) 15 capsule 0  . posaconazole (NOXAFIL) 100 MG TBEC delayed-release tablet Take 2 tablets (200 mg total) by mouth daily. DO NOT START UNTIL-OK with MD. (Patient not taking: Reported on 02/07/2019) 60 tablet 3   No current facility-administered medications for this visit.    Facility-Administered Medications Ordered in Other Visits  Medication Dose Route Frequency Provider Last Rate Last Dose  . 0.9 %  sodium chloride infusion (Manually program via Guardrails IV Fluids)  250 mL Intravenous Once Charlaine Dalton R, MD      . 0.9 %  sodium chloride infusion   Intravenous Continuous Jacquelin Hawking, NP   Stopped at 02/05/19 1630  . acetaminophen (TYLENOL) tablet 650 mg  650 mg Oral Once Charlaine Dalton R, MD      . diphenhydrAMINE (BENADRYL) capsule 25 mg  25 mg  Oral Once Cammie Sickle, MD          .  PHYSICAL EXAMINATION: ECOG PERFORMANCE STATUS: 0 - Asymptomatic  Vitals:   02/10/19 1000  BP: 106/62  Pulse: 66  Resp: 16  Temp: (!) 96.8 F (36 C)   Filed Weights   02/10/19 1000  Weight: 92 lb 3.2 oz (41.8 kg)    Physical Exam  Constitutional: She is oriented to person,  place, and time and well-developed, well-nourished, and in no distress.  Alone.  She is in a wheelchair.  HENT:  Head: Normocephalic and atraumatic.  Mouth/Throat: Oropharynx is clear and moist. No oropharyngeal exudate.  Eyes: Pupils are equal, round, and reactive to light.  Neck: Normal range of motion. Neck supple.  Cardiovascular: Normal rate and regular rhythm.  Pulmonary/Chest: No respiratory distress. She has no wheezes.  Abdominal: Soft. Bowel sounds are normal. She exhibits no distension and no mass. There is no abdominal tenderness. There is no rebound and no guarding.  Musculoskeletal: Normal range of motion.        General: Edema present. No tenderness.     Comments: Right upper extremity in sling.   Neurological: She is alert and oriented to person, place, and time.  Skin: Skin is warm.  Multiple bruises noted.  Positive for mild bleeding around the site of the PICC line insertion.  Multiple abdominal wall bruises noted.  Psychiatric: Affect normal.  ;   LABORATORY DATA:  I have reviewed the data as listed Lab Results  Component Value Date   WBC 0.8 (LL) 02/10/2019   HGB 7.5 (L) 02/10/2019   HCT 23.2 (L) 02/10/2019   MCV 82.6 02/10/2019   PLT 12 (LL) 02/10/2019   Recent Labs    02/05/19 0920 02/07/19 0830 02/10/19 0855  NA 138 137 134*  K 2.8* 3.0* 3.2*  CL 99 98 98  CO2 '28 27 28  '$ GLUCOSE 196* 207* 212*  BUN 54* 35* 30*  CREATININE 0.74 0.63 0.59  CALCIUM 9.0 8.9 8.7*  GFRNONAA >60 >60 >60  GFRAA >60 >60 >60  PROT 5.9* 5.9* 5.7*  ALBUMIN 3.4* 3.3* 3.2*  AST 126* 60* 36  ALT '24 23 24  '$ ALKPHOS 74 71 74  BILITOT 0.9  0.9 0.9    RADIOGRAPHIC STUDIES: I have personally reviewed the radiological images as listed and agreed with the findings in the report. Dg Chest 1 View  Result Date: 01/14/2019 CLINICAL DATA:  Hypoxia. EXAM: CHEST  1 VIEW COMPARISON:  01/13/2019. FINDINGS: Left PICC line in stable position. Stable cardiomegaly. Dense bibasilar atelectasis/infiltrates again noted. Small bilateral pleural effusions. Prominent deformity of the right chest with multiple right rib fractures again noted. Associated right upper pleural thickening is noted. No pneumothorax. Proximal right humeral fracture. Surgical clips right chest. IMPRESSION: 1.  Left PICC line stable position 2.  Stable cardiomegaly. 3. Dense bibasilar atelectasis/infiltrates again noted. Small bilateral pleural effusions again noted. 4. Severe deformity of the right chest with multiple right rib fractures again noted. Associated right upper lateral pleural thickening is noted. No pneumothorax. 5.  Proximal right humeral fracture. Electronically Signed   By: Marcello Moores  Register   On: 01/14/2019 05:36   Dg Chest 1 View  Result Date: 01/13/2019 CLINICAL DATA:  Recent history of fall. Hypoxia. History of breast cancer. History of leukemia. EXAM: CHEST  1 VIEW COMPARISON:  01/10/2019. FINDINGS: PICC line stable position. Mediastinum and hilar structures are stable. Stable cardiomegaly. Dense bibasilar atelectasis/infiltrate noted on today's exam. Small bilateral pleural effusions. Deformity noted of the right chest with multiple right rib fractures again noted. Surgical clips right chest. IMPRESSION: 1.  PICC line in stable position. 2.  Stable cardiomegaly. 3. Dense bibasilar atelectasis/infiltrates noted on today's exam. Small bilateral pleural effusions noted. 4. Deformity noted of the right chest with multiple right rib fractures again noted. No pneumothorax. Electronically Signed   By: Marcello Moores  Register   On: 01/13/2019 12:36    ASSESSMENT &  PLAN:   AML  (acute myeloid leukemia) with failed remission ([place]) #Acute myeloid leukemia with monocytic differentiation [20% blasts on peripheral blood flow cytometry]. Currently on cycle #1- Vidaza + Venatoclax.  Day #28 bone marrow biopsy-  7/14- BMx- NO BLASTS; positive for dysplastic changes.   # S/p Vidaza cycle #2 on 8/17; plus ven 100 mg/day; VEN- HELD since 8/24. Today day #14 today-white count 0.8 hemoglobin 7.5 platelets 12. Continue to HOLD  Venatoclax .  Awaiting to restart gilteritinib.  Awaiting insurance approval.  Discussed with [facility].  #Proceed with PRBC transfusion/platelet transfusion; reevaluate on 09/04 days.  # AST elevation- 248-?sec to Posonoazole; normal ALT/al phos/bilirubin- RESOLVED; will res-tart at lower dose at 100 mg/day.   #Status post mechanical fall intracranial bleed-minor bleed; status post evaluation at [facility]surgery.  Monitor for now.   # Supportive care:  # Continue Levaquin/Noxafil at '100mg'$ /acyclovir # PICC dressing weekly  #Discussed with patient's husband-at length regarding patient's plan of care/labs etc. verbalized understanding to stop venetoclax-under further directions. Will re-start noxafil 100 mg/day. CMA to inform patient's husband. -------------------------------------------------------------------------------- # DISPOSITION:    # PRBC /platelets today.  # on 09/04- labs- cbc-HOLD tube-possible platelet/Blood transfusion  # Follow up with MD- 09/08 cbc/cmp/LDH- HOLD tube/possible plaletetsDr.B    Cammie Sickle, MD 02/10/2019 10:21 AM

## 2019-02-10 NOTE — Telephone Encounter (Signed)
Oral Oncology Patient Advocate Encounter  Prior Authorization for Kathryn Hahn has been approved.    PA# [id number] Effective dates: 02/07/2019 to until further notice.  Patients co-pay is $819.05.  Patient has active grant assistance that will cover copay.  Oral Oncology Clinic will continue to follow.   [place] Patient [place] Phone [phone] Fax [phone] 02/10/2019 12:30 PM

## 2019-02-10 NOTE — Assessment & Plan Note (Addendum)
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 Currently on cycle #1- Vidaza + Venatoclax.  Day #28 bone marrow biopsy-  7/14- BMx- NO BLASTS; positive for dysplastic changes.   # S/p Vidaza cycle #2 on 8/17; plus ven 100 mg/day; VEN- HELD since 8/24. Today day #14 today-white count 0.8 hemoglobin 7.5 platelets 12. Continue to HOLD  Venatoclax .  Awaiting to restart gilteritinib.  Awaiting insurance approval.  Discussed with [facility].  #Proceed with PRBC transfusion/platelet transfusion; reevaluate on 09/04 days.  # AST elevation- 248-?sec to Posonoazole; normal ALT/al phos/bilirubin- RESOLVED; will res-tart at lower dose at 100 mg/day.   #Status post mechanical fall intracranial bleed-minor bleed; status post evaluation at [facility] neurosurgery.  Monitor for now.   # Supportive care:  # Continue Levaquin/Noxafil at 185m/acyclovir # PICC dressing weekly  #Discussed with patient's husband-at length regarding patient's plan of care/labs etc. verbalized understanding to stop venetoclax-under further directions. Will re-start noxafil 100 mg/day. CMA to inform patient's husband. -------------------------------------------------------------------------------- # DISPOSITION:    # PRBC /platelets today.  # on 09/04- labs- cbc-HOLD tube-possible platelet/Blood transfusion  # Follow up with MD- 09/08 cbc/cmp/LDH- HOLD tube/possible plaletetsDr.B

## 2019-02-11 ENCOUNTER — Encounter
Admission: RE | Admit: 2019-02-11 | Discharge: 2019-02-11 | Disposition: A | Discharge status: Home / Self Care | Payer: Medicare Other | Source: Ambulatory Visit | Attending: Internal Medicine | Admitting: Internal Medicine

## 2019-02-11 LAB — TYPE AND SCREEN
ABO/RH(D): O POS
Antibody Screen: NEGATIVE
Unit division: 0

## 2019-02-11 LAB — PREPARE PLATELET PHERESIS: Unit division: 0

## 2019-02-11 LAB — BPAM RBC
Blood Product Expiration Date: 202009222359
ISSUE DATE / TIME: 202008311305
Unit Type and Rh: 5100

## 2019-02-11 LAB — BPAM PLATELET PHERESIS
Blood Product Expiration Date: 202009032359
ISSUE DATE / TIME: 202008311201
Unit Type and Rh: 5100

## 2019-02-12 NOTE — Telephone Encounter (Signed)
Oral Oncology Patient Advocate Encounter  I spoke with Mr & Mrs Kathryn Hahn today and set up delivery of Xospata.  Address verified for shipment.  Kathryn Hahn will be filled through [facility] and mailed 9/3 for delivery 9/4.    [facility] will call 1 week before next refill is due to set up delivery of her medication.  They are familiar with this process and had no questions.  [place] [facility] [place] Phone [phone] Fax [phone] 02/12/2019 4:03 PM

## 2019-02-12 NOTE — Telephone Encounter (Signed)
Oral Chemotherapy Pharmacist Encounter  Patient know not to start the medication until given the go ahead from Dr. Rogue Bussing.   Patient Education I spoke with patient and her husband for overview of new oral chemotherapy medication: Xospata (gilteritinib) for the treatment of AML with failed remission. She is FLT3 mutation positive. She was previously treated with venetoclax and azacitadine. Planned duration at least 6 months or until disease progression or unacceptable drug toxicity.  Counseled them on administration, dosing, side effects, monitoring, drug-food interactions, safe handling, storage, and disposal. Patient will take 80 mg by mouth daily.  Side effects include but not limited to: Changes in electrolytes, muscle/joint pain, fatigue, fluid retention, diarrhea, rash.    Reviewed the importance of keeping a medication schedule and plan for any missed doses.  Mr. and Mrs. Pinkstaff voiced understanding and appreciation. All questions answered.  Provided patient with [facility] phone number. Patient knows to call the office with questions or concerns. Oral [facility] will continue to follow.  Darl Pikes, PharmD, BCPS, [facility] Hematology/Oncology Clinical Pharmacist [facility]/HP/AP [facility] [phone]  02/12/2019 3:49 PM

## 2019-02-13 ENCOUNTER — Other Ambulatory Visit: Payer: Self-pay

## 2019-02-13 DIAGNOSIS — C9200 Acute myeloblastic leukemia, not having achieved remission: Secondary | ICD-10-CM

## 2019-02-13 MED FILL — XOSPATA 40 MG TABS: 40 | 30 days supply | Qty: 60 | Fill #0

## 2019-02-14 ENCOUNTER — Other Ambulatory Visit: Payer: Self-pay | Admitting: *Deleted

## 2019-02-14 ENCOUNTER — Inpatient Hospital Stay: Payer: Medicare Other | Attending: Internal Medicine | Admitting: *Deleted

## 2019-02-14 ENCOUNTER — Other Ambulatory Visit: Payer: Self-pay

## 2019-02-14 ENCOUNTER — Inpatient Hospital Stay: Payer: Medicare Other

## 2019-02-14 ENCOUNTER — Telehealth: Payer: Self-pay | Admitting: *Deleted

## 2019-02-14 DIAGNOSIS — C9200 Acute myeloblastic leukemia, not having achieved remission: Secondary | ICD-10-CM

## 2019-02-14 DIAGNOSIS — D61818 Other pancytopenia: Secondary | ICD-10-CM | POA: Diagnosis not present

## 2019-02-14 DIAGNOSIS — Z79899 Other long term (current) drug therapy: Secondary | ICD-10-CM | POA: Diagnosis not present

## 2019-02-14 DIAGNOSIS — Z452 Encounter for adjustment and management of vascular access device: Secondary | ICD-10-CM

## 2019-02-14 DIAGNOSIS — R233 Spontaneous ecchymoses: Secondary | ICD-10-CM | POA: Insufficient documentation

## 2019-02-14 LAB — BASIC METABOLIC PANEL
Anion gap: 9 (ref 5–15)
BUN: 27 mg/dL — ABNORMAL HIGH (ref 8–23)
CO2: 29 mmol/L (ref 22–32)
Calcium: 9 mg/dL (ref 8.9–10.3)
Chloride: 100 mmol/L (ref 98–111)
Creatinine, Ser: 0.53 mg/dL (ref 0.44–1.00)
GFR calc Af Amer: >60 mL/min (ref >60)
GFR calc non Af Amer: >60 mL/min (ref >60)
Glucose, Bld: 152 mg/dL — ABNORMAL HIGH (ref 70–99)
Potassium: 3.4 mmol/L — ABNORMAL LOW (ref 3.5–5.1)
Sodium: 138 mmol/L (ref 135–145)

## 2019-02-14 LAB — CBC WITH DIFFERENTIAL/PLATELET
Abs Immature Granulocytes: 0.02 10*3/uL (ref 0.00–0.07)
Basophils Absolute: 0 10*3/uL (ref 0.0–0.1)
Basophils Relative: 0 %
Eosinophils Absolute: 0 10*3/uL (ref 0.0–0.5)
Eosinophils Relative: 0 %
HCT: 24.1 % — ABNORMAL LOW (ref 36.0–46.0)
Hemoglobin: 7.7 g/dL — ABNORMAL LOW (ref 12.0–15.0)
Immature Granulocytes: 3 %
Lymphocytes Relative: 30 %
Lymphs Abs: 0.2 10*3/uL — ABNORMAL LOW (ref 0.7–4.0)
MCH: 27 pg (ref 26.0–34.0)
MCHC: 32 g/dL (ref 30.0–36.0)
MCV: 84.6 fL (ref 80.0–100.0)
Monocytes Absolute: 0.4 10*3/uL (ref 0.1–1.0)
Monocytes Relative: 49 %
Neutro Abs: 0.1 10*3/uL — ABNORMAL LOW (ref 1.7–7.7)
Neutrophils Relative %: 18 %
Platelets: 7 10*3/uL — CL (ref 150–400)
RBC: 2.85 MIL/uL — ABNORMAL LOW (ref 3.87–5.11)
RDW: 19.9 % — ABNORMAL HIGH (ref 11.5–15.5)
WBC: 0.7 10*3/uL — CL (ref 4.0–10.5)
nRBC: 5.5 % — ABNORMAL HIGH (ref 0.0–0.2)

## 2019-02-14 LAB — SAMPLE TO BLOOD BANK

## 2019-02-14 LAB — LACTATE DEHYDROGENASE: LDH: 504 U/L — ABNORMAL HIGH (ref 98–192)

## 2019-02-14 LAB — PREPARE RBC (CROSSMATCH)

## 2019-02-14 MED ORDER — SODIUM CHLORIDE 0.9% IV SOLUTION
250.0000 mL | Freq: Once | INTRAVENOUS | Status: AC
  Administered 2019-02-14: 250 mL via INTRAVENOUS
  Filled 2019-02-14: qty 250

## 2019-02-14 MED ORDER — HEPARIN SOD (PORK) LOCK FLUSH 100 UNIT/ML IV SOLN
500.0000 [IU] | Freq: Once | INTRAVENOUS | Status: AC
  Administered 2019-02-14: 250 [IU] via INTRAVENOUS

## 2019-02-14 MED ORDER — DIPHENHYDRAMINE HCL 25 MG PO CAPS
25.0000 mg | ORAL_CAPSULE | Freq: Once | ORAL | Status: AC
  Administered 2019-02-14: 11:00:00 25 mg via ORAL
  Filled 2019-02-14: qty 1

## 2019-02-14 MED ORDER — HEPARIN SOD (PORK) LOCK FLUSH 100 UNIT/ML IV SOLN
INTRAVENOUS | Status: AC
  Filled 2019-02-14: qty 5

## 2019-02-14 MED ORDER — ACETAMINOPHEN 325 MG PO TABS
650.0000 mg | ORAL_TABLET | Freq: Once | ORAL | Status: AC
  Administered 2019-02-14: 11:00:00 650 mg via ORAL
  Filled 2019-02-14: qty 2

## 2019-02-14 MED ORDER — SODIUM CHLORIDE 0.9% FLUSH
10.0000 mL | Freq: Once | INTRAVENOUS | Status: AC
  Administered 2019-02-14: 09:00:00 10 mL via INTRAVENOUS
  Filled 2019-02-14: qty 10

## 2019-02-14 NOTE — Telephone Encounter (Signed)
Contacted pt's husband and results of anc and hgb and plts provided.

## 2019-02-14 NOTE — Telephone Encounter (Signed)
Husband called asking for results of todays lab work. Please return his call [phone]

## 2019-02-15 LAB — BPAM RBC
Blood Product Expiration Date: 202009292359
ISSUE DATE / TIME: 202009041055
Unit Type and Rh: 5100

## 2019-02-15 LAB — BPAM PLATELET PHERESIS
Blood Product Expiration Date: 202009062359
ISSUE DATE / TIME: 202009041306
Unit Type and Rh: 5100

## 2019-02-15 LAB — TYPE AND SCREEN
ABO/RH(D): O POS
Antibody Screen: NEGATIVE
Unit division: 0

## 2019-02-15 LAB — PREPARE PLATELET PHERESIS: Unit division: 0

## 2019-02-18 ENCOUNTER — Other Ambulatory Visit: Payer: Self-pay

## 2019-02-18 ENCOUNTER — Inpatient Hospital Stay: Payer: Medicare Other

## 2019-02-18 ENCOUNTER — Other Ambulatory Visit: Payer: Self-pay | Admitting: *Deleted

## 2019-02-18 ENCOUNTER — Inpatient Hospital Stay (HOSPITAL_BASED_OUTPATIENT_CLINIC_OR_DEPARTMENT_OTHER): Payer: Medicare Other | Admitting: Internal Medicine

## 2019-02-18 DIAGNOSIS — C9200 Acute myeloblastic leukemia, not having achieved remission: Secondary | ICD-10-CM

## 2019-02-18 LAB — COMPREHENSIVE METABOLIC PANEL
ALT: 14 U/L (ref 0–44)
AST: 18 U/L (ref 15–41)
Albumin: 3.5 g/dL (ref 3.5–5.0)
Alkaline Phosphatase: 77 U/L (ref 38–126)
Anion gap: 10 (ref 5–15)
BUN: 30 mg/dL — ABNORMAL HIGH (ref 8–23)
CO2: 28 mmol/L (ref 22–32)
Calcium: 9.1 mg/dL (ref 8.9–10.3)
Chloride: 100 mmol/L (ref 98–111)
Creatinine, Ser: 0.57 mg/dL (ref 0.44–1.00)
GFR calc Af Amer: >60 mL/min (ref >60)
GFR calc non Af Amer: >60 mL/min (ref >60)
Glucose, Bld: 134 mg/dL — ABNORMAL HIGH (ref 70–99)
Potassium: 3.5 mmol/L (ref 3.5–5.1)
Sodium: 138 mmol/L (ref 135–145)
Total Bilirubin: 0.9 mg/dL (ref 0.3–1.2)
Total Protein: 5.7 g/dL — ABNORMAL LOW (ref 6.5–8.1)

## 2019-02-18 LAB — LACTATE DEHYDROGENASE: LDH: 397 U/L — ABNORMAL HIGH (ref 98–192)

## 2019-02-18 LAB — CBC WITH DIFFERENTIAL/PLATELET
Abs Immature Granulocytes: 0 10*3/uL (ref 0.00–0.07)
Basophils Absolute: 0 10*3/uL (ref 0.0–0.1)
Basophils Relative: 0 %
Eosinophils Absolute: 0 10*3/uL (ref 0.0–0.5)
Eosinophils Relative: 0 %
HCT: 25.4 % — ABNORMAL LOW (ref 36.0–46.0)
Hemoglobin: 8.2 g/dL — ABNORMAL LOW (ref 12.0–15.0)
Immature Granulocytes: 0 %
Lymphocytes Relative: 46 %
Lymphs Abs: 0.3 10*3/uL — ABNORMAL LOW (ref 0.7–4.0)
MCH: 27.2 pg (ref 26.0–34.0)
MCHC: 32.3 g/dL (ref 30.0–36.0)
MCV: 84.4 fL (ref 80.0–100.0)
Monocytes Absolute: 0.3 10*3/uL (ref 0.1–1.0)
Monocytes Relative: 43 %
Neutro Abs: 0.1 10*3/uL — ABNORMAL LOW (ref 1.7–7.7)
Neutrophils Relative %: 11 %
Platelets: 5 10*3/uL — CL (ref 150–400)
RBC: 3.01 MIL/uL — ABNORMAL LOW (ref 3.87–5.11)
RDW: 18.7 % — ABNORMAL HIGH (ref 11.5–15.5)
Smear Review: NORMAL
WBC: 0.7 10*3/uL — CL (ref 4.0–10.5)
nRBC: 4.6 % — ABNORMAL HIGH (ref 0.0–0.2)

## 2019-02-18 LAB — SAMPLE TO BLOOD BANK

## 2019-02-18 MED ORDER — HEPARIN SOD (PORK) LOCK FLUSH 100 UNIT/ML IV SOLN
500.0000 [IU] | Freq: Once | INTRAVENOUS | Status: AC
  Administered 2019-02-18: 500 [IU] via INTRAVENOUS
  Filled 2019-02-18: qty 5

## 2019-02-18 MED ORDER — SODIUM CHLORIDE 0.9% IV SOLUTION
250.0000 mL | Freq: Once | INTRAVENOUS | Status: AC
  Administered 2019-02-18: 250 mL via INTRAVENOUS
  Filled 2019-02-18: qty 250

## 2019-02-18 MED ORDER — DIPHENHYDRAMINE HCL 25 MG PO CAPS
25.0000 mg | ORAL_CAPSULE | Freq: Once | ORAL | Status: AC
  Administered 2019-02-18: 25 mg via ORAL
  Filled 2019-02-18: qty 1

## 2019-02-18 MED ORDER — ACETAMINOPHEN 325 MG PO TABS
650.0000 mg | ORAL_TABLET | Freq: Once | ORAL | Status: AC
  Administered 2019-02-18: 13:00:00 650 mg via ORAL
  Filled 2019-02-18: qty 2

## 2019-02-18 MED ORDER — SODIUM CHLORIDE 0.9% FLUSH
10.0000 mL | INTRAVENOUS | Status: DC | PRN
  Administered 2019-02-18: 10 mL via INTRAVENOUS
  Filled 2019-02-18: qty 10

## 2019-02-18 NOTE — Assessment & Plan Note (Addendum)
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 Currently on cycle #1- Vidaza + Venatoclax.  Day #28 bone marrow biopsy-  7/14- BMx- NO BLASTS; positive for dysplastic changes.   # S/p Vidaza cycle #2 on 8/17; plus ven 100 mg/day; VEN- HELD since 8/24. Today day #22 today-white count 0.7 hemoglobin 8.3platelets 5.  Discontinue venetoclax/Vidaza.  Recommend starting Gilteritinib 80 mg a day.  Discussed regarding differentiation syndrome/nausea vomiting diarrhea also EKG changes.  Baseline EKG July normal.  Also discussed with Dr. Stoney Bang at [facility].  # AST elevation- 248-?sec to Posonoazole; RESOLVED; continue at lower dose 100 mg/day.   #Status post mechanical fall intracranial bleed-minor bleed; stable.  # Supportive care:  # Continue Levaquin/Noxafil at 143m/acyclovir # PICC dressing weekly  #Discussed with patient's husband-at length regarding patient's plan of care/labs etc. recommend starting gilteritinib 80 mg once a day.  Discussed with pharmacy. -------------------------------------------------------------------------------- # DISPOSITION:    # platelets today.  # cbc/hold tube on 9/11- -possible platelet/Blood transfusion # follow up MD - 09/15- cbc/cmp/LDH- HOLD tube/possible PRBC/ plaletetsDr.B

## 2019-02-18 NOTE — Progress Notes (Signed)
[place] CONSULT NOTE  Patient Care Team: Rusty Aus, MD as PCP - General (Internal Medicine)  CHIEF COMPLAINTS/PURPOSE OF CONSULTATION: Acute myeloid leukemia   Oncology History Overview Note  # SEP 2017- MYELOPROLIFERATIVE NEOPLASM- [WBC- 40; normal Hb/platelets] hypercellular bone marrow 90-95% proliferation of myeloid cells in various stages of maturation; relative erythroid hypoplasia and proliferation of atypical megakaryocytes; no increase in blasts; peripheral blood Bcr-Abl-NEG; Cytogenetics- WNL. NEG- Jak-2/MPL/CALR [place] limited- mild splenomegaly [~10cm; 463cm3]; OCT 2017- second opinion at [facility]. Surveillance.   # With progressive leukocytosis we evaluated her a month ago and sent BM for exam. She has a progressive leukocytosis so hydrea '500mg'$  daily was started on 10/20/2016 and increased to 2gm daily then back down to one daily, then 5 days per week over time due to counts drop. Then we held her hydrea since 04/09/2018 due to continued declining Hb and Plt.s  BMB 06/24/18 showed markedly hypercellular BM 95% with myeloid hyperplasia and atypical megakaryocytic hyperplasia. No significant increase in blasts. 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 BCR / ABL negative, FISH normal. Flow Showed 2%CD34-positive myeloid blasts. Myeloid precursors with low side scatter. Pathogenic variants were detected in the ASXL1, CCND2, CUX1, and U2AF1 genes.  # March 2020- wbc- 14/ Hb 9.5/ platelets- 54.   ---------------------------------------------------   # June 8th 2020- Acute myeloid leukemia [peripheral blood flow cytometry;NGS- pending]- June 15th Vidaza [SQ 1-5 + Venatoclax- #1in pt]; tumor lysis prophylaxis  # Day #28 bone marrow biopsy-  7/14- BMx- NO BLASTS; positive for dysplastic changes. Currently off Venatoclax [since 7/17] sec to cytopenias.  #817 cycle #2 Vidaza+ venetoclax 100 mg; stop venetoclax on  8/24 [severe pancytopenia]  # 02/18/2019-start gilteritinib 80 mg a day  # 2002 [[place]] BREAST CA s/p Lumpect RT; [? Stage I] no chemo s/p AI  # F-ONE HEM: FLT3 ALTERATION NOTED  DIAGNOSIS: ACUTE MYELOID LEUKEMIA GOALS: pallaitive  CURRENT/MOST RECENT THERAPY : Vidaza+ venotoclax.      MDS (myelodysplastic syndrome) ([place]) (Resolved)  08/28/2018 Initial Diagnosis   MDS (myelodysplastic syndrome) (HCC)   AML (acute myeloid leukemia) with failed remission ([place])  11/25/2018 Initial Diagnosis   AML (acute myeloid leukemia) with failed remission ([place])   11/25/2018 - 12/22/2018 Chemotherapy   The patient had azaCITIDine (VIDAZA) chemo injection 105 mg, 75 mg/m2 = 105 mg, Subcutaneous,  Once, 1 of 4 cycles Administration: 105 mg (11/25/2018), 105 mg (11/26/2018), 105 mg (11/27/2018), 105 mg (11/28/2018), 105 mg (11/29/2018)  for chemotherapy treatment.    01/27/2019 -  Chemotherapy   The patient had palonosetron (ALOXI) injection 0.25 mg, 0.25 mg, Intravenous,  Once, 1 of 4 cycles Administration: 0.25 mg (01/27/2019), 0.25 mg (01/29/2019), 0.25 mg (01/31/2019) azaCITIDine (VIDAZA) 100 mg in sodium chloride 0.9 % 50 mL chemo infusion, 75 mg/m2 = 100 mg, Intravenous, Once, 1 of 4 cycles Administration: 100 mg (01/27/2019), 100 mg (01/28/2019), 100 mg (01/29/2019), 100 mg (01/30/2019), 100 mg (01/31/2019)  for chemotherapy treatment.      HISTORY OF PRESENTING ILLNESS:  Kathryn Hahn 85 y.o.  female with acute myeloid leukemia with monocytic differentiation currently on Vidaza plus venetoclax is here for follow-up.  Patient received cycle #2 Vidaza plus venetoclax 3 week ago.  Venetoclax currently on hold since August 24.  Unfortunately patient has been receiving PRBC transfusion/platelet transfusion twice a week.  Patient's ANC is still 0.1.  However patient feels that she has been more active.  She has been evaluated orthopedics; stating her right shoulder is  healing well.  Chronic mild swelling  in the legs.  Mild shortness of breath on exertion.  No cough or fevers or chills no diarrhea no constipation.  Review of Systems  Constitutional: Positive for malaise/fatigue and weight loss. Negative for chills, diaphoresis and fever.  HENT: Negative for nosebleeds and sore throat.   Eyes: Negative for double vision.  Respiratory: Positive for shortness of breath. Negative for cough, hemoptysis, sputum production and wheezing.   Cardiovascular: Positive for leg swelling. Negative for chest pain, palpitations and orthopnea.       Right chest wall pain/rib pain status post fractures.  Gastrointestinal: Negative for abdominal pain, blood in stool, heartburn, melena, nausea and vomiting.  Genitourinary: Negative for dysuria, frequency and urgency.  Musculoskeletal: Negative for back pain and joint pain.  Skin: Negative.  Negative for itching and rash.  Neurological: Negative for dizziness, tingling, focal weakness, weakness and headaches.  Endo/Heme/Allergies: Bruises/bleeds easily.  Psychiatric/Behavioral: Negative for depression. The patient is not nervous/anxious and does not have insomnia.     MEDICAL HISTORY:  Past Medical History:  Diagnosis Date  . Anemia   . Arthritis   . Breast cancer ([place]) 2003   RT LUMPECTOMY  . Edema leg   . History of breast cancer 2003   post lumpectomy  . History of kidney stones   . Hyperlipemia, mixed   . Hypertension, essential   . Leukemia ([place])   . Leukocytosis   . MDS (myelodysplastic syndrome) ([place])   . Personal history of radiation therapy 2003   BREAST CA  . Renal stones   . Vitamin D deficiency     SURGICAL HISTORY: Past Surgical History:  Procedure Laterality Date  . BREAST BIOPSY Right 2003   Postive for cancer  . BREAST LUMPECTOMY Right 2003   BREAST CA  . KIDNEY STONE SURGERY Right     SOCIAL HISTORY: Social History   Socioeconomic History  . Marital status: Married    Spouse name: Not on file  . Number of children:  Not on file  . Years of education: Not on file  . Highest education level: Not on file  Occupational History  . Not on file  Social Needs  . Financial resource strain: Not on file  . Food insecurity    Worry: Not on file    Inability: Not on file  . Transportation needs    Medical: Not on file    Non-medical: Not on file  Tobacco Use  . Smoking status: Never Smoker  . Smokeless tobacco: Never Used  Substance and Sexual Activity  . Alcohol use: Yes    Alcohol/week: 3.0 standard drinks    Types: 3 Glasses of wine per week  . Drug use: No  . Sexual activity: Not on file  Lifestyle  . Physical activity    Days per week: Not on file    Minutes per session: Not on file  . Stress: Not on file  Relationships  . Social Herbalist on phone: Not on file    Gets together: Not on file    Attends religious service: Not on file    Active member of club or organization: Not on file    Attends meetings of clubs or organizations: Not on file    Relationship status: Not on file  . Intimate partner violence    Fear of current or ex partner: Not on file    Emotionally abused: Not on file    Physically abused: Not  on file    Forced sexual activity: Not on file  Other Topics Concern  . Not on file  Social History Narrative    No smoking/alcohol; lives in [place] with family.  She lives in assisted living with her husband.    FAMILY HISTORY: Family History  Problem Relation Age of Onset  . Stroke Mother   . Hypertension Father   . Stroke Father   . Breast cancer Neg Hx     ALLERGIES:  is allergic to terbinafine.  MEDICATIONS:  Current Outpatient Medications  Medication Sig Dispense Refill  . acetaminophen (TYLENOL) 325 MG tablet Take by mouth.    Marland Kitchen acyclovir (ZOVIRAX) 400 MG tablet One pill a day [to prevent shingles] 30 tablet 3  . atenolol (TENORMIN) 50 MG tablet Take 1 tablet by mouth 2 (two) times daily.    . calcium carbonate (OSCAL) 1500 (600 Ca) MG TABS  tablet Take 600 mg of elemental calcium by mouth daily with breakfast.    . Cholecalciferol (VITAMIN D) 2000 units tablet Take 1 tablet by mouth daily.    . heparin lock flush 100 UNIT/ML SOLN injection Inject into the vein.    . Infant Care Products (DERMACLOUD) CREA Apply topically.    . levETIRAcetam (KEPPRA) 250 MG tablet Take 1 tablet (250 mg total) by mouth every morning. 2 tablet 0  . levofloxacin (LEVAQUIN) 250 MG tablet Take 1 tablet (250 mg total) by mouth daily. 30 tablet 3  . losartan-hydrochlorothiazide (HYZAAR) 50-12.5 MG tablet Take 1 tablet by mouth daily.    . ondansetron (ZOFRAN) 8 MG tablet Take by mouth every 8 (eight) hours as needed for nausea or vomiting.    . polyethylene glycol (MIRALAX / GLYCOLAX) 17 g packet Take 17 g by mouth daily.    . potassium chloride SA (K-DUR) 20 MEQ tablet Take 1 tablet (20 mEq total) by mouth 2 (two) times daily. 10 tablet 0  . senna (SENOKOT) 8.6 MG tablet Take 2 tablets by mouth 2 (two) times daily.    . Gilteritinib Fumarate (XOSPATA) 40 MG TABS Take 80 mg by mouth daily. (Patient not taking: Reported on 02/07/2019) 60 tablet 2  . magic mouthwash w/lidocaine SOLN Take 5 mLs by mouth 4 (four) times daily as needed for mouth pain. (Patient not taking: Reported on 02/07/2019) 480 mL 3  . oxycodone (OXY-IR) 5 MG capsule Take 1 capsule (5 mg total) by mouth every 4 (four) hours as needed. (Patient not taking: Reported on 02/07/2019) 15 capsule 0  . posaconazole (NOXAFIL) 100 MG TBEC delayed-release tablet Take 2 tablets (200 mg total) by mouth daily. DO NOT START UNTIL-OK with MD. (Patient not taking: Reported on 02/07/2019) 60 tablet 3   No current facility-administered medications for this visit.    Facility-Administered Medications Ordered in Other Visits  Medication Dose Route Frequency Provider Last Rate Last Dose  . 0.9 %  sodium chloride infusion   Intravenous Continuous Jacquelin Hawking, NP   Stopped at 02/05/19 1630  . sodium chloride  flush (NS) 0.9 % injection 10 mL  10 mL Intravenous PRN Cammie Sickle, MD          .  PHYSICAL EXAMINATION: ECOG PERFORMANCE STATUS: 0 - Asymptomatic  Vitals:   02/18/19 1133  BP: 117/76  Pulse: 72  Resp: 18  Temp: 98.8 F (37.1 C)   Filed Weights   02/18/19 1133  Weight: 98 lb 6.4 oz (44.6 kg)    Physical Exam  Constitutional: She is oriented  to person, place, and time and well-developed, well-nourished, and in no distress.  Alone.  She is in a wheelchair.  HENT:  Head: Normocephalic and atraumatic.  Mouth/Throat: Oropharynx is clear and moist. No oropharyngeal exudate.  Eyes: Pupils are equal, round, and reactive to light.  Neck: Normal range of motion. Neck supple.  Cardiovascular: Normal rate and regular rhythm.  Pulmonary/Chest: No respiratory distress. She has no wheezes.  Abdominal: Soft. Bowel sounds are normal. She exhibits no distension and no mass. There is no abdominal tenderness. There is no rebound and no guarding.  Musculoskeletal: Normal range of motion.        General: Edema present. No tenderness.     Comments: Right upper extremity in sling.   Neurological: She is alert and oriented to person, place, and time.  Skin: Skin is warm.  Multiple bruises noted.  Positive for mild bleeding around the site of the PICC line insertion.  Multiple abdominal wall bruises noted.  Psychiatric: Affect normal.  ;   LABORATORY DATA:  I have reviewed the data as listed Lab Results  Component Value Date   WBC 0.7 (LL) 02/18/2019   HGB 8.2 (L) 02/18/2019   HCT 25.4 (L) 02/18/2019   MCV 84.4 02/18/2019   PLT 5 (LL) 02/18/2019   Recent Labs    02/07/19 0830 02/10/19 0855 02/14/19 0922 02/18/19 1058  NA 137 134* 138 138  K 3.0* 3.2* 3.4* 3.5  CL 98 98 100 100  CO2 '27 28 29 28  '$ GLUCOSE 207* 212* 152* 134*  BUN 35* 30* 27* 30*  CREATININE 0.63 0.59 0.53 0.57  CALCIUM 8.9 8.7* 9.0 9.1  GFRNONAA >60 >60 >60 >60  GFRAA >60 >60 >60 >60  PROT 5.9*  5.7*  --  5.7*  ALBUMIN 3.3* 3.2*  --  3.5  AST 60* 36  --  18  ALT 23 24  --  14  ALKPHOS 71 74  --  77  BILITOT 0.9 0.9  --  0.9    RADIOGRAPHIC STUDIES: I have personally reviewed the radiological images as listed and agreed with the findings in the report. No results found.  ASSESSMENT & PLAN:   AML (acute myeloid leukemia) with failed remission (HCC) #Acute myeloid leukemia with monocytic differentiation [20% blasts on peripheral blood flow cytometry]. Currently on cycle #1- Vidaza + Venatoclax.  Day #28 bone marrow biopsy-  7/14- BMx- NO BLASTS; positive for dysplastic changes.   # S/p Vidaza cycle #2 on 8/17; plus ven 100 mg/day; VEN- HELD since 8/24. Today day #22 today-white count 0.7 hemoglobin 8.3platelets 5.  Discontinue venetoclax/Vidaza.  Recommend starting Gilteritinib 80 mg a day.  Discussed regarding differentiation syndrome/nausea vomiting diarrhea also EKG changes.  Baseline EKG July normal.  Also discussed with Dr. Stoney Bang at [facility].  # AST elevation- 248-?sec to Posonoazole; RESOLVED; continue at lower dose 100 mg/day.   #Status post mechanical fall intracranial bleed-minor bleed; stable.  # Supportive care:  # Continue Levaquin/Noxafil at '100mg'$ /acyclovir # PICC dressing weekly  #Discussed with patient's husband-at length regarding patient's plan of care/labs etc. recommend starting gilteritinib 80 mg once a day.  Discussed with pharmacy. -------------------------------------------------------------------------------- # DISPOSITION:    # platelets today.  # cbc/hold tube on 9/11- -possible platelet/Blood transfusion # follow up MD - 09/15- cbc/cmp/LDH- HOLD tube/possible PRBC/ plaletetsDr.B    Cammie Sickle, MD 02/18/2019 3:09 PM

## 2019-02-19 ENCOUNTER — Other Ambulatory Visit: Payer: Self-pay

## 2019-02-19 DIAGNOSIS — C9200 Acute myeloblastic leukemia, not having achieved remission: Secondary | ICD-10-CM

## 2019-02-19 LAB — PREPARE PLATELET PHERESIS: Unit division: 0

## 2019-02-19 LAB — BPAM PLATELET PHERESIS
Blood Product Expiration Date: 202009112359
ISSUE DATE / TIME: 202009081329
Unit Type and Rh: 7300

## 2019-02-20 ENCOUNTER — Other Ambulatory Visit: Payer: Self-pay | Admitting: *Deleted

## 2019-02-20 ENCOUNTER — Other Ambulatory Visit: Payer: Self-pay

## 2019-02-20 DIAGNOSIS — C9200 Acute myeloblastic leukemia, not having achieved remission: Secondary | ICD-10-CM

## 2019-02-21 ENCOUNTER — Other Ambulatory Visit: Payer: Self-pay

## 2019-02-21 ENCOUNTER — Inpatient Hospital Stay: Payer: Medicare Other

## 2019-02-21 ENCOUNTER — Other Ambulatory Visit: Payer: Self-pay | Admitting: *Deleted

## 2019-02-21 VITALS — BP 103/61 | HR 70 | Temp 96.0°F | Resp 18

## 2019-02-21 DIAGNOSIS — C9200 Acute myeloblastic leukemia, not having achieved remission: Secondary | ICD-10-CM | POA: Diagnosis not present

## 2019-02-21 LAB — CBC WITH DIFFERENTIAL/PLATELET
Abs Immature Granulocytes: 0.03 10*3/uL (ref 0.00–0.07)
Basophils Absolute: 0 10*3/uL (ref 0.0–0.1)
Basophils Relative: 0 %
Eosinophils Absolute: 0 10*3/uL (ref 0.0–0.5)
Eosinophils Relative: 0 %
HCT: 21.9 % — ABNORMAL LOW (ref 36.0–46.0)
Hemoglobin: 7.2 g/dL — ABNORMAL LOW (ref 12.0–15.0)
Immature Granulocytes: 6 %
Lymphocytes Relative: 36 %
Lymphs Abs: 0.2 10*3/uL — ABNORMAL LOW (ref 0.7–4.0)
MCH: 27.7 pg (ref 26.0–34.0)
MCHC: 32.9 g/dL (ref 30.0–36.0)
MCV: 84.2 fL (ref 80.0–100.0)
Monocytes Absolute: 0.3 10*3/uL (ref 0.1–1.0)
Monocytes Relative: 50 %
Neutro Abs: 0 10*3/uL — ABNORMAL LOW (ref 1.7–7.7)
Neutrophils Relative %: 8 %
Platelets: 5 10*3/uL — CL (ref 150–400)
RBC: 2.6 MIL/uL — ABNORMAL LOW (ref 3.87–5.11)
RDW: 18.6 % — ABNORMAL HIGH (ref 11.5–15.5)
Smear Review: DECREASED
WBC: 0.5 10*3/uL — CL (ref 4.0–10.5)
nRBC: 12 % — ABNORMAL HIGH (ref 0.0–0.2)

## 2019-02-21 LAB — SAMPLE TO BLOOD BANK

## 2019-02-21 LAB — PREPARE RBC (CROSSMATCH)

## 2019-02-21 MED ORDER — HEPARIN SOD (PORK) LOCK FLUSH 100 UNIT/ML IV SOLN
250.0000 [IU] | INTRAVENOUS | Status: AC | PRN
  Administered 2019-02-21: 15:00:00 250 [IU]
  Filled 2019-02-21: qty 5

## 2019-02-21 MED ORDER — ACETAMINOPHEN 325 MG PO TABS
650.0000 mg | ORAL_TABLET | Freq: Once | ORAL | Status: AC
  Administered 2019-02-21: 650 mg via ORAL
  Filled 2019-02-21: qty 2

## 2019-02-21 MED ORDER — SODIUM CHLORIDE 0.9% FLUSH
10.0000 mL | INTRAVENOUS | Status: AC | PRN
  Administered 2019-02-21: 15:00:00 10 mL
  Filled 2019-02-21: qty 10

## 2019-02-21 MED ORDER — SODIUM CHLORIDE 0.9% IV SOLUTION
250.0000 mL | Freq: Once | INTRAVENOUS | Status: AC
  Administered 2019-02-21: 250 mL via INTRAVENOUS
  Filled 2019-02-21: qty 250

## 2019-02-21 MED ORDER — SODIUM CHLORIDE 0.9% FLUSH
10.0000 mL | INTRAVENOUS | Status: AC | PRN
  Administered 2019-02-21: 10 mL
  Filled 2019-02-21: qty 10

## 2019-02-21 MED ORDER — SODIUM CHLORIDE 0.9% FLUSH
10.0000 mL | INTRAVENOUS | Status: AC | PRN
  Administered 2019-02-21: 13:00:00 10 mL
  Filled 2019-02-21: qty 10

## 2019-02-21 MED ORDER — DIPHENHYDRAMINE HCL 25 MG PO CAPS
25.0000 mg | ORAL_CAPSULE | Freq: Once | ORAL | Status: AC
  Administered 2019-02-21: 25 mg via ORAL
  Filled 2019-02-21: qty 1

## 2019-02-22 LAB — TYPE AND SCREEN
ABO/RH(D): O POS
Antibody Screen: NEGATIVE
Unit division: 0

## 2019-02-22 LAB — BPAM RBC
Blood Product Expiration Date: 202009292359
ISSUE DATE / TIME: 202009111253
Unit Type and Rh: 5100

## 2019-02-22 LAB — BPAM PLATELET PHERESIS
Blood Product Expiration Date: 202009142359
ISSUE DATE / TIME: 202009111155
Unit Type and Rh: 6200

## 2019-02-22 LAB — PREPARE PLATELET PHERESIS: Unit division: 0

## 2019-02-24 ENCOUNTER — Inpatient Hospital Stay: Payer: Medicare Other

## 2019-02-24 ENCOUNTER — Encounter: Payer: Self-pay | Admitting: Oncology

## 2019-02-24 ENCOUNTER — Other Ambulatory Visit: Payer: Self-pay

## 2019-02-24 ENCOUNTER — Telehealth: Payer: Self-pay | Admitting: *Deleted

## 2019-02-24 ENCOUNTER — Inpatient Hospital Stay (HOSPITAL_BASED_OUTPATIENT_CLINIC_OR_DEPARTMENT_OTHER): Payer: Medicare Other | Admitting: Oncology

## 2019-02-24 ENCOUNTER — Inpatient Hospital Stay: Payer: Medicare Other | Admitting: *Deleted

## 2019-02-24 ENCOUNTER — Telehealth: Payer: Self-pay | Admitting: Internal Medicine

## 2019-02-24 VITALS — BP 118/61 | HR 70 | Temp 97.1°F | Resp 18

## 2019-02-24 DIAGNOSIS — C9200 Acute myeloblastic leukemia, not having achieved remission: Secondary | ICD-10-CM

## 2019-02-24 DIAGNOSIS — Z452 Encounter for adjustment and management of vascular access device: Secondary | ICD-10-CM

## 2019-02-24 LAB — SAMPLE TO BLOOD BANK

## 2019-02-24 LAB — CBC WITH DIFFERENTIAL/PLATELET
Abs Immature Granulocytes: 0.01 10*3/uL (ref 0.00–0.07)
Basophils Absolute: 0 10*3/uL (ref 0.0–0.1)
Basophils Relative: 0 %
Eosinophils Absolute: 0 10*3/uL (ref 0.0–0.5)
Eosinophils Relative: 0 %
HCT: 24.9 % — ABNORMAL LOW (ref 36.0–46.0)
Hemoglobin: 8 g/dL — ABNORMAL LOW (ref 12.0–15.0)
Immature Granulocytes: 3 %
Lymphocytes Relative: 52 %
Lymphs Abs: 0.2 10*3/uL — ABNORMAL LOW (ref 0.7–4.0)
MCH: 27.1 pg (ref 26.0–34.0)
MCHC: 32.1 g/dL (ref 30.0–36.0)
MCV: 84.4 fL (ref 80.0–100.0)
Monocytes Absolute: 0.1 10*3/uL (ref 0.1–1.0)
Monocytes Relative: 34 %
Neutro Abs: 0 10*3/uL — ABNORMAL LOW (ref 1.7–7.7)
Neutrophils Relative %: 11 %
Platelets: <5 10*3/uL — CL (ref 150–400)
RBC: 2.95 MIL/uL — ABNORMAL LOW (ref 3.87–5.11)
RDW: 18 % — ABNORMAL HIGH (ref 11.5–15.5)
WBC: 0.4 10*3/uL — CL (ref 4.0–10.5)
nRBC: 10.5 % — ABNORMAL HIGH (ref 0.0–0.2)

## 2019-02-24 LAB — COMPREHENSIVE METABOLIC PANEL
ALT: 14 U/L (ref 0–44)
AST: 21 U/L (ref 15–41)
Albumin: 3.5 g/dL (ref 3.5–5.0)
Alkaline Phosphatase: 94 U/L (ref 38–126)
Anion gap: 8 (ref 5–15)
BUN: 25 mg/dL — ABNORMAL HIGH (ref 8–23)
CO2: 29 mmol/L (ref 22–32)
Calcium: 8.9 mg/dL (ref 8.9–10.3)
Chloride: 100 mmol/L (ref 98–111)
Creatinine, Ser: 0.51 mg/dL (ref 0.44–1.00)
GFR calc Af Amer: >60 mL/min (ref >60)
GFR calc non Af Amer: >60 mL/min (ref >60)
Glucose, Bld: 174 mg/dL — ABNORMAL HIGH (ref 70–99)
Potassium: 3.2 mmol/L — ABNORMAL LOW (ref 3.5–5.1)
Sodium: 137 mmol/L (ref 135–145)
Total Bilirubin: 0.7 mg/dL (ref 0.3–1.2)
Total Protein: 6.2 g/dL — ABNORMAL LOW (ref 6.5–8.1)

## 2019-02-24 LAB — LACTATE DEHYDROGENASE: LDH: 364 U/L — ABNORMAL HIGH (ref 98–192)

## 2019-02-24 MED ORDER — HEPARIN SOD (PORK) LOCK FLUSH 100 UNIT/ML IV SOLN
250.0000 [IU] | INTRAVENOUS | Status: AC | PRN
  Administered 2019-02-24: 250 [IU]
  Filled 2019-02-24: qty 5

## 2019-02-24 MED ORDER — DIPHENHYDRAMINE HCL 25 MG PO CAPS
25.0000 mg | ORAL_CAPSULE | Freq: Once | ORAL | Status: AC
  Administered 2019-02-24: 25 mg via ORAL
  Filled 2019-02-24: qty 1

## 2019-02-24 MED ORDER — ACETAMINOPHEN 325 MG PO TABS
650.0000 mg | ORAL_TABLET | Freq: Once | ORAL | Status: AC
  Administered 2019-02-24: 13:00:00 650 mg via ORAL
  Filled 2019-02-24: qty 2

## 2019-02-24 MED ORDER — SODIUM CHLORIDE 0.9% FLUSH
10.0000 mL | Freq: Once | INTRAVENOUS | Status: AC
  Administered 2019-02-24: 11:00:00 10 mL via INTRAVENOUS
  Filled 2019-02-24: qty 10

## 2019-02-24 MED ORDER — SODIUM CHLORIDE 0.9% IV SOLUTION
250.0000 mL | Freq: Once | INTRAVENOUS | Status: AC
  Administered 2019-02-24: 13:00:00 250 mL via INTRAVENOUS
  Filled 2019-02-24: qty 250

## 2019-02-24 NOTE — Telephone Encounter (Signed)
Spoke with Sonia Baller, NP. Infusion unable to do 2 units of plts today. Only could infusion 1 unit of plts. Per NP- patient should keep her scheduled apt on Tuesday for the remaining unit.  Per NP, additional apts changed from wed 9/16 to 9/17

## 2019-02-24 NOTE — Progress Notes (Signed)
Symptom Management Consult note [facility]  [phone] [phone]  Patient Care Team: Rusty Aus, MD as PCP - General (Internal Medicine)   Name of the patient: Kathryn Hahn  [id number]  08-11-1933   Date of visit: 02/24/2019   Diagnosis- 1. AML (acute myeloid leukemia) with failed remission [facility]) - Practitioner attestation of consent; Future - Complete patient signature process for consent form; Future - Type and screen; Future - Care order/instruction; Future - 0.9 %  sodium chloride infusion (Manually program via Guardrails IV Fluids) - sodium chloride flush (NS) 0.9 % injection 10 mL - heparin lock flush 100 unit/mL - heparin lock flush 100 unit/mL - sodium chloride flush (NS) 0.9 % injection 3 mL - diphenhydrAMINE (BENADRYL) capsule 25 mg - acetaminophen (TYLENOL) tablet 650 mg  Other orders - Prepare Pheresed Platelets; Standing - Transfuse platelet pheresis; Standing   Chief complaint/ Reason for visit- Bleeding/bruising  Heme/Onc history:  Oncology History Overview Note  # SEP 2017- MYELOPROLIFERATIVE NEOPLASM- [WBC- 97; normal Hb/platelets] hypercellular bone marrow 90-95% proliferation of myeloid cells in various stages of maturation; relative erythroid hypoplasia and proliferation of atypical megakaryocytes; no increase in blasts; peripheral blood Bcr-Abl-NEG; Cytogenetics- WNL. NEG- Jak-2/MPL/CALR [place] limited- mild splenomegaly [~10cm; 463cm3]; OCT 2017- second opinion at [place]. Surveillance.   # With progressive leukocytosis we evaluated her a month ago and sent BM for exam. She has a progressive leukocytosis so hydrea 563m daily was started on 10/20/2016 and increased to 2gm daily then back down to one daily, then 5 days per week over time due to counts drop. Then we held her hydrea since 04/09/2018 due to continued declining Hb and Plt.s  BMB 06/24/18 showed markedly hypercellular BM 95% with  myeloid hyperplasia and atypical megakaryocytic hyperplasia. No significant increase in blasts. 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 BCR / ABL negative, FISH normal. Flow Showed 2%CD34-positive myeloid blasts. Myeloid precursors with low side scatter. Pathogenic variants were detected in the ASXL1, CCND2, CUX1, and U2AF1 genes.  # March 2020- wbc- 14/ Hb 9.5/ platelets- 54.   ---------------------------------------------------   # June 8th 2020- Acute myeloid leukemia [peripheral blood flow cytometry;NGS- pending]- June 15th Vidaza [SQ 1-5 + Venatoclax- #1in pt]; tumor lysis prophylaxis  # Day #28 bone marrow biopsy-  7/14- BMx- NO BLASTS; positive for dysplastic changes. Currently off Venatoclax [since 7/17] sec to cytopenias.  #817 cycle #2 Vidaza+ venetoclax 100 mg; stop venetoclax on 8/24 [severe pancytopenia]  # 02/18/2019-start gilteritinib 80 mg a day  # 2002 [[place]] BREAST CA s/p Lumpect RT; [? Stage I] no chemo s/p AI  # F-ONE HEM: FLT3 ALTERATION NOTED  DIAGNOSIS: ACUTE MYELOID LEUKEMIA GOALS: pallaitive  CURRENT/MOST RECENT THERAPY : Vidaza+ venotoclax.      [place] (my[place]odysplastic syndrome) [place]  08/28/2018 Initial Diagnosis   MDS (myelodysplastic syndrome) (HCC)   AML (acute myeloid leukemia) with failed remission [place]  11/25/2018 Initial Diagnosis   AML (acute myeloid leukemia) with failed remission [place]   11/25/2018 - 12/22/2018 Chemotherapy   The patient had azaCITIDine (VIDAZA) chemo injection 105 mg, 75 mg/m2 = 105 mg, Subcutaneous,  Once, 1 of 4 cycles Administration: 105 mg (11/25/2018), 105 mg (11/26/2018), 105 mg (11/27/2018), 105 mg (11/28/2018), 105 mg (11/29/2018)  for chemotherapy treatment.    01/27/2019 -  Chemotherapy   The patient had palonosetron (ALOXI) injection 0.25 mg, 0.25 mg, Intravenous,  Once, 1 of 4 cycles Administration: 0.25 mg (01/27/2019), 0.25 mg (01/29/2019), 0.25  mg  (01/31/2019) azaCITIDine (VIDAZA) 100 mg in sodium chloride 0.9 % 50 mL chemo infusion, 75 mg/m2 = 100 mg, Intravenous, Once, 1 of 4 cycles Administration: 100 mg (01/27/2019), 100 mg (01/28/2019), 100 mg (01/29/2019), 100 mg (01/30/2019), 100 mg (01/31/2019)  for chemotherapy treatment.     Interval history-patient presents to [facility] today for complaints of bruising to right chest.  This was spontaneous and not due to injury.  She noticed spots yesterday and have progressively become worse.  Her brother is a physician and suggested she call our clinic to check her blood work specifically her platelet count.  She denies any bleeding, hematuria, hematochezia, melena, nosebleeds or hemoptysis.  She states "I feel pretty good".  She continues to live in assisted living facility.  Her husband lives independently and she is hoping to move back in with him as soon as possible.  She denies any nausea, vomiting, constipation or diarrhea.  She denies any urinary issues.  ECOG FS:3 - Symptomatic, >50% confined to bed  Review of systems- Review of Systems  Constitutional: Positive for malaise/fatigue.       Frail/pale  Neurological: Positive for weakness.  Endo/Heme/Allergies: Bruises/bleeds easily (to right chest).     Current treatment- Cycle 2 Vidaza plus venetoclax.  Recently began Gilteritinib.   Allergies  Allergen Reactions  . Terbinafine Rash and Swelling     Past Medical History:  Diagnosis Date  . Anemia   . Arthritis   . Breast cancer ([place]) 2003   RT LUMPECTOMY  . Edema leg   . History of breast cancer 2003   post lumpectomy  . History of kidney stones   . Hyperlipemia, mixed   . Hypertension, essential   . Leukemia ([place])   . Leukocytosis   . MDS (myelodysplastic syndrome) ([place])   . Personal history of radiation therapy 2003   BREAST CA  . Renal stones   . Vitamin D deficiency      Past Surgical History:  Procedure Laterality Date  . BREAST BIOPSY Right 2003   Postive for  cancer  . BREAST LUMPECTOMY Right 2003   BREAST CA  . KIDNEY STONE SURGERY Right     Social History   Socioeconomic History  . Marital status: Married    Spouse name: Not on file  . Number of children: Not on file  . Years of education: Not on file  . Highest education level: Not on file  Occupational History  . Not on file  Social Needs  . Financial resource strain: Not on file  . Food insecurity    Worry: Not on file    Inability: Not on file  . Transportation needs    Medical: Not on file    Non-medical: Not on file  Tobacco Use  . Smoking status: Never Smoker  . Smokeless tobacco: Never Used  Substance and Sexual Activity  . Alcohol use: Yes    Alcohol/week: 3.0 standard drinks    Types: 3 Glasses of wine per week  . Drug use: No  . Sexual activity: Not on file  Lifestyle  . Physical activity    Days per week: Not on file    Minutes per session: Not on file  . Stress: Not on file  Relationships  . Social Herbalist on phone: Not on file    Gets together: Not on file    Attends religious service: Not on file    Active member of club or organization: Not on  file    Attends meetings of clubs or organizations: Not on file    Relationship status: Not on file  . Intimate partner violence    Fear of current or ex partner: Not on file    Emotionally abused: Not on file    Physically abused: Not on file    Forced sexual activity: Not on file  Other Topics Concern  . Not on file  Social History Narrative    No smoking/alcohol; lives in [place] with family.  She lives in assisted living with her husband.    Family History  Problem Relation Age of Onset  . Stroke Mother   . Hypertension Father   . Stroke Father   . Breast cancer Neg Hx      Current Outpatient Medications:  .  acetaminophen (TYLENOL) 325 MG tablet, Take by mouth., Disp: , Rfl:  .  acyclovir (ZOVIRAX) 400 MG tablet, One pill a day [to prevent shingles], Disp: 30 tablet, Rfl: 3 .   atenolol (TENORMIN) 50 MG tablet, Take 1 tablet by mouth 2 (two) times daily., Disp: , Rfl:  .  calcium carbonate (OSCAL) 1500 (600 Ca) MG TABS tablet, Take 600 mg of elemental calcium by mouth daily with breakfast., Disp: , Rfl:  .  Cholecalciferol (VITAMIN D) 2000 units tablet, Take 1 tablet by mouth daily., Disp: , Rfl:  .  Gilteritinib Fumarate (XOSPATA) 40 MG TABS, Take 80 mg by mouth daily., Disp: 60 tablet, Rfl: 2 .  heparin lock flush 100 UNIT/ML SOLN injection, Inject into the vein., Disp: , Rfl:  .  Infant Care Products (DERMACLOUD) CREA, Apply topically., Disp: , Rfl:  .  levETIRAcetam (KEPPRA) 250 MG tablet, Take 1 tablet (250 mg total) by mouth every morning., Disp: 2 tablet, Rfl: 0 .  levofloxacin (LEVAQUIN) 250 MG tablet, Take 1 tablet (250 mg total) by mouth daily., Disp: 30 tablet, Rfl: 3 .  losartan-hydrochlorothiazide (HYZAAR) 50-12.5 MG tablet, Take 1 tablet by mouth daily., Disp: , Rfl:  .  potassium chloride SA (K-DUR) 20 MEQ tablet, Take 1 tablet (20 mEq total) by mouth 2 (two) times daily., Disp: 10 tablet, Rfl: 0 .  senna (SENOKOT) 8.6 MG tablet, Take 2 tablets by mouth 2 (two) times daily., Disp: , Rfl:  .  magic mouthwash w/lidocaine SOLN, Take 5 mLs by mouth 4 (four) times daily as needed for mouth pain. (Patient not taking: Reported on 02/07/2019), Disp: 480 mL, Rfl: 3 .  ondansetron (ZOFRAN) 8 MG tablet, Take by mouth every 8 (eight) hours as needed for nausea or vomiting., Disp: , Rfl:  .  oxycodone (OXY-IR) 5 MG capsule, Take 1 capsule (5 mg total) by mouth every 4 (four) hours as needed. (Patient not taking: Reported on 02/07/2019), Disp: 15 capsule, Rfl: 0 .  polyethylene glycol (MIRALAX / GLYCOLAX) 17 g packet, Take 17 g by mouth daily., Disp: , Rfl:  .  posaconazole (NOXAFIL) 100 MG TBEC delayed-release tablet, Take 2 tablets (200 mg total) by mouth daily. DO NOT START UNTIL-OK with MD. (Patient not taking: Reported on 02/07/2019), Disp: 60 tablet, Rfl: 3 No current  facility-administered medications for this visit.   Facility-Administered Medications Ordered in Other Visits:  .  0.9 %  sodium chloride infusion, , Intravenous, Continuous, , Wandra Feinstein, NP, Stopped at 02/05/19 1630  Physical exam:  Vitals:   02/24/19 1102  BP: 118/61  Pulse: 70  Resp: 18  Temp: (!) 97.1 F (36.2 C)  TempSrc: Tympanic   Physical Exam Vitals  signs reviewed.  Cardiovascular:     Rate and Rhythm: Normal rate.  Abdominal:     General: Bowel sounds are normal.  Musculoskeletal: Normal range of motion.  Skin:    Coloration: Skin is pale.     Comments: bruising/petechiae noted to right chest Petechiae to right arm  Neurological:     Mental Status: She is alert.      CMP Latest Ref Rng & Units 02/24/2019  Glucose 70 - 99 mg/dL 174(H)  BUN 8 - 23 mg/dL 25(H)  Creatinine 0.44 - 1.00 mg/dL 0.51  Sodium 135 - 145 mmol/L 137  Potassium 3.5 - 5.1 mmol/L 3.2(L)  Chloride 98 - 111 mmol/L 100  CO2 22 - 32 mmol/L 29  Calcium 8.9 - 10.3 mg/dL 8.9  Total Protein 6.5 - 8.1 g/dL 6.2(L)  Total Bilirubin 0.3 - 1.2 mg/dL 0.7  Alkaline Phos 38 - 126 U/L 94  AST 15 - 41 U/L 21  ALT 0 - 44 U/L 14   CBC Latest Ref Rng & Units 02/24/2019  WBC 4.0 - 10.5 K/uL 0.4(LL)  Hemoglobin 12.0 - 15.0 g/dL 8.0(L)  Hematocrit 36.0 - 46.0 % 24.9(L)  Platelets 150 - 400 K/uL <5(LL)    No images are attached to the encounter.  No results found.   Assessment and plan- Patient is a 85 y.o. female who presents to [facility] for complaints of easy bruising and petechiae to right chest wall and scattered on right arm.  AML: She was diagnosed approximately 3 months ago after PCP noticed progressive leukocytosis who subsequently was sent for bone marrow.  She was started on Hydrea 500 mg daily in May 2018 until blood counts started to drop.  Bone marrow did not reveal any significant increase in blasts.  Diagnosis favored to be myelodysplastic or myeloproliferative neoplasm.  Counts continue to  decline in June 2020 she was diagnosed with acute myeloid leukemia by peripheral flow cytometry and started on 5 days of venetoclax.  Bone marrow biopsy on 12/24/2018 revealed no blasts was positive for dysplastic changes.  Her last treatment was several weeks ago where she received cycle 2 Vidaza and was instructed to stop due to cytopenias.  She has been receiving PRBC transfusions/platelets transfusion twice a week.  Venetoclax and Vidaza was discontinued due to sustained cytopenias.  She was switched to Gilteritnib 80 mg a day which she is currently on.  Bleeding/bruising/petechiae: Likely due to thrombocytopenia from treatment for leukemia.  Previously, on venetoclax and Vidaza but discontinued due to persistent cytopenia.  Recently switched to gilteritinib which she is tolerating well.   Thrombocytopenia/anemia/neutropenia: Due to current treatment.  See plan below for transfusions.  She may require Granix during next visit if [place] continues to be 0.  She has received this in the past.  Plan: Transfuse 1 unit platelets today. Hold gilteritinib until counts improve.  Disposition: RTC tomorrow for additional platelets and possible Granix. Return to clinic on Thursday for lab work plus or minus platelets/packed red blood cells and possible growth factor.  Visit Diagnosis 1. AML (acute myeloid leukemia) with failed remission ([place])     Patient expressed understanding and was in agreement with this plan. She also understands that She can call clinic at any time with any questions, concerns, or complaints.   Greater than 50% was spent in counseling and coordination of care with this patient including but not limited to discussion of the relevant topics above (See A&P) including, but not limited to diagnosis and management of acute  and chronic medical conditions.   Thank you for allowing me to participate in the care of this very pleasant patient.    Jacquelin Hawking, NP [place] at [facility] Cell - [phone] Pager- [phone] 02/24/2019 11:25 AM

## 2019-02-24 NOTE — Telephone Encounter (Signed)
Spoke with Mr Sigler, He will try to get her to center asap, but he has to go get her from the nursing facility and may be a little later than 1015

## 2019-02-24 NOTE — Telephone Encounter (Signed)
I Spoke to patient husband regarding the low blood counts [ANC 0; hemoglobin 8; platelets is 1 5]-likely secondary to medication effect.  Recommend discontinuation of gilteritinib at this time.   We will monitor closely-then restart gilteritinib once platelets are stable/white count is improved.  Husband states that he will inform the rehab facility of the recommendations.  We will follow closely as planned.  FYI-[place].

## 2019-02-24 NOTE — Telephone Encounter (Signed)
Per Dr. Jacinto Reap - have patient seen [facility] NP today. - cbc, hold tube  Have patient here ASAP by 1015 am for labs and see Sonia Baller, NP at 108 am per [place], [place]

## 2019-02-24 NOTE — Telephone Encounter (Signed)
Patient husband called asking that Kathryn Hahn be seen today instead of tomorrowas she has "blood spots" on her and thinks her platelets are too low. Please advise

## 2019-02-25 ENCOUNTER — Other Ambulatory Visit: Payer: Self-pay

## 2019-02-25 ENCOUNTER — Other Ambulatory Visit: Payer: Self-pay | Admitting: *Deleted

## 2019-02-25 ENCOUNTER — Inpatient Hospital Stay: Payer: Medicare Other

## 2019-02-25 ENCOUNTER — Inpatient Hospital Stay: Payer: Medicare Other | Admitting: Internal Medicine

## 2019-02-25 DIAGNOSIS — C9200 Acute myeloblastic leukemia, not having achieved remission: Secondary | ICD-10-CM | POA: Diagnosis not present

## 2019-02-25 MED ORDER — SODIUM CHLORIDE 0.9% IV SOLUTION
250.0000 mL | Freq: Once | INTRAVENOUS | Status: AC
  Administered 2019-02-25: 250 mL via INTRAVENOUS
  Filled 2019-02-25: qty 250

## 2019-02-25 MED ORDER — SODIUM CHLORIDE 0.9% FLUSH
10.0000 mL | INTRAVENOUS | Status: AC | PRN
  Administered 2019-02-25: 11:00:00 10 mL
  Filled 2019-02-25: qty 10

## 2019-02-25 MED ORDER — DIPHENHYDRAMINE HCL 25 MG PO CAPS
25.0000 mg | ORAL_CAPSULE | Freq: Once | ORAL | Status: AC
  Administered 2019-02-25: 10:00:00 25 mg via ORAL
  Filled 2019-02-25: qty 1

## 2019-02-25 MED ORDER — ACETAMINOPHEN 325 MG PO TABS
650.0000 mg | ORAL_TABLET | Freq: Once | ORAL | Status: AC
  Administered 2019-02-25: 10:00:00 650 mg via ORAL
  Filled 2019-02-25: qty 2

## 2019-02-25 MED ORDER — HEPARIN SOD (PORK) LOCK FLUSH 100 UNIT/ML IV SOLN
250.0000 [IU] | INTRAVENOUS | Status: AC | PRN
  Administered 2019-02-25: 11:00:00 250 [IU]
  Filled 2019-02-25: qty 5

## 2019-02-25 MED ORDER — SODIUM CHLORIDE 0.9% FLUSH
10.0000 mL | INTRAVENOUS | Status: AC | PRN
  Administered 2019-02-25: 10:00:00 10 mL
  Filled 2019-02-25: qty 10

## 2019-02-25 NOTE — Telephone Encounter (Signed)
Oral Chemotherapy Pharmacist Encounter   Okay, I will have the pharmacy place on profile on hold for now. Just let us know if they need to resume calling her for refills.  Darl Pikes, PharmD, BCPS, [facility] Hematology/Oncology Clinical Pharmacist [facility]/AP [facility] [phone]  02/25/2019 10:15 AM

## 2019-02-26 ENCOUNTER — Other Ambulatory Visit: Payer: Medicare Other

## 2019-02-26 ENCOUNTER — Encounter: Payer: Medicare Other | Admitting: Oncology

## 2019-02-26 ENCOUNTER — Other Ambulatory Visit: Payer: Self-pay

## 2019-02-26 ENCOUNTER — Ambulatory Visit: Payer: Medicare Other

## 2019-02-26 LAB — BPAM PLATELET PHERESIS
Blood Product Expiration Date: 202009162359
Blood Product Expiration Date: 202009172359
Blood Product Expiration Date: 202009172359
ISSUE DATE / TIME: 202009141321
ISSUE DATE / TIME: 202009141616
ISSUE DATE / TIME: 202009151008
Unit Type and Rh: 5100
Unit Type and Rh: 5100
Unit Type and Rh: 6200

## 2019-02-26 LAB — PREPARE PLATELET PHERESIS
Unit division: 0
Unit division: 0
Unit division: 0

## 2019-02-27 ENCOUNTER — Inpatient Hospital Stay: Payer: Medicare Other | Admitting: *Deleted

## 2019-02-27 ENCOUNTER — Other Ambulatory Visit: Payer: Self-pay | Admitting: *Deleted

## 2019-02-27 ENCOUNTER — Inpatient Hospital Stay: Payer: Medicare Other

## 2019-02-27 ENCOUNTER — Other Ambulatory Visit: Payer: Self-pay | Admitting: Internal Medicine

## 2019-02-27 ENCOUNTER — Other Ambulatory Visit: Payer: Self-pay

## 2019-02-27 ENCOUNTER — Encounter: Payer: Self-pay | Admitting: Oncology

## 2019-02-27 ENCOUNTER — Inpatient Hospital Stay (HOSPITAL_BASED_OUTPATIENT_CLINIC_OR_DEPARTMENT_OTHER): Payer: Medicare Other | Admitting: Oncology

## 2019-02-27 VITALS — BP 143/82 | HR 69 | Temp 97.1°F | Resp 18

## 2019-02-27 DIAGNOSIS — C9200 Acute myeloblastic leukemia, not having achieved remission: Secondary | ICD-10-CM

## 2019-02-27 DIAGNOSIS — Z95828 Presence of other vascular implants and grafts: Secondary | ICD-10-CM

## 2019-02-27 DIAGNOSIS — D469 Myelodysplastic syndrome, unspecified: Secondary | ICD-10-CM

## 2019-02-27 LAB — CBC WITH DIFFERENTIAL/PLATELET
Abs Immature Granulocytes: 0.01 10*3/uL (ref 0.00–0.07)
Basophils Absolute: 0 10*3/uL (ref 0.0–0.1)
Basophils Relative: 0 %
Eosinophils Absolute: 0 10*3/uL (ref 0.0–0.5)
Eosinophils Relative: 0 %
HCT: 21.9 % — ABNORMAL LOW (ref 36.0–46.0)
Hemoglobin: 7.1 g/dL — ABNORMAL LOW (ref 12.0–15.0)
Immature Granulocytes: 4 %
Lymphocytes Relative: 60 %
Lymphs Abs: 0.2 10*3/uL — ABNORMAL LOW (ref 0.7–4.0)
MCH: 27 pg (ref 26.0–34.0)
MCHC: 32.4 g/dL (ref 30.0–36.0)
MCV: 83.3 fL (ref 80.0–100.0)
Monocytes Absolute: 0.1 10*3/uL (ref 0.1–1.0)
Monocytes Relative: 25 %
Neutro Abs: 0 10*3/uL — ABNORMAL LOW (ref 1.7–7.7)
Neutrophils Relative %: 11 %
Platelets: 7 10*3/uL — CL (ref 150–400)
RBC: 2.63 MIL/uL — ABNORMAL LOW (ref 3.87–5.11)
RDW: 17.7 % — ABNORMAL HIGH (ref 11.5–15.5)
Smear Review: NORMAL
WBC: 0.3 10*3/uL — CL (ref 4.0–10.5)
nRBC: 25 % — ABNORMAL HIGH (ref 0.0–0.2)

## 2019-02-27 LAB — COMPREHENSIVE METABOLIC PANEL
ALT: 16 U/L (ref 0–44)
AST: 25 U/L (ref 15–41)
Albumin: 3.5 g/dL (ref 3.5–5.0)
Alkaline Phosphatase: 98 U/L (ref 38–126)
Anion gap: 11 (ref 5–15)
BUN: 22 mg/dL (ref 8–23)
CO2: 25 mmol/L (ref 22–32)
Calcium: 8.9 mg/dL (ref 8.9–10.3)
Chloride: 101 mmol/L (ref 98–111)
Creatinine, Ser: 0.53 mg/dL (ref 0.44–1.00)
GFR calc Af Amer: >60 mL/min (ref >60)
GFR calc non Af Amer: >60 mL/min (ref >60)
Glucose, Bld: 118 mg/dL — ABNORMAL HIGH (ref 70–99)
Potassium: 3.2 mmol/L — ABNORMAL LOW (ref 3.5–5.1)
Sodium: 137 mmol/L (ref 135–145)
Total Bilirubin: 0.7 mg/dL (ref 0.3–1.2)
Total Protein: 6 g/dL — ABNORMAL LOW (ref 6.5–8.1)

## 2019-02-27 LAB — LACTATE DEHYDROGENASE: LDH: 351 U/L — ABNORMAL HIGH (ref 98–192)

## 2019-02-27 LAB — PREPARE RBC (CROSSMATCH)

## 2019-02-27 MED ORDER — ACETAMINOPHEN 325 MG PO TABS
650.0000 mg | ORAL_TABLET | Freq: Once | ORAL | Status: AC
  Administered 2019-02-27: 10:00:00 650 mg via ORAL
  Filled 2019-02-27: qty 2

## 2019-02-27 MED ORDER — FILGRASTIM-AAFI 300 MCG/0.5ML IJ SOSY
300.0000 ug | PREFILLED_SYRINGE | Freq: Once | INTRAMUSCULAR | Status: AC
  Administered 2019-02-27: 11:00:00 300 ug via SUBCUTANEOUS
  Filled 2019-02-27: qty 0.5

## 2019-02-27 MED ORDER — DIPHENHYDRAMINE HCL 25 MG PO CAPS
25.0000 mg | ORAL_CAPSULE | Freq: Once | ORAL | Status: AC
  Administered 2019-02-27: 10:00:00 25 mg via ORAL
  Filled 2019-02-27: qty 1

## 2019-02-27 MED ORDER — HEPARIN SOD (PORK) LOCK FLUSH 100 UNIT/ML IV SOLN
250.0000 [IU] | INTRAVENOUS | Status: AC | PRN
  Administered 2019-02-27: 250 [IU]
  Filled 2019-02-27: qty 5

## 2019-02-27 MED ORDER — SODIUM CHLORIDE 0.9% FLUSH
10.0000 mL | Freq: Once | INTRAVENOUS | Status: AC
  Administered 2019-02-27: 10 mL via INTRAVENOUS
  Filled 2019-02-27: qty 10

## 2019-02-27 MED ORDER — SODIUM CHLORIDE 0.9% IV SOLUTION
250.0000 mL | Freq: Once | INTRAVENOUS | Status: AC
  Administered 2019-02-27: 10:00:00 250 mL via INTRAVENOUS
  Filled 2019-02-27: qty 250

## 2019-02-27 NOTE — Progress Notes (Signed)
Colette pls schedule.

## 2019-02-27 NOTE — Progress Notes (Signed)
Discussed with Sonia Baller, proceed with platelets/ PRBC today; nivesytm.  Labs/nivestim tomorrow  # follow up- 9/21- MD;labs- cbc/cmp/ldh; possible platelets/PRBC; possible nivestim.  GB

## 2019-02-27 NOTE — Progress Notes (Signed)
Symptom Management Consult note [facility]  [phone] [phone]  Patient Care Team: Rusty Aus, MD as PCP - General (Internal Medicine)   Name of the patient: Kathryn Hahn  [id number]  83-04-1934   Date of visit: 09/17/203-0   Diagnosis- 1. MDS/MPN (myelodysplastic/myeloproliferative neoplasms) (HCC) - filgrastim-aafi (NIVESTYM) injection 300 mcg  Other orders - Darbepoetin Alfa (ARANESP) injection 300 mcg - alteplase (CATHFLO ACTIVASE) injection 2 mg - heparin lock flush 100 unit/mL - sodium chloride flush (NS) 0.9 % injection 10 mL - sodium chloride flush (NS) 0.9 % injection 3 mL - heparin lock flush 100 unit/mL - 0.9 %  sodium chloride infusion - ferumoxytol (FERAHEME) 510 mg in sodium chloride 0.9 % 100 mL IVPB - filgrastim-aafi (NIVESTYM) injection 300 mcg    Chief complaint/ Reason for visit-follow-up/lab work and possible platelets/PRBCs  Heme/Onc history:  Oncology History Overview Note  # SEP 2017- MYELOPROLIFERATIVE NEOPLASM- [WBC- 45; normal Hb/platelets] hypercellular bone marrow 90-95% proliferation of myeloid cells in various stages of maturation; relative erythroid hypoplasia and proliferation of atypical megakaryocytes; no increase in blasts; peripheral blood Bcr-Abl-NEG; Cytogenetics- WNL. NEG- Jak-2/MPL/CALR [place] limited- mild splenomegaly [~10cm; 463cm3]; OCT 2017- second opinion at [place]. Surveillance.   # With progressive leukocytosis we evaluated her a month ago and sent BM for exam. She has a progressive leukocytosis so hydrea '500mg'$  daily was started on 10/20/2016 and increased to 2gm daily then back down to one daily, then 5 days per week over time due to counts drop. Then we held her hydrea since 04/09/2018 due to continued declining Hb and Plt.s  BMB 06/24/18 showed markedly hypercellular BM 95% with myeloid hyperplasia and atypical megakaryocytic hyperplasia. No significant increase in  blasts. 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 BCR / ABL negative, FISH normal. Flow Showed 2%CD34-positive myeloid blasts. Myeloid precursors with low side scatter. Pathogenic variants were detected in the ASXL1, CCND2, CUX1, and U2AF1 genes.  # March 2020- wbc- 14/ Hb 9.5/ platelets- 54.   ---------------------------------------------------   # June 8th 2020- Acute myeloid leukemia [peripheral blood flow cytometry;NGS- pending]- June 15th Vidaza [SQ 1-5 + Venatoclax- #1in pt]; tumor lysis prophylaxis  # Day #28 bone marrow biopsy-  7/14- BMx- NO BLASTS; positive for dysplastic changes. Currently off Venatoclax [since 7/17] sec to cytopenias.  #817 cycle #2 Vidaza+ venetoclax 100 mg; stop venetoclax on 8/24 [severe pancytopenia]  # 02/18/2019-start gilteritinib 80 mg a day  # 2002 [[place]] BREAST CA s/p Lumpect RT; [? Stage I] no chemo s/p AI  # F-ONE HEM: FLT3 ALTERATION NOTED  DIAGNOSIS: ACUTE MYELOID LEUKEMIA GOALS: pallaitive  CURRENT/MOST RECENT THERAPY : Vidaza+ venotoclax.      MDS (myelodysplastic syndrome) ([place]) (Resolved)  08/28/2018 Initial Diagnosis   MDS (myelodysplastic syndrome) (HCC)   AML (acute myeloid leukemia) with failed remission ([place])  11/25/2018 Initial Diagnosis   AML (acute myeloid leukemia) with failed remission ([place])   11/25/2018 - 12/22/2018 Chemotherapy   The patient had azaCITIDine (VIDAZA) chemo injection 105 mg, 75 mg/m2 = 105 mg, Subcutaneous,  Once, 1 of 4 cycles Administration: 105 mg (11/25/2018), 105 mg (11/26/2018), 105 mg (11/27/2018), 105 mg (11/28/2018), 105 mg (11/29/2018)  for chemotherapy treatment.    01/27/2019 -  Chemotherapy   The patient had palonosetron (ALOXI) injection 0.25 mg, 0.25 mg, Intravenous,  Once, 1 of 4 cycles Administration: 0.25 mg (01/27/2019), 0.25 mg (01/29/2019), 0.25 mg (01/31/2019) azaCITIDine (VIDAZA) 100 mg in sodium chloride 0.9 % 50 mL  chemo infusion, 75  mg/m2 = 100 mg, Intravenous, Once, 1 of 4 cycles Administration: 100 mg (01/27/2019), 100 mg (01/28/2019), 100 mg (01/29/2019), 100 mg (01/30/2019), 100 mg (01/31/2019)  for chemotherapy treatment.       Interval history-patient presents to [facility] for follow-up on lab work and the need for additional PRBCs and or platelets.  Today, patient feels well.  Energy level is "fine".  She is working with physical therapy daily and feels that she is getting around well.  She believes she will be able to move back and to her independent living with her husband next week.  She also has a follow-up appointment with her orthopedic physician for her right arm fracture next week.  She denies any fevers, bleeding, chest pain, nausea, vomiting, constipation or diarrhea.  She denies any urinary complaints.  She has continued bruising to left and right leg and scattered on bilateral arms and right chest.  ECOG FS:3 - Symptomatic, >50% confined to bed  Review of systems- Review of Systems  Constitutional: Positive for malaise/fatigue.  Respiratory: Positive for shortness of breath (With exertion.).   Neurological: Positive for weakness.  Endo/Heme/Allergies: Bruises/bleeds easily.     Current treatment-status post cycle 2 Vidaza plus venetoclax; recently discontinued due to cytopenias.  Started on gilteritinib which was also recently stopped due to persistent thrombocytopenia.   Allergies  Allergen Reactions   Terbinafine Rash and Swelling     Past Medical History:  Diagnosis Date   Anemia    Arthritis    Breast cancer ([place]) 2003   RT LUMPECTOMY   Edema leg    History of breast cancer 2003   post lumpectomy   History of kidney stones    Hyperlipemia, mixed    Hypertension, essential    Leukemia ([place])    Leukocytosis    MDS (myelodysplastic syndrome) ([place])    Personal history of radiation therapy 2003   BREAST CA   Renal stones    Vitamin D deficiency      Past Surgical History:    Procedure Laterality Date   BREAST BIOPSY Right 2003   Postive for cancer   BREAST LUMPECTOMY Right 2003   BREAST CA   KIDNEY STONE SURGERY Right     Social History   Socioeconomic History   Marital status: Married    Spouse name: Not on file   Number of children: Not on file   Years of education: Not on file   Highest education level: Not on file  Occupational History   Not on file  Social Needs   Financial resource strain: Not on file   Food insecurity    Worry: Not on file    Inability: Not on file   Transportation needs    Medical: Not on file    Non-medical: Not on file  Tobacco Use   Smoking status: Never Smoker   Smokeless tobacco: Never Used  Substance and Sexual Activity   Alcohol use: Yes    Alcohol/week: 3.0 standard drinks    Types: 3 Glasses of wine per week   Drug use: No   Sexual activity: Not on file  Lifestyle   Physical activity    Days per week: Not on file    Minutes per session: Not on file   Stress: Not on file  Relationships   Social connections    Talks on phone: Not on file    Gets together: Not on file    Attends religious service: Not on file  Active member of club or organization: Not on file    Attends meetings of clubs or organizations: Not on file    Relationship status: Not on file   Intimate partner violence    Fear of current or ex partner: Not on file    Emotionally abused: Not on file    Physically abused: Not on file    Forced sexual activity: Not on file  Other Topics Concern   Not on file  Social History Narrative    No smoking/alcohol; lives in [place] with family.  She lives in assisted living with her husband.    Family History  Problem Relation Age of Onset   Stroke Mother    Hypertension Father    Stroke Father    Breast cancer Neg Hx      Current Outpatient Medications:    acetaminophen (TYLENOL) 325 MG tablet, Take by mouth., Disp: , Rfl:    acyclovir (ZOVIRAX) 400 MG  tablet, One pill a day [to prevent shingles], Disp: 30 tablet, Rfl: 3   atenolol (TENORMIN) 50 MG tablet, Take 1 tablet by mouth 2 (two) times daily., Disp: , Rfl:    calcium carbonate (OSCAL) 1500 (600 Ca) MG TABS tablet, Take 600 mg of elemental calcium by mouth daily with breakfast., Disp: , Rfl:    Cholecalciferol (VITAMIN D) 2000 units tablet, Take 1 tablet by mouth daily., Disp: , Rfl:    Gilteritinib Fumarate (XOSPATA) 40 MG TABS, Take 80 mg by mouth daily., Disp: 60 tablet, Rfl: 2   heparin lock flush 100 UNIT/ML SOLN injection, Inject into the vein., Disp: , Rfl:    Infant Care Products (DERMACLOUD) CREA, Apply topically., Disp: , Rfl:    levETIRAcetam (KEPPRA) 250 MG tablet, Take 1 tablet (250 mg total) by mouth every morning., Disp: 2 tablet, Rfl: 0   levofloxacin (LEVAQUIN) 250 MG tablet, Take 1 tablet (250 mg total) by mouth daily., Disp: 30 tablet, Rfl: 3   losartan-hydrochlorothiazide (HYZAAR) 50-12.5 MG tablet, Take 1 tablet by mouth daily., Disp: , Rfl:    ondansetron (ZOFRAN) 8 MG tablet, Take by mouth every 8 (eight) hours as needed for nausea or vomiting., Disp: , Rfl:    polyethylene glycol (MIRALAX / GLYCOLAX) 17 g packet, Take 17 g by mouth daily., Disp: , Rfl:    potassium chloride SA (K-DUR) 20 MEQ tablet, Take 1 tablet (20 mEq total) by mouth 2 (two) times daily., Disp: 10 tablet, Rfl: 0   senna (SENOKOT) 8.6 MG tablet, Take 2 tablets by mouth 2 (two) times daily., Disp: , Rfl:    magic mouthwash w/lidocaine SOLN, Take 5 mLs by mouth 4 (four) times daily as needed for mouth pain. (Patient not taking: Reported on 02/07/2019), Disp: 480 mL, Rfl: 3   oxycodone (OXY-IR) 5 MG capsule, Take 1 capsule (5 mg total) by mouth every 4 (four) hours as needed. (Patient not taking: Reported on 02/07/2019), Disp: 15 capsule, Rfl: 0   posaconazole (NOXAFIL) 100 MG TBEC delayed-release tablet, Take 2 tablets (200 mg total) by mouth daily. DO NOT START UNTIL-OK with MD. (Patient  not taking: Reported on 02/07/2019), Disp: 60 tablet, Rfl: 3  Current Facility-Administered Medications:    filgrastim-aafi (NIVESTYM) injection 300 mcg, 300 mcg, Subcutaneous, Once, Brahmanday, Govinda R, MD  Facility-Administered Medications Ordered in Other Visits:    0.9 %  sodium chloride infusion, , Intravenous, Continuous, Dian Laprade, Wandra Feinstein, NP, Stopped at 02/05/19 1630  Physical exam:  Vitals:   02/27/19 0851  BP: (!) 143/82  Pulse: 69  Resp: 18  Temp: (!) 97.1 F (36.2 C)  TempSrc: Tympanic   Physical Exam Constitutional:      Comments: Sitting in wheelchair  Cardiovascular:     Comments: TED hose in place Musculoskeletal:        General: Signs of injury (Right arm in sling) present.  Skin:    Coloration: Skin is pale.     Findings: Bruising present.  Neurological:     Mental Status: She is alert.      CMP Latest Ref Rng & Units 02/27/2019  Glucose 70 - 99 mg/dL 118(H)  BUN 8 - 23 mg/dL 22  Creatinine 0.44 - 1.00 mg/dL 0.53  Sodium 135 - 145 mmol/L 137  Potassium 3.5 - 5.1 mmol/L 3.2(L)  Chloride 98 - 111 mmol/L 101  CO2 22 - 32 mmol/L 25  Calcium 8.9 - 10.3 mg/dL 8.9  Total Protein 6.5 - 8.1 g/dL 6.0(L)  Total Bilirubin 0.3 - 1.2 mg/dL 0.7  Alkaline Phos 38 - 126 U/L 98  AST 15 - 41 U/L 25  ALT 0 - 44 U/L 16   CBC Latest Ref Rng & Units 02/27/2019  WBC 4.0 - 10.5 K/uL 0.3(LL)  Hemoglobin 12.0 - 15.0 g/dL 7.1(L)  Hematocrit 36.0 - 46.0 % 21.9(L)  Platelets 150 - 400 K/uL 7(LL)        No results found.   Assessment and plan- Patient is a 85 y.o. female who [facility] for follow-up of thrombocytopenia and bruising with petechiae and the need for possible blood and/or platelets.  1.  AML: She was recently diagnosed approximately 3 months ago after PCP noticed progressive leukocytosis who subsequently was sent for bone marrow.  She was started on Hydrea 500 mg daily in May 2018 until blood count started to drop.  Bone marrow did not reveal any  significant increase in blasts.  Diagnosis favored to be MDS or myeloproliferative neoplasm.  Counts continue to decline in June 2020.  Unfortunately, suffered a fall and injured right shoulder.  She was diagnosed with acute myeloid leukemia by peripheral flow cytometry and started on 5 days of venetoclax.  Bone marrow on 12/24/2018 revealed no blasts but was positive for dysplastic changes.  Her last treatment was several weeks ago where she received cycle 2 Vidaza and was instructed to stop due to cytopenias.  She has been receiving PRBC transfusions/platelet transfusions twice a week.  Venetoclax and Vidaza were discontinued due to sustained cytopenias.  She was switched to gilteritinib 80 mg a day.  2.  Thrombocytopenia/anemia/neutropenia: See plan below.  She will receive blood, platelets and Granix today in clinic.  Plan: 1 unit PRBCs today. 1 unit platelet today. 300 mcg Granix today. Continue to hold gilteritinib.   Disposition: Return to clinic tomorrow for repeat lab work and Granix with possible platelets.  Return to clinic on Monday for lab work +/-blood/platelets and MD assessment.   Visit Diagnosis 1. MDS/MPN (myelodysplastic/myeloproliferative neoplasms) ([place])     Patient expressed understanding and was in agreement with this plan. She also understands that She can call clinic at any time with any questions, concerns, or complaints.   Greater than 50% was spent in counseling and coordination of care with this patient including but not limited to discussion of the relevant topics above (See A&P) including, but not limited to diagnosis and management of acute and chronic medical conditions.   Thank you for allowing me to participate in the care of this very pleasant patient.  Jacquelin Hawking, NP [place] [facility] - [phone] Pager- [phone] 02/27/2019 9:55 AM

## 2019-02-27 NOTE — Addendum Note (Signed)
Addended by: Sabino Gasser on: 02/27/2019 09:41 AM   Modules accepted: Orders

## 2019-02-27 NOTE — Addendum Note (Signed)
Addended by: Renita Papa R on: 02/27/2019 09:30 AM   Modules accepted: Orders

## 2019-02-28 ENCOUNTER — Other Ambulatory Visit: Payer: Self-pay | Admitting: *Deleted

## 2019-02-28 ENCOUNTER — Inpatient Hospital Stay: Payer: Medicare Other

## 2019-02-28 ENCOUNTER — Other Ambulatory Visit: Payer: Self-pay

## 2019-02-28 ENCOUNTER — Inpatient Hospital Stay: Payer: Medicare Other | Admitting: *Deleted

## 2019-02-28 VITALS — BP 144/68 | HR 76 | Temp 97.8°F | Resp 20

## 2019-02-28 DIAGNOSIS — C9200 Acute myeloblastic leukemia, not having achieved remission: Secondary | ICD-10-CM

## 2019-02-28 DIAGNOSIS — Z452 Encounter for adjustment and management of vascular access device: Secondary | ICD-10-CM

## 2019-02-28 DIAGNOSIS — D469 Myelodysplastic syndrome, unspecified: Secondary | ICD-10-CM

## 2019-02-28 LAB — TYPE AND SCREEN
ABO/RH(D): O POS
Antibody Screen: NEGATIVE
Unit division: 0

## 2019-02-28 LAB — BPAM PLATELET PHERESIS
Blood Product Expiration Date: 202009192359
ISSUE DATE / TIME: 202009171221
Unit Type and Rh: 5100

## 2019-02-28 LAB — CBC WITH DIFFERENTIAL/PLATELET
Abs Immature Granulocytes: 0.06 10*3/uL (ref 0.00–0.07)
Basophils Absolute: 0 10*3/uL (ref 0.0–0.1)
Basophils Relative: 0 %
Eosinophils Absolute: 0 10*3/uL (ref 0.0–0.5)
Eosinophils Relative: 0 %
HCT: 26.8 % — ABNORMAL LOW (ref 36.0–46.0)
Hemoglobin: 8.8 g/dL — ABNORMAL LOW (ref 12.0–15.0)
Immature Granulocytes: 19 %
Lymphocytes Relative: 47 %
Lymphs Abs: 0.2 10*3/uL — ABNORMAL LOW (ref 0.7–4.0)
MCH: 27 pg (ref 26.0–34.0)
MCHC: 32.8 g/dL (ref 30.0–36.0)
MCV: 82.2 fL (ref 80.0–100.0)
Monocytes Absolute: 0.1 10*3/uL (ref 0.1–1.0)
Monocytes Relative: 22 %
Neutro Abs: 0 10*3/uL — ABNORMAL LOW (ref 1.7–7.7)
Neutrophils Relative %: 12 %
Platelets: 13 10*3/uL — CL (ref 150–400)
RBC: 3.26 MIL/uL — ABNORMAL LOW (ref 3.87–5.11)
RDW: 17.8 % — ABNORMAL HIGH (ref 11.5–15.5)
WBC: 0.3 10*3/uL — CL (ref 4.0–10.5)
nRBC: 21.9 % — ABNORMAL HIGH (ref 0.0–0.2)

## 2019-02-28 LAB — PREPARE PLATELET PHERESIS: Unit division: 0

## 2019-02-28 LAB — BPAM RBC
Blood Product Expiration Date: 202010122359
ISSUE DATE / TIME: 202009171026
Unit Type and Rh: 5100

## 2019-02-28 LAB — PLATELET COUNT: Platelets: 34 10*3/uL — ABNORMAL LOW (ref 150–400)

## 2019-02-28 MED ORDER — HEPARIN SOD (PORK) LOCK FLUSH 100 UNIT/ML IV SOLN
250.0000 [IU] | Freq: Once | INTRAVENOUS | Status: AC | PRN
  Administered 2019-02-28: 16:00:00 250 [IU]

## 2019-02-28 MED ORDER — DIPHENHYDRAMINE HCL 25 MG PO CAPS
25.0000 mg | ORAL_CAPSULE | Freq: Once | ORAL | Status: AC
  Administered 2019-02-28: 09:00:00 25 mg via ORAL
  Filled 2019-02-28: qty 1

## 2019-02-28 MED ORDER — FILGRASTIM-AAFI 300 MCG/0.5ML IJ SOSY
300.0000 ug | PREFILLED_SYRINGE | Freq: Once | INTRAMUSCULAR | Status: AC
  Administered 2019-02-28: 300 ug via SUBCUTANEOUS
  Filled 2019-02-28: qty 0.5

## 2019-02-28 MED ORDER — ACETAMINOPHEN 325 MG PO TABS
650.0000 mg | ORAL_TABLET | Freq: Once | ORAL | Status: AC
  Administered 2019-02-28: 650 mg via ORAL
  Filled 2019-02-28: qty 2

## 2019-02-28 MED ORDER — SODIUM CHLORIDE 0.9% IV SOLUTION
250.0000 mL | Freq: Once | INTRAVENOUS | Status: AC
  Administered 2019-02-28: 09:00:00 250 mL via INTRAVENOUS
  Filled 2019-02-28: qty 250

## 2019-02-28 MED ORDER — SODIUM CHLORIDE 0.9% FLUSH
10.0000 mL | Freq: Once | INTRAVENOUS | Status: AC
  Administered 2019-02-28: 08:00:00 10 mL via INTRAVENOUS
  Filled 2019-02-28: qty 10

## 2019-03-01 LAB — PREPARE PLATELET PHERESIS: Unit division: 0

## 2019-03-01 LAB — BPAM PLATELET PHERESIS
Blood Product Expiration Date: 202009202359
ISSUE DATE / TIME: 202009181240
Unit Type and Rh: 5100

## 2019-03-02 ENCOUNTER — Other Ambulatory Visit: Payer: Self-pay

## 2019-03-02 ENCOUNTER — Encounter: Payer: Self-pay | Admitting: Internal Medicine

## 2019-03-02 NOTE — Progress Notes (Signed)
Patient stated that she had been doing well with no complaints. Patient would like to be called when sheis in the room with Dr. Rogue Bussing.

## 2019-03-03 ENCOUNTER — Encounter: Payer: Self-pay | Admitting: Internal Medicine

## 2019-03-03 ENCOUNTER — Inpatient Hospital Stay: Payer: Medicare Other

## 2019-03-03 ENCOUNTER — Other Ambulatory Visit: Payer: Self-pay

## 2019-03-03 ENCOUNTER — Inpatient Hospital Stay: Payer: Medicare Other | Admitting: *Deleted

## 2019-03-03 ENCOUNTER — Other Ambulatory Visit: Payer: Self-pay | Admitting: *Deleted

## 2019-03-03 ENCOUNTER — Inpatient Hospital Stay (HOSPITAL_BASED_OUTPATIENT_CLINIC_OR_DEPARTMENT_OTHER): Payer: Medicare Other | Admitting: Internal Medicine

## 2019-03-03 VITALS — BP 137/78 | HR 69 | Temp 97.0°F | Resp 18

## 2019-03-03 DIAGNOSIS — D469 Myelodysplastic syndrome, unspecified: Secondary | ICD-10-CM

## 2019-03-03 DIAGNOSIS — C9200 Acute myeloblastic leukemia, not having achieved remission: Secondary | ICD-10-CM

## 2019-03-03 DIAGNOSIS — Z452 Encounter for adjustment and management of vascular access device: Secondary | ICD-10-CM

## 2019-03-03 LAB — CBC WITH DIFFERENTIAL/PLATELET
Abs Immature Granulocytes: 0.05 10*3/uL (ref 0.00–0.07)
Basophils Absolute: 0 10*3/uL (ref 0.0–0.1)
Basophils Relative: 0 %
Eosinophils Absolute: 0 10*3/uL (ref 0.0–0.5)
Eosinophils Relative: 0 %
HCT: 26.3 % — ABNORMAL LOW (ref 36.0–46.0)
Hemoglobin: 8.6 g/dL — ABNORMAL LOW (ref 12.0–15.0)
Immature Granulocytes: 13 %
Lymphocytes Relative: 44 %
Lymphs Abs: 0.2 10*3/uL — ABNORMAL LOW (ref 0.7–4.0)
MCH: 26.7 pg (ref 26.0–34.0)
MCHC: 32.7 g/dL (ref 30.0–36.0)
MCV: 81.7 fL (ref 80.0–100.0)
Monocytes Absolute: 0.1 10*3/uL (ref 0.1–1.0)
Monocytes Relative: 28 %
Neutro Abs: 0.1 10*3/uL — ABNORMAL LOW (ref 1.7–7.7)
Neutrophils Relative %: 15 %
Platelets: 9 10*3/uL — CL (ref 150–400)
RBC: 3.22 MIL/uL — ABNORMAL LOW (ref 3.87–5.11)
RDW: 17.4 % — ABNORMAL HIGH (ref 11.5–15.5)
Smear Review: DECREASED
WBC: 0.4 10*3/uL — CL (ref 4.0–10.5)
nRBC: 7.7 % — ABNORMAL HIGH (ref 0.0–0.2)

## 2019-03-03 LAB — COMPREHENSIVE METABOLIC PANEL
ALT: 16 U/L (ref 0–44)
AST: 19 U/L (ref 15–41)
Albumin: 3.6 g/dL (ref 3.5–5.0)
Alkaline Phosphatase: 99 U/L (ref 38–126)
Anion gap: 10 (ref 5–15)
BUN: 20 mg/dL (ref 8–23)
CO2: 26 mmol/L (ref 22–32)
Calcium: 8.9 mg/dL (ref 8.9–10.3)
Chloride: 99 mmol/L (ref 98–111)
Creatinine, Ser: 0.52 mg/dL (ref 0.44–1.00)
GFR calc Af Amer: >60 mL/min (ref >60)
GFR calc non Af Amer: >60 mL/min (ref >60)
Glucose, Bld: 126 mg/dL — ABNORMAL HIGH (ref 70–99)
Potassium: 3.1 mmol/L — ABNORMAL LOW (ref 3.5–5.1)
Sodium: 135 mmol/L (ref 135–145)
Total Bilirubin: 1 mg/dL (ref 0.3–1.2)
Total Protein: 6.2 g/dL — ABNORMAL LOW (ref 6.5–8.1)

## 2019-03-03 LAB — SAMPLE TO BLOOD BANK

## 2019-03-03 LAB — LACTATE DEHYDROGENASE: LDH: 324 U/L — ABNORMAL HIGH (ref 98–192)

## 2019-03-03 MED ORDER — DIPHENHYDRAMINE HCL 25 MG PO CAPS
25.0000 mg | ORAL_CAPSULE | Freq: Once | ORAL | Status: AC
  Administered 2019-03-03: 12:00:00 25 mg via ORAL
  Filled 2019-03-03: qty 1

## 2019-03-03 MED ORDER — HEPARIN SOD (PORK) LOCK FLUSH 100 UNIT/ML IV SOLN
250.0000 [IU] | Freq: Once | INTRAVENOUS | Status: AC
  Administered 2019-03-03: 250 [IU] via INTRAVENOUS
  Filled 2019-03-03: qty 5

## 2019-03-03 MED ORDER — SODIUM CHLORIDE 0.9% FLUSH
10.0000 mL | Freq: Once | INTRAVENOUS | Status: AC
  Administered 2019-03-03: 10 mL via INTRAVENOUS
  Filled 2019-03-03: qty 10

## 2019-03-03 MED ORDER — ACETAMINOPHEN 325 MG PO TABS
650.0000 mg | ORAL_TABLET | Freq: Once | ORAL | Status: AC
  Administered 2019-03-03: 650 mg via ORAL
  Filled 2019-03-03: qty 2

## 2019-03-03 MED ORDER — SODIUM CHLORIDE 0.9% IV SOLUTION
250.0000 mL | Freq: Once | INTRAVENOUS | Status: AC
  Administered 2019-03-03: 11:00:00 250 mL via INTRAVENOUS
  Filled 2019-03-03: qty 250

## 2019-03-03 MED ORDER — FILGRASTIM-AAFI 300 MCG/0.5ML IJ SOSY
300.0000 ug | PREFILLED_SYRINGE | Freq: Once | INTRAMUSCULAR | Status: AC
  Administered 2019-03-03: 300 ug via SUBCUTANEOUS
  Filled 2019-03-03: qty 0.5

## 2019-03-03 NOTE — Progress Notes (Signed)
[facility] CONSULT NOTE  Patient Care Team: Rusty Aus, MD as PCP - General (Internal Medicine)  CHIEF COMPLAINTS/PURPOSE OF CONSULTATION: Acute myeloid leukemia   Oncology History Overview Note  # SEP 2017- MYELOPROLIFERATIVE NEOPLASM- [WBC- 50; normal Hb/platelets] hypercellular bone marrow 90-95% proliferation of myeloid cells in various stages of maturation; relative erythroid hypoplasia and proliferation of atypical megakaryocytes; no increase in blasts; peripheral blood Bcr-Abl-NEG; Cytogenetics- WNL. NEG- Jak-2/MPL/CALR [place] limited- mild splenomegaly [~10cm; 463cm3]; OCT 2017- second opinion at [facility]. Surveillance.   # With progressive leukocytosis we evaluated her a month ago and sent BM for exam. She has a progressive leukocytosis so hydrea '500mg'$  daily was started on 10/20/2016 and increased to 2gm daily then back down to one daily, then 5 days per week over time due to counts drop. Then we held her hydrea since 04/09/2018 due to continued declining Hb and Plt.s  BMB 06/24/18 showed markedly hypercellular BM 95% with myeloid hyperplasia and atypical megakaryocytic hyperplasia. No significant increase in blasts. 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 BCR / ABL negative, FISH normal. Flow Showed 2%CD34-positive myeloid blasts. Myeloid precursors with low side scatter. Pathogenic variants were detected in the ASXL1, CCND2, CUX1, and U2AF1 genes.  # March 2020- wbc- 14/ Hb 9.5/ platelets- 54.   ---------------------------------------------------   # June 8th 2020- Acute myeloid leukemia [peripheral blood flow cytometry;NGS- pending]- June 15th Vidaza [SQ 1-5 + Venatoclax- #1in pt]; tumor lysis prophylaxis  # Day #28 bone marrow biopsy-  7/14- BMx- NO BLASTS; positive for dysplastic changes. Currently off Venatoclax [since 7/17] sec to cytopenias.  #817 cycle #2 Vidaza+ venetoclax 100 mg; stop venetoclax on  8/24 [severe pancytopenia]  # 02/18/2019-start gilteritinib 80 mg a day; HELD on 9/14 [sec to severe pan]  # AUG 2020- AST elevation- 248-?sec to Posonoazole; RESOLVED; continue at lower dose 100 mg/day.   # 2002 [[place]] BREAST CA s/p Lumpect RT; [? Stage I] no chemo s/p AI  # F-ONE HEM: FLT3 ALTERATION NOTED  DIAGNOSIS: ACUTE MYELOID LEUKEMIA GOALS: pallaitive  CURRENT/MOST RECENT THERAPY : Vidaza+ venotoclax.      MDS (myelodysplastic syndrome) ([place]) (Resolved)  08/28/2018 Initial Diagnosis   MDS (myelodysplastic syndrome) ([place])   Acute myeloid leukemia not having achieved remission ([place])  11/25/2018 Initial Diagnosis   AML (acute myeloid leukemia) with failed remission ([place])   11/25/2018 - 12/22/2018 Chemotherapy   The patient had azaCITIDine (VIDAZA) chemo injection 105 mg, 75 mg/m2 = 105 mg, Subcutaneous,  Once, 1 of 4 cycles Administration: 105 mg (11/25/2018), 105 mg (11/26/2018), 105 mg (11/27/2018), 105 mg (11/28/2018), 105 mg (11/29/2018)  for chemotherapy treatment.    01/27/2019 -  Chemotherapy   The patient had palonosetron (ALOXI) injection 0.25 mg, 0.25 mg, Intravenous,  Once, 1 of 4 cycles Administration: 0.25 mg (01/27/2019), 0.25 mg (01/29/2019), 0.25 mg (01/31/2019) azaCITIDine (VIDAZA) 100 mg in sodium chloride 0.9 % 50 mL chemo infusion, 75 mg/m2 = 100 mg, Intravenous, Once, 1 of 4 cycles Administration: 100 mg (01/27/2019), 100 mg (01/28/2019), 100 mg (01/29/2019), 100 mg (01/30/2019), 100 mg (01/31/2019)  for chemotherapy treatment.      HISTORY OF PRESENTING ILLNESS:  Kathryn Hahn 85 y.o.  female with acute myeloid leukemia with monocytic differentiation currently status post Vidaza approximately 4 weeks ago is here for follow-up.  Patient was started on gilteritinib September ninth; and stopped on September 14 because of severe pancytopenia.  Patient has been needing PRBC transfusion/platelets approximately 1-2 units a week.  She  has been needing growth factor  support approximately twice a week.  Overall she feels okay.  She is awaiting repeat evaluation with orthopedics.  Continues to complain of easy bruising.  Chronic mild swelling of the legs.  Chronic shortness of breath on exertion.  No fevers or chills or constipation.  Review of Systems  Constitutional: Positive for malaise/fatigue. Negative for chills, diaphoresis, fever and weight loss.  HENT: Negative for nosebleeds and sore throat.   Eyes: Negative for double vision.  Respiratory: Positive for shortness of breath. Negative for cough, hemoptysis, sputum production and wheezing.   Cardiovascular: Positive for leg swelling. Negative for chest pain, palpitations and orthopnea.  Gastrointestinal: Negative for abdominal pain, blood in stool, constipation, diarrhea, heartburn, melena, nausea and vomiting.  Genitourinary: Negative for dysuria, frequency and urgency.  Musculoskeletal: Negative for back pain and joint pain.  Skin: Negative.  Negative for itching and rash.  Neurological: Negative for dizziness, tingling, focal weakness, weakness and headaches.  Endo/Heme/Allergies: Bruises/bleeds easily.  Psychiatric/Behavioral: Negative for depression. The patient is not nervous/anxious and does not have insomnia.       MEDICAL HISTORY:  Past Medical History:  Diagnosis Date  . Anemia   . Arthritis   . Breast cancer ([place]) 2003   RT LUMPECTOMY  . Edema leg   . History of breast cancer 2003   post lumpectomy  . History of kidney stones   . Hyperlipemia, mixed   . Hypertension, essential   . Leukemia ([place])   . Leukocytosis   . MDS (myelodysplastic syndrome) ([place])   . Personal history of radiation therapy 2003   BREAST CA  . Renal stones   . Vitamin D deficiency     SURGICAL HISTORY: Past Surgical History:  Procedure Laterality Date  . BREAST BIOPSY Right 2003   Postive for cancer  . BREAST LUMPECTOMY Right 2003   BREAST CA  . KIDNEY STONE SURGERY Right     SOCIAL  HISTORY: Social History   Socioeconomic History  . Marital status: Married    Spouse name: Not on file  . Number of children: Not on file  . Years of education: Not on file  . Highest education level: Not on file  Occupational History  . Not on file  Social Needs  . Financial resource strain: Not on file  . Food insecurity    Worry: Not on file    Inability: Not on file  . Transportation needs    Medical: Not on file    Non-medical: Not on file  Tobacco Use  . Smoking status: Never Smoker  . Smokeless tobacco: Never Used  Substance and Sexual Activity  . Alcohol use: Yes    Alcohol/week: 3.0 standard drinks    Types: 3 Glasses of wine per week  . Drug use: No  . Sexual activity: Not on file  Lifestyle  . Physical activity    Days per week: Not on file    Minutes per session: Not on file  . Stress: Not on file  Relationships  . Social Herbalist on phone: Not on file    Gets together: Not on file    Attends religious service: Not on file    Active member of club or organization: Not on file    Attends meetings of clubs or organizations: Not on file    Relationship status: Not on file  . Intimate partner violence    Fear of current or ex partner: Not on file  Emotionally abused: Not on file    Physically abused: Not on file    Forced sexual activity: Not on file  Other Topics Concern  . Not on file  Social History Narrative    No smoking/alcohol; lives in [place] with family.  She lives in assisted living with her husband.    FAMILY HISTORY: Family History  Problem Relation Age of Onset  . Stroke Mother   . Hypertension Father   . Stroke Father   . Breast cancer Neg Hx     ALLERGIES:  is allergic to terbinafine.  MEDICATIONS:  Current Outpatient Medications  Medication Sig Dispense Refill  . acetaminophen (TYLENOL) 325 MG tablet Take by mouth.    Marland Kitchen acyclovir (ZOVIRAX) 400 MG tablet One pill a day [to prevent shingles] 30 tablet 3  .  atenolol (TENORMIN) 50 MG tablet Take 1 tablet by mouth 2 (two) times daily.    . calcium carbonate (OSCAL) 1500 (600 Ca) MG TABS tablet Take 600 mg of elemental calcium by mouth daily with breakfast.    . Cholecalciferol (VITAMIN D) 2000 units tablet Take 1 tablet by mouth daily.    . heparin lock flush 100 UNIT/ML SOLN injection Inject into the vein.    . Infant Care Products (DERMACLOUD) CREA Apply topically.    . levETIRAcetam (KEPPRA) 250 MG tablet Take 1 tablet (250 mg total) by mouth every morning. 2 tablet 0  . levofloxacin (LEVAQUIN) 250 MG tablet Take 1 tablet (250 mg total) by mouth daily. 30 tablet 3  . losartan-hydrochlorothiazide (HYZAAR) 50-12.5 MG tablet Take 1 tablet by mouth daily.    . magic mouthwash w/lidocaine SOLN Take 5 mLs by mouth 4 (four) times daily as needed for mouth pain. 480 mL 3  . magnesium hydroxide (MILK OF MAGNESIA) 400 MG/5ML suspension Take 5 mLs by mouth every 4 (four) hours as needed.    . ondansetron (ZOFRAN) 8 MG tablet Take by mouth every 8 (eight) hours as needed for nausea or vomiting.    . polyethylene glycol (MIRALAX / GLYCOLAX) 17 g packet Take 17 g by mouth daily.    . posaconazole (NOXAFIL) 100 MG TBEC delayed-release tablet Take 2 tablets (200 mg total) by mouth daily. DO NOT START UNTIL-OK with MD. 60 tablet 3  . potassium chloride SA (K-DUR) 20 MEQ tablet Take 1 tablet (20 mEq total) by mouth 2 (two) times daily. 10 tablet 0  . senna (SENOKOT) 8.6 MG tablet Take 2 tablets by mouth 2 (two) times daily.    . Gilteritinib Fumarate (XOSPATA) 40 MG TABS Take 80 mg by mouth daily. (Patient not taking: Reported on 03/02/2019) 60 tablet 2  . oxycodone (OXY-IR) 5 MG capsule Take 1 capsule (5 mg total) by mouth every 4 (four) hours as needed. (Patient not taking: Reported on 03/02/2019) 15 capsule 0   No current facility-administered medications for this visit.    Facility-Administered Medications Ordered in Other Visits  Medication Dose Route Frequency  Provider Last Rate Last Dose  . 0.9 %  sodium chloride infusion   Intravenous Continuous Jacquelin Hawking, NP   Stopped at 02/05/19 1630  . acetaminophen (TYLENOL) tablet 650 mg  650 mg Oral Once Charlaine Dalton R, MD      . diphenhydrAMINE (BENADRYL) capsule 25 mg  25 mg Oral Once Charlaine Dalton R, MD      . heparin lock flush 100 unit/mL  250 Units Intravenous Once Cammie Sickle, MD          .  PHYSICAL EXAMINATION: ECOG PERFORMANCE STATUS: 0 - Asymptomatic  Vitals:   03/02/19 1517  BP: (!) 146/66  Pulse: 71  Temp: (!) 97.3 F (36.3 C)   Filed Weights   03/02/19 1517  Weight: 96 lb (43.5 kg)    Physical Exam  Constitutional: She is oriented to person, place, and time and well-developed, well-nourished, and in no distress.  PICC line in place.  No active bleeding.  HENT:  Head: Normocephalic and atraumatic.  Mouth/Throat: Oropharynx is clear and moist. No oropharyngeal exudate.  Eyes: Pupils are equal, round, and reactive to light.  Neck: Normal range of motion. Neck supple.  Cardiovascular: Normal rate and regular rhythm.  Pulmonary/Chest: Effort normal and breath sounds normal. No respiratory distress. She has no wheezes.  Abdominal: Soft. Bowel sounds are normal. She exhibits no distension and no mass. There is no abdominal tenderness. There is no rebound and no guarding.  Musculoskeletal: Normal range of motion.        General: No tenderness or edema.  Neurological: She is alert and oriented to person, place, and time.  Skin: Skin is warm.  Multiple chronic bruises anterior torso extremities.  Psychiatric: Affect normal.    LABORATORY DATA:  I have reviewed the data as listed Lab Results  Component Value Date   WBC 0.4 (LL) 03/03/2019   HGB 8.6 (L) 03/03/2019   HCT 26.3 (L) 03/03/2019   MCV 81.7 03/03/2019   PLT 9 (LL) 03/03/2019   Recent Labs    02/24/19 1048 02/27/19 0826 03/03/19 0922  NA 137 137 135  K 3.2* 3.2* 3.1*  CL 100 101  99  CO2 '29 25 26  '$ GLUCOSE 174* 118* 126*  BUN 25* 22 20  CREATININE 0.51 0.53 0.52  CALCIUM 8.9 8.9 8.9  GFRNONAA >60 >60 >60  GFRAA >60 >60 >60  PROT 6.2* 6.0* 6.2*  ALBUMIN 3.5 3.5 3.6  AST '21 25 19  '$ ALT '14 16 16  '$ ALKPHOS 94 98 99  BILITOT 0.7 0.7 1.0    RADIOGRAPHIC STUDIES: I have personally reviewed the radiological images as listed and agreed with the findings in the report. No results found.  ASSESSMENT & PLAN:   Acute myeloid leukemia not having achieved remission ([place]) #Acute myeloid leukemia with monocytic differentiation- S/p Vidaza cycle #2 on 8/17; plus ven 100 mg/day; VEN- HELD since 8/24; venetoclax DISCONTINUED sec to severe pancytopenia.   #Gilteritinib 80 mg a day started on September 8; stopped on September 14th-secondary to severe pancytopenia.  # Today  -white count 0.3 hemoglobin 8.6 platelets 9.  Continue to HOLD Gilteritinib 80 mg a day.   Also discussed with [facility] Stoney [facility] at [facility].  #Status post mechanical fall intracranial bleed-minor bleed; STABLE.   # Supportive care:  # Continue Levaquin/Noxafil at '100mg'$ /acyclovir # PICC dressing weekly  #Discussed with patient's husband-at length regarding patient's plan of care/labs etc. Recommend continue  HOLDING  gilteritinib  At this time. Will likely plan to start at 1 pill a day with count recovery.   # platelets today; Nivestim; no PRBC # labs- cbc/hold tube on 9/24- -possible platelet/Blood transfusion; Nivestim # follow up M - 9/28  cbc/cmp/LDH- HOLD tube/possible PRBC/ plaletets; nivestim Dr.B    Cammie Sickle, MD 03/03/2019 12:33 PM

## 2019-03-03 NOTE — Assessment & Plan Note (Addendum)
#  Acute myeloid leukemia with monocytic differentiation- S/p Vidaza cycle #2 on 8/17; plus ven 100 mg/day; VEN- HELD since 8/24; venetoclax DISCONTINUED sec to severe pancytopenia.   #Gilteritinib 80 mg a day started on September 8; stopped on September 14th-secondary to severe pancytopenia.  # Today  -white count 0.3 hemoglobin 8.6 platelets 9.  Continue to HOLD Gilteritinib 80 mg a day.   Also discussed with [facility] Stoney [facility] at [facility].  #Status post mechanical fall intracranial bleed-minor bleed; STABLE.   # Supportive care:  # Continue Levaquin/Noxafil at 100mg /acyclovir # PICC dressing weekly  #Discussed with patient's husband-at length regarding patient's plan of care/labs etc. Recommend continue  HOLDING  gilteritinib  At this time. Will likely plan to start at 1 pill a day with count recovery.   # platelets today; Nivestim; no PRBC # labs- cbc/hold tube on 9/24- -possible platelet/Blood transfusion; Nivestim # follow up M - 9/28  cbc/cmp/LDH- HOLD tube/possible PRBC/ plaletets; nivestim Dr.B

## 2019-03-04 LAB — PREPARE PLATELET PHERESIS: Unit division: 0

## 2019-03-04 LAB — BPAM PLATELET PHERESIS
Blood Product Expiration Date: 202009232359
ISSUE DATE / TIME: 202009211241
Unit Type and Rh: 5100

## 2019-03-06 ENCOUNTER — Inpatient Hospital Stay: Payer: Medicare Other | Admitting: *Deleted

## 2019-03-06 ENCOUNTER — Inpatient Hospital Stay: Payer: Medicare Other

## 2019-03-06 ENCOUNTER — Other Ambulatory Visit: Payer: Self-pay

## 2019-03-06 VITALS — BP 121/73 | HR 78 | Temp 98.3°F | Resp 18

## 2019-03-06 DIAGNOSIS — D469 Myelodysplastic syndrome, unspecified: Secondary | ICD-10-CM

## 2019-03-06 DIAGNOSIS — D649 Anemia, unspecified: Secondary | ICD-10-CM

## 2019-03-06 DIAGNOSIS — D696 Thrombocytopenia, unspecified: Secondary | ICD-10-CM

## 2019-03-06 DIAGNOSIS — C9200 Acute myeloblastic leukemia, not having achieved remission: Secondary | ICD-10-CM | POA: Diagnosis not present

## 2019-03-06 DIAGNOSIS — Z95828 Presence of other vascular implants and grafts: Secondary | ICD-10-CM

## 2019-03-06 LAB — CBC WITH DIFFERENTIAL/PLATELET
Abs Immature Granulocytes: 0.06 10*3/uL (ref 0.00–0.07)
Basophils Absolute: 0 10*3/uL (ref 0.0–0.1)
Basophils Relative: 0 %
Eosinophils Absolute: 0 10*3/uL (ref 0.0–0.5)
Eosinophils Relative: 2 %
HCT: 25.8 % — ABNORMAL LOW (ref 36.0–46.0)
Hemoglobin: 8.4 g/dL — ABNORMAL LOW (ref 12.0–15.0)
Immature Granulocytes: 10 %
Lymphocytes Relative: 43 %
Lymphs Abs: 0.3 10*3/uL — ABNORMAL LOW (ref 0.7–4.0)
MCH: 26.6 pg (ref 26.0–34.0)
MCHC: 32.6 g/dL (ref 30.0–36.0)
MCV: 81.6 fL (ref 80.0–100.0)
Monocytes Absolute: 0.1 10*3/uL (ref 0.1–1.0)
Monocytes Relative: 24 %
Neutro Abs: 0.1 10*3/uL — ABNORMAL LOW (ref 1.7–7.7)
Neutrophils Relative %: 21 %
Platelets: 8 10*3/uL — CL (ref 150–400)
RBC: 3.16 MIL/uL — ABNORMAL LOW (ref 3.87–5.11)
RDW: 17.4 % — ABNORMAL HIGH (ref 11.5–15.5)
Smear Review: DECREASED
WBC: 0.6 10*3/uL — CL (ref 4.0–10.5)
nRBC: 5.2 % — ABNORMAL HIGH (ref 0.0–0.2)

## 2019-03-06 LAB — SAMPLE TO BLOOD BANK

## 2019-03-06 MED ORDER — HEPARIN SOD (PORK) LOCK FLUSH 100 UNIT/ML IV SOLN
500.0000 [IU] | Freq: Once | INTRAVENOUS | Status: AC
  Administered 2019-03-06: 13:00:00 500 [IU] via INTRAVENOUS
  Filled 2019-03-06: qty 5

## 2019-03-06 MED ORDER — FILGRASTIM-AAFI 300 MCG/0.5ML IJ SOSY
300.0000 ug | PREFILLED_SYRINGE | Freq: Once | INTRAMUSCULAR | Status: AC
  Administered 2019-03-06: 300 ug via SUBCUTANEOUS
  Filled 2019-03-06: qty 0.5

## 2019-03-06 MED ORDER — ACETAMINOPHEN 325 MG PO TABS
650.0000 mg | ORAL_TABLET | Freq: Once | ORAL | Status: AC
  Administered 2019-03-06: 650 mg via ORAL
  Filled 2019-03-06: qty 2

## 2019-03-06 MED ORDER — SODIUM CHLORIDE 0.9% IV SOLUTION
250.0000 mL | Freq: Once | INTRAVENOUS | Status: AC
  Administered 2019-03-06: 250 mL via INTRAVENOUS
  Filled 2019-03-06: qty 250

## 2019-03-06 MED ORDER — SODIUM CHLORIDE 0.9% FLUSH
10.0000 mL | Freq: Once | INTRAVENOUS | Status: AC
  Administered 2019-03-06: 10 mL via INTRAVENOUS
  Filled 2019-03-06: qty 10

## 2019-03-06 MED ORDER — DIPHENHYDRAMINE HCL 25 MG PO CAPS
25.0000 mg | ORAL_CAPSULE | Freq: Once | ORAL | Status: AC
  Administered 2019-03-06: 12:00:00 25 mg via ORAL
  Filled 2019-03-06: qty 1

## 2019-03-07 ENCOUNTER — Other Ambulatory Visit: Payer: Self-pay

## 2019-03-07 ENCOUNTER — Inpatient Hospital Stay: Payer: Medicare Other

## 2019-03-07 ENCOUNTER — Telehealth: Payer: Self-pay | Admitting: *Deleted

## 2019-03-07 ENCOUNTER — Encounter: Payer: Self-pay | Admitting: Internal Medicine

## 2019-03-07 ENCOUNTER — Other Ambulatory Visit: Payer: Self-pay | Admitting: Internal Medicine

## 2019-03-07 DIAGNOSIS — D469 Myelodysplastic syndrome, unspecified: Secondary | ICD-10-CM

## 2019-03-07 DIAGNOSIS — C9200 Acute myeloblastic leukemia, not having achieved remission: Secondary | ICD-10-CM | POA: Diagnosis not present

## 2019-03-07 LAB — BPAM PLATELET PHERESIS
Blood Product Expiration Date: 202009252359
ISSUE DATE / TIME: 202009241209
Unit Type and Rh: 7300

## 2019-03-07 LAB — PREPARE PLATELET PHERESIS: Unit division: 0

## 2019-03-07 MED ORDER — FILGRASTIM-AAFI 300 MCG/0.5ML IJ SOSY
300.0000 ug | PREFILLED_SYRINGE | Freq: Once | INTRAMUSCULAR | Status: AC
  Administered 2019-03-07: 300 ug via SUBCUTANEOUS
  Filled 2019-03-07: qty 0.5

## 2019-03-07 NOTE — Telephone Encounter (Signed)
Mr Germer called requesting a return call to go over Estella's medications

## 2019-03-07 NOTE — Progress Notes (Signed)
Patient pre screened for office appointment, no questions or concerns today. 

## 2019-03-07 NOTE — Telephone Encounter (Signed)
Left message for Mr. Schaber

## 2019-03-10 ENCOUNTER — Inpatient Hospital Stay: Payer: Medicare Other | Admitting: *Deleted

## 2019-03-10 ENCOUNTER — Inpatient Hospital Stay (HOSPITAL_BASED_OUTPATIENT_CLINIC_OR_DEPARTMENT_OTHER): Payer: Medicare Other | Admitting: Internal Medicine

## 2019-03-10 ENCOUNTER — Other Ambulatory Visit: Payer: Self-pay

## 2019-03-10 ENCOUNTER — Inpatient Hospital Stay: Payer: Medicare Other

## 2019-03-10 ENCOUNTER — Encounter: Payer: Self-pay | Admitting: Internal Medicine

## 2019-03-10 ENCOUNTER — Other Ambulatory Visit: Payer: Self-pay | Admitting: *Deleted

## 2019-03-10 VITALS — BP 116/74 | HR 69 | Temp 97.4°F | Resp 18

## 2019-03-10 DIAGNOSIS — D469 Myelodysplastic syndrome, unspecified: Secondary | ICD-10-CM

## 2019-03-10 DIAGNOSIS — Z452 Encounter for adjustment and management of vascular access device: Secondary | ICD-10-CM

## 2019-03-10 DIAGNOSIS — C9200 Acute myeloblastic leukemia, not having achieved remission: Secondary | ICD-10-CM | POA: Diagnosis not present

## 2019-03-10 DIAGNOSIS — Z95828 Presence of other vascular implants and grafts: Secondary | ICD-10-CM

## 2019-03-10 DIAGNOSIS — D649 Anemia, unspecified: Secondary | ICD-10-CM

## 2019-03-10 LAB — CBC WITH DIFFERENTIAL/PLATELET
Abs Immature Granulocytes: 0.13 10*3/uL — ABNORMAL HIGH (ref 0.00–0.07)
Basophils Absolute: 0 10*3/uL (ref 0.0–0.1)
Basophils Relative: 0 %
Eosinophils Absolute: 0 10*3/uL (ref 0.0–0.5)
Eosinophils Relative: 0 %
HCT: 22.6 % — ABNORMAL LOW (ref 36.0–46.0)
Hemoglobin: 7.3 g/dL — ABNORMAL LOW (ref 12.0–15.0)
Immature Granulocytes: 15 %
Lymphocytes Relative: 28 %
Lymphs Abs: 0.3 10*3/uL — ABNORMAL LOW (ref 0.7–4.0)
MCH: 26.4 pg (ref 26.0–34.0)
MCHC: 32.3 g/dL (ref 30.0–36.0)
MCV: 81.9 fL (ref 80.0–100.0)
Monocytes Absolute: 0.2 10*3/uL (ref 0.1–1.0)
Monocytes Relative: 17 %
Neutro Abs: 0.4 10*3/uL — ABNORMAL LOW (ref 1.7–7.7)
Neutrophils Relative %: 40 %
Platelets: <5 10*3/uL — CL (ref 150–400)
RBC: 2.76 MIL/uL — ABNORMAL LOW (ref 3.87–5.11)
RDW: 17.7 % — ABNORMAL HIGH (ref 11.5–15.5)
Smear Review: DECREASED
WBC: 0.9 10*3/uL — CL (ref 4.0–10.5)
nRBC: 3.4 % — ABNORMAL HIGH (ref 0.0–0.2)

## 2019-03-10 LAB — COMPREHENSIVE METABOLIC PANEL
ALT: 13 U/L (ref 0–44)
AST: 19 U/L (ref 15–41)
Albumin: 3.6 g/dL (ref 3.5–5.0)
Alkaline Phosphatase: 92 U/L (ref 38–126)
Anion gap: 4 — ABNORMAL LOW (ref 5–15)
BUN: 20 mg/dL (ref 8–23)
CO2: 26 mmol/L (ref 22–32)
Calcium: 8.8 mg/dL — ABNORMAL LOW (ref 8.9–10.3)
Chloride: 103 mmol/L (ref 98–111)
Creatinine, Ser: 0.51 mg/dL (ref 0.44–1.00)
GFR calc Af Amer: >60 mL/min (ref >60)
GFR calc non Af Amer: >60 mL/min (ref >60)
Glucose, Bld: 158 mg/dL — ABNORMAL HIGH (ref 70–99)
Potassium: 3.8 mmol/L (ref 3.5–5.1)
Sodium: 133 mmol/L — ABNORMAL LOW (ref 135–145)
Total Bilirubin: 0.9 mg/dL (ref 0.3–1.2)
Total Protein: 6.1 g/dL — ABNORMAL LOW (ref 6.5–8.1)

## 2019-03-10 LAB — LACTATE DEHYDROGENASE: LDH: 299 U/L — ABNORMAL HIGH (ref 98–192)

## 2019-03-10 LAB — PREPARE RBC (CROSSMATCH)

## 2019-03-10 LAB — SAMPLE TO BLOOD BANK

## 2019-03-10 MED ORDER — SODIUM CHLORIDE 0.9 % IV SOLN
INTRAVENOUS | Status: DC
  Administered 2019-03-10: 14:00:00 via INTRAVENOUS
  Filled 2019-03-10: qty 250

## 2019-03-10 MED ORDER — SODIUM CHLORIDE 0.9% IV SOLUTION
250.0000 mL | Freq: Once | INTRAVENOUS | Status: AC
  Administered 2019-03-10: 250 mL via INTRAVENOUS
  Filled 2019-03-10: qty 250

## 2019-03-10 MED ORDER — ACETAMINOPHEN 325 MG PO TABS
650.0000 mg | ORAL_TABLET | Freq: Once | ORAL | Status: AC
  Administered 2019-03-10: 11:00:00 650 mg via ORAL
  Filled 2019-03-10: qty 2

## 2019-03-10 MED ORDER — FILGRASTIM-AAFI 300 MCG/0.5ML IJ SOSY
300.0000 ug | PREFILLED_SYRINGE | Freq: Once | INTRAMUSCULAR | Status: AC
  Administered 2019-03-10: 12:00:00 300 ug via SUBCUTANEOUS
  Filled 2019-03-10: qty 0.5

## 2019-03-10 MED ORDER — SODIUM CHLORIDE 0.9% FLUSH
10.0000 mL | Freq: Once | INTRAVENOUS | Status: AC
  Administered 2019-03-10: 10 mL via INTRAVENOUS
  Filled 2019-03-10: qty 10

## 2019-03-10 MED ORDER — HEPARIN SOD (PORK) LOCK FLUSH 100 UNIT/ML IV SOLN
500.0000 [IU] | Freq: Once | INTRAVENOUS | Status: AC
  Administered 2019-03-10: 500 [IU] via INTRAVENOUS

## 2019-03-10 MED ORDER — DIPHENHYDRAMINE HCL 25 MG PO CAPS
25.0000 mg | ORAL_CAPSULE | Freq: Once | ORAL | Status: AC
  Administered 2019-03-10: 11:00:00 25 mg via ORAL
  Filled 2019-03-10: qty 1

## 2019-03-10 NOTE — Progress Notes (Signed)
[facility] CONSULT NOTE  Patient Care Team: Rusty Aus, MD as PCP - General (Internal Medicine)  CHIEF COMPLAINTS/PURPOSE OF CONSULTATION: Acute myeloid leukemia   Oncology History Overview Note  # SEP 2017- MYELOPROLIFERATIVE NEOPLASM- [WBC- 88; normal Hb/platelets] hypercellular bone marrow 90-95% proliferation of myeloid cells in various stages of maturation; relative erythroid hypoplasia and proliferation of atypical megakaryocytes; no increase in blasts; peripheral blood Bcr-Abl-NEG; Cytogenetics- WNL. NEG- Jak-2/MPL/CALR [place] limited- mild splenomegaly [~10cm; 463cm3]; OCT 2017- second opinion at [facility]. Surveillance.   # With progressive leukocytosis we evaluated her a month ago and sent BM for exam. She has a progressive leukocytosis so hydrea '500mg'$  daily was started on 10/20/2016 and increased to 2gm daily then back down to one daily, then 5 days per week over time due to counts drop. Then we held her hydrea since 04/09/2018 due to continued declining Hb and Plt.s  BMB 06/24/18 showed markedly hypercellular BM 95% with myeloid hyperplasia and atypical megakaryocytic hyperplasia. No significant increase in blasts. 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 BCR / ABL negative, FISH normal. Flow Showed 2%CD34-positive myeloid blasts. Myeloid precursors with low side scatter. Pathogenic variants were detected in the ASXL1, CCND2, CUX1, and U2AF1 genes.  # March 2020- wbc- 14/ Hb 9.5/ platelets- 54.   ---------------------------------------------------   # June 8th 2020- Acute myeloid leukemia [peripheral blood flow cytometry;NGS- pending]- June 15th Vidaza [SQ 1-5 + Venatoclax- #1in pt]; tumor lysis prophylaxis  # Day #28 bone marrow biopsy-  7/14- BMx- NO BLASTS; positive for dysplastic changes. Currently off Venatoclax [since 7/17] sec to cytopenias.  #817 cycle #2 Vidaza+ venetoclax 100 mg; stop venetoclax on  8/24 [severe pancytopenia]  # 02/18/2019-start gilteritinib 80 mg a day; HELD on 9/14 [sec to severe pan]  # AUG 2020- AST elevation- 248-?sec to Posonoazole; RESOLVED; continue at lower dose 100 mg/day.   # 2002 [[place]] BREAST CA s/p Lumpect RT; [? Stage I] no chemo s/p AI  # F-ONE HEM: FLT3 ALTERATION NOTED  DIAGNOSIS: ACUTE MYELOID LEUKEMIA GOALS: pallaitive  CURRENT/MOST RECENT THERAPY : Vidaza+ venotoclax.      MDS (myelodysplastic syndrome) ([place]) (Resolved)  08/28/2018 Initial Diagnosis   MDS (myelodysplastic syndrome) ([place])   Acute myeloid leukemia not having achieved remission ([place])  11/25/2018 Initial Diagnosis   AML (acute myeloid leukemia) with failed remission ([place])   11/25/2018 - 12/22/2018 Chemotherapy   The patient had azaCITIDine (VIDAZA) chemo injection 105 mg, 75 mg/m2 = 105 mg, Subcutaneous,  Once, 1 of 4 cycles Administration: 105 mg (11/25/2018), 105 mg (11/26/2018), 105 mg (11/27/2018), 105 mg (11/28/2018), 105 mg (11/29/2018)  for chemotherapy treatment.    01/27/2019 -  Chemotherapy   The patient had palonosetron (ALOXI) injection 0.25 mg, 0.25 mg, Intravenous,  Once, 1 of 4 cycles Administration: 0.25 mg (01/27/2019), 0.25 mg (01/29/2019), 0.25 mg (01/31/2019) azaCITIDine (VIDAZA) 100 mg in sodium chloride 0.9 % 50 mL chemo infusion, 75 mg/m2 = 100 mg, Intravenous, Once, 1 of 4 cycles Administration: 100 mg (01/27/2019), 100 mg (01/28/2019), 100 mg (01/29/2019), 100 mg (01/30/2019), 100 mg (01/31/2019)  for chemotherapy treatment.      HISTORY OF PRESENTING ILLNESS:  Kathryn Hahn 85 y.o.  female with acute myeloid leukemia with monocytic differentiation currently status post Vidaza approximately 6 weeks ago is here for follow-up.  Patient was started on gilteritinib September ninth; and stopped on September 14 because of severe pancytopenia.  Patient has been needing PRBC transfusion once a week.;  Patient needing platelets  approximately 2 a week.  Patient also  getting growth factor support approximately twice a week.  Patient complains of easy bruising.  Mucosal bleeding noted in the mouth.  No gum bleeds.  Patient appetite is fair.  No weight loss.  No nausea vomiting.  Mild pain in the legs.   Review of Systems  Constitutional: Positive for malaise/fatigue. Negative for chills, diaphoresis, fever and weight loss.  HENT: Negative for nosebleeds and sore throat.   Eyes: Negative for double vision.  Respiratory: Positive for shortness of breath. Negative for cough, hemoptysis, sputum production and wheezing.   Cardiovascular: Positive for leg swelling. Negative for chest pain, palpitations and orthopnea.  Gastrointestinal: Negative for abdominal pain, blood in stool, constipation, diarrhea, heartburn, melena, nausea and vomiting.  Genitourinary: Negative for dysuria, frequency and urgency.  Musculoskeletal: Negative for back pain and joint pain.  Skin: Negative.  Negative for itching and rash.  Neurological: Negative for dizziness, tingling, focal weakness, weakness and headaches.  Endo/Heme/Allergies: Bruises/bleeds easily.  Psychiatric/Behavioral: Negative for depression. The patient is not nervous/anxious and does not have insomnia.     MEDICAL HISTORY:  Past Medical History:  Diagnosis Date  . Anemia   . Arthritis   . Breast cancer ([place]) 2003   RT LUMPECTOMY  . Edema leg   . History of breast cancer 2003   post lumpectomy  . History of kidney stones   . Hyperlipemia, mixed   . Hypertension, essential   . Leukemia ([place])   . Leukocytosis   . MDS (myelodysplastic syndrome) ([place])   . Personal history of radiation therapy 2003   BREAST CA  . Renal stones   . Vitamin D deficiency     SURGICAL HISTORY: Past Surgical History:  Procedure Laterality Date  . BREAST BIOPSY Right 2003   Postive for cancer  . BREAST LUMPECTOMY Right 2003   BREAST CA  . KIDNEY STONE SURGERY Right     SOCIAL HISTORY: Social History    Socioeconomic History  . Marital status: Married    Spouse name: Not on file  . Number of children: Not on file  . Years of education: Not on file  . Highest education level: Not on file  Occupational History  . Not on file  Social Needs  . Financial resource strain: Not on file  . Food insecurity    Worry: Not on file    Inability: Not on file  . Transportation needs    Medical: Not on file    Non-medical: Not on file  Tobacco Use  . Smoking status: Never Smoker  . Smokeless tobacco: Never Used  Substance and Sexual Activity  . Alcohol use: Yes    Alcohol/week: 3.0 standard drinks    Types: 3 Glasses of wine per week  . Drug use: No  . Sexual activity: Not on file  Lifestyle  . Physical activity    Days per week: Not on file    Minutes per session: Not on file  . Stress: Not on file  Relationships  . Social Herbalist on phone: Not on file    Gets together: Not on file    Attends religious service: Not on file    Active member of club or organization: Not on file    Attends meetings of clubs or organizations: Not on file    Relationship status: Not on file  . Intimate partner violence    Fear of current or ex partner: Not on file  Emotionally abused: Not on file    Physically abused: Not on file    Forced sexual activity: Not on file  Other Topics Concern  . Not on file  Social History Narrative    No smoking/alcohol; lives in [place] with family.  She lives in assisted living with her husband.    FAMILY HISTORY: Family History  Problem Relation Age of Onset  . Stroke Mother   . Hypertension Father   . Stroke Father   . Breast cancer Neg Hx     ALLERGIES:  is allergic to terbinafine.  MEDICATIONS:  Current Outpatient Medications  Medication Sig Dispense Refill  . acetaminophen (TYLENOL) 325 MG tablet Take by mouth.    Marland Kitchen atenolol (TENORMIN) 50 MG tablet Take 1 tablet by mouth 2 (two) times daily.    . calcium carbonate (OSCAL) 1500  (600 Ca) MG TABS tablet Take 600 mg of elemental calcium by mouth daily with breakfast.    . Cholecalciferol (VITAMIN D) 2000 units tablet Take 1 tablet by mouth daily.    . Gilteritinib Fumarate (XOSPATA) 40 MG TABS Take 80 mg by mouth daily. 60 tablet 2  . heparin lock flush 100 UNIT/ML SOLN injection Inject into the vein.    . Infant Care Products (DERMACLOUD) CREA Apply topically.    . levETIRAcetam (KEPPRA) 250 MG tablet Take 1 tablet (250 mg total) by mouth every morning. 2 tablet 0  . levofloxacin (LEVAQUIN) 250 MG tablet Take 1 tablet (250 mg total) by mouth daily. 30 tablet 3  . losartan-hydrochlorothiazide (HYZAAR) 50-12.5 MG tablet Take 1 tablet by mouth daily.    . magic mouthwash w/lidocaine SOLN Take 5 mLs by mouth 4 (four) times daily as needed for mouth pain. 480 mL 3  . magnesium hydroxide (MILK OF MAGNESIA) 400 MG/5ML suspension Take 5 mLs by mouth every 4 (four) hours as needed.    . ondansetron (ZOFRAN) 8 MG tablet Take by mouth every 8 (eight) hours as needed for nausea or vomiting.    Marland Kitchen oxycodone (OXY-IR) 5 MG capsule Take 1 capsule (5 mg total) by mouth every 4 (four) hours as needed. 15 capsule 0  . polyethylene glycol (MIRALAX / GLYCOLAX) 17 g packet Take 17 g by mouth daily.    . posaconazole (NOXAFIL) 100 MG TBEC delayed-release tablet Take 2 tablets (200 mg total) by mouth daily. DO NOT START UNTIL-OK with MD. 60 tablet 3  . potassium chloride SA (K-DUR) 20 MEQ tablet Take 1 tablet (20 mEq total) by mouth 2 (two) times daily. 10 tablet 0  . senna (SENOKOT) 8.6 MG tablet Take 2 tablets by mouth 2 (two) times daily.    Marland Kitchen acyclovir (ZOVIRAX) 400 MG tablet ONE PILL A DAY [TO PREVENT SHINGLES] 90 tablet 1   No current facility-administered medications for this visit.    Facility-Administered Medications Ordered in Other Visits  Medication Dose Route Frequency Provider Last Rate Last Dose  . 0.9 %  sodium chloride infusion   Intravenous Continuous Jacquelin Hawking, NP    Stopped at 02/05/19 1630  . 0.9 %  sodium chloride infusion   Intravenous Continuous Cammie Sickle, MD   Stopped at 03/10/19 1550      .  PHYSICAL EXAMINATION: ECOG PERFORMANCE STATUS: 0 - Asymptomatic  Vitals:   03/10/19 0929  BP: 134/77  Pulse: 66  Resp: 16  Temp: 98.6 F (37 C)   Filed Weights   03/10/19 0929  Weight: 93 lb (42.2 kg)    Physical  Exam  Constitutional: She is oriented to person, place, and time and well-developed, well-nourished, and in no distress.  PICC line in place.  No active bleeding.  HENT:  Head: Normocephalic and atraumatic.  Mouth/Throat: Oropharynx is clear and moist. No oropharyngeal exudate.  Buccal ecchymosis noted/ecchymosis noted in the tongue  Eyes: Pupils are equal, round, and reactive to light.  Neck: Normal range of motion. Neck supple.  Cardiovascular: Normal rate and regular rhythm.  Pulmonary/Chest: Effort normal and breath sounds normal. No respiratory distress. She has no wheezes.  Abdominal: Soft. Bowel sounds are normal. She exhibits no distension and no mass. There is no abdominal tenderness. There is no rebound and no guarding.  Musculoskeletal: Normal range of motion.        General: No tenderness or edema.  Neurological: She is alert and oriented to person, place, and time.  Skin: Skin is warm.  Multiple chronic bruises anterior torso extremities.  Psychiatric: Affect normal.    LABORATORY DATA:  I have reviewed the data as listed Lab Results  Component Value Date   WBC 0.9 (LL) 03/10/2019   HGB 7.3 (L) 03/10/2019   HCT 22.6 (L) 03/10/2019   MCV 81.9 03/10/2019   PLT <5 (LL) 03/10/2019   Recent Labs    02/27/19 0826 03/03/19 0922 03/10/19 0910  NA 137 135 133*  K 3.2* 3.1* 3.8  CL 101 99 103  CO2 _0 GLUCOSE 118* 126* 158*  BUN _1 CREATININE 0.53 0.52 0.51  CALCIUM 8.9 8.9 8.8*  GFRNONAA >60 >60 >60  GFRAA >60 >60 >60  PROT 6.0* 6.2* 6.1*  ALBUMIN 3.5 3.6 3.6  AST _2 ALT  _3 ALKPHOS 98 99 92  BILITOT 0.7 1.0 0.9    RADIOGRAPHIC STUDIES: I have personally reviewed the radiological images as listed and agreed with the findings in the report. No results found.  ASSESSMENT & PLAN:   Acute myeloid leukemia not having achieved remission ([place]) #Acute myeloid leukemia with monocytic differentiation- S/p Vidaza cycle #2 on 8/17; plus ven 100 mg/day; VEN- HELD since 8/24; venetoclax DISCONTINUED sec to severe pancytopenia.   #Pancytopenia is likely secondary patient's chemotherapy; rather than underlying malignancy.  Patient's LDH-299/likely secondary to MDS/trending down.  As per patient appointment with [facility] tomorrow.  #Gilteritinib 80 mg a day started on September 8; stopped on September 14th-secondary to severe pancytopenia; CONTINUE TO HOLD.  # Today -white count 0.6- ANC 400; hemoglobin 7.8 platelets 3.  Continue to HOLD Gilteritinib 80 mg a day.  Proceed with PRBC transfusion; also single donor platelets.  Also proceed with nivestim today goal ANC > 0.5.   #Easy bruising/buccal mucosal ecchymosis-however no active bleeding noted.  If worse would recommend Amicar oral rinse.  #Status post mechanical fall intracranial bleed-minor bleed; I think it is reasonable to discontinue Keppra as patient did not have any seizures.   Supportive care:  # Continue Levaquin/Noxafil at 160m/acyclovir # PICC dressing weekly  #Discussed with patient's husband-at length regarding patient's plan of care/labs etc. Recommend continue  HOLDING  Gilteritinib.  Will likely plan to start at 1 pill a day with count recovery.   # Today platelets/PRBC today; Nivestim;  # labs- cbc/hold tube on 09/30 -possible platelet/ Nivestim # labs- cbc/hold tube/ 10/02- possible platelets/niveystim # follow up 10/05- cbc/cmp/LDH- HOLD tube/possible PRBC/ plaletets; nivestim Dr.B    GCammie Sickle MD 03/10/2019 5:54 PM

## 2019-03-10 NOTE — Assessment & Plan Note (Addendum)
#  Acute myeloid leukemia with monocytic differentiation- S/p Vidaza cycle #2 on 8/17; plus ven 100 mg/day; VEN- HELD since 8/24; venetoclax DISCONTINUED sec to severe pancytopenia.   #Pancytopenia is likely secondary patient's chemotherapy; rather than underlying malignancy.  Patient's LDH-299/likely secondary to MDS/trending down.  As per patient appointment with [facility] tomorrow.  #Gilteritinib 80 mg a day started on September 8; stopped on September 14th-secondary to severe pancytopenia; CONTINUE TO HOLD.  # Today -white count 0.6- ANC 400; hemoglobin 7.8 platelets 3.  Continue to HOLD Gilteritinib 80 mg a day.  Proceed with PRBC transfusion; also single donor platelets.  Also proceed with nivestim today goal ANC > 0.5.   #Easy bruising/buccal mucosal ecchymosis-however no active bleeding noted.  If worse would recommend Amicar oral rinse.  #Status post mechanical fall intracranial bleed-minor bleed; I think it is reasonable to discontinue Keppra as patient did not have any seizures.   Supportive care:  # Continue Levaquin/Noxafil at 100mg /acyclovir # PICC dressing weekly  #Discussed with patient's husband-at length regarding patient's plan of care/labs etc. Recommend continue  HOLDING  Gilteritinib.  Will likely plan to start at 1 pill a day with count recovery.   # Today platelets/PRBC today; Nivestim;  # labs- cbc/hold tube on 09/30 -possible platelet/ Nivestim # labs- cbc/hold tube/ 10/02- possible platelets/niveystim # follow up 10/05- cbc/cmp/LDH- HOLD tube/possible PRBC/ plaletets; nivestim Dr.B

## 2019-03-11 ENCOUNTER — Other Ambulatory Visit: Payer: Self-pay

## 2019-03-11 LAB — TYPE AND SCREEN
ABO/RH(D): O POS
Antibody Screen: NEGATIVE
Unit division: 0

## 2019-03-11 LAB — PREPARE PLATELET PHERESIS: Unit division: 0

## 2019-03-11 LAB — BPAM PLATELET PHERESIS
Blood Product Expiration Date: 202009302359
ISSUE DATE / TIME: 202009281431
Unit Type and Rh: 5100

## 2019-03-11 LAB — BPAM RBC
Blood Product Expiration Date: 202010122359
ISSUE DATE / TIME: 202009281123
Unit Type and Rh: 5100

## 2019-03-12 ENCOUNTER — Other Ambulatory Visit: Payer: Self-pay

## 2019-03-12 ENCOUNTER — Inpatient Hospital Stay: Payer: Medicare Other

## 2019-03-12 ENCOUNTER — Other Ambulatory Visit: Payer: Self-pay | Admitting: *Deleted

## 2019-03-12 ENCOUNTER — Inpatient Hospital Stay: Payer: Medicare Other | Admitting: *Deleted

## 2019-03-12 DIAGNOSIS — D649 Anemia, unspecified: Secondary | ICD-10-CM

## 2019-03-12 DIAGNOSIS — Z452 Encounter for adjustment and management of vascular access device: Secondary | ICD-10-CM

## 2019-03-12 DIAGNOSIS — C9200 Acute myeloblastic leukemia, not having achieved remission: Secondary | ICD-10-CM

## 2019-03-12 DIAGNOSIS — D696 Thrombocytopenia, unspecified: Secondary | ICD-10-CM

## 2019-03-12 LAB — CBC WITH DIFFERENTIAL/PLATELET
Abs Immature Granulocytes: 0.21 10*3/uL — ABNORMAL HIGH (ref 0.00–0.07)
Basophils Absolute: 0 10*3/uL (ref 0.0–0.1)
Basophils Relative: 0 %
Eosinophils Absolute: 0 10*3/uL (ref 0.0–0.5)
Eosinophils Relative: 0 %
HCT: 28.4 % — ABNORMAL LOW (ref 36.0–46.0)
Hemoglobin: 9.3 g/dL — ABNORMAL LOW (ref 12.0–15.0)
Immature Granulocytes: 17 %
Lymphocytes Relative: 24 %
Lymphs Abs: 0.3 10*3/uL — ABNORMAL LOW (ref 0.7–4.0)
MCH: 27 pg (ref 26.0–34.0)
MCHC: 32.7 g/dL (ref 30.0–36.0)
MCV: 82.3 fL (ref 80.0–100.0)
Monocytes Absolute: 0.2 10*3/uL (ref 0.1–1.0)
Monocytes Relative: 13 %
Neutro Abs: 0.6 10*3/uL — ABNORMAL LOW (ref 1.7–7.7)
Neutrophils Relative %: 46 %
Platelets: 11 10*3/uL — CL (ref 150–400)
RBC: 3.45 MIL/uL — ABNORMAL LOW (ref 3.87–5.11)
RDW: 17.4 % — ABNORMAL HIGH (ref 11.5–15.5)
Smear Review: NORMAL
WBC: 1.3 10*3/uL — CL (ref 4.0–10.5)
nRBC: 3.2 % — ABNORMAL HIGH (ref 0.0–0.2)

## 2019-03-12 MED ORDER — SODIUM CHLORIDE 0.9% FLUSH
10.0000 mL | Freq: Once | INTRAVENOUS | Status: AC
  Administered 2019-03-12: 10 mL via INTRAVENOUS
  Filled 2019-03-12: qty 10

## 2019-03-12 MED ORDER — SODIUM CHLORIDE 0.9% IV SOLUTION
250.0000 mL | Freq: Once | INTRAVENOUS | Status: AC
  Administered 2019-03-12: 250 mL via INTRAVENOUS
  Filled 2019-03-12: qty 250

## 2019-03-12 MED ORDER — ACETAMINOPHEN 325 MG PO TABS
650.0000 mg | ORAL_TABLET | Freq: Once | ORAL | Status: AC
  Administered 2019-03-12: 650 mg via ORAL
  Filled 2019-03-12: qty 2

## 2019-03-12 MED ORDER — HEPARIN SOD (PORK) LOCK FLUSH 100 UNIT/ML IV SOLN
500.0000 [IU] | Freq: Every day | INTRAVENOUS | Status: AC | PRN
  Administered 2019-03-12: 500 [IU]
  Filled 2019-03-12: qty 5

## 2019-03-12 MED ORDER — DIPHENHYDRAMINE HCL 25 MG PO CAPS
25.0000 mg | ORAL_CAPSULE | Freq: Once | ORAL | Status: AC
  Administered 2019-03-12: 25 mg via ORAL
  Filled 2019-03-12: qty 1

## 2019-03-13 ENCOUNTER — Telehealth: Payer: Self-pay

## 2019-03-13 LAB — PREPARE PLATELET PHERESIS: Unit division: 0

## 2019-03-13 LAB — BPAM PLATELET PHERESIS
Blood Product Expiration Date: 202010022359
ISSUE DATE / TIME: 202009301203
Unit Type and Rh: 5100

## 2019-03-13 NOTE — Telephone Encounter (Signed)
Nutrition Follow-up:  Patient with acute myeloid leukemia.  Patient currently not on treatment due to severe pancytopenia.    Called patient for nutrition follow-up.  Patient reports that she is eating 3 meals per day and sometimes evening snacks.  Reports that she is drinking ensure plus about 1 time per day.  Reports that she is home from rehab ([facility]). Reports that meals in rehab were very good and often nursing offered snacks.  Reports that she gets full quick.  Reports that breakfast is often 2 eggs with pancake or waffle and fruit. Lunch is sandwich with meat and fruit and dinner is chicken or salmon with vegetables and dessert.      Medications: reviewed  Labs: Na 133, glucose 158  Anthropometrics:   Weight decreased to 93 lb on 9/28 from 96 lb on 7/9.     NUTRITION DIAGNOSIS: Unintentional weight loss continues   INTERVENTION:  Encouraged patient to try drinking ensure plus BID Reviewed strategies to increase calories and protein with patient.  Contact information provided    MONITORING, EVALUATION, GOAL: patient will consume adequate calories and protein to prevent further weight loss   NEXT VISIT: phone f/u October 29  Kathryn Hahn, [place], [place] Registered Dietitian [phone] (pager)

## 2019-03-14 ENCOUNTER — Other Ambulatory Visit: Payer: Self-pay

## 2019-03-14 ENCOUNTER — Inpatient Hospital Stay: Payer: Medicare Other

## 2019-03-14 ENCOUNTER — Inpatient Hospital Stay: Payer: Medicare Other | Attending: Internal Medicine | Admitting: *Deleted

## 2019-03-14 VITALS — BP 120/78 | HR 71 | Temp 96.1°F | Resp 18

## 2019-03-14 DIAGNOSIS — I629 Nontraumatic intracranial hemorrhage, unspecified: Secondary | ICD-10-CM | POA: Insufficient documentation

## 2019-03-14 DIAGNOSIS — I10 Essential (primary) hypertension: Secondary | ICD-10-CM | POA: Insufficient documentation

## 2019-03-14 DIAGNOSIS — E785 Hyperlipidemia, unspecified: Secondary | ICD-10-CM | POA: Diagnosis not present

## 2019-03-14 DIAGNOSIS — D696 Thrombocytopenia, unspecified: Secondary | ICD-10-CM

## 2019-03-14 DIAGNOSIS — Z452 Encounter for adjustment and management of vascular access device: Secondary | ICD-10-CM

## 2019-03-14 DIAGNOSIS — R233 Spontaneous ecchymoses: Secondary | ICD-10-CM | POA: Diagnosis not present

## 2019-03-14 DIAGNOSIS — D469 Myelodysplastic syndrome, unspecified: Secondary | ICD-10-CM

## 2019-03-14 DIAGNOSIS — D649 Anemia, unspecified: Secondary | ICD-10-CM

## 2019-03-14 DIAGNOSIS — Z79899 Other long term (current) drug therapy: Secondary | ICD-10-CM | POA: Diagnosis not present

## 2019-03-14 DIAGNOSIS — C9200 Acute myeloblastic leukemia, not having achieved remission: Secondary | ICD-10-CM

## 2019-03-14 DIAGNOSIS — W19XXXA Unspecified fall, initial encounter: Secondary | ICD-10-CM | POA: Insufficient documentation

## 2019-03-14 DIAGNOSIS — M1990 Unspecified osteoarthritis, unspecified site: Secondary | ICD-10-CM | POA: Diagnosis not present

## 2019-03-14 DIAGNOSIS — D61818 Other pancytopenia: Secondary | ICD-10-CM | POA: Insufficient documentation

## 2019-03-14 DIAGNOSIS — D701 Agranulocytosis secondary to cancer chemotherapy: Secondary | ICD-10-CM | POA: Insufficient documentation

## 2019-03-14 LAB — CBC WITH DIFFERENTIAL/PLATELET
Abs Immature Granulocytes: 0.08 10*3/uL — ABNORMAL HIGH (ref 0.00–0.07)
Basophils Absolute: 0 10*3/uL (ref 0.0–0.1)
Basophils Relative: 0 %
Eosinophils Absolute: 0 10*3/uL (ref 0.0–0.5)
Eosinophils Relative: 0 %
HCT: 26.7 % — ABNORMAL LOW (ref 36.0–46.0)
Hemoglobin: 8.7 g/dL — ABNORMAL LOW (ref 12.0–15.0)
Immature Granulocytes: 9 %
Lymphocytes Relative: 38 %
Lymphs Abs: 0.3 10*3/uL — ABNORMAL LOW (ref 0.7–4.0)
MCH: 26.7 pg (ref 26.0–34.0)
MCHC: 32.6 g/dL (ref 30.0–36.0)
MCV: 81.9 fL (ref 80.0–100.0)
Monocytes Absolute: 0.1 10*3/uL (ref 0.1–1.0)
Monocytes Relative: 12 %
Neutro Abs: 0.3 10*3/uL — ABNORMAL LOW (ref 1.7–7.7)
Neutrophils Relative %: 41 %
Platelets: 9 10*3/uL — CL (ref 150–400)
RBC: 3.26 MIL/uL — ABNORMAL LOW (ref 3.87–5.11)
RDW: 18 % — ABNORMAL HIGH (ref 11.5–15.5)
WBC: 0.9 10*3/uL — CL (ref 4.0–10.5)
nRBC: 4.4 % — ABNORMAL HIGH (ref 0.0–0.2)

## 2019-03-14 LAB — SAMPLE TO BLOOD BANK

## 2019-03-14 MED ORDER — SODIUM CHLORIDE 0.9% FLUSH
10.0000 mL | Freq: Once | INTRAVENOUS | Status: AC
  Administered 2019-03-14: 10 mL via INTRAVENOUS
  Filled 2019-03-14: qty 10

## 2019-03-14 MED ORDER — ACETAMINOPHEN 325 MG PO TABS
650.0000 mg | ORAL_TABLET | Freq: Once | ORAL | Status: AC
  Administered 2019-03-14: 650 mg via ORAL
  Filled 2019-03-14: qty 2

## 2019-03-14 MED ORDER — FILGRASTIM-AAFI 300 MCG/0.5ML IJ SOSY
300.0000 ug | PREFILLED_SYRINGE | Freq: Once | INTRAMUSCULAR | Status: AC
  Administered 2019-03-14: 12:00:00 300 ug via SUBCUTANEOUS
  Filled 2019-03-14: qty 0.5

## 2019-03-14 MED ORDER — HEPARIN SOD (PORK) LOCK FLUSH 100 UNIT/ML IV SOLN
250.0000 [IU] | INTRAVENOUS | Status: AC | PRN
  Administered 2019-03-14: 250 [IU]
  Filled 2019-03-14: qty 5

## 2019-03-14 MED ORDER — DIPHENHYDRAMINE HCL 25 MG PO CAPS
25.0000 mg | ORAL_CAPSULE | Freq: Once | ORAL | Status: AC
  Administered 2019-03-14: 25 mg via ORAL
  Filled 2019-03-14: qty 1

## 2019-03-14 MED ORDER — SODIUM CHLORIDE 0.9% IV SOLUTION
250.0000 mL | Freq: Once | INTRAVENOUS | Status: AC
  Administered 2019-03-14: 250 mL via INTRAVENOUS
  Filled 2019-03-14: qty 250

## 2019-03-15 LAB — PREPARE PLATELET PHERESIS: Unit division: 0

## 2019-03-15 LAB — BPAM PLATELET PHERESIS
Blood Product Expiration Date: 202010052359
ISSUE DATE / TIME: 202010021323
Unit Type and Rh: 5100

## 2019-03-16 NOTE — Progress Notes (Signed)
[facility] CONSULT NOTE  Patient Care Team: Kathryn Aus, MD as PCP - General (Internal Medicine)  CHIEF COMPLAINTS/PURPOSE OF CONSULTATION: Acute myeloid leukemia   Oncology History Overview Note  # SEP 2017- MYELOPROLIFERATIVE NEOPLASM- [WBC- 59; normal Hb/platelets] hypercellular bone marrow 90-95% proliferation of myeloid cells in various stages of maturation; relative erythroid hypoplasia and proliferation of atypical megakaryocytes; no increase in blasts; peripheral blood Bcr-Abl-NEG; Cytogenetics- WNL. NEG- Jak-2/MPL/CALR [place] limited- mild splenomegaly [~10cm; 463cm3]; OCT 2017- second opinion at [facility]. Surveillance.   # With progressive leukocytosis we evaluated her a month ago and sent BM for exam. She has a progressive leukocytosis so hydrea '500mg'$  daily was started on 10/20/2016 and increased to 2gm daily then back down to one daily, then 5 days per week over time due to counts drop. Then we held her hydrea since 04/09/2018 due to continued declining Hb and Plt.s  BMB 06/24/18 showed markedly hypercellular BM 95% with myeloid hyperplasia and atypical megakaryocytic hyperplasia. No significant increase in blasts. 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 BCR / ABL negative, FISH normal. Flow Showed 2%CD34-positive myeloid blasts. Myeloid precursors with low side scatter. Pathogenic variants were detected in the ASXL1, CCND2, CUX1, and U2AF1 genes.  # March 2020- wbc- 14/ Hb 9.5/ platelets- 54.   ---------------------------------------------------   # June 8th 2020- Acute myeloid leukemia [peripheral blood flow cytometry;NGS- pending]- June 15th Vidaza [SQ 1-5 + Venatoclax- #1in pt]; tumor lysis prophylaxis  # Day #28 bone marrow biopsy-  7/14- BMx- NO BLASTS; positive for dysplastic changes. OFF Venatoclax [since 7/17] sec to cytopenias.  #817 cycle #2 Vidaza+ venetoclax 100 mg; stop venetoclax on 8/24  [severe pancytopenia]  # 02/18/2019-start gilteritinib 80 mg a day; HELD on 9/14 [sec to severe pan]  # AUG 2020- AST elevation- 248-?sec to Posonoazole; RESOLVED; continue at lower dose 100 mg/day.   # 2002 [[place]] BREAST CA s/p Lumpect RT; [? Stage I] no chemo s/p AI  # F-ONE HEM: FLT3 ALTERATION NOTED  DIAGNOSIS: ACUTE MYELOID LEUKEMIA GOALS: pallaitive  CURRENT/MOST RECENT THERAPY : Vidaza+ venotoclax.      MDS (myelodysplastic syndrome) ([place]) (Resolved)  08/28/2018 Initial Diagnosis   MDS (myelodysplastic syndrome) ([place])   Acute myeloid leukemia not having achieved remission ([place])  11/25/2018 Initial Diagnosis   AML (acute myeloid leukemia) with failed remission ([place])   11/25/2018 - 12/22/2018 Chemotherapy   The patient had azaCITIDine (VIDAZA) chemo injection 105 mg, 75 mg/m2 = 105 mg, Subcutaneous,  Once, 1 of 4 cycles Administration: 105 mg (11/25/2018), 105 mg (11/26/2018), 105 mg (11/27/2018), 105 mg (11/28/2018), 105 mg (11/29/2018)  for chemotherapy treatment.    01/27/2019 -  Chemotherapy   The patient had palonosetron (ALOXI) injection 0.25 mg, 0.25 mg, Intravenous,  Once, 1 of 4 cycles Administration: 0.25 mg (01/27/2019), 0.25 mg (01/29/2019), 0.25 mg (01/31/2019) azaCITIDine (VIDAZA) 100 mg in sodium chloride 0.9 % 50 mL chemo infusion, 75 mg/m2 = 100 mg, Intravenous, Once, 1 of 4 cycles Administration: 100 mg (01/27/2019), 100 mg (01/28/2019), 100 mg (01/29/2019), 100 mg (01/30/2019), 100 mg (01/31/2019)  for chemotherapy treatment.      HISTORY OF PRESENTING ILLNESS:  Kathryn Hahn 85 y.o.  female with acute myeloid leukemia with monocytic differentiation currently on surveillance/off any therapies because of severe pancytopenia from chemotherapy is here for follow-up.    Patient has been needing PRBC transfusion once a week' platelet transfusions about 2 a week.  Growth factor support twice a week.  Denies any fevers  or chills.  No nausea no vomiting.  No diarrhea.   Energy improving.    Review of Systems  Constitutional: Positive for malaise/fatigue. Negative for chills, diaphoresis, fever and weight loss.  HENT: Negative for nosebleeds and sore throat.   Eyes: Negative for double vision.  Respiratory: Positive for shortness of breath. Negative for cough, hemoptysis, sputum production and wheezing.   Cardiovascular: Positive for leg swelling. Negative for chest pain, palpitations and orthopnea.  Gastrointestinal: Negative for abdominal pain, blood in stool, constipation, diarrhea, heartburn, melena, nausea and vomiting.  Genitourinary: Negative for dysuria, frequency and urgency.  Musculoskeletal: Negative for back pain and joint pain.  Skin: Negative.  Negative for itching and rash.  Neurological: Negative for dizziness, tingling, focal weakness, weakness and headaches.  Endo/Heme/Allergies: Bruises/bleeds easily.  Psychiatric/Behavioral: Negative for depression. The patient is not nervous/anxious and does not have insomnia.     MEDICAL HISTORY:  Past Medical History:  Diagnosis Date  . Anemia   . Arthritis   . Breast cancer ([place]) 2003   RT LUMPECTOMY  . Edema leg   . History of breast cancer 2003   post lumpectomy  . History of kidney stones   . Hyperlipemia, mixed   . Hypertension, essential   . Leukemia ([place])   . Leukocytosis   . MDS (myelodysplastic syndrome) ([place])   . Personal history of radiation therapy 2003   BREAST CA  . Renal stones   . Vitamin D deficiency     SURGICAL HISTORY: Past Surgical History:  Procedure Laterality Date  . BREAST BIOPSY Right 2003   Postive for cancer  . BREAST LUMPECTOMY Right 2003   BREAST CA  . KIDNEY STONE SURGERY Right     SOCIAL HISTORY: Social History   Socioeconomic History  . Marital status: Married    Spouse name: Not on file  . Number of children: Not on file  . Years of education: Not on file  . Highest education level: Not on file  Occupational History  . Not on file   Social Needs  . Financial resource strain: Not on file  . Food insecurity    Worry: Not on file    Inability: Not on file  . Transportation needs    Medical: Not on file    Non-medical: Not on file  Tobacco Use  . Smoking status: Never Smoker  . Smokeless tobacco: Never Used  Substance and Sexual Activity  . Alcohol use: Yes    Alcohol/week: 3.0 standard drinks    Types: 3 Glasses of wine per week  . Drug use: No  . Sexual activity: Not on file  Lifestyle  . Physical activity    Days per week: Not on file    Minutes per session: Not on file  . Stress: Not on file  Relationships  . Social Herbalist on phone: Not on file    Gets together: Not on file    Attends religious service: Not on file    Active member of club or organization: Not on file    Attends meetings of clubs or organizations: Not on file    Relationship status: Not on file  . Intimate partner violence    Fear of current or ex partner: Not on file    Emotionally abused: Not on file    Physically abused: Not on file    Forced sexual activity: Not on file  Other Topics Concern  . Not on file  Social History Narrative  No smoking/alcohol; lives in [place] with family.  She lives in assisted living with her husband.    FAMILY HISTORY: Family History  Problem Relation Age of Onset  . Stroke Mother   . Hypertension Father   . Stroke Father   . Breast cancer Neg Hx     ALLERGIES:  is allergic to terbinafine.  MEDICATIONS:  Current Outpatient Medications  Medication Sig Dispense Refill  . acetaminophen (TYLENOL) 325 MG tablet Take by mouth.    Marland Kitchen acyclovir (ZOVIRAX) 400 MG tablet ONE PILL A DAY [TO PREVENT SHINGLES] 90 tablet 1  . atenolol (TENORMIN) 50 MG tablet Take 1 tablet by mouth 2 (two) times daily.    . calcium carbonate (OSCAL) 1500 (600 Ca) MG TABS tablet Take 600 mg of elemental calcium by mouth daily with breakfast.    . Cholecalciferol (VITAMIN D) 2000 units tablet Take 1  tablet by mouth daily.    . Gilteritinib Fumarate (XOSPATA) 40 MG TABS Take 80 mg by mouth daily. 60 tablet 2  . Infant Care Products (DERMACLOUD) CREA Apply topically.    Marland Kitchen levofloxacin (LEVAQUIN) 250 MG tablet Take 1 tablet (250 mg total) by mouth daily. 30 tablet 3  . losartan-hydrochlorothiazide (HYZAAR) 50-12.5 MG tablet Take 1 tablet by mouth daily.    . magic mouthwash w/lidocaine SOLN Take 5 mLs by mouth 4 (four) times daily as needed for mouth pain. 480 mL 3  . magnesium hydroxide (MILK OF MAGNESIA) 400 MG/5ML suspension Take 5 mLs by mouth every 4 (four) hours as needed.    . ondansetron (ZOFRAN) 8 MG tablet Take by mouth every 8 (eight) hours as needed for nausea or vomiting.    Marland Kitchen oxycodone (OXY-IR) 5 MG capsule Take 1 capsule (5 mg total) by mouth every 4 (four) hours as needed. 15 capsule 0  . polyethylene glycol (MIRALAX / GLYCOLAX) 17 g packet Take 17 g by mouth daily.    . posaconazole (NOXAFIL) 100 MG TBEC delayed-release tablet Take 2 tablets (200 mg total) by mouth daily. DO NOT START UNTIL-OK with MD. 60 tablet 3  . potassium chloride SA (K-DUR) 20 MEQ tablet Take 1 tablet (20 mEq total) by mouth 2 (two) times daily. 10 tablet 0  . senna (SENOKOT) 8.6 MG tablet Take 2 tablets by mouth 2 (two) times daily.    . heparin lock flush 100 UNIT/ML SOLN injection Inject into the vein.     No current facility-administered medications for this visit.    Facility-Administered Medications Ordered in Other Visits  Medication Dose Route Frequency Provider Last Rate Last Dose  . 0.9 %  sodium chloride infusion   Intravenous Continuous Jacquelin Hawking, NP   Stopped at 02/05/19 1630      .  PHYSICAL EXAMINATION: ECOG PERFORMANCE STATUS: 0 - Asymptomatic  Vitals:   03/17/19 0928  BP: 121/75  Pulse: 74  Temp: 97.7 F (36.5 C)   Filed Weights   03/17/19 0928  Weight: 89 lb 8 oz (40.6 kg)    Physical Exam  Constitutional: She is oriented to person, place, and time and  well-developed, well-nourished, and in no distress.  PICC line in place.  No active bleeding.  HENT:  Head: Normocephalic and atraumatic.  Mouth/Throat: Oropharynx is clear and moist. No oropharyngeal exudate.  Eyes: Pupils are equal, round, and reactive to light.  Neck: Normal range of motion. Neck supple.  Cardiovascular: Normal rate and regular rhythm.  Pulmonary/Chest: Effort normal and breath sounds normal. No respiratory distress. She  has no wheezes.  Abdominal: Soft. Bowel sounds are normal. She exhibits no distension and no mass. There is no abdominal tenderness. There is no rebound and no guarding.  Musculoskeletal: Normal range of motion.        General: No tenderness or edema.  Neurological: She is alert and oriented to person, place, and time.  Skin: Skin is warm.  Multiple chronic bruises anterior torso extremities.  Psychiatric: Affect normal.    LABORATORY DATA:  I have reviewed the data as listed Lab Results  Component Value Date   WBC 1.3 (LL) 03/21/2019   HGB 9.3 (L) 03/21/2019   HCT 28.0 (L) 03/21/2019   MCV 83.8 03/21/2019   PLT 12 (LL) 03/21/2019   Recent Labs    03/03/19 0922 03/10/19 0910 03/17/19 0843  NA 135 133* 138  K 3.1* 3.8 3.6  CL 99 103 101  CO2 '26 26 23  '$ GLUCOSE 126* 158* 176*  BUN 20 20 25*  CREATININE 0.52 0.51 0.45  CALCIUM 8.9 8.8* 8.9  GFRNONAA >60 >60 >60  GFRAA >60 >60 >60  PROT 6.2* 6.1* 6.2*  ALBUMIN 3.6 3.6 3.7  AST '19 19 23  '$ ALT '16 13 13  '$ ALKPHOS 99 92 89  BILITOT 1.0 0.9 0.9    RADIOGRAPHIC STUDIES: I have personally reviewed the radiological images as listed and agreed with the findings in the report. No results found.  ASSESSMENT & PLAN:   Acute myeloid leukemia not having achieved remission ([place]) #Acute myeloid leukemia with monocytic differentiation-  Currently second line GILTERITINIB on hold since 9/14. Currently on transfusion support.   #Pancytopenia is likely secondary patient's  chemotherapy/gilteritinib; rather than underlying malignancy.  Patient's LDH-270/likely secondary to MDS/trending down.  # Today -white count 0.6- ANC 300 hemoglobin 8.3  platelets 9.  Continue to HOLD Gilteritinib 80 mg a day.  Proceed with 1 unit platelets transfusion; also single donor platelets.  Also proceed with nivestim today goal ANC > 0.5.   #Easy bruising/buccal mucosal ecchymosis-however no active bleeding noted.  If worse would recommend Amicar oral rinse.  Stable  #Status post mechanical fall intracranial bleed-minor bleed no seizures.   Supportive care:  # Continue Levaquin/Noxafil at '100mg'$ /acyclovir # PICC dressing weekly  #Discussed with patient's husband-at length regarding patient's plan of care/labs etc. Recommend continue  HOLDING  Gilteritinib.  Will likely plan to start at 1 pill [40 mg] a day with count recovery.  Also discussed with Dr. Alcide Goodness at [facility].  # DISPOSITION: # Today platelets; Nivestim;  # labs- cbc/hold tube on 10/07-possible UNITS platelet/1 UNIT PRBC Nivestim # labs- cbc/hold tube/ 10/009- possible platelets/niveystim # follow up 10/12- cbc/cmp/LDH- HOLD tube/possible PRBC/ plaletets; nivestim Dr.B    Cammie Sickle, MD 03/23/2019 8:57 PM

## 2019-03-16 NOTE — Assessment & Plan Note (Addendum)
#  Acute myeloid leukemia with monocytic differentiation-  Currently second line GILTERITINIB on hold since 9/14. Currently on transfusion support.   #Pancytopenia is likely secondary patient's chemotherapy/gilteritinib; rather than underlying malignancy.  Patient's LDH-270/likely secondary to MDS/trending down.  # Today -white count 0.6- ANC 300 hemoglobin 8.3  platelets 9.  Continue to HOLD Gilteritinib 80 mg a day.  Proceed with 1 unit platelets transfusion; also single donor platelets.  Also proceed with nivestim today goal ANC > 0.5.   #Easy bruising/buccal mucosal ecchymosis-however no active bleeding noted.  If worse would recommend Amicar oral rinse.  Stable  #Status post mechanical fall intracranial bleed-minor bleed no seizures.   Supportive care:  # Continue Levaquin/Noxafil at 100mg /acyclovir # PICC dressing weekly  #Discussed with patient's husband-at length regarding patient's plan of care/labs etc. Recommend continue  HOLDING  Gilteritinib.  Will likely plan to start at 1 pill [40 mg] a day with count recovery.  Also discussed with Dr. Alcide Goodness at [facility].  # DISPOSITION: # Today platelets; Nivestim;  # labs- cbc/hold tube on 10/07-possible UNITS platelet/1 UNIT PRBC Nivestim # labs- cbc/hold tube/ 10/009- possible platelets/niveystim # follow up 10/12- cbc/cmp/LDH- HOLD tube/possible PRBC/ plaletets; nivestim Dr.B

## 2019-03-17 ENCOUNTER — Inpatient Hospital Stay: Payer: Medicare Other

## 2019-03-17 ENCOUNTER — Other Ambulatory Visit: Payer: Self-pay

## 2019-03-17 ENCOUNTER — Inpatient Hospital Stay: Payer: Medicare Other | Admitting: *Deleted

## 2019-03-17 ENCOUNTER — Inpatient Hospital Stay (HOSPITAL_BASED_OUTPATIENT_CLINIC_OR_DEPARTMENT_OTHER): Payer: Medicare Other | Admitting: Internal Medicine

## 2019-03-17 ENCOUNTER — Other Ambulatory Visit: Payer: Self-pay | Admitting: *Deleted

## 2019-03-17 VITALS — BP 128/63 | HR 66 | Temp 97.0°F | Resp 17

## 2019-03-17 DIAGNOSIS — D469 Myelodysplastic syndrome, unspecified: Secondary | ICD-10-CM

## 2019-03-17 DIAGNOSIS — C9200 Acute myeloblastic leukemia, not having achieved remission: Secondary | ICD-10-CM | POA: Diagnosis not present

## 2019-03-17 DIAGNOSIS — D696 Thrombocytopenia, unspecified: Secondary | ICD-10-CM

## 2019-03-17 DIAGNOSIS — D649 Anemia, unspecified: Secondary | ICD-10-CM

## 2019-03-17 DIAGNOSIS — Z452 Encounter for adjustment and management of vascular access device: Secondary | ICD-10-CM

## 2019-03-17 LAB — COMPREHENSIVE METABOLIC PANEL
ALT: 13 U/L (ref 0–44)
AST: 23 U/L (ref 15–41)
Albumin: 3.7 g/dL (ref 3.5–5.0)
Alkaline Phosphatase: 89 U/L (ref 38–126)
Anion gap: 14 (ref 5–15)
BUN: 25 mg/dL — ABNORMAL HIGH (ref 8–23)
CO2: 23 mmol/L (ref 22–32)
Calcium: 8.9 mg/dL (ref 8.9–10.3)
Chloride: 101 mmol/L (ref 98–111)
Creatinine, Ser: 0.45 mg/dL (ref 0.44–1.00)
GFR calc Af Amer: >60 mL/min (ref >60)
GFR calc non Af Amer: >60 mL/min (ref >60)
Glucose, Bld: 176 mg/dL — ABNORMAL HIGH (ref 70–99)
Potassium: 3.6 mmol/L (ref 3.5–5.1)
Sodium: 138 mmol/L (ref 135–145)
Total Bilirubin: 0.9 mg/dL (ref 0.3–1.2)
Total Protein: 6.2 g/dL — ABNORMAL LOW (ref 6.5–8.1)

## 2019-03-17 LAB — SAMPLE TO BLOOD BANK

## 2019-03-17 LAB — CBC WITH DIFFERENTIAL/PLATELET
Abs Immature Granulocytes: 0.11 10*3/uL — ABNORMAL HIGH (ref 0.00–0.07)
Basophils Absolute: 0 10*3/uL (ref 0.0–0.1)
Basophils Relative: 0 %
Eosinophils Absolute: 0 10*3/uL (ref 0.0–0.5)
Eosinophils Relative: 1 %
HCT: 24.8 % — ABNORMAL LOW (ref 36.0–46.0)
Hemoglobin: 8.1 g/dL — ABNORMAL LOW (ref 12.0–15.0)
Immature Granulocytes: 14 %
Lymphocytes Relative: 33 %
Lymphs Abs: 0.3 10*3/uL — ABNORMAL LOW (ref 0.7–4.0)
MCH: 26.7 pg (ref 26.0–34.0)
MCHC: 32.7 g/dL (ref 30.0–36.0)
MCV: 81.8 fL (ref 80.0–100.0)
Monocytes Absolute: 0.1 10*3/uL (ref 0.1–1.0)
Monocytes Relative: 18 %
Neutro Abs: 0.3 10*3/uL — ABNORMAL LOW (ref 1.7–7.7)
Neutrophils Relative %: 34 %
Platelets: 9 10*3/uL — CL (ref 150–400)
RBC: 3.03 MIL/uL — ABNORMAL LOW (ref 3.87–5.11)
RDW: 18.4 % — ABNORMAL HIGH (ref 11.5–15.5)
Smear Review: DECREASED
WBC: 0.8 10*3/uL — CL (ref 4.0–10.5)
nRBC: 5 % — ABNORMAL HIGH (ref 0.0–0.2)

## 2019-03-17 LAB — LACTATE DEHYDROGENASE: LDH: 276 U/L — ABNORMAL HIGH (ref 98–192)

## 2019-03-17 MED ORDER — HEPARIN SOD (PORK) LOCK FLUSH 100 UNIT/ML IV SOLN
500.0000 [IU] | Freq: Every day | INTRAVENOUS | Status: AC | PRN
  Administered 2019-03-17: 12:00:00 250 [IU]
  Filled 2019-03-17: qty 5

## 2019-03-17 MED ORDER — ACETAMINOPHEN 325 MG PO TABS
650.0000 mg | ORAL_TABLET | Freq: Once | ORAL | Status: AC
  Administered 2019-03-17: 650 mg via ORAL
  Filled 2019-03-17: qty 2

## 2019-03-17 MED ORDER — DIPHENHYDRAMINE HCL 25 MG PO CAPS
25.0000 mg | ORAL_CAPSULE | Freq: Once | ORAL | Status: AC
  Administered 2019-03-17: 11:00:00 25 mg via ORAL
  Filled 2019-03-17: qty 1

## 2019-03-17 MED ORDER — FILGRASTIM-AAFI 300 MCG/0.5ML IJ SOSY
300.0000 ug | PREFILLED_SYRINGE | Freq: Once | INTRAMUSCULAR | Status: AC
  Administered 2019-03-17: 11:00:00 300 ug via SUBCUTANEOUS
  Filled 2019-03-17: qty 0.5

## 2019-03-17 MED ORDER — SODIUM CHLORIDE 0.9% IV SOLUTION
250.0000 mL | Freq: Once | INTRAVENOUS | Status: AC
  Administered 2019-03-17: 11:00:00 250 mL via INTRAVENOUS
  Filled 2019-03-17: qty 250

## 2019-03-17 MED ORDER — SODIUM CHLORIDE 0.9% FLUSH
10.0000 mL | Freq: Once | INTRAVENOUS | Status: AC
  Administered 2019-03-17: 09:00:00 10 mL via INTRAVENOUS
  Filled 2019-03-17: qty 10

## 2019-03-17 NOTE — Progress Notes (Signed)
Patient here today for follow up.  Patient denies any nausea, vomiting, diarrhea, constipation or pain.  Patient does c/o SOB with exertion.

## 2019-03-18 ENCOUNTER — Other Ambulatory Visit: Payer: Self-pay

## 2019-03-18 DIAGNOSIS — E440 Moderate protein-calorie malnutrition: Secondary | ICD-10-CM | POA: Insufficient documentation

## 2019-03-18 LAB — PREPARE PLATELET PHERESIS: Unit division: 0

## 2019-03-18 LAB — BPAM PLATELET PHERESIS
Blood Product Expiration Date: 202010082359
ISSUE DATE / TIME: 202010051053
Unit Type and Rh: 6200

## 2019-03-19 ENCOUNTER — Inpatient Hospital Stay: Payer: Medicare Other | Admitting: *Deleted

## 2019-03-19 ENCOUNTER — Other Ambulatory Visit: Payer: Self-pay

## 2019-03-19 ENCOUNTER — Inpatient Hospital Stay: Payer: Medicare Other

## 2019-03-19 ENCOUNTER — Other Ambulatory Visit: Payer: Self-pay | Admitting: *Deleted

## 2019-03-19 VITALS — BP 100/63 | HR 70 | Temp 98.3°F | Resp 18

## 2019-03-19 DIAGNOSIS — D469 Myelodysplastic syndrome, unspecified: Secondary | ICD-10-CM

## 2019-03-19 DIAGNOSIS — C9200 Acute myeloblastic leukemia, not having achieved remission: Secondary | ICD-10-CM

## 2019-03-19 DIAGNOSIS — Z452 Encounter for adjustment and management of vascular access device: Secondary | ICD-10-CM

## 2019-03-19 LAB — CBC WITH DIFFERENTIAL/PLATELET
Abs Immature Granulocytes: 0.11 10*3/uL — ABNORMAL HIGH (ref 0.00–0.07)
Basophils Absolute: 0 10*3/uL (ref 0.0–0.1)
Basophils Relative: 0 %
Eosinophils Absolute: 0 10*3/uL (ref 0.0–0.5)
Eosinophils Relative: 0 %
HCT: 24.5 % — ABNORMAL LOW (ref 36.0–46.0)
Hemoglobin: 7.9 g/dL — ABNORMAL LOW (ref 12.0–15.0)
Immature Granulocytes: 10 %
Lymphocytes Relative: 30 %
Lymphs Abs: 0.3 10*3/uL — ABNORMAL LOW (ref 0.7–4.0)
MCH: 26.5 pg (ref 26.0–34.0)
MCHC: 32.2 g/dL (ref 30.0–36.0)
MCV: 82.2 fL (ref 80.0–100.0)
Monocytes Absolute: 0.2 10*3/uL (ref 0.1–1.0)
Monocytes Relative: 16 %
Neutro Abs: 0.5 10*3/uL — ABNORMAL LOW (ref 1.7–7.7)
Neutrophils Relative %: 44 %
Platelets: 10 10*3/uL — CL (ref 150–400)
RBC: 2.98 MIL/uL — ABNORMAL LOW (ref 3.87–5.11)
RDW: 18.2 % — ABNORMAL HIGH (ref 11.5–15.5)
Smear Review: DECREASED
WBC: 1.1 10*3/uL — CL (ref 4.0–10.5)
nRBC: 2.7 % — ABNORMAL HIGH (ref 0.0–0.2)

## 2019-03-19 LAB — PREPARE RBC (CROSSMATCH)

## 2019-03-19 MED ORDER — ACETAMINOPHEN 325 MG PO TABS
650.0000 mg | ORAL_TABLET | Freq: Once | ORAL | Status: AC
  Administered 2019-03-19: 12:00:00 650 mg via ORAL
  Filled 2019-03-19: qty 2

## 2019-03-19 MED ORDER — DIPHENHYDRAMINE HCL 25 MG PO CAPS
25.0000 mg | ORAL_CAPSULE | Freq: Once | ORAL | Status: AC
  Administered 2019-03-19: 12:00:00 25 mg via ORAL
  Filled 2019-03-19: qty 1

## 2019-03-19 MED ORDER — SODIUM CHLORIDE 0.9% FLUSH
10.0000 mL | Freq: Once | INTRAVENOUS | Status: AC
  Administered 2019-03-19: 09:00:00 10 mL via INTRAVENOUS
  Filled 2019-03-19: qty 10

## 2019-03-19 MED ORDER — SODIUM CHLORIDE 0.9% IV SOLUTION
250.0000 mL | Freq: Once | INTRAVENOUS | Status: AC
  Administered 2019-03-19: 11:00:00 250 mL via INTRAVENOUS
  Filled 2019-03-19: qty 250

## 2019-03-19 MED ORDER — FILGRASTIM-AAFI 300 MCG/0.5ML IJ SOSY
300.0000 ug | PREFILLED_SYRINGE | Freq: Once | INTRAMUSCULAR | Status: AC
  Administered 2019-03-19: 300 ug via SUBCUTANEOUS
  Filled 2019-03-19: qty 0.5

## 2019-03-19 MED ORDER — HEPARIN SOD (PORK) LOCK FLUSH 100 UNIT/ML IV SOLN
500.0000 [IU] | Freq: Once | INTRAVENOUS | Status: AC
  Administered 2019-03-19: 500 [IU] via INTRAVENOUS

## 2019-03-20 ENCOUNTER — Other Ambulatory Visit: Payer: Self-pay

## 2019-03-20 LAB — PREPARE PLATELET PHERESIS: Unit division: 0

## 2019-03-20 LAB — BPAM RBC
Blood Product Expiration Date: 202010242359
ISSUE DATE / TIME: 202010071404
Unit Type and Rh: 5100

## 2019-03-20 LAB — TYPE AND SCREEN
ABO/RH(D): O POS
Antibody Screen: NEGATIVE
Unit division: 0

## 2019-03-20 LAB — BPAM PLATELET PHERESIS
Blood Product Expiration Date: 202010092359
ISSUE DATE / TIME: 202010071232
Unit Type and Rh: 7300

## 2019-03-21 ENCOUNTER — Inpatient Hospital Stay: Payer: Medicare Other

## 2019-03-21 ENCOUNTER — Encounter: Payer: Self-pay | Admitting: Internal Medicine

## 2019-03-21 ENCOUNTER — Other Ambulatory Visit: Payer: Self-pay | Admitting: *Deleted

## 2019-03-21 ENCOUNTER — Other Ambulatory Visit: Payer: Self-pay

## 2019-03-21 ENCOUNTER — Inpatient Hospital Stay: Payer: Medicare Other | Admitting: *Deleted

## 2019-03-21 VITALS — BP 120/63 | HR 75 | Temp 97.3°F | Resp 18

## 2019-03-21 DIAGNOSIS — C9200 Acute myeloblastic leukemia, not having achieved remission: Secondary | ICD-10-CM

## 2019-03-21 DIAGNOSIS — D469 Myelodysplastic syndrome, unspecified: Secondary | ICD-10-CM

## 2019-03-21 DIAGNOSIS — Z452 Encounter for adjustment and management of vascular access device: Secondary | ICD-10-CM

## 2019-03-21 LAB — CBC WITH DIFFERENTIAL/PLATELET
Abs Immature Granulocytes: 0.13 10*3/uL — ABNORMAL HIGH (ref 0.00–0.07)
Basophils Absolute: 0 10*3/uL (ref 0.0–0.1)
Basophils Relative: 0 %
Eosinophils Absolute: 0 10*3/uL (ref 0.0–0.5)
Eosinophils Relative: 0 %
HCT: 28 % — ABNORMAL LOW (ref 36.0–46.0)
Hemoglobin: 9.3 g/dL — ABNORMAL LOW (ref 12.0–15.0)
Immature Granulocytes: 10 %
Lymphocytes Relative: 31 %
Lymphs Abs: 0.4 10*3/uL — ABNORMAL LOW (ref 0.7–4.0)
MCH: 27.8 pg (ref 26.0–34.0)
MCHC: 33.2 g/dL (ref 30.0–36.0)
MCV: 83.8 fL (ref 80.0–100.0)
Monocytes Absolute: 0.2 10*3/uL (ref 0.1–1.0)
Monocytes Relative: 18 %
Neutro Abs: 0.5 10*3/uL — ABNORMAL LOW (ref 1.7–7.7)
Neutrophils Relative %: 41 %
Platelets: 12 10*3/uL — CL (ref 150–400)
RBC: 3.34 MIL/uL — ABNORMAL LOW (ref 3.87–5.11)
RDW: 18.4 % — ABNORMAL HIGH (ref 11.5–15.5)
Smear Review: DECREASED
WBC: 1.3 10*3/uL — CL (ref 4.0–10.5)
nRBC: 1.6 % — ABNORMAL HIGH (ref 0.0–0.2)

## 2019-03-21 LAB — SAMPLE TO BLOOD BANK

## 2019-03-21 MED ORDER — SODIUM CHLORIDE 0.9% FLUSH
10.0000 mL | INTRAVENOUS | Status: AC | PRN
  Administered 2019-03-21: 12:00:00 10 mL
  Filled 2019-03-21: qty 10

## 2019-03-21 MED ORDER — SODIUM CHLORIDE 0.9% FLUSH
10.0000 mL | Freq: Once | INTRAVENOUS | Status: AC
  Administered 2019-03-21: 09:00:00 10 mL via INTRAVENOUS
  Filled 2019-03-21: qty 10

## 2019-03-21 MED ORDER — SODIUM CHLORIDE 0.9% FLUSH
10.0000 mL | INTRAVENOUS | Status: AC | PRN
  Administered 2019-03-21: 10 mL
  Filled 2019-03-21: qty 10

## 2019-03-21 MED ORDER — HEPARIN SOD (PORK) LOCK FLUSH 100 UNIT/ML IV SOLN
250.0000 [IU] | INTRAVENOUS | Status: AC | PRN
  Administered 2019-03-21: 12:00:00 250 [IU]
  Filled 2019-03-21: qty 5

## 2019-03-21 MED ORDER — FILGRASTIM-AAFI 300 MCG/0.5ML IJ SOSY
300.0000 ug | PREFILLED_SYRINGE | Freq: Once | INTRAMUSCULAR | Status: AC
  Administered 2019-03-21: 300 ug via SUBCUTANEOUS
  Filled 2019-03-21: qty 0.5

## 2019-03-21 MED ORDER — SODIUM CHLORIDE 0.9% IV SOLUTION
250.0000 mL | Freq: Once | INTRAVENOUS | Status: AC
  Administered 2019-03-21: 250 mL via INTRAVENOUS
  Filled 2019-03-21: qty 250

## 2019-03-21 MED ORDER — DIPHENHYDRAMINE HCL 25 MG PO CAPS
25.0000 mg | ORAL_CAPSULE | Freq: Once | ORAL | Status: AC
  Administered 2019-03-21: 11:00:00 25 mg via ORAL
  Filled 2019-03-21: qty 1

## 2019-03-21 MED ORDER — ACETAMINOPHEN 325 MG PO TABS
650.0000 mg | ORAL_TABLET | Freq: Once | ORAL | Status: AC
  Administered 2019-03-21: 11:00:00 650 mg via ORAL
  Filled 2019-03-21: qty 2

## 2019-03-21 NOTE — Progress Notes (Signed)
Patient pre screened for office appointment, no questions or concerns today. 

## 2019-03-22 LAB — BPAM PLATELET PHERESIS
Blood Product Expiration Date: 202010122359
ISSUE DATE / TIME: 202010091115
Unit Type and Rh: 6200

## 2019-03-22 LAB — PREPARE PLATELET PHERESIS: Unit division: 0

## 2019-03-24 ENCOUNTER — Inpatient Hospital Stay: Payer: Medicare Other | Admitting: *Deleted

## 2019-03-24 ENCOUNTER — Inpatient Hospital Stay: Payer: Medicare Other

## 2019-03-24 ENCOUNTER — Other Ambulatory Visit: Payer: Self-pay

## 2019-03-24 ENCOUNTER — Other Ambulatory Visit: Payer: Self-pay | Admitting: *Deleted

## 2019-03-24 ENCOUNTER — Inpatient Hospital Stay (HOSPITAL_BASED_OUTPATIENT_CLINIC_OR_DEPARTMENT_OTHER): Payer: Medicare Other | Admitting: Internal Medicine

## 2019-03-24 VITALS — BP 105/66 | HR 68 | Temp 98.1°F | Resp 18

## 2019-03-24 DIAGNOSIS — C9200 Acute myeloblastic leukemia, not having achieved remission: Secondary | ICD-10-CM

## 2019-03-24 DIAGNOSIS — Z452 Encounter for adjustment and management of vascular access device: Secondary | ICD-10-CM

## 2019-03-24 DIAGNOSIS — D469 Myelodysplastic syndrome, unspecified: Secondary | ICD-10-CM

## 2019-03-24 DIAGNOSIS — C946 Myelodysplastic disease, not classified: Secondary | ICD-10-CM

## 2019-03-24 DIAGNOSIS — D649 Anemia, unspecified: Secondary | ICD-10-CM

## 2019-03-24 LAB — COMPREHENSIVE METABOLIC PANEL
ALT: 12 U/L (ref 0–44)
AST: 20 U/L (ref 15–41)
Albumin: 3.8 g/dL (ref 3.5–5.0)
Alkaline Phosphatase: 88 U/L (ref 38–126)
Anion gap: 8 (ref 5–15)
BUN: 22 mg/dL (ref 8–23)
CO2: 24 mmol/L (ref 22–32)
Calcium: 9 mg/dL (ref 8.9–10.3)
Chloride: 103 mmol/L (ref 98–111)
Creatinine, Ser: 0.41 mg/dL — ABNORMAL LOW (ref 0.44–1.00)
GFR calc Af Amer: >60 mL/min (ref >60)
GFR calc non Af Amer: >60 mL/min (ref >60)
Glucose, Bld: 141 mg/dL — ABNORMAL HIGH (ref 70–99)
Potassium: 3.9 mmol/L (ref 3.5–5.1)
Sodium: 135 mmol/L (ref 135–145)
Total Bilirubin: 0.7 mg/dL (ref 0.3–1.2)
Total Protein: 6.5 g/dL (ref 6.5–8.1)

## 2019-03-24 LAB — CBC WITH DIFFERENTIAL/PLATELET
Abs Immature Granulocytes: 0.23 10*3/uL — ABNORMAL HIGH (ref 0.00–0.07)
Basophils Absolute: 0 10*3/uL (ref 0.0–0.1)
Basophils Relative: 0 %
Eosinophils Absolute: 0 10*3/uL (ref 0.0–0.5)
Eosinophils Relative: 0 %
HCT: 27.4 % — ABNORMAL LOW (ref 36.0–46.0)
Hemoglobin: 8.9 g/dL — ABNORMAL LOW (ref 12.0–15.0)
Immature Granulocytes: 22 %
Lymphocytes Relative: 32 %
Lymphs Abs: 0.4 10*3/uL — ABNORMAL LOW (ref 0.7–4.0)
MCH: 27.5 pg (ref 26.0–34.0)
MCHC: 32.5 g/dL (ref 30.0–36.0)
MCV: 84.6 fL (ref 80.0–100.0)
Monocytes Absolute: 0.2 10*3/uL (ref 0.1–1.0)
Monocytes Relative: 17 %
Neutro Abs: 0.3 10*3/uL — ABNORMAL LOW (ref 1.7–7.7)
Neutrophils Relative %: 29 %
Platelets: 12 10*3/uL — CL (ref 150–400)
RBC: 3.24 MIL/uL — ABNORMAL LOW (ref 3.87–5.11)
RDW: 19 % — ABNORMAL HIGH (ref 11.5–15.5)
Smear Review: DECREASED
WBC: 1.1 10*3/uL — CL (ref 4.0–10.5)
nRBC: 1.9 % — ABNORMAL HIGH (ref 0.0–0.2)

## 2019-03-24 LAB — SAMPLE TO BLOOD BANK

## 2019-03-24 LAB — LACTATE DEHYDROGENASE: LDH: 285 U/L — ABNORMAL HIGH (ref 98–192)

## 2019-03-24 MED ORDER — SODIUM CHLORIDE 0.9% FLUSH
10.0000 mL | Freq: Once | INTRAVENOUS | Status: AC
  Administered 2019-03-24: 09:00:00 10 mL via INTRAVENOUS
  Filled 2019-03-24: qty 10

## 2019-03-24 MED ORDER — FILGRASTIM-AAFI 300 MCG/0.5ML IJ SOSY
300.0000 ug | PREFILLED_SYRINGE | Freq: Once | INTRAMUSCULAR | Status: AC
  Administered 2019-03-24: 13:00:00 300 ug via SUBCUTANEOUS
  Filled 2019-03-24: qty 0.5

## 2019-03-24 MED ORDER — DIPHENHYDRAMINE HCL 25 MG PO CAPS
25.0000 mg | ORAL_CAPSULE | Freq: Once | ORAL | Status: AC
  Administered 2019-03-24: 12:00:00 25 mg via ORAL
  Filled 2019-03-24: qty 1

## 2019-03-24 MED ORDER — SODIUM CHLORIDE 0.9% IV SOLUTION
250.0000 mL | Freq: Once | INTRAVENOUS | Status: AC
  Administered 2019-03-24: 10:00:00 250 mL via INTRAVENOUS
  Filled 2019-03-24: qty 250

## 2019-03-24 MED ORDER — ACETAMINOPHEN 325 MG PO TABS
650.0000 mg | ORAL_TABLET | Freq: Once | ORAL | Status: AC
  Administered 2019-03-24: 12:00:00 650 mg via ORAL
  Filled 2019-03-24: qty 2

## 2019-03-24 MED ORDER — HEPARIN SOD (PORK) LOCK FLUSH 100 UNIT/ML IV SOLN
500.0000 [IU] | Freq: Once | INTRAVENOUS | Status: AC
  Administered 2019-03-24: 13:00:00 250 [IU] via INTRAVENOUS

## 2019-03-24 NOTE — Assessment & Plan Note (Addendum)
#  Acute myeloid leukemia with monocytic differentiation-  Currently second line GILTERITINIB; currently on HOLD since 9/14. Currently on transfusion support.  Stable.  #Pancytopenia is likely secondary patient's chemotherapy/gilteritinib; rather than underlying malignancy.  Patient's LDH-270-280-/likely secondary to MDS/stable.   # Today -white count 0.6- ANC 300 hemoglobin 8.3  platelets 12  Continue to HOLD Gilteritinib 80 mg a day.  Proceed with 1 unit platelets transfusion/single donor platelets.  Also proceed with nivestim today goal ANC > 0.5.  Hold off blood transfusion.  #Easy bruising/buccal mucosal ecchymosis-however no active bleeding noted.  If worse would recommend Amicar oral rinse.  Stable  #Status post mechanical fall intracranial bleed-minor bleed no seizures.   Supportive care:  # Continue Levaquin/Noxafil at 100mg /acyclovir # PICC dressing weekly  #Discussed with patient's husband-at length regarding patient's plan of care/labs etc. Recommend continue  HOLDING  Gilteritinib.  Will likely plan to start at 1 pill [40 mg] a day with count recovery.   # DISPOSITION: # Today 1 unit platelets; Nivestim;no PRBC  # labs- cbc/hold tube on 10/15; possible UNITS platelet/1 UNIT PRBC Nivestim # follow up 10/19- cbc/cmp/LDH- HOLD tube/possible PRBC/ plaletets; nivestim Dr.B

## 2019-03-24 NOTE — Progress Notes (Signed)
[name] CONSULT NOTE  Patient Care Team: Rusty Aus, MD as PCP - General (Internal Medicine)  CHIEF COMPLAINTS/PURPOSE OF CONSULTATION: Acute myeloid leukemia   Oncology History Overview Note  # SEP 2017- MYELOPROLIFERATIVE NEOPLASM- [WBC- 38; normal Hb/platelets] hypercellular bone marrow 90-95% proliferation of myeloid cells in various stages of maturation; relative erythroid hypoplasia and proliferation of atypical megakaryocytes; no increase in blasts; peripheral blood Bcr-Abl-NEG; Cytogenetics- WNL. NEG- Jak-2/MPL/CALR [place] limited- mild splenomegaly [~10cm; 463cm3]; OCT 2017- second opinion at [facility]. Surveillance.   # With progressive leukocytosis we evaluated her a month ago and sent BM for exam. She has a progressive leukocytosis so hydrea '500mg'$  daily was started on 10/20/2016 and increased to 2gm daily then back down to one daily, then 5 days per week over time due to counts drop. Then we held her hydrea since 04/09/2018 due to continued declining Hb and Plt.s  BMB 06/24/18 showed markedly hypercellular BM 95% with myeloid hyperplasia and atypical megakaryocytic hyperplasia. No significant increase in blasts. 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 BCR / ABL negative, FISH normal. Flow Showed 2%CD34-positive myeloid blasts. Myeloid precursors with low side scatter. Pathogenic variants were detected in the ASXL1, CCND2, CUX1, and U2AF1 genes.  # March 2020- wbc- 14/ Hb 9.5/ platelets- 54.   ---------------------------------------------------   # June 8th 2020- Acute myeloid leukemia [peripheral blood flow cytometry;NGS- pending]- June 15th Vidaza [SQ 1-5 + Venatoclax- #1in pt]; tumor lysis prophylaxis  # Day #28 bone marrow biopsy-  7/14- BMx- NO BLASTS; positive for dysplastic changes. OFF Venatoclax [since 7/17] sec to cytopenias.  #817 cycle #2 Vidaza+ venetoclax 100 mg; stop venetoclax on 8/24  [severe pancytopenia]  # 02/18/2019-start gilteritinib 80 mg a day; HELD on 9/14 [sec to severe pan]  # AUG 2020- AST elevation- 248-?sec to Posonoazole; RESOLVED; continue at lower dose 100 mg/day.   # 2002 [[place]] BREAST CA s/p Lumpect RT; [? Stage I] no chemo s/p AI  # F-ONE HEM: FLT3 ALTERATION NOTED  DIAGNOSIS: ACUTE MYELOID LEUKEMIA GOALS: pallaitive  CURRENT/MOST RECENT THERAPY : Vidaza+ venotoclax.      MDS (myelodysplastic syndrome) ([place]) (Resolved)  08/28/2018 Initial Diagnosis   MDS (myelodysplastic syndrome) ([place])   Acute myeloid leukemia not having achieved remission ([place])  11/25/2018 Initial Diagnosis   AML (acute myeloid leukemia) with failed remission ([place])   11/25/2018 - 12/22/2018 Chemotherapy   The patient had azaCITIDine (VIDAZA) chemo injection 105 mg, 75 mg/m2 = 105 mg, Subcutaneous,  Once, 1 of 4 cycles Administration: 105 mg (11/25/2018), 105 mg (11/26/2018), 105 mg (11/27/2018), 105 mg (11/28/2018), 105 mg (11/29/2018)  for chemotherapy treatment.    01/27/2019 -  Chemotherapy   The patient had palonosetron (ALOXI) injection 0.25 mg, 0.25 mg, Intravenous,  Once, 1 of 4 cycles Administration: 0.25 mg (01/27/2019), 0.25 mg (01/29/2019), 0.25 mg (01/31/2019) azaCITIDine (VIDAZA) 100 mg in sodium chloride 0.9 % 50 mL chemo infusion, 75 mg/m2 = 100 mg, Intravenous, Once, 1 of 4 cycles Administration: 100 mg (01/27/2019), 100 mg (01/28/2019), 100 mg (01/29/2019), 100 mg (01/30/2019), 100 mg (01/31/2019)  for chemotherapy treatment.      HISTORY OF PRESENTING ILLNESS:  Kathryn Hahn 85 y.o.  female with acute myeloid leukemia with monocytic differentiation currently on surveillance/off any therapies because of severe pancytopenia from chemotherapy is here for follow-up.     Patient denies any blood in stools or black or stools.  Does admit to easy bruising.  No fevers or chills.  Continues to have  fatigue but overall improving.   Review of Systems  Constitutional:  Positive for malaise/fatigue. Negative for chills, diaphoresis, fever and weight loss.  HENT: Negative for nosebleeds and sore throat.   Eyes: Negative for double vision.  Respiratory: Positive for shortness of breath. Negative for cough, hemoptysis, sputum production and wheezing.   Cardiovascular: Positive for leg swelling. Negative for chest pain, palpitations and orthopnea.  Gastrointestinal: Negative for abdominal pain, blood in stool, constipation, diarrhea, heartburn, melena, nausea and vomiting.  Genitourinary: Negative for dysuria, frequency and urgency.  Musculoskeletal: Negative for back pain and joint pain.  Skin: Negative.  Negative for itching and rash.  Neurological: Negative for dizziness, tingling, focal weakness, weakness and headaches.  Endo/Heme/Allergies: Bruises/bleeds easily.  Psychiatric/Behavioral: Negative for depression. The patient is not nervous/anxious and does not have insomnia.     MEDICAL HISTORY:  Past Medical History:  Diagnosis Date  . Anemia   . Arthritis   . Breast cancer ([place]) 2003   RT LUMPECTOMY  . Edema leg   . History of breast cancer 2003   post lumpectomy  . History of kidney stones   . Hyperlipemia, mixed   . Hypertension, essential   . Leukemia ([place])   . Leukocytosis   . MDS (myelodysplastic syndrome) ([place])   . Personal history of radiation therapy 2003   BREAST CA  . Renal stones   . Vitamin D deficiency     SURGICAL HISTORY: Past Surgical History:  Procedure Laterality Date  . BREAST BIOPSY Right 2003   Postive for cancer  . BREAST LUMPECTOMY Right 2003   BREAST CA  . KIDNEY STONE SURGERY Right     SOCIAL HISTORY: Social History   Socioeconomic History  . Marital status: Married    Spouse name: Not on file  . Number of children: Not on file  . Years of education: Not on file  . Highest education level: Not on file  Occupational History  . Not on file  Social Needs  . Financial resource strain: Not on file  .  Food insecurity    Worry: Not on file    Inability: Not on file  . Transportation needs    Medical: Not on file    Non-medical: Not on file  Tobacco Use  . Smoking status: Never Smoker  . Smokeless tobacco: Never Used  Substance and Sexual Activity  . Alcohol use: Yes    Alcohol/week: 3.0 standard drinks    Types: 3 Glasses of wine per week  . Drug use: No  . Sexual activity: Not on file  Lifestyle  . Physical activity    Days per week: Not on file    Minutes per session: Not on file  . Stress: Not on file  Relationships  . Social Herbalist on phone: Not on file    Gets together: Not on file    Attends religious service: Not on file    Active member of club or organization: Not on file    Attends meetings of clubs or organizations: Not on file    Relationship status: Not on file  . Intimate partner violence    Fear of current or ex partner: Not on file    Emotionally abused: Not on file    Physically abused: Not on file    Forced sexual activity: Not on file  Other Topics Concern  . Not on file  Social History Narrative    No smoking/alcohol; lives in [place] with family.  She lives in assisted living with her husband.    FAMILY HISTORY: Family History  Problem Relation Age of Onset  . Stroke Mother   . Hypertension Father   . Stroke Father   . Breast cancer Neg Hx     ALLERGIES:  is allergic to terbinafine.  MEDICATIONS:  Current Outpatient Medications  Medication Sig Dispense Refill  . acetaminophen (TYLENOL) 325 MG tablet Take by mouth.    Marland Kitchen acyclovir (ZOVIRAX) 400 MG tablet ONE PILL A DAY [TO PREVENT SHINGLES] 90 tablet 1  . atenolol (TENORMIN) 50 MG tablet Take 1 tablet by mouth 2 (two) times daily.    . calcium carbonate (OSCAL) 1500 (600 Ca) MG TABS tablet Take 600 mg of elemental calcium by mouth daily with breakfast.    . Cholecalciferol (VITAMIN D) 2000 units tablet Take 1 tablet by mouth daily.    . Gilteritinib Fumarate (XOSPATA) 40  MG TABS Take 80 mg by mouth daily. 60 tablet 2  . heparin lock flush 100 UNIT/ML SOLN injection Inject into the vein.    . Infant Care Products (DERMACLOUD) CREA Apply topically.    Marland Kitchen levofloxacin (LEVAQUIN) 250 MG tablet Take 1 tablet (250 mg total) by mouth daily. 30 tablet 3  . losartan-hydrochlorothiazide (HYZAAR) 50-12.5 MG tablet Take 1 tablet by mouth daily.    . magic mouthwash w/lidocaine SOLN Take 5 mLs by mouth 4 (four) times daily as needed for mouth pain. 480 mL 3  . magnesium hydroxide (MILK OF MAGNESIA) 400 MG/5ML suspension Take 5 mLs by mouth every 4 (four) hours as needed.    . ondansetron (ZOFRAN) 8 MG tablet Take by mouth every 8 (eight) hours as needed for nausea or vomiting.    Marland Kitchen oxycodone (OXY-IR) 5 MG capsule Take 1 capsule (5 mg total) by mouth every 4 (four) hours as needed. 15 capsule 0  . polyethylene glycol (MIRALAX / GLYCOLAX) 17 g packet Take 17 g by mouth daily.    . posaconazole (NOXAFIL) 100 MG TBEC delayed-release tablet Take 2 tablets (200 mg total) by mouth daily. DO NOT START UNTIL-OK with MD. 60 tablet 3  . potassium chloride SA (K-DUR) 20 MEQ tablet Take 1 tablet (20 mEq total) by mouth 2 (two) times daily. 10 tablet 0  . senna (SENOKOT) 8.6 MG tablet Take 2 tablets by mouth 2 (two) times daily.     No current facility-administered medications for this visit.    Facility-Administered Medications Ordered in Other Visits  Medication Dose Route Frequency Provider Last Rate Last Dose  . 0.9 %  sodium chloride infusion   Intravenous Continuous Jacquelin Hawking, NP   Stopped at 02/05/19 1630      .  PHYSICAL EXAMINATION: ECOG PERFORMANCE STATUS: 0 - Asymptomatic  Vitals:   03/24/19 0919  BP: (!) 149/86  Pulse: 74  Temp: 97.6 F (36.4 C)   Filed Weights   03/24/19 0919  Weight: 92 lb (41.7 kg)    Physical Exam  Constitutional: She is oriented to person, place, and time and well-developed, well-nourished, and in no distress.  PICC line in  place.  No active bleeding.  HENT:  Head: Normocephalic and atraumatic.  Mouth/Throat: Oropharynx is clear and moist. No oropharyngeal exudate.  Eyes: Pupils are equal, round, and reactive to light.  Neck: Normal range of motion. Neck supple.  Cardiovascular: Normal rate and regular rhythm.  Pulmonary/Chest: Effort normal and breath sounds normal. No respiratory distress. She has no wheezes.  Abdominal: Soft. Bowel sounds are  normal. She exhibits no distension and no mass. There is no abdominal tenderness. There is no rebound and no guarding.  Musculoskeletal: Normal range of motion.        General: No tenderness or edema.  Neurological: She is alert and oriented to person, place, and time.  Skin: Skin is warm.  Multiple chronic bruises anterior torso extremities.  Psychiatric: Affect normal.    LABORATORY DATA:  I have reviewed the data as listed Lab Results  Component Value Date   WBC 1.1 (LL) 03/24/2019   HGB 8.9 (L) 03/24/2019   HCT 27.4 (L) 03/24/2019   MCV 84.6 03/24/2019   PLT 12 (LL) 03/24/2019   Recent Labs    03/10/19 0910 03/17/19 0843 03/24/19 0854  NA 133* 138 135  K 3.8 3.6 3.9  CL 103 101 103  CO2 _0 GLUCOSE 158* 176* 141*  BUN 20 25* 22  CREATININE 0.51 0.45 0.41*  CALCIUM 8.8* 8.9 9.0  GFRNONAA >60 >60 >60  GFRAA >60 >60 >60  PROT 6.1* 6.2* 6.5  ALBUMIN 3.6 3.7 3.8  AST _1 ALT _2 ALKPHOS 92 89 88  BILITOT 0.9 0.9 0.7    RADIOGRAPHIC STUDIES: I have personally reviewed the radiological images as listed and agreed with the findings in the report. No results found.  ASSESSMENT & PLAN:   Acute myeloid leukemia not having achieved remission ([place]) #Acute myeloid leukemia with monocytic differentiation-  Currently second line GILTERITINIB; currently on HOLD since 9/14. Currently on transfusion support.  Stable.  #Pancytopenia is likely secondary patient's chemotherapy/gilteritinib; rather than underlying malignancy.  Patient's  LDH-270-280-/likely secondary to MDS/stable.   # Today -white count 0.6- ANC 300 hemoglobin 8.3  platelets 12  Continue to HOLD Gilteritinib 80 mg a day.  Proceed with 1 unit platelets transfusion/single donor platelets.  Also proceed with nivestim today goal ANC > 0.5.  Hold off blood transfusion.  #Easy bruising/buccal mucosal ecchymosis-however no active bleeding noted.  If worse would recommend Amicar oral rinse.  Stable  #Status post mechanical fall intracranial bleed-minor bleed no seizures.   Supportive care:  # Continue Levaquin/Noxafil at 157m/acyclovir # PICC dressing weekly  #Discussed with patient's husband-at length regarding patient's plan of care/labs etc. Recommend continue  HOLDING  Gilteritinib.  Will likely plan to start at 1 pill [40 mg] a day with count recovery.   # DISPOSITION: # Today 1 unit platelets; Nivestim;no PRBC  # labs- cbc/hold tube on 10/15; possible UNITS platelet/1 UNIT PRBC Nivestim # follow up 10/19- cbc/cmp/LDH- HOLD tube/possible PRBC/ plaletets; nivestim Dr.B    GCammie Sickle MD 03/24/2019 1:06 PM

## 2019-03-25 LAB — BPAM PLATELET PHERESIS
Blood Product Expiration Date: 202010152359
ISSUE DATE / TIME: 202010121159
Unit Type and Rh: 5100

## 2019-03-25 LAB — PREPARE PLATELET PHERESIS: Unit division: 0

## 2019-03-26 ENCOUNTER — Other Ambulatory Visit: Payer: Self-pay

## 2019-03-27 ENCOUNTER — Other Ambulatory Visit: Payer: Self-pay

## 2019-03-27 ENCOUNTER — Inpatient Hospital Stay: Payer: Medicare Other | Admitting: *Deleted

## 2019-03-27 ENCOUNTER — Other Ambulatory Visit: Payer: Self-pay | Admitting: *Deleted

## 2019-03-27 ENCOUNTER — Inpatient Hospital Stay: Payer: Medicare Other

## 2019-03-27 VITALS — BP 122/66 | HR 68 | Temp 97.2°F | Resp 18

## 2019-03-27 DIAGNOSIS — C9200 Acute myeloblastic leukemia, not having achieved remission: Secondary | ICD-10-CM

## 2019-03-27 DIAGNOSIS — D469 Myelodysplastic syndrome, unspecified: Secondary | ICD-10-CM

## 2019-03-27 DIAGNOSIS — Z95828 Presence of other vascular implants and grafts: Secondary | ICD-10-CM

## 2019-03-27 LAB — CBC WITH DIFFERENTIAL/PLATELET
Abs Immature Granulocytes: 0 10*3/uL (ref 0.00–0.07)
Basophils Absolute: 0 10*3/uL (ref 0.0–0.1)
Basophils Relative: 0 %
Eosinophils Absolute: 0.1 10*3/uL (ref 0.0–0.5)
Eosinophils Relative: 9 %
HCT: 26.4 % — ABNORMAL LOW (ref 36.0–46.0)
Hemoglobin: 8.7 g/dL — ABNORMAL LOW (ref 12.0–15.0)
Lymphocytes Relative: 31 %
Lymphs Abs: 0.5 10*3/uL — ABNORMAL LOW (ref 0.7–4.0)
MCH: 27.8 pg (ref 26.0–34.0)
MCHC: 33 g/dL (ref 30.0–36.0)
MCV: 84.3 fL (ref 80.0–100.0)
Monocytes Absolute: 0.2 10*3/uL (ref 0.1–1.0)
Monocytes Relative: 14 %
Myelocytes: 3 %
Neutro Abs: 0.7 10*3/uL — ABNORMAL LOW (ref 1.7–7.7)
Neutrophils Relative %: 43 %
Platelets: 14 10*3/uL — CL (ref 150–400)
RBC: 3.13 MIL/uL — ABNORMAL LOW (ref 3.87–5.11)
RDW: 19.3 % — ABNORMAL HIGH (ref 11.5–15.5)
Smear Review: DECREASED
WBC: 1.6 10*3/uL — ABNORMAL LOW (ref 4.0–10.5)
nRBC: 1.3 % — ABNORMAL HIGH (ref 0.0–0.2)

## 2019-03-27 LAB — SAMPLE TO BLOOD BANK

## 2019-03-27 MED ORDER — SODIUM CHLORIDE 0.9% IV SOLUTION
250.0000 mL | Freq: Once | INTRAVENOUS | Status: AC
  Administered 2019-03-27: 250 mL via INTRAVENOUS
  Filled 2019-03-27: qty 250

## 2019-03-27 MED ORDER — DIPHENHYDRAMINE HCL 25 MG PO CAPS
25.0000 mg | ORAL_CAPSULE | Freq: Once | ORAL | Status: AC
  Administered 2019-03-27: 25 mg via ORAL
  Filled 2019-03-27: qty 1

## 2019-03-27 MED ORDER — FILGRASTIM-AAFI 300 MCG/0.5ML IJ SOSY
300.0000 ug | PREFILLED_SYRINGE | Freq: Once | INTRAMUSCULAR | Status: AC
  Administered 2019-03-27: 12:00:00 300 ug via SUBCUTANEOUS
  Filled 2019-03-27: qty 0.5

## 2019-03-27 MED ORDER — SODIUM CHLORIDE 0.9% FLUSH
10.0000 mL | Freq: Once | INTRAVENOUS | Status: AC
  Administered 2019-03-27: 10 mL via INTRAVENOUS
  Filled 2019-03-27: qty 10

## 2019-03-27 MED ORDER — ACETAMINOPHEN 325 MG PO TABS
650.0000 mg | ORAL_TABLET | Freq: Once | ORAL | Status: AC
  Administered 2019-03-27: 11:00:00 650 mg via ORAL
  Filled 2019-03-27: qty 2

## 2019-03-28 LAB — PREPARE PLATELET PHERESIS: Unit division: 0

## 2019-03-28 LAB — BPAM PLATELET PHERESIS
Blood Product Expiration Date: 202010172359
ISSUE DATE / TIME: 202010151115
Unit Type and Rh: 5100

## 2019-03-31 ENCOUNTER — Inpatient Hospital Stay: Payer: Medicare Other | Admitting: *Deleted

## 2019-03-31 ENCOUNTER — Other Ambulatory Visit: Payer: Self-pay | Admitting: *Deleted

## 2019-03-31 ENCOUNTER — Encounter: Payer: Self-pay | Admitting: Nurse Practitioner

## 2019-03-31 ENCOUNTER — Inpatient Hospital Stay (HOSPITAL_BASED_OUTPATIENT_CLINIC_OR_DEPARTMENT_OTHER): Payer: Medicare Other | Admitting: Nurse Practitioner

## 2019-03-31 ENCOUNTER — Inpatient Hospital Stay: Payer: Medicare Other

## 2019-03-31 ENCOUNTER — Other Ambulatory Visit: Payer: Self-pay

## 2019-03-31 VITALS — BP 125/73 | HR 69 | Temp 97.4°F | Resp 18 | Wt 90.8 lb

## 2019-03-31 VITALS — BP 107/66 | HR 61 | Temp 95.8°F | Resp 18

## 2019-03-31 DIAGNOSIS — C9200 Acute myeloblastic leukemia, not having achieved remission: Secondary | ICD-10-CM | POA: Diagnosis not present

## 2019-03-31 DIAGNOSIS — Z95828 Presence of other vascular implants and grafts: Secondary | ICD-10-CM

## 2019-03-31 DIAGNOSIS — D469 Myelodysplastic syndrome, unspecified: Secondary | ICD-10-CM

## 2019-03-31 LAB — CBC WITH DIFFERENTIAL/PLATELET
Abs Immature Granulocytes: 0.12 10*3/uL — ABNORMAL HIGH (ref 0.00–0.07)
Basophils Absolute: 0 10*3/uL (ref 0.0–0.1)
Basophils Relative: 0 %
Eosinophils Absolute: 0 10*3/uL (ref 0.0–0.5)
Eosinophils Relative: 0 %
HCT: 26 % — ABNORMAL LOW (ref 36.0–46.0)
Hemoglobin: 8.5 g/dL — ABNORMAL LOW (ref 12.0–15.0)
Immature Granulocytes: 12 %
Lymphocytes Relative: 39 %
Lymphs Abs: 0.4 10*3/uL — ABNORMAL LOW (ref 0.7–4.0)
MCH: 27.6 pg (ref 26.0–34.0)
MCHC: 32.7 g/dL (ref 30.0–36.0)
MCV: 84.4 fL (ref 80.0–100.0)
Monocytes Absolute: 0.3 10*3/uL (ref 0.1–1.0)
Monocytes Relative: 25 %
Neutro Abs: 0.3 10*3/uL — ABNORMAL LOW (ref 1.7–7.7)
Neutrophils Relative %: 24 %
Platelets: 11 10*3/uL — CL (ref 150–400)
RBC: 3.08 MIL/uL — ABNORMAL LOW (ref 3.87–5.11)
RDW: 19.3 % — ABNORMAL HIGH (ref 11.5–15.5)
Smear Review: NORMAL
WBC: 1 10*3/uL — CL (ref 4.0–10.5)
nRBC: 1.9 % — ABNORMAL HIGH (ref 0.0–0.2)

## 2019-03-31 LAB — COMPREHENSIVE METABOLIC PANEL
ALT: 13 U/L (ref 0–44)
AST: 19 U/L (ref 15–41)
Albumin: 3.7 g/dL (ref 3.5–5.0)
Alkaline Phosphatase: 79 U/L (ref 38–126)
Anion gap: 9 (ref 5–15)
BUN: 26 mg/dL — ABNORMAL HIGH (ref 8–23)
CO2: 25 mmol/L (ref 22–32)
Calcium: 9.3 mg/dL (ref 8.9–10.3)
Chloride: 102 mmol/L (ref 98–111)
Creatinine, Ser: 0.47 mg/dL (ref 0.44–1.00)
GFR calc Af Amer: >60 mL/min (ref >60)
GFR calc non Af Amer: >60 mL/min (ref >60)
Glucose, Bld: 119 mg/dL — ABNORMAL HIGH (ref 70–99)
Potassium: 4.5 mmol/L (ref 3.5–5.1)
Sodium: 136 mmol/L (ref 135–145)
Total Bilirubin: 0.7 mg/dL (ref 0.3–1.2)
Total Protein: 6.5 g/dL (ref 6.5–8.1)

## 2019-03-31 LAB — SAMPLE TO BLOOD BANK

## 2019-03-31 LAB — LACTATE DEHYDROGENASE: LDH: 270 U/L — ABNORMAL HIGH (ref 98–192)

## 2019-03-31 MED ORDER — SODIUM CHLORIDE 0.9% IV SOLUTION
250.0000 mL | Freq: Once | INTRAVENOUS | Status: AC
  Administered 2019-03-31: 250 mL via INTRAVENOUS
  Filled 2019-03-31: qty 250

## 2019-03-31 MED ORDER — HEPARIN SOD (PORK) LOCK FLUSH 100 UNIT/ML IV SOLN
INTRAVENOUS | Status: AC
  Filled 2019-03-31: qty 5

## 2019-03-31 MED ORDER — DIPHENHYDRAMINE HCL 25 MG PO CAPS
25.0000 mg | ORAL_CAPSULE | Freq: Once | ORAL | Status: AC
  Administered 2019-03-31: 25 mg via ORAL
  Filled 2019-03-31: qty 1

## 2019-03-31 MED ORDER — HEPARIN SOD (PORK) LOCK FLUSH 100 UNIT/ML IV SOLN
500.0000 [IU] | Freq: Once | INTRAVENOUS | Status: AC
  Administered 2019-03-31: 500 [IU] via INTRAVENOUS
  Filled 2019-03-31: qty 5

## 2019-03-31 MED ORDER — SODIUM CHLORIDE 0.9% FLUSH
10.0000 mL | Freq: Once | INTRAVENOUS | Status: AC
  Administered 2019-03-31: 09:00:00 10 mL via INTRAVENOUS
  Filled 2019-03-31: qty 10

## 2019-03-31 MED ORDER — FILGRASTIM-AAFI 300 MCG/0.5ML IJ SOSY
300.0000 ug | PREFILLED_SYRINGE | Freq: Once | INTRAMUSCULAR | Status: AC
  Administered 2019-03-31: 10:00:00 300 ug via SUBCUTANEOUS
  Filled 2019-03-31: qty 0.5

## 2019-03-31 MED ORDER — ACETAMINOPHEN 325 MG PO TABS
650.0000 mg | ORAL_TABLET | Freq: Once | ORAL | Status: AC
  Administered 2019-03-31: 650 mg via ORAL
  Filled 2019-03-31: qty 2

## 2019-03-31 NOTE — Assessment & Plan Note (Addendum)
#  Acute myeloid leukemia with monocytic differentiation-currently second line GILTERITNIB-on hold since 9/14.  Currently on transfusion support.  Stable.  #Pancytopenia-likely secondary to chemotherapy/gilteritnib rather than underlying malignancy. LDH 270-280- likely secondary to MDS-stable.   # Today's labs reviewed with patient in detail-white count 1.0 with ANC 300; stable. Hemoglobin 8.5, platelets 11. Continue to HOLD Gilteritinib 80 mg daily. Will consider starting back at 1 pill (40 mg) a day once counts recover. Proceed with 1 unit platelet transfusion/single donor platelets- irradiated. Also proceed with nivestym today with goal ANC > 0.5. No blood transfusion today- goal hemoglobin > 8.0.   #Easy bruising/buccal mucosal ecchymosis-no reports of active bleeding.  Buccal ecchymosis stable.  Continue using soft toothbrush/swab for oral hygiene. If worsens, consider Amicar oral rinse.   # s/p mechanical fall with intracranial bleed-monitor bleed. No seizures. No recent falls. Continue to monitor.   #Supportive Care:  -Continue Levaquin/Noxafil 100mg  and acyclovir - Continue PICC line dressing changes weekly  Disposition: # Transfuse 1 unit of platelets today, Nivestym, No pRBCs # 10/21- labs (cbc, hold tube), possible platelets, Nivestym #10/23- labs (cbc, hold tube), possible platelets &/or pRBCs, Nivestym 1 week- lab (CBC, hold tube), possible platelets, nivestym, NP 10/28-  labs (cbc, hold tube), possible platelets, Nivestym 10/30- labs (cbc, hold tube), possible platelets &/or pRBCs, Nivestym # 2 weeks- labs (cbc, cmp, ldh, hold tube), evaluation with Dr. Rogue Bussing and possible pRBCs/Platelets, Faylene Kurtz

## 2019-03-31 NOTE — Progress Notes (Signed)
[facility] OFFICE PROGRESS NOTE  Patient Care Team: Rusty Aus, MD as PCP - General (Internal Medicine)  CHIEF COMPLAINTS/PURPOSE OF CONSULTATION: Acute myeloid leukemia   Oncology History Overview Note  # SEP 2017- MYELOPROLIFERATIVE NEOPLASM- [WBC- 85; normal Hb/platelets] hypercellular bone marrow 90-95% proliferation of myeloid cells in various stages of maturation; relative erythroid hypoplasia and proliferation of atypical megakaryocytes; no increase in blasts; peripheral blood Bcr-Abl-NEG; Cytogenetics- WNL. NEG- Jak-2/MPL/CALR [place] limited- mild splenomegaly [~10cm; 463cm3]; OCT 2017- second opinion at [place]. Surveillance.   # With progressive leukocytosis we evaluated her a month ago and sent BM for exam. She has a progressive leukocytosis so hydrea 530m daily was started on 10/20/2016 and increased to 2gm daily then back down to one daily, then 5 days per week over time due to counts drop. Then we held her hydrea since 04/09/2018 due to continued declining Hb and Plt.s  BMB 06/24/18 showed markedly hypercellular BM 95% with myeloid hyperplasia and atypical megakaryocytic hyperplasia. No significant increase in blasts. 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 BCR / ABL negative, FISH normal. Flow Showed 2%CD34-positive myeloid blasts. Myeloid precursors with low side scatter. Pathogenic variants were detected in the ASXL1, CCND2, CUX1, and U2AF1 genes.  # March 2020- wbc- 14/ Hb 9.5/ platelets- 54.   ---------------------------------------------------   # June 8th 2020- Acute myeloid leukemia [peripheral blood flow cytometry;NGS- pending]- June 15th Vidaza [SQ 1-5 + Venatoclax- #1in pt]; tumor lysis prophylaxis  # Day #28 bone marrow biopsy-  7/14- BMx- NO BLASTS; positive for dysplastic changes. OFF Venatoclax [since 7/17] sec to cytopenias.  #817 cycle #2 Vidaza+ venetoclax 100 mg; stop venetoclax on  8/24 [severe pancytopenia]  # 02/18/2019-start gilteritinib 80 mg a day; HELD on 9/14 [sec to severe pan]  # AUG 2020- AST elevation- 248-?sec to Posonoazole; RESOLVED; continue at lower dose 100 mg/day.   # 2002 [[place]] BREAST CA s/p Lumpect RT; [? Stage I] no chemo s/p AI  # F-ONE HEM: FLT3 ALTERATION NOTED  DIAGNOSIS: ACUTE MYELOID LEUKEMIA GOALS: pallaitive  CURRENT/MOST RECENT THERAPY : Vidaza+ venotoclax.      MDS (myelodysplastic syndrome) ([facility] [place]  08/28/2018 Initial Diagnosis   MDS (myelodysplastic syndrome) ([facility]   Acute myeloid leukemia not having achieved remission ([facility]  11/25/2018 Initial Diagnosis   AML (acute myeloid leukemia) with failed remission [place]   11/25/2018 - 12/22/2018 Chemotherapy   The patient had azaCITIDine (VIDAZA) chemo injection 105 mg, 75 mg/m2 = 105 mg, Subcutaneous,  Once, 1 of 4 cycles Administration: 105 mg (11/25/2018), 105 mg (11/26/2018), 105 mg (11/27/2018), 105 mg (11/28/2018), 105 mg (11/29/2018)  for chemotherapy treatment.    01/27/2019 -  Chemotherapy   The patient had palonosetron (ALOXI) injection 0.25 mg, 0.25 mg, Intravenous,  Once, 1 of 4 cycles Administration: 0.25 mg (01/27/2019), 0.25 mg (01/29/2019), 0.25 mg (01/31/2019) azaCITIDine (VIDAZA) 100 mg in sodium chloride 0.9 % 50 mL chemo infusion, 75 mg/m2 = 100 mg, Intravenous, Once, 1 of 4 cycles Administration: 100 mg (01/27/2019), 100 mg (01/28/2019), 100 mg (01/29/2019), 100 mg (01/30/2019), 100 mg (01/31/2019)  for chemotherapy treatment.      HISTORY OF PRESENTING ILLNESS:  Kathryn Hahn 85y.o.  female with acute myeloid leukemia with monocytic differentiation, currently on surveillance/off any therapies due to severe pancytopenia from chemotherapy, who returns to clinic today for follow-up.  She was last seen by Dr.Brahmanday on 03/24/2019.  She has been on second line Gilteritinib which has been held since 9/14 due to  pancytopenia. In the interim, she has continued  to receive platelet transfusions and nivestim.  Today, she says that fatigue has improved.  She continues to have easy bruising.  Some intermittent, bleeding. Uses Amicar intermittently.  No blood in stools or black stools.  No fever or chills.  No dizziness or recent falls.     Review of Systems  Constitutional: Positive for malaise/fatigue (Improved). Negative for chills, diaphoresis, fever and weight loss.  HENT: Negative for nosebleeds and sore throat.   Eyes: Negative for double vision.  Respiratory: Positive for shortness of breath (Stable). Negative for cough, hemoptysis, sputum production and wheezing.   Cardiovascular: Positive for leg swelling (Stable). Negative for chest pain, palpitations and orthopnea.  Gastrointestinal: Negative for abdominal pain, blood in stool, constipation, diarrhea, heartburn, melena, nausea and vomiting.  Genitourinary: Negative for dysuria, frequency and urgency.  Musculoskeletal: Negative for back pain and joint pain.  Skin: Negative.  Negative for itching and rash.  Neurological: Negative for dizziness, tingling, focal weakness, weakness and headaches.  Endo/Heme/Allergies: Bruises/bleeds easily (Improved).  Psychiatric/Behavioral: Negative for depression. The patient is not nervous/anxious and does not have insomnia.     MEDICAL HISTORY:  Past Medical History:  Diagnosis Date  . Anemia   . Arthritis   . Breast cancer ([place]) 2003   RT LUMPECTOMY  . Edema leg   . History of breast cancer 2003   post lumpectomy  . History of kidney stones   . Hyperlipemia, mixed   . Hypertension, essential   . Leukemia ([place])   . Leukocytosis   . MDS (myelodysplastic syndrome) ([place])   . Personal history of radiation therapy 2003   BREAST CA  . Renal stones   . Vitamin D deficiency     SURGICAL HISTORY: Past Surgical History:  Procedure Laterality Date  . BREAST BIOPSY Right 2003   Postive for cancer  . BREAST LUMPECTOMY Right 2003   BREAST CA  .  KIDNEY STONE SURGERY Right     SOCIAL HISTORY: Social History   Socioeconomic History  . Marital status: Married    Spouse name: Not on file  . Number of children: Not on file  . Years of education: Not on file  . Highest education level: Not on file  Occupational History  . Not on file  Social Needs  . Financial resource strain: Not on file  . Food insecurity    Worry: Not on file    Inability: Not on file  . Transportation needs    Medical: Not on file    Non-medical: Not on file  Tobacco Use  . Smoking status: Never Smoker  . Smokeless tobacco: Never Used  Substance and Sexual Activity  . Alcohol use: Yes    Alcohol/week: 3.0 standard drinks    Types: 3 Glasses of wine per week  . Drug use: No  . Sexual activity: Not on file  Lifestyle  . Physical activity    Days per week: Not on file    Minutes per session: Not on file  . Stress: Not on file  Relationships  . Social Herbalist on phone: Not on file    Gets together: Not on file    Attends religious service: Not on file    Active member of club or organization: Not on file    Attends meetings of clubs or organizations: Not on file    Relationship status: Not on file  . Intimate partner violence    Fear of  current or ex partner: Not on file    Emotionally abused: Not on file    Physically abused: Not on file    Forced sexual activity: Not on file  Other Topics Concern  . Not on file  Social History Narrative    No smoking/alcohol; lives in [place] with family.  She lives in assisted living with her husband.    FAMILY HISTORY: Family History  Problem Relation Age of Onset  . Stroke Mother   . Hypertension Father   . Stroke Father   . Breast cancer Neg Hx     ALLERGIES:  is allergic to terbinafine.  MEDICATIONS:  Current Outpatient Medications  Medication Sig Dispense Refill  . acetaminophen (TYLENOL) 325 MG tablet Take by mouth.    Marland Kitchen acyclovir (ZOVIRAX) 400 MG tablet ONE PILL A DAY  [TO PREVENT SHINGLES] 90 tablet 1  . atenolol (TENORMIN) 50 MG tablet Take 1 tablet by mouth 2 (two) times daily.    . calcium carbonate (OSCAL) 1500 (600 Ca) MG TABS tablet Take 600 mg of elemental calcium by mouth daily with breakfast.    . Cholecalciferol (VITAMIN D) 2000 units tablet Take 1 tablet by mouth daily.    . Gilteritinib Fumarate (XOSPATA) 40 MG TABS Take 80 mg by mouth daily. 60 tablet 2  . heparin lock flush 100 UNIT/ML SOLN injection Inject into the vein.    . Infant Care Products (DERMACLOUD) CREA Apply topically.    Marland Kitchen levofloxacin (LEVAQUIN) 250 MG tablet Take 1 tablet (250 mg total) by mouth daily. 30 tablet 3  . losartan-hydrochlorothiazide (HYZAAR) 50-12.5 MG tablet Take 1 tablet by mouth daily.    . magic mouthwash w/lidocaine SOLN Take 5 mLs by mouth 4 (four) times daily as needed for mouth pain. 480 mL 3  . magnesium hydroxide (MILK OF MAGNESIA) 400 MG/5ML suspension Take 5 mLs by mouth every 4 (four) hours as needed.    . ondansetron (ZOFRAN) 8 MG tablet Take by mouth every 8 (eight) hours as needed for nausea or vomiting.    Marland Kitchen oxycodone (OXY-IR) 5 MG capsule Take 1 capsule (5 mg total) by mouth every 4 (four) hours as needed. 15 capsule 0  . polyethylene glycol (MIRALAX / GLYCOLAX) 17 g packet Take 17 g by mouth daily.    . posaconazole (NOXAFIL) 100 MG TBEC delayed-release tablet Take 2 tablets (200 mg total) by mouth daily. DO NOT START UNTIL-OK with MD. 60 tablet 3  . potassium chloride SA (K-DUR) 20 MEQ tablet Take 1 tablet (20 mEq total) by mouth 2 (two) times daily. 10 tablet 0  . senna (SENOKOT) 8.6 MG tablet Take 2 tablets by mouth 2 (two) times daily.     No current facility-administered medications for this visit.    Facility-Administered Medications Ordered in Other Visits  Medication Dose Route Frequency Provider Last Rate Last Dose  . 0.9 %  sodium chloride infusion   Intravenous Continuous Jacquelin Hawking, NP   Stopped at 02/05/19 1630    PHYSICAL  EXAMINATION: ECOG PERFORMANCE STATUS: 1 - Symptomatic but completely ambulatory  Vitals:   03/31/19 0900  BP: 125/73  Pulse: 69  Resp: 18  Temp: (!) 97.4 F (36.3 C)  SpO2: 100%   Filed Weights   03/31/19 0900  Weight: 90 lb 12.8 oz (41.2 kg)    Physical Exam  Constitutional: She is oriented to person, place, and time and well-developed, well-nourished, and in no distress.  PICC line in place.  No active bleeding.  Masked  HENT:  Head: Normocephalic and atraumatic.  Mouth/Throat: Oropharynx is clear and moist. No oropharyngeal exudate.  Eyes: Conjunctivae are normal. No scleral icterus.  Neck: Normal range of motion. Neck supple.  Cardiovascular: Normal rate and regular rhythm.  Pulmonary/Chest: Effort normal. No respiratory distress. She has wheezes (RLL worse than RUL).  Abdominal: Soft. Bowel sounds are normal. She exhibits no distension and no mass. There is no abdominal tenderness. There is no rebound and no guarding.  Musculoskeletal: Normal range of motion.        General: No tenderness or edema.     Comments: Sitting in wheelchair  Neurological: She is alert and oriented to person, place, and time.  Skin: Skin is warm.  Multiple bruises on lower legs, few on hands and arms. Torso bruising improved/mostly resolved  Psychiatric: Affect normal.    LABORATORY DATA:  I have reviewed the data as listed Lab Results  Component Value Date   WBC 1.0 (LL) 03/31/2019   HGB 8.5 (L) 03/31/2019   HCT 26.0 (L) 03/31/2019   MCV 84.4 03/31/2019   PLT PENDING 03/31/2019   Recent Labs    03/17/19 0843 03/24/19 0854 03/31/19 0830  NA 138 135 136  K 3.6 3.9 4.5  CL 101 103 102  CO2 _0 GLUCOSE 176* 141* 119*  BUN 25* 22 26*  CREATININE 0.45 0.41* 0.47  CALCIUM 8.9 9.0 9.3  GFRNONAA >60 >60 >60  GFRAA >60 >60 >60  PROT 6.2* 6.5 6.5  ALBUMIN 3.7 3.8 3.7  AST _1 ALT _2 ALKPHOS 89 88 79  BILITOT 0.9 0.7 0.7    RADIOGRAPHIC STUDIES: I have  personally reviewed the radiological images as listed and agreed with the findings in the report. No results found.  ASSESSMENT & PLAN:   Acute myeloid leukemia not having achieved remission ([place]) #Acute myeloid leukemia with monocytic differentiation-currently second line GILTERITNIB-on hold since 9/14.  Currently on transfusion support.  Stable.  #Pancytopenia-likely secondary to chemotherapy/gilteritnib rather than underlying malignancy. LDH 270-280- likely secondary to MDS-stable.   # Today's labs reviewed with patient in detail-white count 1.0 with ANC 300; stable. Hemoglobin 8.5, platelets 11. Continue to HOLD Gilteritinib 80 mg daily. Will consider starting back at 1 pill (40 mg) a day once counts recover. Proceed with 1 unit platelet transfusion/single donor platelets- irradiated. Also proceed with nivestym today with goal ANC > 0.5. No blood transfusion today- goal hemoglobin > 8.0.   #Easy bruising/buccal mucosal ecchymosis-no reports of active bleeding.  Buccal ecchymosis stable.  Continue using soft toothbrush/swab for oral hygiene. If worsens, consider Amicar oral rinse.   # s/p mechanical fall with intracranial bleed-monitor bleed. No seizures. No recent falls. Continue to monitor.   #Supportive Care:  -Continue Levaquin/Noxafil 131m and acyclovir - Continue PICC line dressing changes weekly  Disposition: # Transfuse 1 unit of platelets today, Nivestym, No pRBCs # 10/21- labs (cbc, hold tube), possible platelets, Nivestym #10/23- labs (cbc, hold tube), possible platelets &/or pRBCs, Nivestym 1 week- lab (CBC, hold tube), possible platelets, nivestym, NP 10/28-  labs (cbc, hold tube), possible platelets, Nivestym 10/30- labs (cbc, hold tube), possible platelets &/or pRBCs, Nivestym # 2 weeks- labs (cbc, cmp, ldh, hold tube), evaluation with Dr. BRogue Bussingand possible pRBCs/Platelets, NRaynelle Chary DNP, AGNP-C CDunkirkat [place] (work cell) [phone](office)   CC: Dr. BRogue Bussing

## 2019-04-01 LAB — PREPARE PLATELET PHERESIS: Unit division: 0

## 2019-04-01 LAB — BPAM PLATELET PHERESIS
Blood Product Expiration Date: 202010222359
ISSUE DATE / TIME: 202010191249
Unit Type and Rh: 5100

## 2019-04-02 ENCOUNTER — Inpatient Hospital Stay: Payer: Medicare Other | Admitting: *Deleted

## 2019-04-02 ENCOUNTER — Other Ambulatory Visit: Payer: Self-pay

## 2019-04-02 ENCOUNTER — Ambulatory Visit
Admission: RE | Admit: 2019-04-02 | Discharge: 2019-04-02 | Disposition: A | Discharge status: Home / Self Care | Payer: Medicare Other | Source: Ambulatory Visit | Attending: Nurse Practitioner | Admitting: Nurse Practitioner

## 2019-04-02 ENCOUNTER — Inpatient Hospital Stay: Payer: Medicare Other

## 2019-04-02 ENCOUNTER — Ambulatory Visit
Admission: RE | Admit: 2019-04-02 | Discharge: 2019-04-02 | Disposition: A | Discharge status: Home / Self Care | Payer: Medicare Other | Attending: Nurse Practitioner | Admitting: Nurse Practitioner

## 2019-04-02 VITALS — BP 109/68 | HR 71 | Temp 97.4°F | Resp 17

## 2019-04-02 DIAGNOSIS — Z95828 Presence of other vascular implants and grafts: Secondary | ICD-10-CM

## 2019-04-02 DIAGNOSIS — C9200 Acute myeloblastic leukemia, not having achieved remission: Secondary | ICD-10-CM | POA: Diagnosis present

## 2019-04-02 DIAGNOSIS — Z452 Encounter for adjustment and management of vascular access device: Secondary | ICD-10-CM

## 2019-04-02 LAB — CBC WITH DIFFERENTIAL/PLATELET
Abs Immature Granulocytes: 0.1 10*3/uL — ABNORMAL HIGH (ref 0.00–0.07)
Band Neutrophils: 4 %
Basophils Absolute: 0 10*3/uL (ref 0.0–0.1)
Basophils Relative: 0 %
Eosinophils Absolute: 0 10*3/uL (ref 0.0–0.5)
Eosinophils Relative: 1 %
HCT: 25.3 % — ABNORMAL LOW (ref 36.0–46.0)
Hemoglobin: 8.2 g/dL — ABNORMAL LOW (ref 12.0–15.0)
Lymphocytes Relative: 35 %
Lymphs Abs: 0.7 10*3/uL (ref 0.7–4.0)
MCH: 27.8 pg (ref 26.0–34.0)
MCHC: 32.4 g/dL (ref 30.0–36.0)
MCV: 85.8 fL (ref 80.0–100.0)
Metamyelocytes Relative: 1 %
Monocytes Absolute: 0.2 10*3/uL (ref 0.1–1.0)
Monocytes Relative: 9 %
Myelocytes: 2 %
Neutro Abs: 1.1 10*3/uL — ABNORMAL LOW (ref 1.7–7.7)
Neutrophils Relative %: 48 %
Platelets: 20 10*3/uL — CL (ref 150–400)
RBC: 2.95 MIL/uL — ABNORMAL LOW (ref 3.87–5.11)
RDW: 19.4 % — ABNORMAL HIGH (ref 11.5–15.5)
Smear Review: DECREASED
WBC Morphology: ABNORMAL
WBC: 2.1 10*3/uL — ABNORMAL LOW (ref 4.0–10.5)
nRBC: 0.9 % — ABNORMAL HIGH (ref 0.0–0.2)

## 2019-04-02 LAB — SAMPLE TO BLOOD BANK

## 2019-04-02 MED ORDER — HEPARIN SOD (PORK) LOCK FLUSH 100 UNIT/ML IV SOLN
250.0000 [IU] | Freq: Once | INTRAVENOUS | Status: AC
  Administered 2019-04-02: 10:00:00 250 [IU] via INTRAVENOUS
  Filled 2019-04-02: qty 5

## 2019-04-02 MED ORDER — SODIUM CHLORIDE 0.9% FLUSH
10.0000 mL | Freq: Once | INTRAVENOUS | Status: AC
  Administered 2019-04-02: 10 mL via INTRAVENOUS
  Filled 2019-04-02: qty 10

## 2019-04-02 NOTE — Progress Notes (Signed)
Verbal order from [place] NP that pt does not need any blood products today. Pt updated and all questions answered at this time. PICC line dressing changed and pt was educated on where to go for her chest x-ray that has been ordered on 03/31/2019. pt states that she will go today to get chest x-ray completed. VSS and pt stable for discharge.   Dessie Delcarlo [employer]

## 2019-04-03 ENCOUNTER — Other Ambulatory Visit: Payer: Self-pay

## 2019-04-04 ENCOUNTER — Other Ambulatory Visit: Payer: Self-pay | Admitting: Nurse Practitioner

## 2019-04-04 ENCOUNTER — Inpatient Hospital Stay: Payer: Medicare Other

## 2019-04-04 ENCOUNTER — Inpatient Hospital Stay: Payer: Medicare Other | Admitting: *Deleted

## 2019-04-04 ENCOUNTER — Other Ambulatory Visit: Payer: Self-pay

## 2019-04-04 DIAGNOSIS — C9200 Acute myeloblastic leukemia, not having achieved remission: Secondary | ICD-10-CM

## 2019-04-04 DIAGNOSIS — Z452 Encounter for adjustment and management of vascular access device: Secondary | ICD-10-CM

## 2019-04-04 LAB — CBC WITH DIFFERENTIAL/PLATELET
Abs Immature Granulocytes: 0 10*3/uL (ref 0.00–0.07)
Band Neutrophils: 3 %
Basophils Absolute: 0 10*3/uL (ref 0.0–0.1)
Basophils Relative: 0 %
Blasts: 4 %
Eosinophils Absolute: 0 10*3/uL (ref 0.0–0.5)
Eosinophils Relative: 1 %
HCT: 24.3 % — ABNORMAL LOW (ref 36.0–46.0)
Hemoglobin: 7.8 g/dL — ABNORMAL LOW (ref 12.0–15.0)
Lymphocytes Relative: 39 %
Lymphs Abs: 0.5 10*3/uL — ABNORMAL LOW (ref 0.7–4.0)
MCH: 27.5 pg (ref 26.0–34.0)
MCHC: 32.1 g/dL (ref 30.0–36.0)
MCV: 85.6 fL (ref 80.0–100.0)
Metamyelocytes Relative: 1 %
Monocytes Absolute: 0.2 10*3/uL (ref 0.1–1.0)
Monocytes Relative: 16 %
Neutro Abs: 0.5 10*3/uL — ABNORMAL LOW (ref 1.7–7.7)
Neutrophils Relative %: 36 %
Platelets: 11 10*3/uL — CL (ref 150–400)
RBC: 2.84 MIL/uL — ABNORMAL LOW (ref 3.87–5.11)
RDW: 19.3 % — ABNORMAL HIGH (ref 11.5–15.5)
Smear Review: DECREASED
WBC: 1.3 10*3/uL — CL (ref 4.0–10.5)
nRBC: 2.3 % — ABNORMAL HIGH (ref 0.0–0.2)

## 2019-04-04 MED ORDER — SODIUM CHLORIDE 0.9% IV SOLUTION
250.0000 mL | Freq: Once | INTRAVENOUS | Status: AC
  Administered 2019-04-04: 250 mL via INTRAVENOUS
  Filled 2019-04-04: qty 250

## 2019-04-04 MED ORDER — HEPARIN SOD (PORK) LOCK FLUSH 100 UNIT/ML IV SOLN
250.0000 [IU] | INTRAVENOUS | Status: AC | PRN
  Administered 2019-04-04: 250 [IU]
  Filled 2019-04-04: qty 5

## 2019-04-04 MED ORDER — ACETAMINOPHEN 325 MG PO TABS
650.0000 mg | ORAL_TABLET | Freq: Once | ORAL | Status: AC
  Administered 2019-04-04: 650 mg via ORAL
  Filled 2019-04-04: qty 2

## 2019-04-04 MED ORDER — SODIUM CHLORIDE 0.9% FLUSH
10.0000 mL | Freq: Once | INTRAVENOUS | Status: AC
  Administered 2019-04-04: 10 mL via INTRAVENOUS
  Filled 2019-04-04: qty 10

## 2019-04-04 MED ORDER — SODIUM CHLORIDE 0.9% FLUSH
10.0000 mL | INTRAVENOUS | Status: DC | PRN
  Filled 2019-04-04: qty 10

## 2019-04-05 LAB — PREPARE PLATELET PHERESIS: Unit division: 0

## 2019-04-05 LAB — BPAM PLATELET PHERESIS
Blood Product Expiration Date: 202010262359
ISSUE DATE / TIME: 202010231231
Unit Type and Rh: 6200

## 2019-04-07 ENCOUNTER — Inpatient Hospital Stay (HOSPITAL_BASED_OUTPATIENT_CLINIC_OR_DEPARTMENT_OTHER): Payer: Medicare Other | Admitting: Nurse Practitioner

## 2019-04-07 ENCOUNTER — Inpatient Hospital Stay: Payer: Medicare Other | Admitting: *Deleted

## 2019-04-07 ENCOUNTER — Other Ambulatory Visit: Payer: Self-pay

## 2019-04-07 ENCOUNTER — Inpatient Hospital Stay: Payer: Medicare Other

## 2019-04-07 ENCOUNTER — Encounter: Payer: Self-pay | Admitting: Nurse Practitioner

## 2019-04-07 VITALS — BP 111/54 | HR 69 | Temp 97.2°F | Resp 18

## 2019-04-07 VITALS — BP 126/68 | HR 86 | Temp 96.7°F | Resp 18 | Wt 91.0 lb

## 2019-04-07 DIAGNOSIS — Z95828 Presence of other vascular implants and grafts: Secondary | ICD-10-CM

## 2019-04-07 DIAGNOSIS — C9200 Acute myeloblastic leukemia, not having achieved remission: Secondary | ICD-10-CM

## 2019-04-07 DIAGNOSIS — D469 Myelodysplastic syndrome, unspecified: Secondary | ICD-10-CM

## 2019-04-07 DIAGNOSIS — D61818 Other pancytopenia: Secondary | ICD-10-CM | POA: Diagnosis not present

## 2019-04-07 LAB — COMPREHENSIVE METABOLIC PANEL
ALT: 13 U/L (ref 0–44)
AST: 21 U/L (ref 15–41)
Albumin: 3.6 g/dL (ref 3.5–5.0)
Alkaline Phosphatase: 75 U/L (ref 38–126)
Anion gap: 9 (ref 5–15)
BUN: 22 mg/dL (ref 8–23)
CO2: 25 mmol/L (ref 22–32)
Calcium: 9.1 mg/dL (ref 8.9–10.3)
Chloride: 100 mmol/L (ref 98–111)
Creatinine, Ser: 0.5 mg/dL (ref 0.44–1.00)
GFR calc Af Amer: >60 mL/min (ref >60)
GFR calc non Af Amer: >60 mL/min (ref >60)
Glucose, Bld: 167 mg/dL — ABNORMAL HIGH (ref 70–99)
Potassium: 3.8 mmol/L (ref 3.5–5.1)
Sodium: 134 mmol/L — ABNORMAL LOW (ref 135–145)
Total Bilirubin: 0.7 mg/dL (ref 0.3–1.2)
Total Protein: 6.1 g/dL — ABNORMAL LOW (ref 6.5–8.1)

## 2019-04-07 LAB — CBC WITH DIFFERENTIAL/PLATELET
Abs Immature Granulocytes: 0.15 10*3/uL — ABNORMAL HIGH (ref 0.00–0.07)
Basophils Absolute: 0 10*3/uL (ref 0.0–0.1)
Basophils Relative: 0 %
Eosinophils Absolute: 0 10*3/uL (ref 0.0–0.5)
Eosinophils Relative: 0 %
HCT: 22.8 % — ABNORMAL LOW (ref 36.0–46.0)
Hemoglobin: 7.3 g/dL — ABNORMAL LOW (ref 12.0–15.0)
Immature Granulocytes: 16 %
Lymphocytes Relative: 38 %
Lymphs Abs: 0.4 10*3/uL — ABNORMAL LOW (ref 0.7–4.0)
MCH: 27.2 pg (ref 26.0–34.0)
MCHC: 32 g/dL (ref 30.0–36.0)
MCV: 85.1 fL (ref 80.0–100.0)
Monocytes Absolute: 0.2 10*3/uL (ref 0.1–1.0)
Monocytes Relative: 23 %
Neutro Abs: 0.2 10*3/uL — ABNORMAL LOW (ref 1.7–7.7)
Neutrophils Relative %: 23 %
Platelets: 11 10*3/uL — CL (ref 150–400)
RBC: 2.68 MIL/uL — ABNORMAL LOW (ref 3.87–5.11)
RDW: 19.1 % — ABNORMAL HIGH (ref 11.5–15.5)
Smear Review: NORMAL
WBC: 0.9 10*3/uL — CL (ref 4.0–10.5)
nRBC: 2.2 % — ABNORMAL HIGH (ref 0.0–0.2)

## 2019-04-07 LAB — LACTATE DEHYDROGENASE: LDH: 252 U/L — ABNORMAL HIGH (ref 98–192)

## 2019-04-07 LAB — SAMPLE TO BLOOD BANK

## 2019-04-07 LAB — PREPARE RBC (CROSSMATCH)

## 2019-04-07 MED ORDER — SODIUM CHLORIDE 0.9% FLUSH
10.0000 mL | Freq: Once | INTRAVENOUS | Status: AC
  Administered 2019-04-07: 10 mL via INTRAVENOUS
  Filled 2019-04-07: qty 10

## 2019-04-07 MED ORDER — FILGRASTIM-AAFI 300 MCG/0.5ML IJ SOSY
300.0000 ug | PREFILLED_SYRINGE | Freq: Once | INTRAMUSCULAR | Status: AC
  Administered 2019-04-07: 13:00:00 300 ug via SUBCUTANEOUS
  Filled 2019-04-07: qty 0.5

## 2019-04-07 MED ORDER — SODIUM CHLORIDE 0.9% IV SOLUTION
250.0000 mL | Freq: Once | INTRAVENOUS | Status: AC
  Administered 2019-04-07: 10:00:00 250 mL via INTRAVENOUS
  Filled 2019-04-07: qty 250

## 2019-04-07 MED ORDER — HEPARIN SOD (PORK) LOCK FLUSH 100 UNIT/ML IV SOLN
250.0000 [IU] | INTRAVENOUS | Status: AC | PRN
  Administered 2019-04-07: 14:00:00 250 [IU]
  Filled 2019-04-07: qty 5

## 2019-04-07 MED ORDER — ACETAMINOPHEN 325 MG PO TABS
650.0000 mg | ORAL_TABLET | Freq: Once | ORAL | Status: AC
  Administered 2019-04-07: 650 mg via ORAL
  Filled 2019-04-07: qty 2

## 2019-04-07 NOTE — Progress Notes (Signed)
[place] OFFICE PROGRESS NOTE  Patient Care Team: Rusty Aus, MD as PCP - General (Internal Medicine)  CHIEF COMPLAINTS/PURPOSE OF CONSULTATION: Acute myeloid leukemia   Oncology History Overview Note  # SEP 2017- MYELOPROLIFERATIVE NEOPLASM- [WBC- 98; normal Hb/platelets] hypercellular bone marrow 90-95% proliferation of myeloid cells in various stages of maturation; relative erythroid hypoplasia and proliferation of atypical megakaryocytes; no increase in blasts; peripheral blood Bcr-Abl-NEG; Cytogenetics- WNL. NEG- Jak-2/MPL/CALR [place] limited- mild splenomegaly [~10cm; 463cm3]; OCT 2017- second opinion at [facility]. Surveillance.   # With progressive leukocytosis we evaluated her a month ago and sent BM for exam. She has a progressive leukocytosis so hydrea 587m daily was started on 10/20/2016 and increased to 2gm daily then back down to one daily, then 5 days per week over time due to counts drop. Then we held her hydrea since 04/09/2018 due to continued declining Hb and Plt.s  BMB 06/24/18 showed markedly hypercellular BM 95% with myeloid hyperplasia and atypical megakaryocytic hyperplasia. No significant increase in blasts. 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 BCR / ABL negative, FISH normal. Flow Showed 2%CD34-positive myeloid blasts. Myeloid precursors with low side scatter. Pathogenic variants were detected in the ASXL1, CCND2, CUX1, and U2AF1 genes.  # March 2020- wbc- 14/ Hb 9.5/ platelets- 54.   ---------------------------------------------------   # June 8th 2020- Acute myeloid leukemia [peripheral blood flow cytometry;NGS- pending]- June 15th Vidaza [SQ 1-5 + Venatoclax- #1in pt]; tumor lysis prophylaxis  # Day #28 bone marrow biopsy-  7/14- BMx- NO BLASTS; positive for dysplastic changes. OFF Venatoclax [since 7/17] sec to cytopenias.  #817 cycle #2 Vidaza+ venetoclax 100 mg; stop venetoclax on  8/24 [severe pancytopenia]  # 02/18/2019-start gilteritinib 80 mg a day; HELD on 9/14 [sec to severe pan]  # AUG 2020- AST elevation- 248-?sec to Posonoazole; RESOLVED; continue at lower dose 100 mg/day.   # 2002 [[place]] BREAST CA s/p Lumpect RT; [? Stage I] no chemo s/p AI  # F-ONE HEM: FLT3 ALTERATION NOTED  DIAGNOSIS: ACUTE MYELOID LEUKEMIA GOALS: pallaitive  CURRENT/MOST RECENT THERAPY : Vidaza+ venotoclax.      MDS (myelodysplastic syndrome) ([facility] [place])  08/28/2018 Initial Diagnosis   MDS (myel[place]ysplastic syndrome) ([facility]   Acute myeloid leukemia not having achieved remission ([facility]  11/25/2018 Initial Diagnosis   AML (acute myeloid leukemia) with failed remission ([facility]   11/25/2018 - 12/22/2018 Chemotherapy   The patient had azaCITIDine (VIDAZA) chemo injection 105 mg, 75 mg/m2 = 105 mg, Subcutaneous,  Once, 1 of 4 cycles Administration: 105 mg (11/25/2018), 105 mg (11/26/2018), 105 mg (11/27/2018), 105 mg (11/28/2018), 105 mg (11/29/2018)  for chemotherapy treatment.    01/27/2019 -  Chemotherapy   The patient had palonosetron (ALOXI) injection 0.25 mg, 0.25 mg, Intravenous,  Once, 1 of 4 cycles Administration: 0.25 mg (01/27/2019), 0.25 mg (01/29/2019), 0.25 mg (01/31/2019) azaCITIDine (VIDAZA) 100 mg in sodium chloride 0.9 % 50 mL chemo infusion, 75 mg/m2 = 100 mg, Intravenous, Once, 1 of 4 cycles Administration: 100 mg (01/27/2019), 100 mg (01/28/2019), 100 mg (01/29/2019), 100 mg (01/30/2019), 100 mg (01/31/2019)  for chemotherapy treatment.      HISTORY OF PRESENTING ILLNESS:  Kathryn Hahn 85y.o.  female  with monocytic differentiation, currently on surveillance/alternate therapies due to severe pancytopenia from chemotherapy, returns to clinic today for follow-up.  I last saw this patient last week.  She has been on second line Gilteritinib which is been held since 9/14 due to pancytopenia.  In the interim, platelet  count 11, ANC 0.3, hemoglobin 8.5.  She received  nivestym and platelet transfusion.  On 04/02/2019, platelet count 20, ANC 1.1, hemoglobin 8.2.  She received Nivestym.  On 04/04/2019 platelet count 11, ANC 0.5, hemoglobin 7.8.  She received platelet transfusion at that time.  Today, she says she feels "blah". Energy is down. Was slightly better late last week but over the weekend felt worse.  She continues to deny blood in stools or black stools.  No fever or chills.  No dizziness or recent falls.  Ribs continue to improve.  No cough or wheezing.  Continues to have easy bruising but stable.  One episode of bleeding gums in interim.  Weight has been stable.  Review of Systems  Constitutional: Positive for malaise/fatigue. Negative for chills, diaphoresis, fever and weight loss.  HENT: Negative for nosebleeds and sore throat.   Eyes: Negative for double vision.  Respiratory: Positive for shortness of breath (Stable). Negative for cough, hemoptysis, sputum production and wheezing.   Cardiovascular: Positive for leg swelling (Stable). Negative for chest pain, palpitations and orthopnea.  Gastrointestinal: Negative for abdominal pain, blood in stool, constipation, diarrhea, heartburn, melena, nausea and vomiting.  Genitourinary: Negative for dysuria, frequency and urgency.  Musculoskeletal: Negative for back pain and joint pain.  Skin: Negative.  Negative for itching and rash.  Neurological: Negative for dizziness, tingling, focal weakness, weakness and headaches.  Endo/Heme/Allergies: Bruises/bleeds easily (Improved).  Psychiatric/Behavioral: Negative for depression. The patient is not nervous/anxious and does not have insomnia.     MEDICAL HISTORY:  Past Medical History:  Diagnosis Date  . Anemia   . Arthritis   . Breast cancer ([place]) 2003   RT LUMPECTOMY  . Edema leg   . History of breast cancer 2003   post lumpectomy  . History of kidney stones   . Hyperlipemia, mixed   . Hypertension, essential   . Leukemia ([place])   . Leukocytosis    . MDS (myelodysplastic syndrome) ([place])   . Personal history of radiation therapy 2003   BREAST CA  . Renal stones   . Vitamin D deficiency     SURGICAL HISTORY: Past Surgical History:  Procedure Laterality Date  . BREAST BIOPSY Right 2003   Postive for cancer  . BREAST LUMPECTOMY Right 2003   BREAST CA  . KIDNEY STONE SURGERY Right     SOCIAL HISTORY: Social History   Socioeconomic History  . Marital status: Married    Spouse name: Not on file  . Number of children: Not on file  . Years of education: Not on file  . Highest education level: Not on file  Occupational History  . Not on file  Social Needs  . Financial resource strain: Not on file  . Food insecurity    Worry: Not on file    Inability: Not on file  . Transportation needs    Medical: Not on file    Non-medical: Not on file  Tobacco Use  . Smoking status: Never Smoker  . Smokeless tobacco: Never Used  Substance and Sexual Activity  . Alcohol use: Yes    Alcohol/week: 3.0 standard drinks    Types: 3 Glasses of wine per week  . Drug use: No  . Sexual activity: Not on file  Lifestyle  . Physical activity    Days per week: Not on file    Minutes per session: Not on file  . Stress: Not on file  Relationships  . Social Herbalist on phone:  Not on file    Gets together: Not on file    Attends religious service: Not on file    Active member of club or organization: Not on file    Attends meetings of clubs or organizations: Not on file    Relationship status: Not on file  . Intimate partner violence    Fear of current or ex partner: Not on file    Emotionally abused: Not on file    Physically abused: Not on file    Forced sexual activity: Not on file  Other Topics Concern  . Not on file  Social History Narrative    No smoking/alcohol; lives in [place] with family.  She lives in assisted living with her husband.    FAMILY HISTORY: Family History  Problem Relation Age of Onset  .  Stroke Mother   . Hypertension Father   . Stroke Father   . Breast cancer Neg Hx     ALLERGIES:  is allergic to terbinafine.  MEDICATIONS:  Current Outpatient Medications  Medication Sig Dispense Refill  . acetaminophen (TYLENOL) 325 MG tablet Take by mouth.    Marland Kitchen acyclovir (ZOVIRAX) 400 MG tablet ONE PILL A DAY [TO PREVENT SHINGLES] 90 tablet 1  . atenolol (TENORMIN) 50 MG tablet Take 1 tablet by mouth 2 (two) times daily.    . calcium carbonate (OSCAL) 1500 (600 Ca) MG TABS tablet Take 600 mg of elemental calcium by mouth daily with breakfast.    . Cholecalciferol (VITAMIN D) 2000 units tablet Take 1 tablet by mouth daily.    . Gilteritinib Fumarate (XOSPATA) 40 MG TABS Take 80 mg by mouth daily. 60 tablet 2  . heparin lock flush 100 UNIT/ML SOLN injection Inject into the vein.    . Infant Care Products (DERMACLOUD) CREA Apply topically.    Marland Kitchen levofloxacin (LEVAQUIN) 250 MG tablet Take 1 tablet (250 mg total) by mouth daily. 30 tablet 3  . losartan-hydrochlorothiazide (HYZAAR) 50-12.5 MG tablet Take 1 tablet by mouth daily.    . magic mouthwash w/lidocaine SOLN Take 5 mLs by mouth 4 (four) times daily as needed for mouth pain. 480 mL 3  . magnesium hydroxide (MILK OF MAGNESIA) 400 MG/5ML suspension Take 5 mLs by mouth every 4 (four) hours as needed.    . ondansetron (ZOFRAN) 8 MG tablet Take by mouth every 8 (eight) hours as needed for nausea or vomiting.    Marland Kitchen oxycodone (OXY-IR) 5 MG capsule Take 1 capsule (5 mg total) by mouth every 4 (four) hours as needed. 15 capsule 0  . polyethylene glycol (MIRALAX / GLYCOLAX) 17 g packet Take 17 g by mouth daily.    . posaconazole (NOXAFIL) 100 MG TBEC delayed-release tablet Take 2 tablets (200 mg total) by mouth daily. DO NOT START UNTIL-OK with MD. 60 tablet 3  . potassium chloride SA (K-DUR) 20 MEQ tablet Take 1 tablet (20 mEq total) by mouth 2 (two) times daily. 10 tablet 0  . senna (SENOKOT) 8.6 MG tablet Take 2 tablets by mouth 2 (two) times  daily.     No current facility-administered medications for this visit.    Facility-Administered Medications Ordered in Other Visits  Medication Dose Route Frequency Provider Last Rate Last Dose  . 0.9 %  sodium chloride infusion   Intravenous Continuous Jacquelin Hawking, NP   Stopped at 02/05/19 1630  . filgrastim-aafi (NIVESTYM) injection 300 mcg  300 mcg Subcutaneous Once Cammie Sickle, MD      . heparin lock  flush 100 unit/mL  250 Units Intracatheter PRN Verlon Au, NP        PHYSICAL EXAMINATION: ECOG PERFORMANCE STATUS: 1 - Symptomatic but completely ambulatory  Vitals:   04/07/19 0849  BP: 126/68  Pulse: 86  Resp: 18  Temp: (!) 96.7 F (35.9 C)  SpO2: 100%   Filed Weights   04/07/19 0849  Weight: 91 lb (41.3 kg)    Physical Exam  Constitutional: She is oriented to person, place, and time and well-developed, well-nourished, and in no distress.  Appears stated age. PICC line in place.  No active bleeding.  Masked  HENT:  Head: Normocephalic and atraumatic.  Mouth/Throat: Oropharynx is clear and moist. No oropharyngeal exudate.  Eyes: Conjunctivae are normal. No scleral icterus.  Neck: Normal range of motion. Neck supple.  Cardiovascular: Normal rate and regular rhythm.  Pulmonary/Chest: Effort normal and breath sounds normal. No respiratory distress.  Abdominal: Soft. Bowel sounds are normal. She exhibits no distension and no mass. There is no abdominal tenderness. There is no rebound and no guarding.  Musculoskeletal: Normal range of motion.        General: No tenderness or edema.     Comments: Sitting in wheelchair  Neurological: She is alert and oriented to person, place, and time.  Skin: Skin is warm. There is pallor.  Multiple bruises on lower legs, hands and arms. Torso bruising is improved  Psychiatric: Affect normal.    LABORATORY DATA:  I have reviewed the data as listed Lab Results  Component Value Date   WBC 0.9 (LL) 04/07/2019   HGB  7.3 (L) 04/07/2019   HCT 22.8 (L) 04/07/2019   MCV 85.1 04/07/2019   PLT 11 (LL) 04/07/2019   Recent Labs    03/24/19 0854 03/31/19 0830 04/07/19 0827  NA 135 136 134*  K 3.9 4.5 3.8  CL 103 102 100  CO2 _0 GLUCOSE 141* 119* 167*  BUN 22 26* 22  CREATININE 0.41* 0.47 0.50  CALCIUM 9.0 9.3 9.1  GFRNONAA >60 >60 >60  GFRAA >60 >60 >60  PROT 6.5 6.5 6.1*  ALBUMIN 3.8 3.7 3.6  AST _1 ALT _2 ALKPHOS 88 79 75  BILITOT 0.7 0.7 0.7    RADIOGRAPHIC STUDIES: I have personally reviewed the radiological images as listed and agreed with the findings in the report. Dg Chest 2 View  Result Date: 04/02/2019 CLINICAL DATA:  Right sided chest pain x July. Some wheezing and sob. Hx HTN, breast lumpectomy in 2002. EXAM: CHEST - 2 VIEW COMPARISON:  01/14/2019 FINDINGS: Interval resolution of the airspace opacities seen previously. Lungs are clear. Heart size and mediastinal contours are within normal limits. Aortic Atherosclerosis (ICD10-170.0). No effusion. No pneumothorax. Multiple healing right rib fractures. Surgical clips in the right axilla. Fracture deformity of the proximal right humerus. Stable left PICC line to the mid SVC. Mild superior endplate T9 compression deformity, stable since 01/10/2019. IMPRESSION: 1. No acute cardiopulmonary disease. 2. Multiple healing right rib fractures. 3. Stable T9 compression deformity. Electronically Signed   By: Lucrezia Europe M.D.   On: 04/02/2019 11:01    ASSESSMENT & PLAN:  Acute myeloid leukemia not having achieved remission ([place]) #Acute myeloid leukemia with monocytic differentiation-currently second line GILTERITNIB-on hold since 9/14.  Currently receiving transfusion support.  Stable.   #Pancytopenia-likely secondary to chemotherapy/gilteritnib rather than underlying malignancy. LDH 252 (decreased); likely secondary to MDS-stable.     #Today's labs reviewed with patient in detail-WBC  0.9, ANC 0.2.  Slightly worse.   Hemoglobin 7.3 (worse), platelets 11 (stable).   - Continue to hold Gilteritinib 80 mg daily.  Will consider starting back at 1 pill (40 mg) once a day once counts recover.   - Proceed with 1 unit platelet transfusion/single donor platelets irradiated today given bleeding.   - Proceed with nivestym today with goal ANC > 0.5.  - Proceed with 1 unit of pRBCs-irradiated today for goal hemoglobin > 8.0.   #Easy bruising/buccal mucosal ecchymosis-1 episode of bleeding in the interim.  Bruising somewhat improved.  Continue using soft toothbrush/swab for oral hydration.  If worsens, consider Amicar oral rinse.  # Wheezing -wheezing heard in right lower lobe and right upper lobe of lung last visit.  Interval chest x-ray did not reveal infection.  No acute cardiopulmonary disease, multiple healing right rib fractures with stable T9 compression deformity.  Asymptomatic.  Wheezing improved today.  Continue to monitor.  #History of mechanical fall with intracranial bleed-minor bleed.  No seizures.  No recent falls.  Continue to monitor.   #Supportive care for AML-  -Continue Noxafil/posaconazole 156m and acyclovir 400 mg, and levaquin 250 mg for prophylaxis. - Continue PICC line dressing changes weekly  Disposition: # Transfuse 1 unit of platelets today, Nivestym, & 1 unit pRBCs  # 04/09/2019- labs (cbc, hold tube), possible platelets, poss Nivestym, poss prbcs # 04/11/2019- labs (cbc, hold tube), possible platelets, poss Nivestym, poss prbcs # 04/14/2019- labs (cbc, cmp, ldh, hold tube), Dr. BRogue Bussing poss plt, poss nivestym, poss prbcs    LBeckey Rutter DNP, AGNP-C [place] [place] Dr. BRogue Bussing

## 2019-04-07 NOTE — Assessment & Plan Note (Addendum)
#  Acute myeloid leukemia with monocytic differentiation-currently second line GILTERITNIB-on hold since 9/14.  Currently receiving transfusion support.  Stable.   #Pancytopenia-likely secondary to chemotherapy/gilteritnib rather than underlying malignancy. LDH 252 (decreased); likely secondary to MDS-stable.     #Today's labs reviewed with patient in detail-WBC 0.9, ANC 0.2.  Slightly worse.  Hemoglobin 7.3 (worse), platelets 11 (stable).   - Continue to hold Gilteritinib 80 mg daily.  Will consider starting back at 1 pill (40 mg) once a day once counts recover.   - Proceed with 1 unit platelet transfusion/single donor platelets irradiated today given bleeding.   - Proceed with nivestym today with goal ANC > 0.5.  - Proceed with 1 unit of pRBCs-irradiated today for goal hemoglobin > 8.0.   #Easy bruising/buccal mucosal ecchymosis-1 episode of bleeding in the interim.  Bruising somewhat improved.  Continue using soft toothbrush/swab for oral hydration.  If worsens, consider Amicar oral rinse.  # Wheezing -wheezing heard in right lower lobe and right upper lobe of lung last visit.  Interval chest x-ray did not reveal infection.  No acute cardiopulmonary disease, multiple healing right rib fractures with stable T9 compression deformity.  Asymptomatic.  Wheezing improved today.  Continue to monitor.  #History of mechanical fall with intracranial bleed-minor bleed.  No seizures.  No recent falls.  Continue to monitor.   #Supportive care for AML-  -Continue Noxafil/posaconazole 100mg  and acyclovir 400 mg, and levaquin 250 mg for prophylaxis. - Continue PICC line dressing changes weekly  Disposition: # Transfuse 1 unit of platelets today, Nivestym, & 1 unit pRBCs  # 04/09/2019- labs (cbc, hold tube), possible platelets, poss Nivestym, poss prbcs # 04/11/2019- labs (cbc, hold tube), possible platelets, poss Nivestym, poss prbcs # 04/14/2019- labs (cbc, cmp, ldh, hold tube), Dr. Rogue Bussing, poss plt,  poss nivestym, poss prbcs

## 2019-04-08 ENCOUNTER — Other Ambulatory Visit: Payer: Self-pay

## 2019-04-08 LAB — PREPARE PLATELET PHERESIS: Unit division: 0

## 2019-04-08 LAB — BPAM PLATELET PHERESIS
Blood Product Expiration Date: 202010272359
ISSUE DATE / TIME: 202010261250
Unit Type and Rh: 7300

## 2019-04-08 LAB — TYPE AND SCREEN
ABO/RH(D): O POS
Antibody Screen: NEGATIVE
Unit division: 0

## 2019-04-08 LAB — BPAM RBC
Blood Product Expiration Date: 202011132359
ISSUE DATE / TIME: 202010261034
Unit Type and Rh: 5100

## 2019-04-09 ENCOUNTER — Other Ambulatory Visit: Payer: Self-pay | Admitting: *Deleted

## 2019-04-09 ENCOUNTER — Inpatient Hospital Stay: Payer: Medicare Other | Admitting: *Deleted

## 2019-04-09 ENCOUNTER — Other Ambulatory Visit: Payer: Self-pay | Admitting: Nurse Practitioner

## 2019-04-09 ENCOUNTER — Inpatient Hospital Stay: Payer: Medicare Other

## 2019-04-09 ENCOUNTER — Other Ambulatory Visit: Payer: Self-pay

## 2019-04-09 VITALS — BP 105/62 | HR 74 | Temp 98.3°F | Resp 18

## 2019-04-09 DIAGNOSIS — D469 Myelodysplastic syndrome, unspecified: Secondary | ICD-10-CM

## 2019-04-09 DIAGNOSIS — C9200 Acute myeloblastic leukemia, not having achieved remission: Secondary | ICD-10-CM

## 2019-04-09 DIAGNOSIS — Z95828 Presence of other vascular implants and grafts: Secondary | ICD-10-CM

## 2019-04-09 LAB — CBC WITH DIFFERENTIAL/PLATELET
Abs Immature Granulocytes: 0.3 10*3/uL — ABNORMAL HIGH (ref 0.00–0.07)
Basophils Absolute: 0 10*3/uL (ref 0.0–0.1)
Basophils Relative: 0 %
Blasts: 5 %
Eosinophils Absolute: 0 10*3/uL (ref 0.0–0.5)
Eosinophils Relative: 2 %
HCT: 26.7 % — ABNORMAL LOW (ref 36.0–46.0)
Hemoglobin: 8.7 g/dL — ABNORMAL LOW (ref 12.0–15.0)
Lymphocytes Relative: 28 %
Lymphs Abs: 0.6 10*3/uL — ABNORMAL LOW (ref 0.7–4.0)
MCH: 27.8 pg (ref 26.0–34.0)
MCHC: 32.6 g/dL (ref 30.0–36.0)
MCV: 85.3 fL (ref 80.0–100.0)
Metamyelocytes Relative: 7 %
Monocytes Absolute: 0.3 10*3/uL (ref 0.1–1.0)
Monocytes Relative: 13 %
Myelocytes: 5 %
Neutro Abs: 0.8 10*3/uL — ABNORMAL LOW (ref 1.7–7.7)
Neutrophils Relative %: 40 %
Platelets: 11 10*3/uL — CL (ref 150–400)
RBC: 3.13 MIL/uL — ABNORMAL LOW (ref 3.87–5.11)
RDW: 18.8 % — ABNORMAL HIGH (ref 11.5–15.5)
Smear Review: DECREASED
WBC: 2.1 10*3/uL — ABNORMAL LOW (ref 4.0–10.5)
nRBC: 0.9 % — ABNORMAL HIGH (ref 0.0–0.2)
nRBC: 1 /100 WBC — ABNORMAL HIGH

## 2019-04-09 LAB — TYPE AND SCREEN
ABO/RH(D): O POS
Antibody Screen: NEGATIVE

## 2019-04-09 LAB — SAMPLE TO BLOOD BANK

## 2019-04-09 MED ORDER — SODIUM CHLORIDE 0.9% FLUSH
10.0000 mL | Freq: Once | INTRAVENOUS | Status: AC
  Administered 2019-04-09: 10 mL via INTRAVENOUS
  Filled 2019-04-09: qty 10

## 2019-04-09 MED ORDER — HEPARIN SOD (PORK) LOCK FLUSH 100 UNIT/ML IV SOLN
250.0000 [IU] | INTRAVENOUS | Status: AC | PRN
  Administered 2019-04-09: 250 [IU]
  Filled 2019-04-09: qty 5

## 2019-04-09 MED ORDER — SODIUM CHLORIDE 0.9% IV SOLUTION
250.0000 mL | Freq: Once | INTRAVENOUS | Status: AC
  Administered 2019-04-09: 250 mL via INTRAVENOUS
  Filled 2019-04-09: qty 250

## 2019-04-09 MED ORDER — FILGRASTIM-AAFI 300 MCG/0.5ML IJ SOSY
300.0000 ug | PREFILLED_SYRINGE | Freq: Once | INTRAMUSCULAR | Status: AC
  Administered 2019-04-09: 300 ug via SUBCUTANEOUS
  Filled 2019-04-09: qty 0.5

## 2019-04-09 MED ORDER — ACETAMINOPHEN 325 MG PO TABS
650.0000 mg | ORAL_TABLET | Freq: Once | ORAL | Status: AC
  Administered 2019-04-09: 650 mg via ORAL
  Filled 2019-04-09: qty 2

## 2019-04-09 MED ORDER — SODIUM CHLORIDE 0.9% FLUSH
10.0000 mL | INTRAVENOUS | Status: AC | PRN
  Administered 2019-04-09: 10 mL
  Filled 2019-04-09: qty 10

## 2019-04-10 LAB — PREPARE PLATELET PHERESIS: Unit division: 0

## 2019-04-10 LAB — BPAM PLATELET PHERESIS
Blood Product Expiration Date: 202010302359
ISSUE DATE / TIME: 202010281243
Unit Type and Rh: 6200

## 2019-04-11 ENCOUNTER — Other Ambulatory Visit: Payer: Self-pay

## 2019-04-11 ENCOUNTER — Inpatient Hospital Stay: Payer: Medicare Other

## 2019-04-11 ENCOUNTER — Inpatient Hospital Stay: Payer: Medicare Other | Admitting: *Deleted

## 2019-04-11 ENCOUNTER — Telehealth: Payer: Self-pay | Admitting: *Deleted

## 2019-04-11 DIAGNOSIS — Z95828 Presence of other vascular implants and grafts: Secondary | ICD-10-CM

## 2019-04-11 DIAGNOSIS — C9200 Acute myeloblastic leukemia, not having achieved remission: Secondary | ICD-10-CM | POA: Diagnosis not present

## 2019-04-11 DIAGNOSIS — Z452 Encounter for adjustment and management of vascular access device: Secondary | ICD-10-CM

## 2019-04-11 LAB — CBC WITH DIFFERENTIAL/PLATELET
Abs Immature Granulocytes: 0.2 10*3/uL — ABNORMAL HIGH (ref 0.00–0.07)
Basophils Absolute: 0 10*3/uL (ref 0.0–0.1)
Basophils Relative: 0 %
Blasts: 11 %
Eosinophils Absolute: 0 10*3/uL (ref 0.0–0.5)
Eosinophils Relative: 1 %
HCT: 28.1 % — ABNORMAL LOW (ref 36.0–46.0)
Hemoglobin: 8.9 g/dL — ABNORMAL LOW (ref 12.0–15.0)
Lymphocytes Relative: 37 %
Lymphs Abs: 1.1 10*3/uL (ref 0.7–4.0)
MCH: 27.4 pg (ref 26.0–34.0)
MCHC: 31.7 g/dL (ref 30.0–36.0)
MCV: 86.5 fL (ref 80.0–100.0)
Metamyelocytes Relative: 4 %
Monocytes Absolute: 0.5 10*3/uL (ref 0.1–1.0)
Monocytes Relative: 17 %
Myelocytes: 2 %
Neutro Abs: 0.9 10*3/uL — ABNORMAL LOW (ref 1.7–7.7)
Neutrophils Relative %: 28 %
Platelets: 19 10*3/uL — CL (ref 150–400)
RBC: 3.25 MIL/uL — ABNORMAL LOW (ref 3.87–5.11)
RDW: 19.2 % — ABNORMAL HIGH (ref 11.5–15.5)
Smear Review: NORMAL
WBC: 3.1 10*3/uL — ABNORMAL LOW (ref 4.0–10.5)
nRBC: 1 % — ABNORMAL HIGH (ref 0.0–0.2)

## 2019-04-11 MED ORDER — SODIUM CHLORIDE 0.9% FLUSH
10.0000 mL | Freq: Once | INTRAVENOUS | Status: AC
  Administered 2019-04-11: 10 mL via INTRAVENOUS
  Filled 2019-04-11: qty 10

## 2019-04-11 NOTE — Telephone Encounter (Signed)
Spoke with patient. No blood or plts needed today. Per lauren, pt's Kathryn Hahn was held today. Goal of ANC being greater than 500.   Pt originally scheduled for return apts on Tuesday 11/3. Per lauren, Np would like these apts to moved to 11/2. There are no infusion chairs available on 11/2 - patient is aware. Due to her 8 am virtual apts at [facility] on Tuesday, she would like to move the lab/md (virtual apt/facetime with Dr. B) to Monday 11/2.  She understands that if her hgb/plts is critical on Monday she main require to be transfuse in the SDS dept. Otherwise, at this time, we will keep the apts as sch. On Tuesday for infusion.

## 2019-04-14 ENCOUNTER — Other Ambulatory Visit: Payer: Self-pay

## 2019-04-14 ENCOUNTER — Inpatient Hospital Stay: Payer: Medicare Other | Admitting: *Deleted

## 2019-04-14 ENCOUNTER — Inpatient Hospital Stay: Payer: Medicare Other | Attending: Nurse Practitioner | Admitting: Internal Medicine

## 2019-04-14 ENCOUNTER — Telehealth: Payer: Self-pay

## 2019-04-14 ENCOUNTER — Other Ambulatory Visit: Payer: Self-pay | Admitting: *Deleted

## 2019-04-14 VITALS — BP 116/76 | HR 69 | Temp 97.0°F

## 2019-04-14 DIAGNOSIS — Z79899 Other long term (current) drug therapy: Secondary | ICD-10-CM | POA: Insufficient documentation

## 2019-04-14 DIAGNOSIS — C9200 Acute myeloblastic leukemia, not having achieved remission: Secondary | ICD-10-CM

## 2019-04-14 DIAGNOSIS — D696 Thrombocytopenia, unspecified: Secondary | ICD-10-CM | POA: Diagnosis not present

## 2019-04-14 DIAGNOSIS — R7402 Elevation of levels of lactic acid dehydrogenase (LDH): Secondary | ICD-10-CM | POA: Diagnosis not present

## 2019-04-14 DIAGNOSIS — Z9181 History of falling: Secondary | ICD-10-CM | POA: Diagnosis not present

## 2019-04-14 DIAGNOSIS — D61818 Other pancytopenia: Secondary | ICD-10-CM | POA: Insufficient documentation

## 2019-04-14 DIAGNOSIS — D701 Agranulocytosis secondary to cancer chemotherapy: Secondary | ICD-10-CM | POA: Insufficient documentation

## 2019-04-14 DIAGNOSIS — D6481 Anemia due to antineoplastic chemotherapy: Secondary | ICD-10-CM | POA: Diagnosis not present

## 2019-04-14 DIAGNOSIS — Z87820 Personal history of traumatic brain injury: Secondary | ICD-10-CM | POA: Diagnosis not present

## 2019-04-14 DIAGNOSIS — Z452 Encounter for adjustment and management of vascular access device: Secondary | ICD-10-CM

## 2019-04-14 DIAGNOSIS — Z95828 Presence of other vascular implants and grafts: Secondary | ICD-10-CM

## 2019-04-14 LAB — COMPREHENSIVE METABOLIC PANEL
ALT: 12 U/L (ref 0–44)
AST: 25 U/L (ref 15–41)
Albumin: 3.8 g/dL (ref 3.5–5.0)
Alkaline Phosphatase: 74 U/L (ref 38–126)
Anion gap: 14 (ref 5–15)
BUN: 23 mg/dL (ref 8–23)
CO2: 23 mmol/L (ref 22–32)
Calcium: 9.2 mg/dL (ref 8.9–10.3)
Chloride: 100 mmol/L (ref 98–111)
Creatinine, Ser: 0.77 mg/dL (ref 0.44–1.00)
GFR calc Af Amer: >60 mL/min (ref >60)
GFR calc non Af Amer: >60 mL/min (ref >60)
Glucose, Bld: 191 mg/dL — ABNORMAL HIGH (ref 70–99)
Potassium: 3.5 mmol/L (ref 3.5–5.1)
Sodium: 137 mmol/L (ref 135–145)
Total Bilirubin: 0.5 mg/dL (ref 0.3–1.2)
Total Protein: 6.5 g/dL (ref 6.5–8.1)

## 2019-04-14 LAB — SAMPLE TO BLOOD BANK

## 2019-04-14 LAB — CBC WITH DIFFERENTIAL/PLATELET
Abs Immature Granulocytes: 0.1 10*3/uL — ABNORMAL HIGH (ref 0.00–0.07)
Band Neutrophils: 1 %
Basophils Absolute: 0 10*3/uL (ref 0.0–0.1)
Basophils Relative: 0 %
Blasts: 16 %
Eosinophils Absolute: 0 10*3/uL (ref 0.0–0.5)
Eosinophils Relative: 0 %
HCT: 29.3 % — ABNORMAL LOW (ref 36.0–46.0)
Hemoglobin: 9.3 g/dL — ABNORMAL LOW (ref 12.0–15.0)
Lymphocytes Relative: 51 %
Lymphs Abs: 1.5 10*3/uL (ref 0.7–4.0)
MCH: 27.4 pg (ref 26.0–34.0)
MCHC: 31.7 g/dL (ref 30.0–36.0)
MCV: 86.4 fL (ref 80.0–100.0)
Metamyelocytes Relative: 1 %
Monocytes Absolute: 0.5 10*3/uL (ref 0.1–1.0)
Monocytes Relative: 18 %
Myelocytes: 1 %
Neutro Abs: 0.4 10*3/uL — ABNORMAL LOW (ref 1.7–7.7)
Neutrophils Relative %: 11 %
Platelets: 10 10*3/uL — CL (ref 150–400)
Promyelocytes Relative: 1 %
RBC: 3.39 MIL/uL — ABNORMAL LOW (ref 3.87–5.11)
RDW: 19.6 % — ABNORMAL HIGH (ref 11.5–15.5)
Smear Review: NORMAL
WBC: 3 10*3/uL — ABNORMAL LOW (ref 4.0–10.5)
nRBC: 1.3 % — ABNORMAL HIGH (ref 0.0–0.2)

## 2019-04-14 LAB — LACTATE DEHYDROGENASE: LDH: 457 U/L — ABNORMAL HIGH (ref 98–192)

## 2019-04-14 MED ORDER — SODIUM CHLORIDE 0.9% FLUSH
10.0000 mL | Freq: Once | INTRAVENOUS | Status: AC
  Administered 2019-04-14: 09:00:00 10 mL via INTRAVENOUS
  Filled 2019-04-14: qty 10

## 2019-04-14 NOTE — Telephone Encounter (Signed)
Nutrition Follow-up:   Patient with acute myeloid leukemia.  Patient continuing to receive transfusion support.  Holding gilteritinib.    Spoke with patient via phone for nutrition follow-up.  Patient reports that she is doing ok. Unable to speak to RD as going to another MD appointment this afternoon.    Medications: reviewed  Labs: reviewed  Anthropometrics:   Weight 91 lb on 10/26, decreased from 93 lb on 9/28.    NUTRITION DIAGNOSIS: Unintentional weight loss continues   INTERVENTION:  Encouraged patient to continue to eat as much as she can.  Patient may benefit from trial of appetite stimulant Patient has contact information and will reach out to RD as needed    MONITORING, EVALUATION, GOAL: Patient will consume adequate calories and protein to prevent further weight loss.    NEXT VISIT: patient to contact RD  Kathryn Hahn B. Zenia Resides, [place], [place] Registered Dietitian [phone] (pager)

## 2019-04-14 NOTE — Progress Notes (Signed)
I connected with Kathryn Hahn on 04/14/19 at  9:30 AM EST by video enabled telemedicine visit and verified that I am speaking with the correct person using two identifiers.  I discussed the limitations, risks, security and privacy concerns of performing an evaluation and management service by telemedicine and the availability of in-person appointments. I also discussed with the patient that there may be a patient responsible charge related to this service. The patient expressed understanding and agreed to proceed.    Other persons participating in the visit and their role in the encounter: RN/medical reconciliation Patient's location: Office Provider's location: Home  Oncology History Overview Note  # SEP 2017- MYELOPROLIFERATIVE NEOPLASM- [WBC- 78; normal Hb/platelets] hypercellular bone marrow 90-95% proliferation of myeloid cells in various stages of maturation; relative erythroid hypoplasia and proliferation of atypical megakaryocytes; no increase in blasts; peripheral blood Bcr-Abl-NEG; Cytogenetics- WNL. NEG- Jak-2/MPL/CALR [place] limited- mild splenomegaly [~10cm; 463cm3]; OCT 2017- second opinion at [facility]. Surveillance.   # With progressive leukocytosis we evaluated her a month ago and sent BM for exam. She has a progressive leukocytosis so hydrea 532m daily was started on 10/20/2016 and increased to 2gm daily then back down to one daily, then 5 days per week over time due to counts drop. Then we held her hydrea since 04/09/2018 due to continued declining Hb and Plt.s  BMB 06/24/18 showed markedly hypercellular BM 95% with myeloid hyperplasia and atypical megakaryocytic hyperplasia. No significant increase in blasts. 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 BCR / ABL negative, FISH normal. Flow Showed 2%CD34-positive myeloid blasts. Myeloid precursors with low side scatter. Pathogenic variants were detected in the ASXL1, CCND2,  CUX1, and U2AF1 genes.  # March 2020- wbc- 14/ Hb 9.5/ platelets- 54.   ---------------------------------------------------   # June 8th 2020- Acute myeloid leukemia [peripheral blood flow cytometry;NGS- pending]- June 15th Vidaza [SQ 1-5 + Venatoclax- #1in pt]; tumor lysis prophylaxis  # Day #28 bone marrow biopsy-  7/14- BMx- NO BLASTS; positive for dysplastic changes. OFF Venatoclax [since 7/17] sec to cytopenias.  #817 cycle #2 Vidaza+ venetoclax 100 mg; stop venetoclax on 8/24 [severe pancytopenia]  # 02/18/2019-start gilteritinib 80 mg a day; HELD on 9/14 [sec to severe pan]  # AUG 2020- AST elevation- 248-?sec to Posonoazole; RESOLVED; continue at lower dose 100 mg/day.   # 2002 [[place]] BREAST CA s/p Lumpect RT; [? Stage I] no chemo s/p AI  # F-ONE HEM: FLT3 ALTERATION NOTED  DIAGNOSIS: ACUTE MYELOID LEUKEMIA GOALS: pallaitive  CURRENT/MOST RECENT THERAPY : Vidaza+ venotoclax.      MDS (myelodysplastic syndrome) ([facility] [facility]  08/28/2018 Initial Diagnosis   [place]plastic [place]) ([facility]   Acute myeloid leukemia not having achieved remission ([facility]  11/25/2018 Initial Diagnosis   AML (acute myeloid leukemia) with failed remission ([facility]   11/25/2018 - 12/22/2018 Chemotherapy   The patient had azaCITIDine (VIDAZA) chemo injection 105 mg, 75 mg/m2 = 105 mg, Subcutaneous,  Once, 1 of 4 cycles Administration: 105 mg (11/25/2018), 105 mg (11/26/2018), 105 mg (11/27/2018), 105 mg (11/28/2018), 105 mg (11/29/2018)  for chemotherapy treatment.    01/27/2019 -  Chemotherapy   The patient had palonosetron (ALOXI) injection 0.25 mg, 0.25 mg, Intravenous,  Once, 1 of 4 cycles Administration: 0.25 mg (01/27/2019), 0.25 mg (01/29/2019), 0.25 mg (01/31/2019) azaCITIDine (VIDAZA) 100 mg in sodium chloride 0.9 % 50 mL chemo infusion, 75 mg/m2 = 100 mg, Intravenous, Once, 1 of 4 cycles Administration: 100 mg (01/27/2019), 100 mg (01/28/2019), 100 mg (01/29/2019), 100 mg (  01/30/2019), 100 mg  (01/31/2019)  for chemotherapy treatment.       Chief Complaint: Acute myeloid leukemia   History of present illness:Kathryn Hahn 85 y.o.  female with history of acute myeloid leukemia/MDS currently gilteritinib on hold since September 14 is here for follow-up.  Patient continues to get 1 unit of PRBC transfusion; also 1 unit of platelet transfusion on a weekly basis.  Patient denies any bleeding at this time.  No fever no chills.  Appetite is fair.  She is currently status post physical therapy for her fractured right upper extremity.  Observation/objective: See below Assessment and plan: Acute myeloid leukemia not having achieved remission ([place]) #Acute myeloid leukemia with monocytic differentiation-currently second line GILTERITNIB-on hold since 9/14.  Currently receiving transfusion support. STABLE.   #Pancytopenia- LDH 452 (slightly rising); likely secondary to MDS/unlikely from recent therapy.  Recommend repeating a bone marrow biopsy for further evaluation.   # Today's labs reviewed with patient in detail-WBC 3.3;  ANC 0.4. Slightly Hemoglobin 9.3 (stable), platelets 10 (stable).  Proceed with nivestim; single donor platelet transfusion/1 unit.     - Continue to hold Gilteritinib 80 mg daily.  Will consider starting back at 1 pill (40 mg) once a day once counts recover/await bone marrow biopsy as above  #History of mechanical fall with intracranial bleed-minor bleed.  No seizures.  Stable.   #Supportive care for AML-  -Continue Noxafil/posaconazole 167m and acyclovir 400 mg, and levaquin 250 mg for prophylaxis. -Continue PICC line dressing changes weekly  Disposition: # Transfuse 1 unit of platelets 11/03; Nivestym-today. # 10/05- cbc/LDH; hold tube; possible 1 unit platelet/1 u PRBC transfusion. NO Nivestim.   # Bone marrow Biopsy ASAP/next week for sure.  # Follow up- MD/labs- TBD [based on bone marrow schedule]- Dr.B  Follow-up instructions:  I discussed the  assessment and treatment plan with the patient.  The patient was provided an opportunity to ask questions and all were answered.  The patient agreed with the plan and demonstrated understanding of instructions.  The patient was advised to call back or seek an in person evaluation if the symptoms worsen or if the condition fails to improve as anticipated.   Dr. GCharlaine DaltonCBuckhornat [facility]11/07/2018 2:52 PM

## 2019-04-14 NOTE — Assessment & Plan Note (Addendum)
#  Acute myeloid leukemia with monocytic differentiation-currently second line GILTERITNIB-on hold since 9/14.  Currently receiving transfusion support. STABLE.   #Pancytopenia- LDH 452 (slightly rising); likely secondary to MDS/unlikely from recent therapy.  Recommend repeating a bone marrow biopsy for further evaluation.   # Today's labs reviewed with patient in detail-WBC 3.3;  ANC 0.4. Slightly Hemoglobin 9.3 (stable), platelets 10 (stable).  Proceed with nivestim; single donor platelet transfusion/1 unit.     - Continue to hold Gilteritinib 80 mg daily.  Will consider starting back at 1 pill (40 mg) once a day once counts recover/await bone marrow biopsy as above  #History of mechanical fall with intracranial bleed-minor bleed.  No seizures.  Stable.   #Supportive care for AML-  -Continue Noxafil/posaconazole '100mg'$  and acyclovir 400 mg, and levaquin 250 mg for prophylaxis. -Continue PICC line dressing changes weekly  Disposition: # Transfuse 1 unit of platelets 11/03; Nivestym-today. # 10/05- cbc/LDH; hold tube; possible 1 unit platelet/1 u PRBC transfusion. NO Nivestim.   # Bone marrow Biopsy ASAP/next week for sure.  # Follow up- MD/labs- TBD [based on bone marrow schedule]- Dr.B

## 2019-04-15 ENCOUNTER — Ambulatory Visit: Payer: Medicare Other | Admitting: Internal Medicine

## 2019-04-15 ENCOUNTER — Other Ambulatory Visit: Payer: Self-pay

## 2019-04-15 ENCOUNTER — Inpatient Hospital Stay: Payer: Medicare Other

## 2019-04-15 ENCOUNTER — Other Ambulatory Visit: Payer: Medicare Other

## 2019-04-15 VITALS — BP 118/70 | HR 70 | Temp 98.0°F | Resp 18

## 2019-04-15 DIAGNOSIS — C9200 Acute myeloblastic leukemia, not having achieved remission: Secondary | ICD-10-CM | POA: Diagnosis not present

## 2019-04-15 DIAGNOSIS — D469 Myelodysplastic syndrome, unspecified: Secondary | ICD-10-CM

## 2019-04-15 MED ORDER — HEPARIN SOD (PORK) LOCK FLUSH 100 UNIT/ML IV SOLN
250.0000 [IU] | Freq: Once | INTRAVENOUS | Status: AC
  Administered 2019-04-15: 250 [IU] via INTRAVENOUS

## 2019-04-15 MED ORDER — SODIUM CHLORIDE 0.9% FLUSH
10.0000 mL | INTRAVENOUS | Status: AC | PRN
  Administered 2019-04-15: 10 mL
  Filled 2019-04-15: qty 10

## 2019-04-15 MED ORDER — DIPHENHYDRAMINE HCL 25 MG PO CAPS
25.0000 mg | ORAL_CAPSULE | Freq: Once | ORAL | Status: AC
  Administered 2019-04-15: 10:00:00 25 mg via ORAL
  Filled 2019-04-15: qty 1

## 2019-04-15 MED ORDER — HEPARIN SOD (PORK) LOCK FLUSH 100 UNIT/ML IV SOLN
INTRAVENOUS | Status: AC
  Filled 2019-04-15: qty 5

## 2019-04-15 MED ORDER — SODIUM CHLORIDE 0.9% IV SOLUTION
250.0000 mL | Freq: Once | INTRAVENOUS | Status: AC
  Administered 2019-04-15: 250 mL via INTRAVENOUS
  Filled 2019-04-15: qty 250

## 2019-04-15 MED ORDER — ACETAMINOPHEN 325 MG PO TABS
650.0000 mg | ORAL_TABLET | Freq: Once | ORAL | Status: AC
  Administered 2019-04-15: 650 mg via ORAL
  Filled 2019-04-15: qty 2

## 2019-04-15 MED ORDER — FILGRASTIM-AAFI 300 MCG/0.5ML IJ SOSY
300.0000 ug | PREFILLED_SYRINGE | Freq: Once | INTRAMUSCULAR | Status: AC
  Administered 2019-04-15: 300 ug via SUBCUTANEOUS
  Filled 2019-04-15: qty 0.5

## 2019-04-16 ENCOUNTER — Other Ambulatory Visit: Payer: Self-pay

## 2019-04-16 LAB — PREPARE PLATELET PHERESIS: Unit division: 0

## 2019-04-16 LAB — BPAM PLATELET PHERESIS
Blood Product Expiration Date: 202011052359
ISSUE DATE / TIME: 202011031054
Unit Type and Rh: 5100

## 2019-04-17 ENCOUNTER — Other Ambulatory Visit: Payer: Self-pay | Admitting: *Deleted

## 2019-04-17 ENCOUNTER — Other Ambulatory Visit: Payer: Self-pay

## 2019-04-17 ENCOUNTER — Inpatient Hospital Stay: Payer: Medicare Other | Admitting: *Deleted

## 2019-04-17 ENCOUNTER — Inpatient Hospital Stay: Payer: Medicare Other

## 2019-04-17 DIAGNOSIS — Z95828 Presence of other vascular implants and grafts: Secondary | ICD-10-CM

## 2019-04-17 DIAGNOSIS — C9200 Acute myeloblastic leukemia, not having achieved remission: Secondary | ICD-10-CM | POA: Diagnosis not present

## 2019-04-17 LAB — CBC WITH DIFFERENTIAL/PLATELET
Abs Immature Granulocytes: 0.1 10*3/uL — ABNORMAL HIGH (ref 0.00–0.07)
Basophils Absolute: 0 10*3/uL (ref 0.0–0.1)
Basophils Relative: 0 %
Blasts: 22 %
Eosinophils Absolute: 0 10*3/uL (ref 0.0–0.5)
Eosinophils Relative: 0 %
HCT: 27.2 % — ABNORMAL LOW (ref 36.0–46.0)
Hemoglobin: 8.6 g/dL — ABNORMAL LOW (ref 12.0–15.0)
Lymphocytes Relative: 30 %
Lymphs Abs: 2.4 10*3/uL (ref 0.7–4.0)
MCH: 27.4 pg (ref 26.0–34.0)
MCHC: 31.6 g/dL (ref 30.0–36.0)
MCV: 86.6 fL (ref 80.0–100.0)
Monocytes Absolute: 1.9 10*3/uL — ABNORMAL HIGH (ref 0.1–1.0)
Monocytes Relative: 23 %
Neutro Abs: 1.9 10*3/uL (ref 1.7–7.7)
Neutrophils Relative %: 24 %
Platelets: 13 10*3/uL — CL (ref 150–400)
Promyelocytes Relative: 1 %
RBC: 3.14 MIL/uL — ABNORMAL LOW (ref 3.87–5.11)
RDW: 19.7 % — ABNORMAL HIGH (ref 11.5–15.5)
Smear Review: DECREASED
WBC: 8.1 10*3/uL (ref 4.0–10.5)
nRBC: 0.9 % — ABNORMAL HIGH (ref 0.0–0.2)

## 2019-04-17 LAB — SAMPLE TO BLOOD BANK

## 2019-04-17 LAB — LACTATE DEHYDROGENASE: LDH: 540 U/L — ABNORMAL HIGH (ref 98–192)

## 2019-04-17 LAB — PATHOLOGIST SMEAR REVIEW

## 2019-04-17 MED ORDER — ACETAMINOPHEN 325 MG PO TABS
650.0000 mg | ORAL_TABLET | Freq: Once | ORAL | Status: AC
  Administered 2019-04-17: 10:00:00 650 mg via ORAL
  Filled 2019-04-17: qty 2

## 2019-04-17 MED ORDER — HEPARIN SOD (PORK) LOCK FLUSH 100 UNIT/ML IV SOLN
500.0000 [IU] | Freq: Once | INTRAVENOUS | Status: AC
  Administered 2019-04-17: 12:00:00 500 [IU] via INTRAVENOUS
  Filled 2019-04-17: qty 5

## 2019-04-17 MED ORDER — SODIUM CHLORIDE 0.9% IV SOLUTION
250.0000 mL | Freq: Once | INTRAVENOUS | Status: AC
  Administered 2019-04-17: 10:00:00 250 mL via INTRAVENOUS
  Filled 2019-04-17: qty 250

## 2019-04-17 MED ORDER — DIPHENHYDRAMINE HCL 25 MG PO CAPS
25.0000 mg | ORAL_CAPSULE | Freq: Once | ORAL | Status: AC
  Administered 2019-04-17: 25 mg via ORAL
  Filled 2019-04-17: qty 1

## 2019-04-17 MED ORDER — SODIUM CHLORIDE 0.9% FLUSH
10.0000 mL | Freq: Once | INTRAVENOUS | Status: AC
  Administered 2019-04-17: 08:00:00 10 mL via INTRAVENOUS
  Filled 2019-04-17: qty 10

## 2019-04-18 ENCOUNTER — Other Ambulatory Visit: Payer: Self-pay | Admitting: *Deleted

## 2019-04-18 ENCOUNTER — Other Ambulatory Visit: Payer: Self-pay | Admitting: Physician Assistant

## 2019-04-18 DIAGNOSIS — C9200 Acute myeloblastic leukemia, not having achieved remission: Secondary | ICD-10-CM

## 2019-04-18 LAB — PREPARE PLATELET PHERESIS: Unit division: 0

## 2019-04-18 LAB — BPAM PLATELET PHERESIS
Blood Product Expiration Date: 202011082359
ISSUE DATE / TIME: 202011051136
Unit Type and Rh: 5100

## 2019-04-18 NOTE — Progress Notes (Signed)
Patient on schedule for BMB 04/21/2019. Spoke with patient and husband over phone with pre procedure instructions given/questions answered. Patient aware to be here @ 0730, NPO after MN prior to procedure. Stated understanding.

## 2019-04-21 ENCOUNTER — Other Ambulatory Visit: Payer: Self-pay

## 2019-04-21 ENCOUNTER — Inpatient Hospital Stay: Payer: Medicare Other

## 2019-04-21 ENCOUNTER — Ambulatory Visit
Admission: RE | Admit: 2019-04-21 | Discharge: 2019-04-21 | Disposition: A | Discharge status: Home / Self Care | Payer: Medicare Other | Source: Ambulatory Visit | Attending: Internal Medicine | Admitting: Internal Medicine

## 2019-04-21 DIAGNOSIS — Z79899 Other long term (current) drug therapy: Secondary | ICD-10-CM | POA: Insufficient documentation

## 2019-04-21 DIAGNOSIS — E785 Hyperlipidemia, unspecified: Secondary | ICD-10-CM | POA: Diagnosis not present

## 2019-04-21 DIAGNOSIS — C9200 Acute myeloblastic leukemia, not having achieved remission: Secondary | ICD-10-CM | POA: Insufficient documentation

## 2019-04-21 DIAGNOSIS — I10 Essential (primary) hypertension: Secondary | ICD-10-CM | POA: Insufficient documentation

## 2019-04-21 LAB — CBC WITH DIFFERENTIAL/PLATELET
Abs Immature Granulocytes: 1.9 10*3/uL — ABNORMAL HIGH (ref 0.00–0.07)
Band Neutrophils: 1 %
Basophils Absolute: 0 10*3/uL (ref 0.0–0.1)
Basophils Relative: 0 %
Eosinophils Absolute: 0 10*3/uL (ref 0.0–0.5)
Eosinophils Relative: 0 %
HCT: 28.1 % — ABNORMAL LOW (ref 36.0–46.0)
Hemoglobin: 8.9 g/dL — ABNORMAL LOW (ref 12.0–15.0)
Lymphocytes Relative: 24 %
Lymphs Abs: 4.5 10*3/uL — ABNORMAL HIGH (ref 0.7–4.0)
MCH: 26.9 pg (ref 26.0–34.0)
MCHC: 31.7 g/dL (ref 30.0–36.0)
MCV: 84.9 fL (ref 80.0–100.0)
Metamyelocytes Relative: 7 %
Monocytes Absolute: 6.5 10*3/uL — ABNORMAL HIGH (ref 0.1–1.0)
Monocytes Relative: 35 %
Myelocytes: 3 %
Neutro Abs: 1.7 10*3/uL (ref 1.7–7.7)
Neutrophils Relative %: 8 %
Other: 22 %
Platelets: 15 10*3/uL — CL (ref 150–400)
RBC: 3.31 MIL/uL — ABNORMAL LOW (ref 3.87–5.11)
RDW: 19.2 % — ABNORMAL HIGH (ref 11.5–15.5)
WBC: 18.7 10*3/uL — ABNORMAL HIGH (ref 4.0–10.5)
nRBC: 0.5 % — ABNORMAL HIGH (ref 0.0–0.2)
nRBC: 2 /100 WBC — ABNORMAL HIGH

## 2019-04-21 LAB — PROTIME-INR
INR: 1.1 (ref 0.8–1.2)
Prothrombin Time: 14.3 seconds (ref 11.4–15.2)

## 2019-04-21 MED ORDER — ACETAMINOPHEN 325 MG PO TABS
650.0000 mg | ORAL_TABLET | Freq: Once | ORAL | Status: AC
  Administered 2019-04-21: 11:00:00 650 mg via ORAL
  Filled 2019-04-21: qty 2

## 2019-04-21 MED ORDER — SODIUM CHLORIDE 0.9 % IV SOLN
INTRAVENOUS | Status: DC
  Administered 2019-04-21: 08:00:00 via INTRAVENOUS

## 2019-04-21 MED ORDER — DIPHENHYDRAMINE HCL 25 MG PO CAPS
25.0000 mg | ORAL_CAPSULE | Freq: Once | ORAL | Status: AC
  Administered 2019-04-21: 25 mg via ORAL
  Filled 2019-04-21: qty 1

## 2019-04-21 MED ORDER — FENTANYL CITRATE (PF) 100 MCG/2ML IJ SOLN
INTRAMUSCULAR | Status: AC | PRN
  Administered 2019-04-21 (×2): 25 ug via INTRAVENOUS

## 2019-04-21 MED ORDER — HEPARIN SOD (PORK) LOCK FLUSH 100 UNIT/ML IV SOLN
250.0000 [IU] | Freq: Once | INTRAVENOUS | Status: AC
  Administered 2019-04-21: 12:00:00 250 [IU] via INTRAVENOUS
  Filled 2019-04-21: qty 5

## 2019-04-21 MED ORDER — HYDROCODONE-ACETAMINOPHEN 5-325 MG PO TABS
1.0000 | ORAL_TABLET | ORAL | Status: DC | PRN
  Filled 2019-04-21: qty 2

## 2019-04-21 MED ORDER — SODIUM CHLORIDE 0.9% IV SOLUTION
250.0000 mL | Freq: Once | INTRAVENOUS | Status: AC
  Administered 2019-04-21: 11:00:00 250 mL via INTRAVENOUS
  Filled 2019-04-21: qty 250

## 2019-04-21 MED ORDER — MIDAZOLAM HCL 2 MG/2ML IJ SOLN
INTRAMUSCULAR | Status: AC | PRN
  Administered 2019-04-21: 1 mg via INTRAVENOUS

## 2019-04-21 MED ORDER — FENTANYL CITRATE (PF) 100 MCG/2ML IJ SOLN
INTRAMUSCULAR | Status: AC
  Filled 2019-04-21: qty 2

## 2019-04-21 MED ORDER — MIDAZOLAM HCL 2 MG/2ML IJ SOLN
INTRAMUSCULAR | Status: AC
  Filled 2019-04-21: qty 2

## 2019-04-21 MED ORDER — HEPARIN SOD (PORK) LOCK FLUSH 100 UNIT/ML IV SOLN
INTRAVENOUS | Status: AC
  Filled 2019-04-21: qty 5

## 2019-04-21 NOTE — Procedures (Signed)
  Procedure: CT bone marrow biopsy   EBL:   minimal Complications:  none immediate  See full dictation in [facility] PACS.  D. Adam Sanjuan MD Main # [phone] Pager  [phone]    

## 2019-04-21 NOTE — Progress Notes (Signed)
Patient clinically stable post BMB per Dr Vernard Gambles, tolerated well. Denies complaints at this time. bandade dressing dry and intact to posterior sacral area. Alert and oriented post procedure. Taking po's without difficulty. Received Versed 1mg  along with Fentanyl 61mcg IV for procedure.Report given to [facility] with questions answered post procedure.

## 2019-04-21 NOTE — H&P (Signed)
Chief Complaint: Patient was seen in consultation today for severe anemia in the setting of acute myeloid leukemia  Referring Physician(s): Cammie Sickle  Supervising Physician: Arne Cleveland  Patient Status: [facility] - Out-pt  History of Present Illness: Kathryn Hahn is a 85 y.o. female with past medical history of breast cancer s/p lumpectomy, HTN, and acute myeloid leukemia with severe anemia who presents to [facility] for bone marrow biopsy.  She has had bone marrow biopsies performed in the past and is familiar with procedure and recovery.  Her most recent biopsy was 12/30/18.  A repeat biopsy is requested by Dr. Rogue Bussing due pancytopenia with elevated LDH.  [name] presents today in her usual state of health.  She denies new concerns.  She has been NPO this AM and does not take blood thinners.   Past Medical History:  Diagnosis Date  . Anemia   . Arthritis   . Breast cancer ([place]) 2003   RT LUMPECTOMY  . Edema leg   . History of breast cancer 2003   post lumpectomy  . History of kidney stones   . Hyperlipemia, mixed   . Hypertension, essential   . Leukemia ([place])   . Leukocytosis   . MDS (myelodysplastic syndrome) ([place])   . Personal history of radiation therapy 2003   BREAST CA  . Renal stones   . Vitamin D deficiency     Past Surgical History:  Procedure Laterality Date  . BREAST BIOPSY Right 2003   Postive for cancer  . BREAST LUMPECTOMY Right 2003   BREAST CA  . KIDNEY STONE SURGERY Right     Allergies: Terbinafine  Medications: Prior to Admission medications   Medication Sig Start Date End Date Taking? Authorizing Provider  acetaminophen (TYLENOL) 325 MG tablet Take by mouth.   Yes [provider]  acyclovir (ZOVIRAX) 400 MG tablet ONE PILL A DAY [TO PREVENT SHINGLES] 03/07/19  Yes Cammie Sickle, MD  atenolol (TENORMIN) 50 MG tablet Take 1 tablet by mouth 2 (two) times daily.   Yes [provider]   calcium carbonate (OSCAL) 1500 (600 Ca) MG TABS tablet Take 600 mg of elemental calcium by mouth daily with breakfast.   Yes [provider]  Cholecalciferol (VITAMIN D) 2000 units tablet Take 1 tablet by mouth daily.   Yes [provider]  Gilteritinib Fumarate (XOSPATA) 40 MG TABS Take 80 mg by mouth daily. 02/07/19  Yes Charlaine Dalton R, MD  heparin lock flush 100 UNIT/ML SOLN injection Inject into the vein.   Yes [provider]  [place] (DERMACLOUD) CREA Apply topically.   Yes [provider]  levofloxacin (LEVAQUIN) 250 MG tablet Take 1 tablet (250 mg total) by mouth daily. 12/12/18  Yes Cammie Sickle, MD  losartan-hydrochlorothiazide (HYZAAR) 50-12.5 MG tablet Take 1 tablet by mouth daily.   Yes [provider]  magic mouthwash w/lidocaine SOLN Take 5 mLs by mouth 4 (four) times daily as needed for mouth pain. 11/11/18  Yes Cammie Sickle, MD  magnesium hydroxide (MILK OF MAGNESIA) 400 MG/5ML suspension Take 5 mLs by mouth every 4 (four) hours as needed.   Yes [provider]  ondansetron (ZOFRAN) 8 MG tablet Take by mouth every 8 (eight) hours as needed for nausea or vomiting.   Yes [provider]  polyethylene glycol (MIRALAX / GLYCOLAX) 17 g packet Take 17 g by mouth daily.   Yes [provider]  posaconazole (NOXAFIL) 100 MG TBEC delayed-release tablet  Take 2 tablets (200 mg total) by mouth daily. DO NOT START UNTIL-OK with MD. 12/19/18  Yes Cammie Sickle, MD  potassium chloride SA (K-DUR) 20 MEQ tablet Take 1 tablet (20 mEq total) by mouth 2 (two) times daily. 02/05/19  Yes Jacquelin Hawking, NP  senna (SENOKOT) 8.6 MG tablet Take 2 tablets by mouth 2 (two) times daily.   Yes [provider]  oxycodone (OXY-IR) 5 MG capsule Take 1 capsule (5 mg total) by mouth every 4 (four) hours as needed. 01/14/19   Mayo, Pete Pelt, MD     Family History  Problem Relation Age of Onset  .  Stroke Mother   . Hypertension Father   . Stroke Father   . Breast cancer Neg Hx     Social History   Socioeconomic History  . Marital status: Married    Spouse name: Not on file  . Number of children: Not on file  . Years of education: Not on file  . Highest education level: Not on file  Occupational History  . Not on file  Social Needs  . Financial resource strain: Not on file  . Food insecurity    Worry: Not on file    Inability: Not on file  . Transportation needs    Medical: Not on file    Non-medical: Not on file  Tobacco Use  . Smoking status: Never Smoker  . Smokeless tobacco: Never Used  Substance and Sexual Activity  . Alcohol use: Yes    Alcohol/week: 3.0 standard drinks    Types: 3 Glasses of wine per week  . Drug use: No  . Sexual activity: Not on file  Lifestyle  . Physical activity    Days per week: Not on file    Minutes per session: Not on file  . Stress: Not on file  Relationships  . Social Herbalist on phone: Not on file    Gets together: Not on file    Attends religious service: Not on file    Active member of club or organization: Not on file    Attends meetings of clubs or organizations: Not on file    Relationship status: Not on file  Other Topics Concern  . Not on file  Social History Narrative    No smoking/alcohol; lives in [place] with family.  She lives in assisted living with her husband.     Review of Systems: A 12 point ROS discussed and pertinent positives are indicated in the HPI above.  All other systems are negative.  Review of Systems  Constitutional: Negative for fatigue and fever.  Respiratory: Negative for cough and shortness of breath.   Cardiovascular: Negative for chest pain.  Gastrointestinal: Negative for abdominal pain, nausea and vomiting.  Genitourinary: Negative for dysuria.  Musculoskeletal: Negative for back pain.  Psychiatric/Behavioral: Negative for behavioral problems and confusion.     Vital Signs: BP 137/73   Temp 98.2 F (36.8 C) (Oral)   Resp 20   Ht _0  (1.448 m)   Wt 90 lb (40.8 kg)   SpO2 100%   BMI 19.48 kg/m   Physical Exam Vitals signs and nursing note reviewed.  Constitutional:      General: She is not in acute distress.    Appearance: She is not ill-appearing.  HENT:     Mouth/Throat:     Mouth: Mucous membranes are moist.     Pharynx: Oropharynx is clear.  Cardiovascular:  Rate and Rhythm: Normal rate and regular rhythm.     Pulses: Normal pulses.     Heart sounds: No murmur.  Pulmonary:     Effort: Pulmonary effort is normal. No respiratory distress.     Breath sounds: Normal breath sounds.  Abdominal:     General: Abdomen is flat.     Palpations: Abdomen is soft.  Skin:    General: Skin is warm and dry.  Neurological:     General: No focal deficit present.     Mental Status: She is alert and oriented to person, place, and time. Mental status is at baseline.  Psychiatric:        Mood and Affect: Mood normal.        Behavior: Behavior normal.        Thought Content: Thought content normal.        Judgment: Judgment normal.      MD Evaluation Airway: WNL Heart: WNL Abdomen: WNL Chest/ Lungs: WNL ASA  Classification: 3 Mallampati/Airway Score: One   Imaging: Dg Chest 2 View  Result Date: 04/02/2019 CLINICAL DATA:  Right sided chest pain x July. Some wheezing and sob. Hx HTN, breast lumpectomy in 2002. EXAM: CHEST - 2 VIEW COMPARISON:  01/14/2019 FINDINGS: Interval resolution of the airspace opacities seen previously. Lungs are clear. Heart size and mediastinal contours are within normal limits. Aortic Atherosclerosis (ICD10-170.0). No effusion. No pneumothorax. Multiple healing right rib fractures. Surgical clips in the right axilla. Fracture deformity of the proximal right humerus. Stable left PICC line to the mid SVC. Mild superior endplate T9 compression deformity, stable since 01/10/2019. IMPRESSION: 1. No acute  cardiopulmonary disease. 2. Multiple healing right rib fractures. 3. Stable T9 compression deformity. Electronically Signed   By: Lucrezia Europe M.D.   On: 04/02/2019 11:01    Labs:  CBC: Recent Labs    04/11/19 0851 04/14/19 0905 04/17/19 0810 04/21/19 0801  WBC 3.1* 3.0* 8.1 18.7*  HGB 8.9* 9.3* 8.6* 8.9*  HCT 28.1* 29.3* 27.2* 28.1*  PLT 19* 10* 13* 15*    COAGS: Recent Labs    12/24/18 0754 01/10/19 1418 04/21/19 0801  INR 1.1 1.2 1.1  APTT  --  38*  --     BMP: Recent Labs    03/24/19 0854 03/31/19 0830 04/07/19 0827 04/14/19 0905  NA 135 136 134* 137  K 3.9 4.5 3.8 3.5  CL 103 102 100 100  CO2 _0 GLUCOSE 141* 119* 167* 191*  BUN 22 26* 22 23  CALCIUM 9.0 9.3 9.1 9.2  CREATININE 0.41* 0.47 0.50 0.77  GFRNONAA >60 >60 >60 >60  GFRAA >60 >60 >60 >60    LIVER FUNCTION TESTS: Recent Labs    03/24/19 0854 03/31/19 0830 04/07/19 0827 04/14/19 0905  BILITOT 0.7 0.7 0.7 0.5  AST _1 ALT _2 ALKPHOS 88 79 75 74  PROT 6.5 6.5 6.1* 6.5  ALBUMIN 3.8 3.7 3.6 3.8    TUMOR MARKERS: No results for input(s): AFPTM, CEA, CA199, CHROMGRNA in the last 8760 hours.  Assessment and Plan: Patient with past medical history of acute myeloid leukemia presents with complaint of severe anemia, pancytopenia, elevated LDH.  IR consulted for bone marrow biopsy at the request of Dr. Rogue Bussing. Case reviewed by Dr. Vernard Gambles who approves patient for procedure.  Patient presents today in their usual state of health.  She has been NPO and is not currently on blood thinners.  Risks and benefits of bone marrow biopsy was discussed with the patient and/or patient's family including, but not limited to bleeding, infection, damage to adjacent structures or low yield requiring additional tests.  All of the questions were answered and there is agreement to proceed.  Consent signed and in chart.  Thank you for this interesting consult.  I greatly  enjoyed meeting Kathryn Hahn and look forward to participating in their care.  A copy of this report was sent to the requesting provider on this date.  Electronically Signed: Docia Barrier, PA 04/21/2019, 8:40 AM   I spent a total of  30 Minutes   in face to face in clinical consultation, greater than 50% of which was counseling/coordinating care for acute myeloid leukemia, severe anemia.

## 2019-04-22 LAB — PREPARE PLATELET PHERESIS: Unit division: 0

## 2019-04-22 LAB — BPAM PLATELET PHERESIS
Blood Product Expiration Date: 202011112359
ISSUE DATE / TIME: 202011091122
Unit Type and Rh: 6200

## 2019-04-22 LAB — SURGICAL PATHOLOGY

## 2019-04-23 ENCOUNTER — Encounter: Payer: Self-pay | Admitting: Internal Medicine

## 2019-04-23 ENCOUNTER — Other Ambulatory Visit: Payer: Self-pay

## 2019-04-23 ENCOUNTER — Telehealth: Payer: Self-pay | Admitting: Internal Medicine

## 2019-04-23 NOTE — Telephone Encounter (Signed)
Spoke to patient regarding the results of the bone marrow biopsy-recurrent leukemia; NGS pending/ordered by pathology discussed with Dr. Tresa Moore.  Restart-gilteritinib 1 pill a day [40 mg]; will ramp up based on response.  We will keep appointment as planned for tomorrow.  FYI-H/T

## 2019-04-23 NOTE — Progress Notes (Signed)
[place] OFFICE PROGRESS NOTE  Patient Care Team: Rusty Aus, MD as PCP - General (Internal Medicine)  CHIEF COMPLAINTS/PURPOSE OF CONSULTATION: Acute myeloid leukemia   Oncology History Overview Note  # SEP 2017- MYELOPROLIFERATIVE NEOPLASM- [WBC- 20; normal Hb/platelets] hypercellular bone marrow 90-95% proliferation of myeloid cells in various stages of maturation; relative erythroid hypoplasia and proliferation of atypical megakaryocytes; no increase in blasts; peripheral blood Bcr-Abl-NEG; Cytogenetics- WNL. NEG- Jak-2/MPL/CALR [place] limited- mild splenomegaly [~10cm; 463cm3]; OCT 2017- second opinion at [facility]. Surveillance.   # With progressive leukocytosis we evaluated her a month ago and sent BM for exam. She has a progressive leukocytosis so hydrea '500mg'$  daily was started on 10/20/2016 and increased to 2gm daily then back down to one daily, then 5 days per week over time due to counts drop. Then we held her hydrea since 04/09/2018 due to continued declining Hb and Plt.s  BMB 06/24/18 showed markedly hypercellular BM 95% with myeloid hyperplasia and atypical megakaryocytic hyperplasia. No significant increase in blasts. 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 BCR / ABL negative, FISH normal. Flow Showed 2%CD34-positive myeloid blasts. Myeloid precursors with low side scatter. Pathogenic variants were detected in the ASXL1, CCND2, CUX1, and U2AF1 genes.  # March 2020- wbc- 14/ Hb 9.5/ platelets- 54.   ---------------------------------------------------   # June 8th 2020- Acute myeloid leukemia [peripheral blood flow cytometry;NGS- pending]- June 15th Vidaza [SQ 1-5 + Venatoclax- #1in pt]; tumor lysis prophylaxis  # Day #28 bone marrow biopsy-  7/14- BMx- NO BLASTS; positive for dysplastic changes. OFF Venatoclax [since 7/17] sec to cytopenias.  #817 cycle #2 Vidaza+ venetoclax 100 mg; stop venetoclax on  8/24 [severe pancytopenia]  # 02/18/2019-start gilteritinib 80 mg a day; HELD on 9/14 [sec to severe pan]  # NOV 9th/2020-BMBx- RELAPSED AML; NOV 11th- STARTED GILTERITINIB '40mg'$ /day  # AUG 2020- AST elevation- 248-?sec to Posonoazole; RESOLVED; continue at lower dose 100 mg/day.   # 2002 [[place]] BREAST CA s/p Lumpect RT; [? Stage I] no chemo s/p AI  # F-ONE HEM: FLT3 ALTERATION NOTED  DIAGNOSIS: ACUTE MYELOID LEUKEMIA GOALS: pallaitive  CURRENT/MOST RECENT THERAPY : Gilteritinib     MDS (myelodysplastic syndrome) ([place]) (Resolved)  08/28/2018 Initial Diagnosis   MDS (myelodysplastic syndrome) ([place])   Acute myeloid leukemia not having achieved remission ([place])  11/25/2018 Initial Diagnosis   AML (acute myeloid leukemia) with failed remission ([place])   11/25/2018 - 12/22/2018 Chemotherapy   The patient had azaCITIDine (VIDAZA) chemo injection 105 mg, 75 mg/m2 = 105 mg, Subcutaneous,  Once, 1 of 4 cycles Administration: 105 mg (11/25/2018), 105 mg (11/26/2018), 105 mg (11/27/2018), 105 mg (11/28/2018), 105 mg (11/29/2018)  for chemotherapy treatment.    01/27/2019 -  Chemotherapy   The patient had palonosetron (ALOXI) injection 0.25 mg, 0.25 mg, Intravenous,  Once, 1 of 4 cycles Administration: 0.25 mg (01/27/2019), 0.25 mg (01/29/2019), 0.25 mg (01/31/2019) azaCITIDine (VIDAZA) 100 mg in sodium chloride 0.9 % 50 mL chemo infusion, 75 mg/m2 = 100 mg, Intravenous, Once, 1 of 4 cycles Administration: 100 mg (01/27/2019), 100 mg (01/28/2019), 100 mg (01/29/2019), 100 mg (01/30/2019), 100 mg (01/31/2019)  for chemotherapy treatment.      HISTORY OF PRESENTING ILLNESS:  Kathryn Hahn 85 y.o.  female with acute myeloid leukemia with monocytic differentiation, currently on surveillance/off any therapies [thought to be from drug-related low counts] is here for follow-up/review results of the bone marrow biopsy.  Patient's bone marrow biopsy was uneventful.  Patient denies any easy  bruising or  bleeding.  Fatigue.  Mild swelling in the legs.  No fevers or chills.    Review of Systems  Constitutional: Positive for malaise/fatigue (Improved). Negative for chills, diaphoresis, fever and weight loss.  HENT: Negative for nosebleeds and sore throat.   Eyes: Negative for double vision.  Respiratory: Positive for shortness of breath (Stable). Negative for cough, hemoptysis, sputum production and wheezing.   Cardiovascular: Positive for leg swelling (Stable). Negative for chest pain, palpitations and orthopnea.  Gastrointestinal: Negative for abdominal pain, blood in stool, constipation, diarrhea, heartburn, melena, nausea and vomiting.  Genitourinary: Negative for dysuria, frequency and urgency.  Musculoskeletal: Negative for back pain and joint pain.  Skin: Negative.  Negative for itching and rash.  Neurological: Negative for dizziness, tingling, focal weakness, weakness and headaches.  Endo/Heme/Allergies: Bruises/bleeds easily (Improved).  Psychiatric/Behavioral: Negative for depression. The patient is not nervous/anxious and does not have insomnia.     MEDICAL HISTORY:  Past Medical History:  Diagnosis Date  . Anemia   . Arthritis   . Breast cancer ([place]) 2003   RT LUMPECTOMY  . Edema leg   . History of breast cancer 2003   post lumpectomy  . History of kidney stones   . Hyperlipemia, mixed   . Hypertension, essential   . Leukemia ([place])   . Leukocytosis   . MDS (myelodysplastic syndrome) ([place])   . Personal history of radiation therapy 2003   BREAST CA  . Renal stones   . Vitamin D deficiency     SURGICAL HISTORY: Past Surgical History:  Procedure Laterality Date  . BREAST BIOPSY Right 2003   Postive for cancer  . BREAST LUMPECTOMY Right 2003   BREAST CA  . KIDNEY STONE SURGERY Right     SOCIAL HISTORY: Social History   Socioeconomic History  . Marital status: Married    Spouse name: Not on file  . Number of children: Not on file  . Years of education:  Not on file  . Highest education level: Not on file  Occupational History  . Not on file  Social Needs  . Financial resource strain: Not on file  . Food insecurity    Worry: Not on file    Inability: Not on file  . Transportation needs    Medical: Not on file    Non-medical: Not on file  Tobacco Use  . Smoking status: Never Smoker  . Smokeless tobacco: Never Used  Substance and Sexual Activity  . Alcohol use: Yes    Alcohol/week: 3.0 standard drinks    Types: 3 Glasses of wine per week  . Drug use: No  . Sexual activity: Not on file  Lifestyle  . Physical activity    Days per week: Not on file    Minutes per session: Not on file  . Stress: Not on file  Relationships  . Social Herbalist on phone: Not on file    Gets together: Not on file    Attends religious service: Not on file    Active member of club or organization: Not on file    Attends meetings of clubs or organizations: Not on file    Relationship status: Not on file  . Intimate partner violence    Fear of current or ex partner: Not on file    Emotionally abused: Not on file    Physically abused: Not on file    Forced sexual activity: Not on file  Other Topics Concern  . Not  on file  Social History Narrative    No smoking/alcohol; lives in [place] with family.  She lives in assisted living with her husband.    FAMILY HISTORY: Family History  Problem Relation Age of Onset  . Stroke Mother   . Hypertension Father   . Stroke Father   . Breast cancer Neg Hx     ALLERGIES:  is allergic to terbinafine.  MEDICATIONS:  Current Outpatient Medications  Medication Sig Dispense Refill  . acetaminophen (TYLENOL) 325 MG tablet Take by mouth.    Marland Kitchen acyclovir (ZOVIRAX) 400 MG tablet ONE PILL A DAY [TO PREVENT SHINGLES] 90 tablet 1  . atenolol (TENORMIN) 50 MG tablet Take 1 tablet by mouth 2 (two) times daily.    . calcium carbonate (OSCAL) 1500 (600 Ca) MG TABS tablet Take 600 mg of elemental calcium  by mouth daily with breakfast.    . Cholecalciferol (VITAMIN D) 2000 units tablet Take 1 tablet by mouth daily.    . Gilteritinib Fumarate (XOSPATA) 40 MG TABS Take 80 mg by mouth daily. 60 tablet 2  . heparin lock flush 100 UNIT/ML SOLN injection Inject into the vein.    . Infant Care Products (DERMACLOUD) CREA Apply topically.    Marland Kitchen levofloxacin (LEVAQUIN) 250 MG tablet Take 1 tablet (250 mg total) by mouth daily. 30 tablet 3  . losartan-hydrochlorothiazide (HYZAAR) 50-12.5 MG tablet Take 1 tablet by mouth daily.    . magic mouthwash w/lidocaine SOLN Take 5 mLs by mouth 4 (four) times daily as needed for mouth pain. 480 mL 3  . magnesium hydroxide (MILK OF MAGNESIA) 400 MG/5ML suspension Take 5 mLs by mouth every 4 (four) hours as needed.    . ondansetron (ZOFRAN) 8 MG tablet Take by mouth every 8 (eight) hours as needed for nausea or vomiting.    Marland Kitchen oxycodone (OXY-IR) 5 MG capsule Take 1 capsule (5 mg total) by mouth every 4 (four) hours as needed. 15 capsule 0  . polyethylene glycol (MIRALAX / GLYCOLAX) 17 g packet Take 17 g by mouth daily.    . posaconazole (NOXAFIL) 100 MG TBEC delayed-release tablet Take 2 tablets (200 mg total) by mouth daily. DO NOT START UNTIL-OK with MD. 60 tablet 3  . potassium chloride SA (K-DUR) 20 MEQ tablet Take 1 tablet (20 mEq total) by mouth 2 (two) times daily. 10 tablet 0  . senna (SENOKOT) 8.6 MG tablet Take 2 tablets by mouth 2 (two) times daily.    Marland Kitchen eltrombopag (PROMACTA) 50 MG tablet Take 1 tablet (50 mg total) by mouth daily. Take on an empty stomach 1 hour before a meal or 2 hours after 30 tablet 3   No current facility-administered medications for this visit.    Facility-Administered Medications Ordered in Other Visits  Medication Dose Route Frequency Provider Last Rate Last Dose  . 0.9 %  sodium chloride infusion   Intravenous Continuous Jacquelin Hawking, NP   Stopped at 02/05/19 1630    PHYSICAL EXAMINATION: ECOG PERFORMANCE STATUS: 1 -  Symptomatic but completely ambulatory  Vitals:   04/24/19 0845  BP: 135/77  Pulse: 68  Temp: (!) 97 F (36.1 C)   Filed Weights   04/24/19 0845  Weight: 92 lb (41.7 kg)    Physical Exam  Constitutional: She is oriented to person, place, and time and well-developed, well-nourished, and in no distress.  PICC line in place.  No active bleeding.  Masked.  In a wheelchair.  HENT:  Head: Normocephalic and atraumatic.  Mouth/Throat: Oropharynx is clear and moist. No oropharyngeal exudate.  Eyes: Conjunctivae are normal. No scleral icterus.  Neck: Normal range of motion. Neck supple.  Cardiovascular: Normal rate and regular rhythm.  Pulmonary/Chest: Effort normal. No respiratory distress.  Abdominal: Soft. Bowel sounds are normal. She exhibits no distension and no mass. There is no abdominal tenderness. There is no rebound and no guarding.  Musculoskeletal: Normal range of motion.        General: No tenderness or edema.     Comments: Sitting in wheelchair  Neurological: She is alert and oriented to person, place, and time.  Skin: Skin is warm.  Multiple bruises on lower legs, few on hands and arms. Torso bruising improved/mostly resolved  Psychiatric: Affect normal.    LABORATORY DATA:  I have reviewed the data as listed Lab Results  Component Value Date   WBC 30.4 (H) 04/24/2019   HGB 8.2 (L) 04/24/2019   HCT 25.9 (L) 04/24/2019   MCV 85.5 04/24/2019   PLT 14 (LL) 04/24/2019   Recent Labs    03/31/19 0830 04/07/19 0827 04/14/19 0905 04/24/19 0829  NA 136 134* 137 135  K 4.5 3.8 3.5 3.3*  CL 102 100 100 99  CO2 '25 25 23 25  '$ GLUCOSE 119* 167* 191* 138*  BUN 26* 22 23 30*  CREATININE 0.47 0.50 0.77 0.77  CALCIUM 9.3 9.1 9.2 9.4  GFRNONAA >60 >60 >60 >60  GFRAA >60 >60 >60 >60  PROT 6.5 6.1* 6.5  --   ALBUMIN 3.7 3.6 3.8  --   AST '19 21 25  '$ --   ALT '13 13 12  '$ --   ALKPHOS 79 75 74  --   BILITOT 0.7 0.7 0.5  --     RADIOGRAPHIC STUDIES: I have personally  reviewed the radiological images as listed and agreed with the findings in the report. Dg Chest 2 View  Result Date: 04/02/2019 CLINICAL DATA:  Right sided chest pain x July. Some wheezing and sob. Hx HTN, breast lumpectomy in 2002. EXAM: CHEST - 2 VIEW COMPARISON:  01/14/2019 FINDINGS: Interval resolution of the airspace opacities seen previously. Lungs are clear. Heart size and mediastinal contours are within normal limits. Aortic Atherosclerosis (ICD10-170.0). No effusion. No pneumothorax. Multiple healing right rib fractures. Surgical clips in the right axilla. Fracture deformity of the proximal right humerus. Stable left PICC line to the mid SVC. Mild superior endplate T9 compression deformity, stable since 01/10/2019. IMPRESSION: 1. No acute cardiopulmonary disease. 2. Multiple healing right rib fractures. 3. Stable T9 compression deformity. Electronically Signed   By: Lucrezia Europe M.D.   On: 04/02/2019 11:01   Ct Bone Marrow Biopsy & Aspiration  Result Date: 04/21/2019 CLINICAL DATA:  Anemia in the setting of AML EXAM: CT GUIDED DEEP ILIAC BONE ASPIRATION AND CORE BIOPSY TECHNIQUE: Patient was placed prone on the CT gantry and limited axial scans through the pelvis were obtained. Appropriate skin entry site was identified. Skin site was marked, prepped with chlorhexidine, draped in usual sterile fashion, and infiltrated locally with 1% lidocaine. Intravenous Fentanyl 34mg and Versed '1mg'$  were administered as conscious sedation during continuous monitoring of the patient's level of consciousness and physiological / cardiorespiratory status by the radiology RN, with a total moderate sedation time of 11 minutes. Under CT fluoroscopic guidance an 11-gauge Cook trocar bone needle was advanced into the right iliac bone just lateral to the sacroiliac joint. Once needle tip position was confirmed, core and aspiration samples were obtained, submitted to pathology for  approval. Patient tolerated procedure well.  COMPLICATIONS: COMPLICATIONS none IMPRESSION: 1. Technically successful CT guided right iliac bone core and aspiration biopsy. Electronically Signed   By: Lucrezia Europe M.D.   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  NGS-pending.  Discussed with Dr. Tresa Moore pathology.  #Restart gilteritinib-40 mg a day [given the previous significant bone marrow suppression]; escalate as tolerated.   #Leukocytosis/anemia/thrombocytopenia-White count 30,000/hemoglobin 8.3 platelets 14:  secondary to underlying acute myeloid leukemia.  Proceed with platelet transfusion today/upcoming weekend.  Given the need for frequent platelet transfusion/I think is reasonable to treat Promacta, discussed with the patient.  She is in agreement.   #Supportive care for AML-  -Continue Noxafil/posaconazole '100mg'$  and acyclovir 400 mg, and levaquin 250 mg for prophylaxis. -Continue PICC line dressing changes weekly  #Discussed above plan of care with patient.  Tried to reach the husband unable to connect.  Spoke to patient's brother-Dr. Clinton Sawyer at length.  Informed this is palliative/not curative treatment.  Also discussed with Dr. Stoney Bang.  Patient unfortunately has a poor prognosis.   Disposition: # Transfuse 1 unit of platelets today; NO PRBC  # 11/16- labs- cbc/ HOLD tube; possible platelets.  # Follow up-1 week MD/labs-cbc/bmp;LDH; possible 1 unit PRBC transfusion- Dr.B

## 2019-04-23 NOTE — Assessment & Plan Note (Addendum)
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  NGS-pending.  Discussed with Dr. Tresa Moore pathology.  #Restart gilteritinib-40 mg a day [given the previous significant bone marrow suppression]; escalate as tolerated.   #Leukocytosis/anemia/thrombocytopenia-White count 30,000/hemoglobin 8.3 platelets 14:  secondary to underlying acute myeloid leukemia.  Proceed with platelet transfusion today/upcoming weekend.  Given the need for frequent platelet transfusion/I think is reasonable to treat Promacta, discussed with the patient.  She is in agreement.   #Supportive care for AML-  -Continue Noxafil/posaconazole '100mg'$  and acyclovir 400 mg, and levaquin 250 mg for prophylaxis. -Continue PICC line dressing changes weekly  #Discussed above plan of care with patient.  Tried to reach the husband unable to connect.  Spoke to patient's brother-Dr. Clinton Sawyer at length.  Informed this is palliative/not curative treatment.  Also discussed with Dr. Stoney Bang.  Patient unfortunately has a poor prognosis.   Disposition: # Transfuse 1 unit of platelets today; NO PRBC  # 11/16- labs- cbc/ HOLD tube; possible platelets.  # Follow up-1 week MD/labs-cbc/bmp;LDH; possible 1 unit PRBC transfusion- Dr.B

## 2019-04-23 NOTE — Progress Notes (Signed)
Patient pre screened for office appointment, no questions or concerns today. Patient reminded of upcoming appointment time and date. 

## 2019-04-24 ENCOUNTER — Other Ambulatory Visit: Payer: Self-pay | Admitting: *Deleted

## 2019-04-24 ENCOUNTER — Inpatient Hospital Stay (HOSPITAL_BASED_OUTPATIENT_CLINIC_OR_DEPARTMENT_OTHER): Payer: Medicare Other | Admitting: Internal Medicine

## 2019-04-24 ENCOUNTER — Inpatient Hospital Stay: Payer: Medicare Other | Admitting: *Deleted

## 2019-04-24 ENCOUNTER — Other Ambulatory Visit: Payer: Self-pay

## 2019-04-24 ENCOUNTER — Inpatient Hospital Stay: Payer: Medicare Other

## 2019-04-24 ENCOUNTER — Encounter: Payer: Self-pay | Admitting: Internal Medicine

## 2019-04-24 DIAGNOSIS — C9200 Acute myeloblastic leukemia, not having achieved remission: Secondary | ICD-10-CM

## 2019-04-24 DIAGNOSIS — D649 Anemia, unspecified: Secondary | ICD-10-CM

## 2019-04-24 DIAGNOSIS — Z452 Encounter for adjustment and management of vascular access device: Secondary | ICD-10-CM

## 2019-04-24 LAB — CBC WITH DIFFERENTIAL/PLATELET
Abs Immature Granulocytes: 0.29 10*3/uL — ABNORMAL HIGH (ref 0.00–0.07)
Basophils Absolute: 0 10*3/uL (ref 0.0–0.1)
Basophils Relative: 0 %
Eosinophils Absolute: 0 10*3/uL (ref 0.0–0.5)
Eosinophils Relative: 0 %
HCT: 25.9 % — ABNORMAL LOW (ref 36.0–46.0)
Hemoglobin: 8.2 g/dL — ABNORMAL LOW (ref 12.0–15.0)
Immature Granulocytes: 1 %
Lymphocytes Relative: 13 %
Lymphs Abs: 3.8 10*3/uL (ref 0.7–4.0)
MCH: 27.1 pg (ref 26.0–34.0)
MCHC: 31.7 g/dL (ref 30.0–36.0)
MCV: 85.5 fL (ref 80.0–100.0)
Monocytes Absolute: 25.6 10*3/uL — ABNORMAL HIGH (ref 0.1–1.0)
Monocytes Relative: 84 %
Neutro Abs: 0.7 10*3/uL — ABNORMAL LOW (ref 1.7–7.7)
Neutrophils Relative %: 2 %
Platelets: 14 10*3/uL — CL (ref 150–400)
RBC: 3.03 MIL/uL — ABNORMAL LOW (ref 3.87–5.11)
RDW: 19 % — ABNORMAL HIGH (ref 11.5–15.5)
WBC Morphology: 63
WBC: 30.4 10*3/uL — ABNORMAL HIGH (ref 4.0–10.5)
nRBC: 0.3 % — ABNORMAL HIGH (ref 0.0–0.2)

## 2019-04-24 LAB — SAMPLE TO BLOOD BANK

## 2019-04-24 LAB — BASIC METABOLIC PANEL
Anion gap: 11 (ref 5–15)
BUN: 30 mg/dL — ABNORMAL HIGH (ref 8–23)
CO2: 25 mmol/L (ref 22–32)
Calcium: 9.4 mg/dL (ref 8.9–10.3)
Chloride: 99 mmol/L (ref 98–111)
Creatinine, Ser: 0.77 mg/dL (ref 0.44–1.00)
GFR calc Af Amer: >60 mL/min (ref >60)
GFR calc non Af Amer: >60 mL/min (ref >60)
Glucose, Bld: 138 mg/dL — ABNORMAL HIGH (ref 70–99)
Potassium: 3.3 mmol/L — ABNORMAL LOW (ref 3.5–5.1)
Sodium: 135 mmol/L (ref 135–145)

## 2019-04-24 LAB — LACTATE DEHYDROGENASE: LDH: 929 U/L — ABNORMAL HIGH (ref 98–192)

## 2019-04-24 MED ORDER — ACETAMINOPHEN 325 MG PO TABS
650.0000 mg | ORAL_TABLET | Freq: Once | ORAL | Status: AC
  Administered 2019-04-24: 10:00:00 650 mg via ORAL
  Filled 2019-04-24: qty 2

## 2019-04-24 MED ORDER — SODIUM CHLORIDE 0.9% FLUSH
10.0000 mL | Freq: Once | INTRAVENOUS | Status: AC
  Administered 2019-04-24: 10 mL via INTRAVENOUS
  Filled 2019-04-24: qty 10

## 2019-04-24 MED ORDER — SODIUM CHLORIDE 0.9% IV SOLUTION
250.0000 mL | Freq: Once | INTRAVENOUS | Status: AC
  Administered 2019-04-24: 10:00:00 250 mL via INTRAVENOUS
  Filled 2019-04-24: qty 250

## 2019-04-24 MED ORDER — DIPHENHYDRAMINE HCL 25 MG PO CAPS
25.0000 mg | ORAL_CAPSULE | Freq: Once | ORAL | Status: AC
  Administered 2019-04-24: 10:00:00 25 mg via ORAL
  Filled 2019-04-24: qty 1

## 2019-04-24 MED ORDER — HEPARIN SOD (PORK) LOCK FLUSH 100 UNIT/ML IV SOLN
500.0000 [IU] | Freq: Every day | INTRAVENOUS | Status: AC | PRN
  Administered 2019-04-24: 500 [IU]
  Filled 2019-04-24: qty 5

## 2019-04-25 ENCOUNTER — Telehealth: Payer: Self-pay | Admitting: Pharmacist

## 2019-04-25 ENCOUNTER — Other Ambulatory Visit: Payer: Self-pay | Admitting: *Deleted

## 2019-04-25 DIAGNOSIS — D649 Anemia, unspecified: Secondary | ICD-10-CM

## 2019-04-25 DIAGNOSIS — D696 Thrombocytopenia, unspecified: Secondary | ICD-10-CM

## 2019-04-25 LAB — BPAM PLATELET PHERESIS
Blood Product Expiration Date: 202011122359
ISSUE DATE / TIME: 202011121013
Unit Type and Rh: 6200

## 2019-04-25 LAB — PREPARE PLATELET PHERESIS: Unit division: 0

## 2019-04-25 MED ORDER — ELTROMBOPAG OLAMINE 50 MG PO TABS: 50.0000 mg | ORAL_TABLET | Freq: Every day | ORAL | 3 refills | Status: DC

## 2019-04-25 NOTE — Telephone Encounter (Signed)
Oral Oncology Pharmacist Encounter  Received new prescription for Promacta (eltrombopag) for the treatment of thrombocytopenia, planned duration until she stops responding or unacceptable drug toxicity.  CMP from 04/07/2019 assessed, no relevant lab abnormalities. Prescription dose and frequency assessed.   Current medication list in Epic reviewed, a few DDIs with eltrombopag identified: - Polyvalent cation containing products like calcium carbonate and milk of magnesium may decrease the serum concentration of eltrombopag. Manage by administer eltrombopag at least 2 hours before or 4 hours after oral administration of any polyvalent cation containing product.  Prescription has been e-scribed to the [facility] for benefits analysis and approval.  Oral Oncology Clinic will continue to follow for insurance authorization, copayment issues, initial counseling and start date.  Darl Pikes, PharmD, BCPS, [facility] Hematology/Oncology Clinical Pharmacist [facility] [place] [phone]  04/25/2019 8:49 AM

## 2019-04-25 NOTE — Telephone Encounter (Signed)
Oral Oncology Pharmacist Encounter   Received notification from [facility] that prior authorization for Promacta is required.   PA submitted on CMM Key [id number] Status is pending   Oral Oncology Clinic will continue to follow.   Darl Pikes, PharmD, BCPS, [facility] Hematology/Oncology Clinical Pharmacist [facility] [place] [facility] [phone]  04/25/2019 3:54 PM

## 2019-04-28 ENCOUNTER — Inpatient Hospital Stay: Payer: Medicare Other | Attending: Nurse Practitioner

## 2019-04-28 ENCOUNTER — Inpatient Hospital Stay: Payer: Medicare Other

## 2019-04-28 ENCOUNTER — Other Ambulatory Visit: Payer: Self-pay | Admitting: *Deleted

## 2019-04-28 ENCOUNTER — Other Ambulatory Visit: Payer: Self-pay

## 2019-04-28 ENCOUNTER — Other Ambulatory Visit: Payer: Self-pay | Admitting: Licensed Clinical Social Worker

## 2019-04-28 ENCOUNTER — Encounter (HOSPITAL_COMMUNITY): Payer: Self-pay | Admitting: Internal Medicine

## 2019-04-28 DIAGNOSIS — C9200 Acute myeloblastic leukemia, not having achieved remission: Secondary | ICD-10-CM | POA: Diagnosis not present

## 2019-04-28 DIAGNOSIS — Z79899 Other long term (current) drug therapy: Secondary | ICD-10-CM | POA: Diagnosis not present

## 2019-04-28 DIAGNOSIS — D649 Anemia, unspecified: Secondary | ICD-10-CM

## 2019-04-28 DIAGNOSIS — Z9181 History of falling: Secondary | ICD-10-CM | POA: Insufficient documentation

## 2019-04-28 DIAGNOSIS — D61818 Other pancytopenia: Secondary | ICD-10-CM | POA: Insufficient documentation

## 2019-04-28 DIAGNOSIS — Z87820 Personal history of traumatic brain injury: Secondary | ICD-10-CM | POA: Insufficient documentation

## 2019-04-28 LAB — CBC WITH DIFFERENTIAL/PLATELET
Abs Immature Granulocytes: 0 10*3/uL (ref 0.00–0.07)
Band Neutrophils: 1 %
Basophils Absolute: 0 10*3/uL (ref 0.0–0.1)
Basophils Relative: 0 %
Blasts: 65 %
Eosinophils Absolute: 0 10*3/uL (ref 0.0–0.5)
Eosinophils Relative: 0 %
HCT: 22 % — ABNORMAL LOW (ref 36.0–46.0)
Hemoglobin: 7 g/dL — ABNORMAL LOW (ref 12.0–15.0)
Lymphocytes Relative: 19 %
Lymphs Abs: 2 10*3/uL (ref 0.7–4.0)
MCH: 27.3 pg (ref 26.0–34.0)
MCHC: 31.8 g/dL (ref 30.0–36.0)
MCV: 85.9 fL (ref 80.0–100.0)
Monocytes Absolute: 0.9 10*3/uL (ref 0.1–1.0)
Monocytes Relative: 9 %
Neutro Abs: 0.7 10*3/uL — ABNORMAL LOW (ref 1.7–7.7)
Neutrophils Relative %: 6 %
Platelets: 7 10*3/uL — CL (ref 150–400)
RBC: 2.56 MIL/uL — ABNORMAL LOW (ref 3.87–5.11)
RDW: 18.8 % — ABNORMAL HIGH (ref 11.5–15.5)
Smear Review: DECREASED
WBC Morphology: ABNORMAL
WBC: 10.5 10*3/uL (ref 4.0–10.5)
nRBC: 0.5 % — ABNORMAL HIGH (ref 0.0–0.2)

## 2019-04-28 LAB — COMPREHENSIVE METABOLIC PANEL
ALT: 14 U/L (ref 0–44)
AST: 35 U/L (ref 15–41)
Albumin: 3.6 g/dL (ref 3.5–5.0)
Alkaline Phosphatase: 60 U/L (ref 38–126)
Anion gap: 8 (ref 5–15)
BUN: 36 mg/dL — ABNORMAL HIGH (ref 8–23)
CO2: 25 mmol/L (ref 22–32)
Calcium: 9 mg/dL (ref 8.9–10.3)
Chloride: 99 mmol/L (ref 98–111)
Creatinine, Ser: 0.65 mg/dL (ref 0.44–1.00)
GFR calc Af Amer: >60 mL/min (ref >60)
GFR calc non Af Amer: >60 mL/min (ref >60)
Glucose, Bld: 131 mg/dL — ABNORMAL HIGH (ref 70–99)
Potassium: 3.5 mmol/L (ref 3.5–5.1)
Sodium: 132 mmol/L — ABNORMAL LOW (ref 135–145)
Total Bilirubin: 0.4 mg/dL (ref 0.3–1.2)
Total Protein: 6 g/dL — ABNORMAL LOW (ref 6.5–8.1)

## 2019-04-28 LAB — SAMPLE TO BLOOD BANK

## 2019-04-28 LAB — PREPARE RBC (CROSSMATCH)

## 2019-04-28 LAB — LACTATE DEHYDROGENASE: LDH: 784 U/L — ABNORMAL HIGH (ref 98–192)

## 2019-04-28 MED ORDER — DIPHENHYDRAMINE HCL 25 MG PO CAPS
25.0000 mg | ORAL_CAPSULE | Freq: Once | ORAL | Status: AC
  Administered 2019-04-28: 25 mg via ORAL
  Filled 2019-04-28: qty 1

## 2019-04-28 MED ORDER — SODIUM CHLORIDE 0.9% IV SOLUTION
250.0000 mL | Freq: Once | INTRAVENOUS | Status: AC
  Filled 2019-04-28: qty 250

## 2019-04-28 MED ORDER — ACETAMINOPHEN 325 MG PO TABS
650.0000 mg | ORAL_TABLET | Freq: Once | ORAL | Status: AC
  Administered 2019-04-28: 09:00:00 650 mg via ORAL
  Filled 2019-04-28: qty 2

## 2019-04-28 MED ORDER — SODIUM CHLORIDE 0.9% FLUSH
10.0000 mL | Freq: Once | INTRAVENOUS | Status: AC
  Administered 2019-04-28: 09:00:00 10 mL via INTRAVENOUS
  Filled 2019-04-28: qty 10

## 2019-04-28 MED ORDER — SODIUM CHLORIDE 0.9% IV SOLUTION
250.0000 mL | Freq: Once | INTRAVENOUS | Status: AC
  Administered 2019-04-28: 09:00:00 250 mL via INTRAVENOUS
  Filled 2019-04-28: qty 250

## 2019-04-28 MED ORDER — HEPARIN SOD (PORK) LOCK FLUSH 100 UNIT/ML IV SOLN
250.0000 [IU] | INTRAVENOUS | Status: AC | PRN
  Administered 2019-04-28: 250 [IU]
  Filled 2019-04-28 (×2): qty 5

## 2019-04-28 NOTE — Telephone Encounter (Signed)
Oral Oncology Patient Advocate Encounter  Prior Authorization for Antony Blackbird has been approved.    PA# [id number] Effective dates: 04/26/2019 through 10/24/2019  Patients co-pay is $517.95.  Oral Oncology Clinic will continue to follow.   [place] Patient [place] Phone [phone] Fax [phone] 04/28/2019 8:33 AM

## 2019-04-29 ENCOUNTER — Telehealth: Payer: Self-pay | Admitting: Pharmacist

## 2019-04-29 ENCOUNTER — Ambulatory Visit: Payer: Medicare Other

## 2019-04-29 DIAGNOSIS — C9200 Acute myeloblastic leukemia, not having achieved remission: Secondary | ICD-10-CM

## 2019-04-29 LAB — BPAM PLATELET PHERESIS
Blood Product Expiration Date: 202011182359
ISSUE DATE / TIME: 202011160931
Unit Type and Rh: 5100

## 2019-04-29 LAB — BPAM RBC
Blood Product Expiration Date: 202011252359
ISSUE DATE / TIME: 202011161059
Unit Type and Rh: 9500

## 2019-04-29 LAB — TYPE AND SCREEN
ABO/RH(D): O POS
Antibody Screen: NEGATIVE
Unit division: 0

## 2019-04-29 LAB — PREPARE PLATELET PHERESIS: Unit division: 0

## 2019-04-29 MED FILL — XOSPATA 40 MG TABS: 40 | 30 days supply | Qty: 60 | Fill #1

## 2019-04-29 NOTE — Telephone Encounter (Signed)
Oral Chemotherapy Pharmacist Encounter  Dispensed samples to patient:  Medication: Promacta 50mg  tablets Instructions: Take 1 tablet (50mg ) by mouth daily. Take on an empty stomach, at least 1 hour before a meal or 2 hours after. Quantity dispensed: 14 tablets Days supply: 14 days Manufacturer: [employer] Lot: [id number] Exp: 06/11/2020  Darl Pikes, PharmD, BCPS, [facility] Hematology/Oncology Clinical Pharmacist [facility] [place] [facility] [phone]  04/29/2019 11:12 AM

## 2019-04-30 NOTE — Telephone Encounter (Signed)
Oral Chemotherapy Pharmacist Encounter  Patient Education I spoke with patient and her husband in the lobby for overview of new oral chemotherapy medication: Promacta (eltrombopag) for the treatment of thrombocytopenia, planned duration until she stops responding or unacceptable drug toxicity.   Counseled patient on administration, dosing, side effects, monitoring, drug-food interactions, safe handling, storage, and disposal. Patient will take 1 tablet (50 mg total) by mouth daily. Take on an empty stomach 1 hour before a meal or 2 hours after.  Patient provided with samples to get started while her assistance application is pending.  Side effects include but not limited to: N/V, diarrhea.    Reviewed with patient importance of keeping a medication schedule and plan for any missed doses.  Ms. Westerhold voiced understanding and appreciation. All questions answered.  Provided patient with [facility] [place] [facility] phone number. Patient knows to call the office with questions or concerns. Oral Chemotherapy Navigation Clinic will continue to follow.  Darl Pikes, PharmD, BCPS, [facility] Hematology/Oncology Clinical Pharmacist [facility] [place] [facility] [phone]  04/30/2019 4:28 PM

## 2019-05-02 ENCOUNTER — Inpatient Hospital Stay (HOSPITAL_BASED_OUTPATIENT_CLINIC_OR_DEPARTMENT_OTHER): Payer: Medicare Other | Admitting: Internal Medicine

## 2019-05-02 ENCOUNTER — Inpatient Hospital Stay: Payer: Medicare Other

## 2019-05-02 ENCOUNTER — Other Ambulatory Visit: Payer: Self-pay | Admitting: *Deleted

## 2019-05-02 ENCOUNTER — Telehealth: Payer: Self-pay | Admitting: Pharmacy Technician

## 2019-05-02 ENCOUNTER — Other Ambulatory Visit: Payer: Self-pay

## 2019-05-02 ENCOUNTER — Encounter: Payer: Self-pay | Admitting: Internal Medicine

## 2019-05-02 VITALS — BP 127/70 | HR 64 | Temp 95.2°F | Resp 18

## 2019-05-02 DIAGNOSIS — D500 Iron deficiency anemia secondary to blood loss (chronic): Secondary | ICD-10-CM

## 2019-05-02 DIAGNOSIS — C9200 Acute myeloblastic leukemia, not having achieved remission: Secondary | ICD-10-CM

## 2019-05-02 DIAGNOSIS — D649 Anemia, unspecified: Secondary | ICD-10-CM

## 2019-05-02 LAB — CBC WITH DIFFERENTIAL/PLATELET
Abs Immature Granulocytes: 0 10*3/uL (ref 0.00–0.07)
Band Neutrophils: 1 %
Basophils Absolute: 0 10*3/uL (ref 0.0–0.1)
Basophils Relative: 0 %
Blasts: 47 %
Eosinophils Absolute: 0 10*3/uL (ref 0.0–0.5)
Eosinophils Relative: 0 %
HCT: 22.1 % — ABNORMAL LOW (ref 36.0–46.0)
Hemoglobin: 7.1 g/dL — ABNORMAL LOW (ref 12.0–15.0)
Lymphocytes Relative: 34 %
Lymphs Abs: 0.9 10*3/uL (ref 0.7–4.0)
MCH: 27.8 pg (ref 26.0–34.0)
MCHC: 32.1 g/dL (ref 30.0–36.0)
MCV: 86.7 fL (ref 80.0–100.0)
Monocytes Absolute: 0.4 10*3/uL (ref 0.1–1.0)
Monocytes Relative: 14 %
Myelocytes: 1 %
Neutro Abs: 0.1 10*3/uL — ABNORMAL LOW (ref 1.7–7.7)
Neutrophils Relative %: 3 %
Platelets: 6 10*3/uL — CL (ref 150–400)
RBC: 2.55 MIL/uL — ABNORMAL LOW (ref 3.87–5.11)
RDW: 18.2 % — ABNORMAL HIGH (ref 11.5–15.5)
Smear Review: DECREASED
WBC: 2.5 10*3/uL — ABNORMAL LOW (ref 4.0–10.5)
nRBC: 2.8 % — ABNORMAL HIGH (ref 0.0–0.2)

## 2019-05-02 LAB — SAMPLE TO BLOOD BANK

## 2019-05-02 LAB — PREPARE RBC (CROSSMATCH)

## 2019-05-02 MED ORDER — SODIUM CHLORIDE 0.9% IV SOLUTION
250.0000 mL | Freq: Once | INTRAVENOUS | Status: AC
  Administered 2019-05-02: 250 mL via INTRAVENOUS
  Filled 2019-05-02: qty 250

## 2019-05-02 MED ORDER — DIPHENHYDRAMINE HCL 25 MG PO CAPS
25.0000 mg | ORAL_CAPSULE | Freq: Once | ORAL | Status: AC
  Administered 2019-05-02: 25 mg via ORAL
  Filled 2019-05-02: qty 1

## 2019-05-02 MED ORDER — SODIUM CHLORIDE 0.9% FLUSH
10.0000 mL | Freq: Once | INTRAVENOUS | Status: AC
  Administered 2019-05-02: 10 mL via INTRAVENOUS
  Filled 2019-05-02: qty 10

## 2019-05-02 MED ORDER — ACETAMINOPHEN 325 MG PO TABS
650.0000 mg | ORAL_TABLET | Freq: Once | ORAL | Status: AC
  Administered 2019-05-02: 650 mg via ORAL
  Filled 2019-05-02: qty 2

## 2019-05-02 MED ORDER — HEPARIN SOD (PORK) LOCK FLUSH 100 UNIT/ML IV SOLN
500.0000 [IU] | Freq: Every day | INTRAVENOUS | Status: AC | PRN
  Administered 2019-05-02: 500 [IU]
  Filled 2019-05-02: qty 5

## 2019-05-02 NOTE — Telephone Encounter (Signed)
Oral Oncology Patient Advocate Encounter  Received notification from [place] that patient has been successfully enrolled into their program to receive Promacta from the manufacturer at $0 out of pocket until 06/12/2019.    I spoke to the patient this morning before her appointment.  I will call and let her husband know as well.  They know we will have to re-apply.   Patient knows to call the office with questions or concerns.   Oral Oncology Clinic will continue to follow.  [place] Patient [place] Phone [phone] Fax [phone] 05/02/2019 9:42 AM

## 2019-05-02 NOTE — Progress Notes (Signed)
[facility] OFFICE PROGRESS NOTE  Patient Care Team: Rusty Aus, MD as PCP - General (Internal Medicine)  CHIEF COMPLAINTS/PURPOSE OF CONSULTATION: Acute myeloid leukemia   Oncology History Overview Note  # SEP 2017- MYELOPROLIFERATIVE NEOPLASM- [WBC- 38; normal Hb/platelets] hypercellular bone marrow 90-95% proliferation of myeloid cells in various stages of maturation; relative erythroid hypoplasia and proliferation of atypical megakaryocytes; no increase in blasts; peripheral blood Bcr-Abl-NEG; Cytogenetics- WNL. NEG- Jak-2/MPL/CALR [place] limited- mild splenomegaly [~10cm; 463cm3]; OCT 2017- second opinion at [facility]. Surveillance.   # With progressive leukocytosis we evaluated her a month ago and sent BM for exam. She has a progressive leukocytosis so hydrea 561m daily was started on 10/20/2016 and increased to 2gm daily then back down to one daily, then 5 days per week over time due to counts drop. Then we held her hydrea since 04/09/2018 due to continued declining Hb and Plt.s  BMB 06/24/18 showed markedly hypercellular BM 95% with myeloid hyperplasia and atypical megakaryocytic hyperplasia. No significant increase in blasts. 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 BCR / ABL negative, FISH normal. Flow Showed 2%CD34-positive myeloid blasts. Myeloid precursors with low side scatter. Pathogenic variants were detected in the ASXL1, CCND2, CUX1, and U2AF1 genes.  # March 2020- wbc- 14/ Hb 9.5/ platelets- 54.   ---------------------------------------------------   # June 8th 2020- Acute myeloid leukemia [peripheral blood flow cytometry;NGS- pending]- June 15th Vidaza [SQ 1-5 + Venatoclax- #1in pt]; tumor lysis prophylaxis  # Day #28 bone marrow biopsy-  7/14- BMx- NO BLASTS; positive for dysplastic changes. OFF Venatoclax [since 7/17] sec to cytopenias.  #817 cycle #2 Vidaza+ venetoclax 100 mg; stop venetoclax on  8/24 [severe pancytopenia]  # 02/18/2019-start gilteritinib 80 mg a day; HELD on 9/14 [sec to severe pan]  # NOV 9th/2020-BMBx- RELAPSED AML; NOV 11th- STARTED GILTERITINIB 416mday  # AUG 2020- AST elevation- 248-?sec to Posonoazole; RESOLVED; continue at lower dose 100 mg/day.   # 2002 [[place]] BREAST CA s/p Lumpect RT; [? Stage I] no chemo s/p AI  # F-ONE HEM: FLT3 ALTERATION NOTED  DIAGNOSIS: ACUTE MYELOID LEUKEMIA GOALS: pallaitive  CURRENT/MOST RECENT THERAPY : Gilteritinib     MDS ([place]plastic syndrome) ([facility]  08/28/2018 Initial Diagnosis   MDS (myelodysplastic syndrome) ([facility]  Acute myeloid leukemia not having achieved remission ([facility] 11/25/2018 Initial Diagnosis   AML (acute myeloid leukemia) with failed remission ([facility]  11/25/2018 - 12/22/2018 Chemotherapy   The patient had azaCITIDine (VIDAZA) chemo injection 105 mg, 75 mg/m2 = 105 mg, Subcutaneous,  Once, 1 of 4 cycles Administration: 105 mg (11/25/2018), 105 mg (11/26/2018), 105 mg (11/27/2018), 105 mg (11/28/2018), 105 mg (11/29/2018)  for chemotherapy treatment.    01/27/2019 -  Chemotherapy   The patient had palonosetron (ALOXI) injection 0.25 mg, 0.25 mg, Intravenous,  Once, 1 of 4 cycles Administration: 0.25 mg (01/27/2019), 0.25 mg (01/29/2019), 0.25 mg (01/31/2019) azaCITIDine (VIDAZA) 100 mg in sodium chloride 0.9 % 50 mL chemo infusion, 75 mg/m2 = 100 mg, Intravenous, Once, 1 of 4 cycles Administration: 100 mg (01/27/2019), 100 mg (01/28/2019), 100 mg (01/29/2019), 100 mg (01/30/2019), 100 mg (01/31/2019)  for chemotherapy treatment.      HISTORY OF PRESENTING ILLNESS:  Kathryn Hahn [age].o.  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  Patient admits to fatigue.  Denies any nausea vomiting or diarrhea.  Denies any significant swelling in the legs or weight gain.  Appetite is fair.  No falls.  No  headaches.    Review of Systems  Constitutional: Positive for malaise/fatigue. Negative for chills, diaphoresis, fever and weight loss.  HENT: Negative for nosebleeds and sore throat.   Eyes: Negative for double vision.  Respiratory: Positive for shortness of breath. Negative for cough, hemoptysis, sputum production and wheezing.   Cardiovascular: Positive for leg swelling. Negative for chest pain, palpitations and orthopnea.  Gastrointestinal: Negative for abdominal pain, blood in stool, constipation, diarrhea, heartburn, melena, nausea and vomiting.  Genitourinary: Negative for dysuria, frequency and urgency.  Musculoskeletal: Negative for back pain and joint pain.  Skin: Negative.  Negative for itching and rash.  Neurological: Negative for dizziness, tingling, focal weakness, weakness and headaches.  Endo/Heme/Allergies: Bruises/bleeds easily.  Psychiatric/Behavioral: Negative for depression. The patient is not nervous/anxious and does not have insomnia.     MEDICAL HISTORY:  Past Medical History:  Diagnosis Date  . Anemia   . Arthritis   . Breast cancer ([place]) 2003   RT LUMPECTOMY  . Edema leg   . History of breast cancer 2003   post lumpectomy  . History of kidney stones   . Hyperlipemia, mixed   . Hypertension, essential   . Leukemia ([place])   . Leukocytosis   . MDS (myelodysplastic syndrome) ([place])   . Personal history of radiation therapy 2003   BREAST CA  . Renal stones   . Vitamin D deficiency     SURGICAL HISTORY: Past Surgical History:  Procedure Laterality Date  . BREAST BIOPSY Right 2003   Postive for cancer  . BREAST LUMPECTOMY Right 2003   BREAST CA  . KIDNEY STONE SURGERY Right     SOCIAL HISTORY: Social History   Socioeconomic History  . Marital status: Married    Spouse name: Not on file  . Number of children: Not on file  . Years of education: Not on file  . Highest education level: Not on file  Occupational History  . Not on file  Social  Needs  . Financial resource strain: Not on file  . Food insecurity    Worry: Not on file    Inability: Not on file  . Transportation needs    Medical: Not on file    Non-medical: Not on file  Tobacco Use  . Smoking status: Never Smoker  . Smokeless tobacco: Never Used  Substance and Sexual Activity  . Alcohol use: Yes    Alcohol/week: 3.0 standard drinks    Types: 3 Glasses of wine per week  . Drug use: No  . Sexual activity: Not on file  Lifestyle  . Physical activity    Days per week: Not on file    Minutes per session: Not on file  . Stress: Not on file  Relationships  . Social Herbalist on phone: Not on file    Gets together: Not on file    Attends religious service: Not on file    Active member of club or organization: Not on file    Attends meetings of clubs or organizations: Not on file    Relationship status: Not on file  . Intimate partner violence    Fear of current or ex partner: Not on file    Emotionally abused: Not on file    Physically abused: Not on file    Forced sexual activity: Not on file  Other Topics Concern  . Not on file  Social History Narrative    No smoking/alcohol;  lives in [place] with family.  She lives in assisted living with her husband.    FAMILY HISTORY: Family History  Problem Relation Age of Onset  . Stroke Mother   . Hypertension Father   . Stroke Father   . Breast cancer Neg Hx     ALLERGIES:  is allergic to terbinafine.  MEDICATIONS:  Current Outpatient Medications  Medication Sig Dispense Refill  . acyclovir (ZOVIRAX) 400 MG tablet ONE PILL A DAY [TO PREVENT SHINGLES] 90 tablet 1  . atenolol (TENORMIN) 50 MG tablet Take 1 tablet by mouth 2 (two) times daily.    . calcium carbonate (OSCAL) 1500 (600 Ca) MG TABS tablet Take 600 mg of elemental calcium by mouth daily with breakfast.    . Cholecalciferol (VITAMIN D) 2000 units tablet Take 1 tablet by mouth daily.    Marland Kitchen eltrombopag (PROMACTA) 50 MG tablet Take  1 tablet (50 mg total) by mouth daily. Take on an empty stomach 1 hour before a meal or 2 hours after 30 tablet 3  . Gilteritinib Fumarate (XOSPATA) 40 MG TABS Take 80 mg by mouth daily. 60 tablet 2  . heparin lock flush 100 UNIT/ML SOLN injection Inject into the vein.    . Infant Care Products (DERMACLOUD) CREA Apply topically.    Marland Kitchen levofloxacin (LEVAQUIN) 250 MG tablet Take 1 tablet (250 mg total) by mouth daily. 30 tablet 3  . losartan-hydrochlorothiazide (HYZAAR) 50-12.5 MG tablet Take 1 tablet by mouth daily.    . magic mouthwash w/lidocaine SOLN Take 5 mLs by mouth 4 (four) times daily as needed for mouth pain. 480 mL 3  . potassium chloride SA (K-DUR) 20 MEQ tablet Take 1 tablet (20 mEq total) by mouth 2 (two) times daily. 10 tablet 0  . senna (SENOKOT) 8.6 MG tablet Take 2 tablets by mouth 2 (two) times daily.    Marland Kitchen acetaminophen (TYLENOL) 325 MG tablet Take 325 mg by mouth every 6 (six) hours as needed for mild pain or moderate pain.     . magnesium hydroxide (MILK OF MAGNESIA) 400 MG/5ML suspension Take 5 mLs by mouth every 4 (four) hours as needed.    . ondansetron (ZOFRAN) 8 MG tablet Take by mouth every 8 (eight) hours as needed for nausea or vomiting.    . polyethylene glycol (MIRALAX / GLYCOLAX) 17 g packet Take 17 g by mouth daily.     No current facility-administered medications for this visit.    Facility-Administered Medications Ordered in Other Visits  Medication Dose Route Frequency Provider Last Rate Last Dose  . 0.9 %  sodium chloride infusion   Intravenous Continuous Jacquelin Hawking, NP   Stopped at 02/05/19 1630  . heparin lock flush 100 unit/mL  500 Units Intracatheter Daily PRN Verlon Au, NP        PHYSICAL EXAMINATION: ECOG PERFORMANCE STATUS: 1 - Symptomatic but completely ambulatory  There were no vitals filed for this visit. There were no vitals filed for this visit.  Physical Exam  Constitutional: She is oriented to person, place, and time and  well-developed, well-nourished, and in no distress.  PICC line in place.  No active bleeding.  Masked.  In a wheelchair.  HENT:  Head: Normocephalic and atraumatic.  Mouth/Throat: Oropharynx is clear and moist. No oropharyngeal exudate.  Eyes: Conjunctivae are normal. No scleral icterus.  Neck: Normal range of motion. Neck supple.  Cardiovascular: Normal rate and regular rhythm.  Pulmonary/Chest: Effort normal. No respiratory distress.  Abdominal: Soft. Bowel sounds  are normal. She exhibits no distension and no mass. There is no abdominal tenderness. There is no rebound and no guarding.  Musculoskeletal: Normal range of motion.        General: No tenderness or edema.     Comments: Sitting in wheelchair  Neurological: She is alert and oriented to person, place, and time.  Skin: Skin is warm.  Multiple bruises on lower legs, few on hands and arms. Torso bruising improved/mostly resolved  Psychiatric: Affect normal.    LABORATORY DATA:  I have reviewed the data as listed Lab Results  Component Value Date   WBC 2.5 (L) 05/02/2019   HGB 7.1 (L) 05/02/2019   HCT 22.1 (L) 05/02/2019   MCV 86.7 05/02/2019   PLT 6 (LL) 05/02/2019   Recent Labs    04/07/19 0827 04/14/19 0905 04/24/19 0829 04/28/19 0837  NA 134* 137 135 132*  K 3.8 3.5 3.3* 3.5  CL 100 100 99 99  CO2 _0 GLUCOSE 167* 191* 138* 131*  BUN 22 23 30* 36*  CREATININE 0.50 0.77 0.77 0.65  CALCIUM 9.1 9.2 9.4 9.0  GFRNONAA >60 >60 >60 >60  GFRAA >60 >60 >60 >60  PROT 6.1* 6.5  --  6.0*  ALBUMIN 3.6 3.8  --  3.6  AST 21 25  --  35  ALT 13 12  --  14  ALKPHOS 75 74  --  60  BILITOT 0.7 0.5  --  0.4    RADIOGRAPHIC STUDIES: I have personally reviewed the radiological images as listed and agreed with the findings in the report. Ct Bone Marrow Biopsy & Aspiration  Result Date: 04/21/2019 CLINICAL DATA:  Anemia in the setting of AML EXAM: CT GUIDED DEEP ILIAC BONE ASPIRATION AND CORE BIOPSY TECHNIQUE:  Patient was placed prone on the CT gantry and limited axial scans through the pelvis were obtained. Appropriate skin entry site was identified. Skin site was marked, prepped with chlorhexidine, draped in usual sterile fashion, and infiltrated locally with 1% lidocaine. Intravenous Fentanyl 32mg and Versed 114mwere administered as conscious sedation during continuous monitoring of the patient's level of consciousness and physiological / cardiorespiratory status by the radiology RN, with a total moderate sedation time of 11 minutes. Under CT fluoroscopic guidance an 11-gauge Cook trocar bone needle was advanced into the right iliac bone just lateral to the sacroiliac joint. Once needle tip position was confirmed, core and aspiration samples were obtained, submitted to pathology for approval. Patient tolerated procedure well. COMPLICATIONS: COMPLICATIONS none IMPRESSION: 1. Technically successful CT guided right iliac bone core and aspiration biopsy. Electronically Signed   By: D Lucrezia Europe.D.   On: 04/21/2019 09:36    ASSESSMENT & PLAN:   Acute myeloid leukemia not having achieved remission ([facility]#Relapsed acute myeloid leukemia with monocytic differentiation-bone marrow biopsy, April 22, 2019-relapse 50 to 60% involvement.  NGS-pending.   # currently on gilteritinib-40 mg a day [given the previous significant bone marrow suppression]; escalate as tolerated.   #Leucopenia/ anemia/thrombocytopenia-White count 2.5 /hemoglobin 7.1 platelets 7:  secondary to underlying acute myeloid leukemia/Gilteritinib.   Proceed with platelet transfusion/PRBC transfusion.    #Supportive care for AML-  -Continue Noxafil/posaconazole 10056mnd acyclovir 400 mg, and levaquin 250 mg for prophylaxis. Continue PICC line dressing changes weekly  #Discussed above plan of care with patient.;  Given the poor connection will reach out to the husband later in the day.  Disposition: # Transfuse 1 unit of platelets today; 1  PRBC  #  appt on 11/23 as planned # 11/25- cbc/bmp/ldh/hold tube- 1 unit platelets/1 unit blood # Follow up- 11/30 or dec 1st /labs-cbc/bmp;LDH; possible 1 unit platelets/1 unit PRBC transfusion- Dr.B

## 2019-05-02 NOTE — Assessment & Plan Note (Addendum)
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  NGS-pending.   # currently on gilteritinib-40 mg a day [given the previous significant bone marrow suppression]; escalate as tolerated.  Patient also started on Promacta after discussion with Dr.Rizzerri.   #Leucopenia/ anemia/thrombocytopenia-White count 2.5 /hemoglobin 7.1 platelets 7:  secondary to underlying acute myeloid leukemia/Gilteritinib.   Proceed with platelet transfusion/PRBC transfusion.    #Supportive care for AML-  -Continue Noxafil/posaconazole '100mg'$  and acyclovir 400 mg, and levaquin 250 mg for prophylaxis. Continue PICC line dressing changes weekly  #Discussed above plan of care with patient.;  Given the poor connection will reach out to the husband later in the day.  Disposition: # Transfuse 1 unit of platelets today; 1 PRBC  # appt on 11/23 as planned # 11/25- cbc/bmp/ldh/hold tube- 1 unit platelets/1 unit blood # Follow up- 11/30 or dec 1st /labs-cbc/bmp;LDH; possible 1 unit platelets/1 unit PRBC transfusion- Dr.B

## 2019-05-02 NOTE — Telephone Encounter (Signed)
Oral Oncology Patient Advocate Encounter  Spoke to patient and completed patient portion of Patient Assistance Now Oncology application online with patients consent.  [employer] determines if the patient is eligible for the [employer] Patient [place].  If approved, [employer] will reduce patient's out of pocket expense for Promacta to $0.    Patient application completed online on 04/29/2019.  HCP form was completed and faxed to [facility] on 04/29/2019.  [employer] will fax results of their benefits investigation and notice if patient is eligible for assistance.  This encounter will be updated until final determination.   [place] Patient [place] Phone [phone] Fax [phone] 05/02/2019 9:42 AM

## 2019-05-03 LAB — TYPE AND SCREEN
ABO/RH(D): O POS
Antibody Screen: NEGATIVE
Unit division: 0

## 2019-05-03 LAB — BPAM PLATELET PHERESIS
Blood Product Expiration Date: 202011202359
ISSUE DATE / TIME: 202011201216
Unit Type and Rh: 5100

## 2019-05-03 LAB — PREPARE PLATELET PHERESIS: Unit division: 0

## 2019-05-03 LAB — BPAM RBC
Blood Product Expiration Date: 202012162359
ISSUE DATE / TIME: 202011201030
Unit Type and Rh: 5100

## 2019-05-04 ENCOUNTER — Telehealth: Payer: Self-pay | Admitting: Internal Medicine

## 2019-05-04 NOTE — Telephone Encounter (Signed)
Late entry- on 11/20 I spoke to patient's husband regarding patient's clinical status-regarding recurrent/relapsed acute leukemia-which is incurable.  Unfortunately the prognosis continues to be poor.  Continue palliative treatment with gilteritinib at this time.

## 2019-05-05 ENCOUNTER — Other Ambulatory Visit: Payer: Self-pay | Admitting: *Deleted

## 2019-05-05 ENCOUNTER — Other Ambulatory Visit: Payer: Self-pay

## 2019-05-05 ENCOUNTER — Inpatient Hospital Stay: Payer: Medicare Other | Admitting: *Deleted

## 2019-05-05 ENCOUNTER — Inpatient Hospital Stay: Payer: Medicare Other

## 2019-05-05 ENCOUNTER — Encounter (HOSPITAL_COMMUNITY): Payer: Self-pay | Admitting: Internal Medicine

## 2019-05-05 DIAGNOSIS — C9200 Acute myeloblastic leukemia, not having achieved remission: Secondary | ICD-10-CM

## 2019-05-05 DIAGNOSIS — Z452 Encounter for adjustment and management of vascular access device: Secondary | ICD-10-CM

## 2019-05-05 LAB — CBC WITH DIFFERENTIAL/PLATELET
Abs Immature Granulocytes: 0 10*3/uL (ref 0.00–0.07)
Basophils Absolute: 0 10*3/uL (ref 0.0–0.1)
Basophils Relative: 0 %
Blasts: 38 %
Eosinophils Absolute: 0 10*3/uL (ref 0.0–0.5)
Eosinophils Relative: 0 %
HCT: 24.3 % — ABNORMAL LOW (ref 36.0–46.0)
Hemoglobin: 7.7 g/dL — ABNORMAL LOW (ref 12.0–15.0)
Lymphocytes Relative: 41 %
Lymphs Abs: 0.7 10*3/uL (ref 0.7–4.0)
MCH: 27.6 pg (ref 26.0–34.0)
MCHC: 31.7 g/dL (ref 30.0–36.0)
MCV: 87.1 fL (ref 80.0–100.0)
Metamyelocytes Relative: 1 %
Monocytes Absolute: 0.3 10*3/uL (ref 0.1–1.0)
Monocytes Relative: 15 %
Myelocytes: 1 %
Neutro Abs: 0.1 10*3/uL — ABNORMAL LOW (ref 1.7–7.7)
Neutrophils Relative %: 4 %
Platelets: 4 10*3/uL — CL (ref 150–400)
RBC: 2.79 MIL/uL — ABNORMAL LOW (ref 3.87–5.11)
RDW: 17.6 % — ABNORMAL HIGH (ref 11.5–15.5)
WBC: 1.8 10*3/uL — ABNORMAL LOW (ref 4.0–10.5)
nRBC: 4.4 % — ABNORMAL HIGH (ref 0.0–0.2)

## 2019-05-05 LAB — BASIC METABOLIC PANEL
Anion gap: 7 (ref 5–15)
BUN: 30 mg/dL — ABNORMAL HIGH (ref 8–23)
CO2: 26 mmol/L (ref 22–32)
Calcium: 9 mg/dL (ref 8.9–10.3)
Chloride: 101 mmol/L (ref 98–111)
Creatinine, Ser: 0.73 mg/dL (ref 0.44–1.00)
GFR calc Af Amer: >60 mL/min (ref >60)
GFR calc non Af Amer: >60 mL/min (ref >60)
Glucose, Bld: 147 mg/dL — ABNORMAL HIGH (ref 70–99)
Potassium: 3.4 mmol/L — ABNORMAL LOW (ref 3.5–5.1)
Sodium: 134 mmol/L — ABNORMAL LOW (ref 135–145)

## 2019-05-05 LAB — PREPARE RBC (CROSSMATCH)

## 2019-05-05 LAB — SAMPLE TO BLOOD BANK

## 2019-05-05 LAB — LACTATE DEHYDROGENASE: LDH: 743 U/L — ABNORMAL HIGH (ref 98–192)

## 2019-05-05 MED ORDER — SODIUM CHLORIDE 0.9% IV SOLUTION
250.0000 mL | Freq: Once | INTRAVENOUS | Status: AC
  Administered 2019-05-05: 250 mL via INTRAVENOUS
  Filled 2019-05-05: qty 250

## 2019-05-05 MED ORDER — SODIUM CHLORIDE 0.9% FLUSH
10.0000 mL | Freq: Once | INTRAVENOUS | Status: AC
  Administered 2019-05-05: 10 mL via INTRAVENOUS
  Filled 2019-05-05: qty 10

## 2019-05-05 MED ORDER — ACETAMINOPHEN 325 MG PO TABS
650.0000 mg | ORAL_TABLET | Freq: Once | ORAL | Status: AC
  Administered 2019-05-05: 650 mg via ORAL
  Filled 2019-05-05: qty 2

## 2019-05-05 MED ORDER — POTASSIUM CHLORIDE CRYS ER 20 MEQ PO TBCR: 20.0000 meq | EXTENDED_RELEASE_TABLET | Freq: Two times a day (BID) | ORAL | 0 refills | Status: DC

## 2019-05-05 MED ORDER — DIPHENHYDRAMINE HCL 25 MG PO CAPS
25.0000 mg | ORAL_CAPSULE | Freq: Once | ORAL | Status: AC
  Administered 2019-05-05: 25 mg via ORAL
  Filled 2019-05-05: qty 1

## 2019-05-05 MED ORDER — LEVOFLOXACIN 250 MG PO TABS: 250.0000 mg | ORAL_TABLET | Freq: Every day | ORAL | 3 refills | Status: DC

## 2019-05-05 MED ORDER — HEPARIN SOD (PORK) LOCK FLUSH 100 UNIT/ML IV SOLN
250.0000 [IU] | Freq: Once | INTRAVENOUS | Status: AC
  Administered 2019-05-05: 250 [IU] via INTRAVENOUS
  Filled 2019-05-05: qty 5

## 2019-05-06 LAB — TYPE AND SCREEN
ABO/RH(D): O POS
Antibody Screen: NEGATIVE
Unit division: 0

## 2019-05-06 LAB — PREPARE PLATELET PHERESIS: Unit division: 0

## 2019-05-06 LAB — BPAM PLATELET PHERESIS
Blood Product Expiration Date: 202011232359
ISSUE DATE / TIME: 202011231230
Unit Type and Rh: 6200

## 2019-05-06 LAB — BPAM RBC
Blood Product Expiration Date: 202011292359
ISSUE DATE / TIME: 202011231026
Unit Type and Rh: 9500

## 2019-05-06 NOTE — Telephone Encounter (Signed)
Called [employer] to check the status of medication shipment.  Eddie Dibbles, rep, stated that medication was being processed for shipping and should go out tomorrow.  No tracking number yet.

## 2019-05-06 NOTE — Progress Notes (Signed)
Patient is coming in for follow up she is doing well no complaints  

## 2019-05-07 ENCOUNTER — Other Ambulatory Visit: Payer: Self-pay

## 2019-05-07 ENCOUNTER — Other Ambulatory Visit: Payer: Self-pay | Admitting: Licensed Clinical Social Worker

## 2019-05-07 ENCOUNTER — Other Ambulatory Visit: Payer: Self-pay | Admitting: Internal Medicine

## 2019-05-07 ENCOUNTER — Inpatient Hospital Stay: Payer: Medicare Other

## 2019-05-07 ENCOUNTER — Inpatient Hospital Stay (HOSPITAL_BASED_OUTPATIENT_CLINIC_OR_DEPARTMENT_OTHER): Payer: Medicare Other | Admitting: Internal Medicine

## 2019-05-07 ENCOUNTER — Inpatient Hospital Stay: Payer: Medicare Other | Admitting: *Deleted

## 2019-05-07 DIAGNOSIS — C9200 Acute myeloblastic leukemia, not having achieved remission: Secondary | ICD-10-CM

## 2019-05-07 DIAGNOSIS — Z452 Encounter for adjustment and management of vascular access device: Secondary | ICD-10-CM

## 2019-05-07 LAB — CBC WITH DIFFERENTIAL/PLATELET
Abs Immature Granulocytes: 0.06 10*3/uL (ref 0.00–0.07)
Basophils Absolute: 0 10*3/uL (ref 0.0–0.1)
Basophils Relative: 1 %
Eosinophils Absolute: 0 10*3/uL (ref 0.0–0.5)
Eosinophils Relative: 1 %
HCT: 27.2 % — ABNORMAL LOW (ref 36.0–46.0)
Hemoglobin: 8.5 g/dL — ABNORMAL LOW (ref 12.0–15.0)
Immature Granulocytes: 3 %
Lymphocytes Relative: 30 %
Lymphs Abs: 0.6 10*3/uL — ABNORMAL LOW (ref 0.7–4.0)
MCH: 27.6 pg (ref 26.0–34.0)
MCHC: 31.3 g/dL (ref 30.0–36.0)
MCV: 88.3 fL (ref 80.0–100.0)
Monocytes Absolute: 1.2 10*3/uL — ABNORMAL HIGH (ref 0.1–1.0)
Monocytes Relative: 59 %
Neutro Abs: 0.1 10*3/uL — ABNORMAL LOW (ref 1.7–7.7)
Neutrophils Relative %: 6 %
Platelets: 7 10*3/uL — CL (ref 150–400)
RBC: 3.08 MIL/uL — ABNORMAL LOW (ref 3.87–5.11)
RDW: 17.1 % — ABNORMAL HIGH (ref 11.5–15.5)
Smear Review: NORMAL
WBC: 1.9 10*3/uL — ABNORMAL LOW (ref 4.0–10.5)
nRBC: 3.1 % — ABNORMAL HIGH (ref 0.0–0.2)

## 2019-05-07 LAB — BASIC METABOLIC PANEL
Anion gap: 9 (ref 5–15)
BUN: 30 mg/dL — ABNORMAL HIGH (ref 8–23)
CO2: 24 mmol/L (ref 22–32)
Calcium: 8.8 mg/dL — ABNORMAL LOW (ref 8.9–10.3)
Chloride: 101 mmol/L (ref 98–111)
Creatinine, Ser: 0.76 mg/dL (ref 0.44–1.00)
GFR calc Af Amer: >60 mL/min (ref >60)
GFR calc non Af Amer: >60 mL/min (ref >60)
Glucose, Bld: 166 mg/dL — ABNORMAL HIGH (ref 70–99)
Potassium: 3.4 mmol/L — ABNORMAL LOW (ref 3.5–5.1)
Sodium: 134 mmol/L — ABNORMAL LOW (ref 135–145)

## 2019-05-07 LAB — LACTATE DEHYDROGENASE: LDH: 730 U/L — ABNORMAL HIGH (ref 98–192)

## 2019-05-07 MED ORDER — ACETAMINOPHEN 325 MG PO TABS
650.0000 mg | ORAL_TABLET | Freq: Once | ORAL | Status: AC
  Administered 2019-05-07: 650 mg via ORAL
  Filled 2019-05-07: qty 2

## 2019-05-07 MED ORDER — SODIUM CHLORIDE 0.9% FLUSH
10.0000 mL | INTRAVENOUS | Status: AC | PRN
  Administered 2019-05-07: 10 mL
  Filled 2019-05-07: qty 10

## 2019-05-07 MED ORDER — SODIUM CHLORIDE 0.9% IV SOLUTION
250.0000 mL | Freq: Once | INTRAVENOUS | Status: AC
  Administered 2019-05-07: 250 mL via INTRAVENOUS
  Filled 2019-05-07: qty 250

## 2019-05-07 MED ORDER — SODIUM CHLORIDE 0.9% FLUSH
10.0000 mL | Freq: Once | INTRAVENOUS | Status: AC
  Administered 2019-05-07: 10 mL via INTRAVENOUS
  Filled 2019-05-07: qty 10

## 2019-05-07 MED ORDER — DIPHENHYDRAMINE HCL 25 MG PO CAPS
25.0000 mg | ORAL_CAPSULE | Freq: Once | ORAL | Status: AC
  Administered 2019-05-07: 25 mg via ORAL
  Filled 2019-05-07: qty 1

## 2019-05-07 MED ORDER — HEPARIN SOD (PORK) LOCK FLUSH 100 UNIT/ML IV SOLN
250.0000 [IU] | INTRAVENOUS | Status: AC | PRN
  Administered 2019-05-07: 250 [IU]
  Filled 2019-05-07: qty 5

## 2019-05-07 NOTE — Progress Notes (Signed)
[facility] OFFICE PROGRESS NOTE  Patient Care Team: Rusty Aus, MD as PCP - General (Internal Medicine)  CHIEF COMPLAINTS/PURPOSE OF CONSULTATION: Acute myeloid leukemia   Oncology History Overview Note  # SEP 2017- MYELOPROLIFERATIVE NEOPLASM- [WBC- 61; normal Hb/platelets] hypercellular bone marrow 90-95% proliferation of myeloid cells in various stages of maturation; relative erythroid hypoplasia and proliferation of atypical megakaryocytes; no increase in blasts; peripheral blood Bcr-Abl-NEG; Cytogenetics- WNL. NEG- Jak-2/MPL/CALR [place] limited- mild splenomegaly [~10cm; 463cm3]; OCT 2017- second opinion at [facility]. Surveillance.   # With progressive leukocytosis we evaluated her a month ago and sent BM for exam. She has a progressive leukocytosis so hydrea '500mg'$  daily was started on 10/20/2016 and increased to 2gm daily then back down to one daily, then 5 days per week over time due to counts drop. Then we held her hydrea since 04/09/2018 due to continued declining Hb and Plt.s  BMB 06/24/18 showed markedly hypercellular BM 95% with myeloid hyperplasia and atypical megakaryocytic hyperplasia. No significant increase in blasts. 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 BCR / ABL negative, FISH normal. Flow Showed 2%CD34-positive myeloid blasts. Myeloid precursors with low side scatter. Pathogenic variants were detected in the ASXL1, CCND2, CUX1, and U2AF1 genes.  # March 2020- wbc- 14/ Hb 9.5/ platelets- 54.   ---------------------------------------------------   # June 8th 2020- Acute myeloid leukemia [peripheral blood flow cytometry;NGS- pending]- June 15th Vidaza [SQ 1-5 + Venatoclax- #1in pt]; tumor lysis prophylaxis  # Day #28 bone marrow biopsy-  7/14- BMx- NO BLASTS; positive for dysplastic changes. OFF Venatoclax [since 7/17] sec to cytopenias.  #817 cycle #2 Vidaza+ venetoclax 100 mg; stop venetoclax on  8/24 [severe pancytopenia]  # 02/18/2019-start gilteritinib 80 mg a day; HELD on 9/14 [sec to severe pan]  # NOV 9th/2020-BMBx- RELAPSED AML; NOV 11th- STARTED GILTERITINIB '40mg'$ /day; NOV17th 2020- PROMACTA '50mg'$ /day  # AUG 2020- AST elevation- 248-?sec to Posonoazole; RESOLVED; continue at lower dose 100 mg/day.   # 2002 [[place]] BREAST CA s/p Lumpect RT; [? Stage I] no chemo s/p AI  # F-ONE HEM: FLT3 ALTERATION NOTED  DIAGNOSIS: ACUTE MYELOID LEUKEMIA GOALS: pallaitive  CURRENT/MOST RECENT THERAPY : Gilteritinib     MDS (myelodysplastic syndrome) ([place]) (Resolved)  08/28/2018 Initial Diagnosis   MDS (myelodysplastic syndrome) ([place])   Acute myeloid leukemia not having achieved remission ([place])  11/25/2018 Initial Diagnosis   AML (acute myeloid leukemia) with failed remission ([place])   11/25/2018 - 12/22/2018 Chemotherapy   The patient had azaCITIDine (VIDAZA) chemo injection 105 mg, 75 mg/m2 = 105 mg, Subcutaneous,  Once, 1 of 4 cycles Administration: 105 mg (11/25/2018), 105 mg (11/26/2018), 105 mg (11/27/2018), 105 mg (11/28/2018), 105 mg (11/29/2018)  for chemotherapy treatment.    01/27/2019 -  Chemotherapy   The patient had palonosetron (ALOXI) injection 0.25 mg, 0.25 mg, Intravenous,  Once, 1 of 4 cycles Administration: 0.25 mg (01/27/2019), 0.25 mg (01/29/2019), 0.25 mg (01/31/2019) azaCITIDine (VIDAZA) 100 mg in sodium chloride 0.9 % 50 mL chemo infusion, 75 mg/m2 = 100 mg, Intravenous, Once, 1 of 4 cycles Administration: 100 mg (01/27/2019), 100 mg (01/28/2019), 100 mg (01/29/2019), 100 mg (01/30/2019), 100 mg (01/31/2019)  for chemotherapy treatment.      HISTORY OF PRESENTING ILLNESS:  Kathryn Hahn 85 y.o.  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  Complains of mild to moderate fatigue.  Shortness of breath on exertion.  However denies any worsening swelling  in the legs or  weight gain.  Appetite is fair.    Review of Systems  Constitutional: Positive for malaise/fatigue. Negative for chills, diaphoresis, fever and weight loss.  HENT: Negative for nosebleeds and sore throat.   Eyes: Negative for double vision.  Respiratory: Positive for shortness of breath. Negative for cough, hemoptysis, sputum production and wheezing.   Cardiovascular: Negative for chest pain, palpitations and orthopnea.  Gastrointestinal: Negative for abdominal pain, blood in stool, constipation, diarrhea, heartburn, melena, nausea and vomiting.  Genitourinary: Negative for dysuria, frequency and urgency.  Musculoskeletal: Negative for back pain and joint pain.  Skin: Negative.  Negative for itching and rash.  Neurological: Negative for dizziness, tingling, focal weakness, weakness and headaches.  Endo/Heme/Allergies: Bruises/bleeds easily.  Psychiatric/Behavioral: Negative for depression. The patient is not nervous/anxious and does not have insomnia.     MEDICAL HISTORY:  Past Medical History:  Diagnosis Date  . Anemia   . Arthritis   . Breast cancer ([place]) 2003   RT LUMPECTOMY  . Edema leg   . History of breast cancer 2003   post lumpectomy  . History of kidney stones   . Hyperlipemia, mixed   . Hypertension, essential   . Leukemia ([place])   . Leukocytosis   . MDS (myelodysplastic syndrome) ([place])   . Personal history of radiation therapy 2003   BREAST CA  . Renal stones   . Vitamin D deficiency     SURGICAL HISTORY: Past Surgical History:  Procedure Laterality Date  . BREAST BIOPSY Right 2003   Postive for cancer  . BREAST LUMPECTOMY Right 2003   BREAST CA  . KIDNEY STONE SURGERY Right     SOCIAL HISTORY: Social History   Socioeconomic History  . Marital status: Married    Spouse name: Not on file  . Number of children: Not on file  . Years of education: Not on file  . Highest education level: Not on file  Occupational History  . Not on file  Social Needs   . Financial resource strain: Not on file  . Food insecurity    Worry: Not on file    Inability: Not on file  . Transportation needs    Medical: Not on file    Non-medical: Not on file  Tobacco Use  . Smoking status: Never Smoker  . Smokeless tobacco: Never Used  Substance and Sexual Activity  . Alcohol use: Yes    Alcohol/week: 3.0 standard drinks    Types: 3 Glasses of wine per week  . Drug use: No  . Sexual activity: Not on file  Lifestyle  . Physical activity    Days per week: Not on file    Minutes per session: Not on file  . Stress: Not on file  Relationships  . Social Herbalist on phone: Not on file    Gets together: Not on file    Attends religious service: Not on file    Active member of club or organization: Not on file    Attends meetings of clubs or organizations: Not on file    Relationship status: Not on file  . Intimate partner violence    Fear of current or ex partner: Not on file    Emotionally abused: Not on file    Physically abused: Not on file    Forced sexual activity: Not on file  Other Topics Concern  . Not on file  Social History Narrative    No smoking/alcohol; lives in [place]  with family.  She lives in assisted living with her husband.    FAMILY HISTORY: Family History  Problem Relation Age of Onset  . Stroke Mother   . Hypertension Father   . Stroke Father   . Breast cancer Neg Hx     ALLERGIES:  is allergic to terbinafine.  MEDICATIONS:  Current Outpatient Medications  Medication Sig Dispense Refill  . acetaminophen (TYLENOL) 325 MG tablet Take 325 mg by mouth every 6 (six) hours as needed for mild pain or moderate pain.     Marland Kitchen acyclovir (ZOVIRAX) 400 MG tablet ONE PILL A DAY [TO PREVENT SHINGLES] 90 tablet 1  . atenolol (TENORMIN) 50 MG tablet Take 1 tablet by mouth 2 (two) times daily.    . calcium carbonate (OSCAL) 1500 (600 Ca) MG TABS tablet Take 600 mg of elemental calcium by mouth daily with breakfast.    .  Cholecalciferol (VITAMIN D) 2000 units tablet Take 1 tablet by mouth daily.    Marland Kitchen eltrombopag (PROMACTA) 50 MG tablet Take 1 tablet (50 mg total) by mouth daily. Take on an empty stomach 1 hour before a meal or 2 hours after 30 tablet 3  . Gilteritinib Fumarate (XOSPATA) 40 MG TABS Take 80 mg by mouth daily. 60 tablet 2  . heparin lock flush 100 UNIT/ML SOLN injection Inject into the vein.    . Infant Care Products (DERMACLOUD) CREA Apply topically.    Marland Kitchen levofloxacin (LEVAQUIN) 250 MG tablet Take 1 tablet (250 mg total) by mouth daily. 30 tablet 3  . losartan-hydrochlorothiazide (HYZAAR) 50-12.5 MG tablet Take 1 tablet by mouth daily.    . magic mouthwash w/lidocaine SOLN Take 5 mLs by mouth 4 (four) times daily as needed for mouth pain. 480 mL 3  . magnesium hydroxide (MILK OF MAGNESIA) 400 MG/5ML suspension Take 5 mLs by mouth every 4 (four) hours as needed.    . ondansetron (ZOFRAN) 8 MG tablet Take by mouth every 8 (eight) hours as needed for nausea or vomiting.    . polyethylene glycol (MIRALAX / GLYCOLAX) 17 g packet Take 17 g by mouth daily.    . posaconazole (NOXAFIL) 100 MG TBEC delayed-release tablet Take 100 mg by mouth daily.    . potassium chloride SA (KLOR-CON) 20 MEQ tablet Take 1 tablet (20 mEq total) by mouth 2 (two) times daily. 10 tablet 0  . senna (SENOKOT) 8.6 MG tablet Take 2 tablets by mouth 2 (two) times daily.     No current facility-administered medications for this visit.    Facility-Administered Medications Ordered in Other Visits  Medication Dose Route Frequency Provider Last Rate Last Dose  . 0.9 %  sodium chloride infusion   Intravenous Continuous Jacquelin Hawking, NP   Stopped at 02/05/19 1630  . heparin lock flush 100 unit/mL  250 Units Intracatheter PRN Charlaine Dalton R, MD      . sodium chloride flush (NS) 0.9 % injection 10 mL  10 mL Intracatheter PRN Cammie Sickle, MD        PHYSICAL EXAMINATION: ECOG PERFORMANCE STATUS: 1 - Symptomatic but  completely ambulatory  Vitals:   05/07/19 0923  BP: (!) 149/82  Pulse: 66  Resp: 16  Temp: (!) 97.2 F (36.2 C)  SpO2: 100%   Filed Weights   05/07/19 0923  Weight: 95 lb (43.1 kg)    Physical Exam  Constitutional: She is oriented to person, place, and time and well-developed, well-nourished, and in no distress.  PICC line in place.  No active bleeding.  Masked.  In a wheelchair.  HENT:  Head: Normocephalic and atraumatic.  Mouth/Throat: Oropharynx is clear and moist. No oropharyngeal exudate.  Eyes: Conjunctivae are normal. No scleral icterus.  Neck: Normal range of motion. Neck supple.  Cardiovascular: Normal rate and regular rhythm.  Pulmonary/Chest: Effort normal. No respiratory distress.  Abdominal: Soft. Bowel sounds are normal. She exhibits no distension and no mass. There is no abdominal tenderness. There is no rebound and no guarding.  Musculoskeletal: Normal range of motion.        General: No tenderness or edema.     Comments: Sitting in wheelchair  Neurological: She is alert and oriented to person, place, and time.  Skin: Skin is warm.  Multiple bruises on lower legs, few on hands and arms. Torso bruising improved/mostly resolved  Psychiatric: Affect normal.    LABORATORY DATA:  I have reviewed the data as listed Lab Results  Component Value Date   WBC 1.9 (L) 05/07/2019   HGB 8.5 (L) 05/07/2019   HCT 27.2 (L) 05/07/2019   MCV 88.3 05/07/2019   PLT 7 (LL) 05/07/2019   Recent Labs    04/07/19 0827 04/14/19 0905  04/28/19 0837 05/05/19 0858 05/07/19 0855  NA 134* 137   < > 132* 134* 134*  K 3.8 3.5   < > 3.5 3.4* 3.4*  CL 100 100   < > 99 101 101  CO2 25 23   < > '25 26 24  '$ GLUCOSE 167* 191*   < > 131* 147* 166*  BUN 22 23   < > 36* 30* 30*  CREATININE 0.50 0.77   < > 0.65 0.73 0.76  CALCIUM 9.1 9.2   < > 9.0 9.0 8.8*  GFRNONAA >60 >60   < > >60 >60 >60  GFRAA >60 >60   < > >60 >60 >60  PROT 6.1* 6.5  --  6.0*  --   --   ALBUMIN 3.6 3.8  --   3.6  --   --   AST 21 25  --  35  --   --   ALT 13 12  --  14  --   --   ALKPHOS 75 74  --  60  --   --   BILITOT 0.7 0.5  --  0.4  --   --    < > = values in this interval not displayed.    RADIOGRAPHIC STUDIES: I have personally reviewed the radiological images as listed and agreed with the findings in the report. Ct Bone Marrow Biopsy & Aspiration  Result Date: 04/21/2019 CLINICAL DATA:  Anemia in the setting of AML EXAM: CT GUIDED DEEP ILIAC BONE ASPIRATION AND CORE BIOPSY TECHNIQUE: Patient was placed prone on the CT gantry and limited axial scans through the pelvis were obtained. Appropriate skin entry site was identified. Skin site was marked, prepped with chlorhexidine, draped in usual sterile fashion, and infiltrated locally with 1% lidocaine. Intravenous Fentanyl 7mg and Versed '1mg'$  were administered as conscious sedation during continuous monitoring of the patient's level of consciousness and physiological / cardiorespiratory status by the radiology RN, with a total moderate sedation time of 11 minutes. Under CT fluoroscopic guidance an 11-gauge Cook trocar bone needle was advanced into the right iliac bone just lateral to the sacroiliac joint. Once needle tip position was confirmed, core and aspiration samples were obtained, submitted to pathology for approval. Patient tolerated procedure well. COMPLICATIONS: COMPLICATIONS none IMPRESSION: 1. Technically successful CT guided  right iliac bone core and aspiration biopsy. Electronically Signed   By: Lucrezia Europe M.D.   On: 04/21/2019 09:36    ASSESSMENT & PLAN:   Acute myeloid leukemia not having achieved remission ([place]) #Relapsed acute myeloid leukemia with monocytic differentiation-bone marrow biopsy, April 22, 2019-relapse 50 to 60% involvement.  NGS-pending.   # currently on gilteritinib-40 mg a day escalate as tolerated.  also Promacta.   #Leucopenia/ anemia/thrombocytopenia-White count 2.5 /hemoglobin 7.1 platelets 7:   secondary to underlying acute myeloid leukemia/Gilteritinib.   Proceed with platelet transfusion/PRBC transfusion.    #Supportive care for AML-  -Continue Noxafil/posaconazole '100mg'$  and acyclovir 400 mg, and levaquin 250 mg for prophylaxis. Continue PICC line dressing changes weekly  #Spoke to patient husband regarding plan of care.  In agreement.   Disposition: # Transfuse 2 unit of platelets today; NO PRBC.  # appt on 11/30 as planned.  # Follow up- Dec 2nd; MD- labs-cbc/bmp;LDH; possible 1 unit platelets/1 unit PRBC transfusion- Dr.B

## 2019-05-07 NOTE — Assessment & Plan Note (Addendum)
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  NGS-pending.   # currently on gilteritinib-40 mg a day escalate as tolerated.  also Promacta.   #Leucopenia/ anemia/thrombocytopenia-White count 2.5 /hemoglobin 7.1 platelets 7:  secondary to underlying acute myeloid leukemia/Gilteritinib.   Proceed with platelet transfusion/PRBC transfusion.    #Supportive care for AML-  -Continue Noxafil/posaconazole '100mg'$  and acyclovir 400 mg, and levaquin 250 mg for prophylaxis. Continue PICC line dressing changes weekly  #Spoke to patient husband regarding plan of care.  In agreement.   Disposition: # Transfuse 2 unit of platelets today; NO PRBC.  # appt on 11/30 as planned.  # Follow up- Dec 2nd; MD- labs-cbc/bmp;LDH; possible 1 unit platelets/1 unit PRBC transfusion- Dr.B

## 2019-05-07 NOTE — Telephone Encounter (Signed)
...    Ref Range & Units 08:55 2d ago 9d ago  Potassium 3.5 - 5.1 mmol/L 3.4Low   3.4Low   3.5

## 2019-05-08 LAB — PREPARE PLATELET PHERESIS
Unit division: 0
Unit division: 0

## 2019-05-08 LAB — BPAM PLATELET PHERESIS
Blood Product Expiration Date: 202011272359
Blood Product Expiration Date: 202011272359
ISSUE DATE / TIME: 202011251136
ISSUE DATE / TIME: 202011251238
Unit Type and Rh: 5100
Unit Type and Rh: 5100

## 2019-05-09 ENCOUNTER — Encounter (HOSPITAL_COMMUNITY): Payer: Self-pay | Admitting: Internal Medicine

## 2019-05-12 ENCOUNTER — Encounter: Payer: Self-pay | Admitting: Internal Medicine

## 2019-05-12 ENCOUNTER — Other Ambulatory Visit: Payer: Self-pay

## 2019-05-12 ENCOUNTER — Inpatient Hospital Stay: Payer: Medicare Other

## 2019-05-12 ENCOUNTER — Other Ambulatory Visit: Payer: Self-pay | Admitting: *Deleted

## 2019-05-12 ENCOUNTER — Inpatient Hospital Stay (HOSPITAL_BASED_OUTPATIENT_CLINIC_OR_DEPARTMENT_OTHER): Payer: Medicare Other | Admitting: Internal Medicine

## 2019-05-12 ENCOUNTER — Other Ambulatory Visit: Payer: Self-pay | Admitting: Licensed Clinical Social Worker

## 2019-05-12 ENCOUNTER — Inpatient Hospital Stay: Payer: Medicare Other | Admitting: *Deleted

## 2019-05-12 DIAGNOSIS — C9200 Acute myeloblastic leukemia, not having achieved remission: Secondary | ICD-10-CM

## 2019-05-12 DIAGNOSIS — Z95828 Presence of other vascular implants and grafts: Secondary | ICD-10-CM

## 2019-05-12 LAB — CBC WITH DIFFERENTIAL/PLATELET
Abs Immature Granulocytes: 0 10*3/uL (ref 0.00–0.07)
Basophils Absolute: 0 10*3/uL (ref 0.0–0.1)
Basophils Relative: 0 %
Blasts: 24 %
Eosinophils Absolute: 0 10*3/uL (ref 0.0–0.5)
Eosinophils Relative: 1 %
HCT: 25.7 % — ABNORMAL LOW (ref 36.0–46.0)
Hemoglobin: 8.1 g/dL — ABNORMAL LOW (ref 12.0–15.0)
Lymphocytes Relative: 47 %
Lymphs Abs: 0.7 10*3/uL (ref 0.7–4.0)
MCH: 27.6 pg (ref 26.0–34.0)
MCHC: 31.5 g/dL (ref 30.0–36.0)
MCV: 87.7 fL (ref 80.0–100.0)
Metamyelocytes Relative: 1 %
Monocytes Absolute: 0.3 10*3/uL (ref 0.1–1.0)
Monocytes Relative: 19 %
Myelocytes: 2 %
Neutro Abs: 0.1 10*3/uL — ABNORMAL LOW (ref 1.7–7.7)
Neutrophils Relative %: 6 %
Platelets: 7 10*3/uL — CL (ref 150–400)
RBC: 2.93 MIL/uL — ABNORMAL LOW (ref 3.87–5.11)
RDW: 17.5 % — ABNORMAL HIGH (ref 11.5–15.5)
Smear Review: DECREASED
WBC: 1.4 10*3/uL — CL (ref 4.0–10.5)
nRBC: 3.7 % — ABNORMAL HIGH (ref 0.0–0.2)

## 2019-05-12 LAB — LACTATE DEHYDROGENASE: LDH: 675 U/L — ABNORMAL HIGH (ref 98–192)

## 2019-05-12 LAB — BASIC METABOLIC PANEL
Anion gap: 10 (ref 5–15)
BUN: 33 mg/dL — ABNORMAL HIGH (ref 8–23)
CO2: 25 mmol/L (ref 22–32)
Calcium: 9.1 mg/dL (ref 8.9–10.3)
Chloride: 101 mmol/L (ref 98–111)
Creatinine, Ser: 0.76 mg/dL (ref 0.44–1.00)
GFR calc Af Amer: >60 mL/min (ref >60)
GFR calc non Af Amer: >60 mL/min (ref >60)
Glucose, Bld: 188 mg/dL — ABNORMAL HIGH (ref 70–99)
Potassium: 3.5 mmol/L (ref 3.5–5.1)
Sodium: 136 mmol/L (ref 135–145)

## 2019-05-12 LAB — SAMPLE TO BLOOD BANK

## 2019-05-12 MED ORDER — ACETAMINOPHEN 325 MG PO TABS
650.0000 mg | ORAL_TABLET | Freq: Once | ORAL | Status: AC
  Administered 2019-05-12: 650 mg via ORAL
  Filled 2019-05-12: qty 2

## 2019-05-12 MED ORDER — DIPHENHYDRAMINE HCL 25 MG PO CAPS
25.0000 mg | ORAL_CAPSULE | Freq: Once | ORAL | Status: AC
  Administered 2019-05-12: 25 mg via ORAL
  Filled 2019-05-12: qty 1

## 2019-05-12 MED ORDER — HEPARIN SOD (PORK) LOCK FLUSH 100 UNIT/ML IV SOLN
250.0000 [IU] | Freq: Once | INTRAVENOUS | Status: AC
  Administered 2019-05-12: 250 [IU] via INTRAVENOUS
  Filled 2019-05-12: qty 5

## 2019-05-12 MED ORDER — SODIUM CHLORIDE 0.9% IV SOLUTION
250.0000 mL | Freq: Once | INTRAVENOUS | Status: AC
  Administered 2019-05-12: 250 mL via INTRAVENOUS
  Filled 2019-05-12: qty 250

## 2019-05-12 MED ORDER — POTASSIUM CHLORIDE CRYS ER 20 MEQ PO TBCR: 20.0000 meq | EXTENDED_RELEASE_TABLET | Freq: Two times a day (BID) | ORAL | 3 refills | Status: DC

## 2019-05-12 MED ORDER — SODIUM CHLORIDE 0.9% FLUSH
10.0000 mL | Freq: Once | INTRAVENOUS | Status: AC
  Administered 2019-05-12: 10 mL via INTRAVENOUS
  Filled 2019-05-12: qty 10

## 2019-05-12 NOTE — Progress Notes (Signed)
[place] OFFICE PROGRESS NOTE  Patient Care Team: Rusty Aus, MD as PCP - General (Internal Medicine)  CHIEF COMPLAINTS/PURPOSE OF CONSULTATION: Acute myeloid leukemia   Oncology History Overview Note  # SEP 2017- MYELOPROLIFERATIVE NEOPLASM- [WBC- 79; normal Hb/platelets] hypercellular bone marrow 90-95% proliferation of myeloid cells in various stages of maturation; relative erythroid hypoplasia and proliferation of atypical megakaryocytes; no increase in blasts; peripheral blood Bcr-Abl-NEG; Cytogenetics- WNL. NEG- Jak-2/MPL/CALR [place] limited- mild splenomegaly [~10cm; 463cm3]; OCT 2017- second opinion at [facility]. Surveillance.   # With progressive leukocytosis we evaluated her a month ago and sent BM for exam. She has a progressive leukocytosis so hydrea '500mg'$  daily was started on 10/20/2016 and increased to 2gm daily then back down to one daily, then 5 days per week over time due to counts drop. Then we held her hydrea since 04/09/2018 due to continued declining Hb and Plt.s  BMB 06/24/18 showed markedly hypercellular BM 95% with myeloid hyperplasia and atypical megakaryocytic hyperplasia. No significant increase in blasts. 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 BCR / ABL negative, FISH normal. Flow Showed 2%CD34-positive myeloid blasts. Myeloid precursors with low side scatter. Pathogenic variants were detected in the ASXL1, CCND2, CUX1, and U2AF1 genes.  # March 2020- wbc- 14/ Hb 9.5/ platelets- 54.   ---------------------------------------------------   # June 8th 2020- Acute myeloid leukemia [peripheral blood flow cytometry;NGS- pending]- June 15th Vidaza [SQ 1-5 + Venatoclax- #1in pt]; tumor lysis prophylaxis  # Day #28 bone marrow biopsy-  7/14- BMx- NO BLASTS; positive for dysplastic changes. OFF Venatoclax [since 7/17] sec to cytopenias.  #817 cycle #2 Vidaza+ venetoclax 100 mg; stop venetoclax on  8/24 [severe pancytopenia]  # 02/18/2019-start gilteritinib 80 mg a day; HELD on 9/14 [sec to severe pan]  # NOV 9th/2020-BMBx- RELAPSED AML; NOV 11th- STARTED GILTERITINIB '40mg'$ /day; NOV17th 2020- PROMACTA '50mg'$ /day; NOV 30th- inrease Gilt to 80 mg/day  # AUG 2020- AST elevation- 248-?sec to Posonoazole; RESOLVED; continue at lower dose 100 mg/day.   # 2002 [[place]] BREAST CA s/p Lumpect RT; [? Stage I] no chemo s/p AI  # F-ONE HEM: FLT3 ALTERATION NOTED  DIAGNOSIS: ACUTE MYELOID LEUKEMIA GOALS: pallaitive  CURRENT/MOST RECENT THERAPY : Gilteritinib     MDS (myelodysplastic syndrome) ([place]) (Resolved)  08/28/2018 Initial Diagnosis   MDS (myelodysplastic syndrome) ([place])   Acute myeloid leukemia not having achieved remission ([place])  11/25/2018 Initial Diagnosis   AML (acute myeloid leukemia) with failed remission ([place])   11/25/2018 - 12/22/2018 Chemotherapy   The patient had azaCITIDine (VIDAZA) chemo injection 105 mg, 75 mg/m2 = 105 mg, Subcutaneous,  Once, 1 of 4 cycles Administration: 105 mg (11/25/2018), 105 mg (11/26/2018), 105 mg (11/27/2018), 105 mg (11/28/2018), 105 mg (11/29/2018)  for chemotherapy treatment.    01/27/2019 -  Chemotherapy   The patient had palonosetron (ALOXI) injection 0.25 mg, 0.25 mg, Intravenous,  Once, 1 of 4 cycles Administration: 0.25 mg (01/27/2019), 0.25 mg (01/29/2019), 0.25 mg (01/31/2019) azaCITIDine (VIDAZA) 100 mg in sodium chloride 0.9 % 50 mL chemo infusion, 75 mg/m2 = 100 mg, Intravenous, Once, 1 of 4 cycles Administration: 100 mg (01/27/2019), 100 mg (01/28/2019), 100 mg (01/29/2019), 100 mg (01/30/2019), 100 mg (01/31/2019)  for chemotherapy treatment.      HISTORY OF PRESENTING ILLNESS:  Kathryn Hahn 85 y.o.  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  Patient states episode of gum bleeding spontaneous 2 days ago.  Resolved with mouthwash.   Continues have easy bruising.  Denies any blood in stools or black or stools.  No nausea or vomiting.  Appetite is fair.  Review of Systems  Constitutional: Positive for malaise/fatigue. Negative for chills, diaphoresis, fever and weight loss.  HENT: Negative for nosebleeds and sore throat.   Eyes: Negative for double vision.  Respiratory: Positive for shortness of breath. Negative for cough, hemoptysis, sputum production and wheezing.   Cardiovascular: Negative for chest pain, palpitations and orthopnea.  Gastrointestinal: Negative for abdominal pain, blood in stool, constipation, diarrhea, heartburn, melena, nausea and vomiting.  Genitourinary: Negative for dysuria, frequency and urgency.  Musculoskeletal: Negative for back pain and joint pain.  Skin: Negative.  Negative for itching and rash.  Neurological: Negative for dizziness, tingling, focal weakness, weakness and headaches.  Endo/Heme/Allergies: Bruises/bleeds easily.  Psychiatric/Behavioral: Negative for depression. The patient is not nervous/anxious and does not have insomnia.     MEDICAL HISTORY:  Past Medical History:  Diagnosis Date  . Anemia   . Arthritis   . Breast cancer ([place]) 2003   RT LUMPECTOMY  . Edema leg   . History of breast cancer 2003   post lumpectomy  . History of kidney stones   . Hyperlipemia, mixed   . Hypertension, essential   . Leukemia ([place])   . Leukocytosis   . MDS (myelodysplastic syndrome) ([place])   . Personal history of radiation therapy 2003   BREAST CA  . Renal stones   . Vitamin D deficiency     SURGICAL HISTORY: Past Surgical History:  Procedure Laterality Date  . BREAST BIOPSY Right 2003   Postive for cancer  . BREAST LUMPECTOMY Right 2003   BREAST CA  . KIDNEY STONE SURGERY Right     SOCIAL HISTORY: Social History   Socioeconomic History  . Marital status: Married    Spouse name: Not on file  . Number of children: Not on file  . Years of education: Not on file  .  Highest education level: Not on file  Occupational History  . Not on file  Social Needs  . Financial resource strain: Not on file  . Food insecurity    Worry: Not on file    Inability: Not on file  . Transportation needs    Medical: Not on file    Non-medical: Not on file  Tobacco Use  . Smoking status: Never Smoker  . Smokeless tobacco: Never Used  Substance and Sexual Activity  . Alcohol use: Yes    Alcohol/week: 3.0 standard drinks    Types: 3 Glasses of wine per week  . Drug use: No  . Sexual activity: Not on file  Lifestyle  . Physical activity    Days per week: Not on file    Minutes per session: Not on file  . Stress: Not on file  Relationships  . Social Herbalist on phone: Not on file    Gets together: Not on file    Attends religious service: Not on file    Active member of club or organization: Not on file    Attends meetings of clubs or organizations: Not on file    Relationship status: Not on file  . Intimate partner violence    Fear of current or ex partner: Not on file    Emotionally abused: Not on file    Physically abused: Not on file    Forced sexual activity: Not on file  Other Topics Concern  .  Not on file  Social History Narrative    No smoking/alcohol; lives in [place] with family.  She lives in assisted living with her husband.    FAMILY HISTORY: Family History  Problem Relation Age of Onset  . Stroke Mother   . Hypertension Father   . Stroke Father   . Breast cancer Neg Hx     ALLERGIES:  is allergic to terbinafine.  MEDICATIONS:  Current Outpatient Medications  Medication Sig Dispense Refill  . acetaminophen (TYLENOL) 325 MG tablet Take 325 mg by mouth every 6 (six) hours as needed for mild pain or moderate pain.     Marland Kitchen acyclovir (ZOVIRAX) 400 MG tablet ONE PILL A DAY [TO PREVENT SHINGLES] 90 tablet 1  . atenolol (TENORMIN) 50 MG tablet Take 1 tablet by mouth 2 (two) times daily.    . calcium carbonate (OSCAL) 1500 (600  Ca) MG TABS tablet Take 600 mg of elemental calcium by mouth daily with breakfast.    . Cholecalciferol (VITAMIN D) 2000 units tablet Take 1 tablet by mouth daily.    Marland Kitchen eltrombopag (PROMACTA) 50 MG tablet Take 1 tablet (50 mg total) by mouth daily. Take on an empty stomach 1 hour before a meal or 2 hours after 30 tablet 3  . Gilteritinib Fumarate (XOSPATA) 40 MG TABS Take 80 mg by mouth daily. 60 tablet 2  . heparin lock flush 100 UNIT/ML SOLN injection Inject into the vein.    . Infant Care Products (DERMACLOUD) CREA Apply topically.    Marland Kitchen levofloxacin (LEVAQUIN) 250 MG tablet Take 1 tablet (250 mg total) by mouth daily. 30 tablet 3  . losartan-hydrochlorothiazide (HYZAAR) 50-12.5 MG tablet Take 1 tablet by mouth daily.    . magic mouthwash w/lidocaine SOLN Take 5 mLs by mouth 4 (four) times daily as needed for mouth pain. 480 mL 3  . magnesium hydroxide (MILK OF MAGNESIA) 400 MG/5ML suspension Take 5 mLs by mouth every 4 (four) hours as needed.    . ondansetron (ZOFRAN) 8 MG tablet Take by mouth every 8 (eight) hours as needed for nausea or vomiting.    . polyethylene glycol (MIRALAX / GLYCOLAX) 17 g packet Take 17 g by mouth daily.    . posaconazole (NOXAFIL) 100 MG TBEC delayed-release tablet Take 100 mg by mouth daily.    Marland Kitchen senna (SENOKOT) 8.6 MG tablet Take 2 tablets by mouth 2 (two) times daily.    . potassium chloride SA (KLOR-CON M20) 20 MEQ tablet Take 1 tablet (20 mEq total) by mouth 2 (two) times daily. 30 tablet 3   No current facility-administered medications for this visit.    Facility-Administered Medications Ordered in Other Visits  Medication Dose Route Frequency Provider Last Rate Last Dose  . 0.9 %  sodium chloride infusion   Intravenous Continuous Jacquelin Hawking, NP   Stopped at 02/05/19 1630    PHYSICAL EXAMINATION: ECOG PERFORMANCE STATUS: 1 - Symptomatic but completely ambulatory  Vitals:   05/07/19 1334  BP: 140/76  Pulse: 67  Temp: (!) 96.9 F (36.1 C)    Filed Weights   05/07/19 1334  Weight: 95 lb 4 oz (43.2 kg)    Physical Exam  Constitutional: She is oriented to person, place, and time and well-developed, well-nourished, and in no distress.  PICC line in place.  No active bleeding.  Masked.  In a wheelchair.  HENT:  Head: Normocephalic and atraumatic.  Mouth/Throat: Oropharynx is clear and moist. No oropharyngeal exudate.  Eyes: Conjunctivae are normal. No  scleral icterus.  Neck: Normal range of motion. Neck supple.  Cardiovascular: Normal rate and regular rhythm.  Pulmonary/Chest: Effort normal. No respiratory distress.  Abdominal: Soft. Bowel sounds are normal. She exhibits no distension and no mass. There is no abdominal tenderness. There is no rebound and no guarding.  Musculoskeletal: Normal range of motion.        General: No tenderness or edema.     Comments: Sitting in wheelchair  Neurological: She is alert and oriented to person, place, and time.  Skin: Skin is warm.  Multiple bruises on lower legs, few on hands and arms. Torso bruising improved/mostly resolved  Psychiatric: Affect normal.    LABORATORY DATA:  I have reviewed the data as listed Lab Results  Component Value Date   WBC 1.4 (LL) 05/12/2019   HGB 8.1 (L) 05/12/2019   HCT 25.7 (L) 05/12/2019   MCV 87.7 05/12/2019   PLT 7 (LL) 05/12/2019   Recent Labs    04/07/19 0827 04/14/19 0905  04/28/19 0837 05/05/19 0858 05/07/19 0855 05/12/19 0822  NA 134* 137   < > 132* 134* 134* 136  K 3.8 3.5   < > 3.5 3.4* 3.4* 3.5  CL 100 100   < > 99 101 101 101  CO2 25 23   < > '25 26 24 25  '$ GLUCOSE 167* 191*   < > 131* 147* 166* 188*  BUN 22 23   < > 36* 30* 30* 33*  CREATININE 0.50 0.77   < > 0.65 0.73 0.76 0.76  CALCIUM 9.1 9.2   < > 9.0 9.0 8.8* 9.1  GFRNONAA >60 >60   < > >60 >60 >60 >60  GFRAA >60 >60   < > >60 >60 >60 >60  PROT 6.1* 6.5  --  6.0*  --   --   --   ALBUMIN 3.6 3.8  --  3.6  --   --   --   AST 21 25  --  35  --   --   --   ALT 13 12   --  14  --   --   --   ALKPHOS 75 74  --  60  --   --   --   BILITOT 0.7 0.5  --  0.4  --   --   --    < > = values in this interval not displayed.    RADIOGRAPHIC STUDIES: I have personally reviewed the radiological images as listed and agreed with the findings in the report. Ct Bone Marrow Biopsy & Aspiration  Result Date: 04/21/2019 CLINICAL DATA:  Anemia in the setting of AML EXAM: CT GUIDED DEEP ILIAC BONE ASPIRATION AND CORE BIOPSY TECHNIQUE: Patient was placed prone on the CT gantry and limited axial scans through the pelvis were obtained. Appropriate skin entry site was identified. Skin site was marked, prepped with chlorhexidine, draped in usual sterile fashion, and infiltrated locally with 1% lidocaine. Intravenous Fentanyl 8mg and Versed '1mg'$  were administered as conscious sedation during continuous monitoring of the patient's level of consciousness and physiological / cardiorespiratory status by the radiology RN, with a total moderate sedation time of 11 minutes. Under CT fluoroscopic guidance an 11-gauge Cook trocar bone needle was advanced into the right iliac bone just lateral to the sacroiliac joint. Once needle tip position was confirmed, core and aspiration samples were obtained, submitted to pathology for approval. Patient tolerated procedure well. COMPLICATIONS: COMPLICATIONS none IMPRESSION: 1. Technically successful CT guided right iliac  bone core and aspiration biopsy. Electronically Signed   By: Lucrezia Europe M.D.   On: 04/21/2019 09:36    ASSESSMENT & PLAN:   Acute myeloid leukemia not having achieved remission ([place]) #Relapsed acute myeloid leukemia with monocytic differentiation-bone marrow biopsy, April 22, 2019-relapse 50 to 60% involvement.  NGS-FLT3 POSITIVE  # currently on gilteritinib-40 mg a day; Also Promacta. Increase Gilteritinib to 80 mg/day; starting today. Discussed increasing bone marrow suppression on higher dose.   #Leucopenia/  anemia/thrombocytopenia-White count 1.9 /hemoglobin 8.1 platelets 7:  secondary to underlying acute myeloid leukemia/Gilteritinib.   Proceed with platelet transfusion/PRBC as needed.  #Supportive care for AML-  -Continue Noxafil/posaconazole '100mg'$  and acyclovir 400 mg, and levaquin 250 mg for prophylaxis. Continue PICC line dressing changes weekly  #Spoke to patient husband regarding plan of care.  In agreement.   Disposition: # Transfuse 1 unit of platelets today; NO PRBC.  # cancel MD apt on dec 3rd  # Follow up- Dec 7th; MD- labs-cbc/bmp;LDH; possible 1 unit platelets/1 unit PRBC transfusion- # 12/10- labs- hold tube'cbc; possible 1 unit platelets/1 unit PRBC transfusion-- Dr.B

## 2019-05-12 NOTE — Assessment & Plan Note (Addendum)
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  NGS-FLT3 POSITIVE  # currently on gilteritinib-40 mg a day; Also Promacta. Increase Gilteritinib to 80 mg/day; starting today. Discussed increasing bone marrow suppression on higher dose.   #Leucopenia/ anemia/thrombocytopenia-White count 1.9 /hemoglobin 8.1 platelets 7:  secondary to underlying acute myeloid leukemia/Gilteritinib.   Proceed with platelet transfusion/PRBC as needed.  #Supportive care for AML-  -Continue Noxafil/posaconazole 170m and acyclovir 400 mg, and levaquin 250 mg for prophylaxis. Continue PICC line dressing changes weekly  #Spoke to patient husband regarding plan of care.  In agreement.   Disposition: # Transfuse 1 unit of platelets today; NO PRBC.  # cancel MD apt on dec 3rd  # Follow up- Dec 7th; MD- labs-cbc/bmp;LDH; possible 1 unit platelets/1 unit PRBC transfusion- # 12/10- labs- hold tube'cbc; possible 1 unit platelets/1 unit PRBC transfusion-- Dr.B

## 2019-05-13 LAB — BPAM PLATELET PHERESIS
Blood Product Expiration Date: 202012032359
ISSUE DATE / TIME: 202011301119
Unit Type and Rh: 5100

## 2019-05-13 LAB — PREPARE PLATELET PHERESIS: Unit division: 0

## 2019-05-15 ENCOUNTER — Other Ambulatory Visit: Payer: Self-pay

## 2019-05-15 ENCOUNTER — Inpatient Hospital Stay: Payer: Medicare Other | Attending: Internal Medicine | Admitting: Internal Medicine

## 2019-05-15 ENCOUNTER — Ambulatory Visit: Payer: Medicare Other | Admitting: Internal Medicine

## 2019-05-15 ENCOUNTER — Inpatient Hospital Stay: Payer: Medicare Other

## 2019-05-15 ENCOUNTER — Other Ambulatory Visit: Payer: Self-pay | Admitting: Licensed Clinical Social Worker

## 2019-05-15 DIAGNOSIS — E782 Mixed hyperlipidemia: Secondary | ICD-10-CM | POA: Diagnosis not present

## 2019-05-15 DIAGNOSIS — C9202 Acute myeloblastic leukemia, in relapse: Secondary | ICD-10-CM | POA: Diagnosis present

## 2019-05-15 DIAGNOSIS — D696 Thrombocytopenia, unspecified: Secondary | ICD-10-CM | POA: Insufficient documentation

## 2019-05-15 DIAGNOSIS — C9200 Acute myeloblastic leukemia, not having achieved remission: Secondary | ICD-10-CM

## 2019-05-15 DIAGNOSIS — Z452 Encounter for adjustment and management of vascular access device: Secondary | ICD-10-CM

## 2019-05-15 DIAGNOSIS — I10 Essential (primary) hypertension: Secondary | ICD-10-CM | POA: Insufficient documentation

## 2019-05-15 DIAGNOSIS — Z95828 Presence of other vascular implants and grafts: Secondary | ICD-10-CM

## 2019-05-15 DIAGNOSIS — M1990 Unspecified osteoarthritis, unspecified site: Secondary | ICD-10-CM | POA: Insufficient documentation

## 2019-05-15 DIAGNOSIS — Z79899 Other long term (current) drug therapy: Secondary | ICD-10-CM | POA: Diagnosis not present

## 2019-05-15 LAB — CBC WITH DIFFERENTIAL/PLATELET
Abs Immature Granulocytes: 0 10*3/uL (ref 0.00–0.07)
Basophils Absolute: 0 10*3/uL (ref 0.0–0.1)
Basophils Relative: 0 %
Blasts: 17 %
Eosinophils Absolute: 0 10*3/uL (ref 0.0–0.5)
Eosinophils Relative: 0 %
HCT: 24.7 % — ABNORMAL LOW (ref 36.0–46.0)
Hemoglobin: 7.7 g/dL — ABNORMAL LOW (ref 12.0–15.0)
Lymphocytes Relative: 46 %
Lymphs Abs: 0.7 10*3/uL (ref 0.7–4.0)
MCH: 27.7 pg (ref 26.0–34.0)
MCHC: 31.2 g/dL (ref 30.0–36.0)
MCV: 88.8 fL (ref 80.0–100.0)
Monocytes Absolute: 0.3 10*3/uL (ref 0.1–1.0)
Monocytes Relative: 23 %
Neutro Abs: 0.2 10*3/uL — ABNORMAL LOW (ref 1.7–7.7)
Neutrophils Relative %: 14 %
Platelets: 10 10*3/uL — CL (ref 150–400)
RBC: 2.78 MIL/uL — ABNORMAL LOW (ref 3.87–5.11)
RDW: 17.5 % — ABNORMAL HIGH (ref 11.5–15.5)
Smear Review: DECREASED
WBC: 1.5 10*3/uL — ABNORMAL LOW (ref 4.0–10.5)
nRBC: 3.4 % — ABNORMAL HIGH (ref 0.0–0.2)

## 2019-05-15 LAB — SAMPLE TO BLOOD BANK

## 2019-05-15 LAB — PREPARE RBC (CROSSMATCH)

## 2019-05-15 MED ORDER — SODIUM CHLORIDE 0.9% FLUSH
10.0000 mL | Freq: Once | INTRAVENOUS | Status: AC
  Administered 2019-05-15: 10 mL via INTRAVENOUS
  Filled 2019-05-15: qty 10

## 2019-05-15 MED ORDER — ACETAMINOPHEN 325 MG PO TABS
650.0000 mg | ORAL_TABLET | Freq: Once | ORAL | Status: AC
  Administered 2019-05-15: 650 mg via ORAL
  Filled 2019-05-15: qty 2

## 2019-05-15 MED ORDER — SODIUM CHLORIDE 0.9% IV SOLUTION
250.0000 mL | Freq: Once | INTRAVENOUS | Status: AC
  Administered 2019-05-15: 10:00:00 250 mL via INTRAVENOUS
  Filled 2019-05-15: qty 250

## 2019-05-15 MED ORDER — HEPARIN SOD (PORK) LOCK FLUSH 100 UNIT/ML IV SOLN
500.0000 [IU] | Freq: Once | INTRAVENOUS | Status: AC
  Administered 2019-05-15: 500 [IU] via INTRAVENOUS

## 2019-05-15 NOTE — Progress Notes (Signed)
Blood transfusion/platelets- today

## 2019-05-16 ENCOUNTER — Telehealth: Payer: Self-pay | Admitting: Pharmacy Technician

## 2019-05-16 LAB — TYPE AND SCREEN
ABO/RH(D): O POS
Antibody Screen: NEGATIVE
Unit division: 0

## 2019-05-16 LAB — BPAM PLATELET PHERESIS
Blood Product Expiration Date: 202012062359
ISSUE DATE / TIME: 202012031312
Unit Type and Rh: 600

## 2019-05-16 LAB — BPAM RBC
Blood Product Expiration Date: 202012082359
ISSUE DATE / TIME: 202012031116
Unit Type and Rh: 9500

## 2019-05-16 LAB — PREPARE PLATELET PHERESIS: Unit division: 0

## 2019-05-19 ENCOUNTER — Other Ambulatory Visit: Payer: Self-pay

## 2019-05-19 ENCOUNTER — Inpatient Hospital Stay (HOSPITAL_BASED_OUTPATIENT_CLINIC_OR_DEPARTMENT_OTHER): Payer: Medicare Other | Admitting: Internal Medicine

## 2019-05-19 ENCOUNTER — Other Ambulatory Visit: Payer: Self-pay | Admitting: Licensed Clinical Social Worker

## 2019-05-19 ENCOUNTER — Inpatient Hospital Stay: Payer: Medicare Other

## 2019-05-19 ENCOUNTER — Inpatient Hospital Stay: Payer: Medicare Other | Admitting: *Deleted

## 2019-05-19 ENCOUNTER — Other Ambulatory Visit: Payer: Self-pay | Admitting: *Deleted

## 2019-05-19 DIAGNOSIS — C9200 Acute myeloblastic leukemia, not having achieved remission: Secondary | ICD-10-CM

## 2019-05-19 DIAGNOSIS — D696 Thrombocytopenia, unspecified: Secondary | ICD-10-CM

## 2019-05-19 DIAGNOSIS — C9202 Acute myeloblastic leukemia, in relapse: Secondary | ICD-10-CM | POA: Diagnosis not present

## 2019-05-19 DIAGNOSIS — Z452 Encounter for adjustment and management of vascular access device: Secondary | ICD-10-CM

## 2019-05-19 LAB — CBC WITH DIFFERENTIAL/PLATELET
Abs Immature Granulocytes: 0 10*3/uL (ref 0.00–0.07)
Basophils Absolute: 0 10*3/uL (ref 0.0–0.1)
Basophils Relative: 0 %
Blasts: 32 %
Eosinophils Absolute: 0 10*3/uL (ref 0.0–0.5)
Eosinophils Relative: 0 %
HCT: 26 % — ABNORMAL LOW (ref 36.0–46.0)
Hemoglobin: 8.3 g/dL — ABNORMAL LOW (ref 12.0–15.0)
Lymphocytes Relative: 37 %
Lymphs Abs: 0.6 10*3/uL — ABNORMAL LOW (ref 0.7–4.0)
MCH: 28.3 pg (ref 26.0–34.0)
MCHC: 31.9 g/dL (ref 30.0–36.0)
MCV: 88.7 fL (ref 80.0–100.0)
Metamyelocytes Relative: 1 %
Monocytes Absolute: 0.2 10*3/uL (ref 0.1–1.0)
Monocytes Relative: 15 %
Myelocytes: 2 %
Neutro Abs: 0.2 10*3/uL — ABNORMAL LOW (ref 1.7–7.7)
Neutrophils Relative %: 13 %
Platelets: 10 10*3/uL — CL (ref 150–400)
RBC: 2.93 MIL/uL — ABNORMAL LOW (ref 3.87–5.11)
RDW: 18.1 % — ABNORMAL HIGH (ref 11.5–15.5)
Smear Review: NORMAL
WBC: 1.6 10*3/uL — ABNORMAL LOW (ref 4.0–10.5)
nRBC: 5.8 % — ABNORMAL HIGH (ref 0.0–0.2)

## 2019-05-19 LAB — COMPREHENSIVE METABOLIC PANEL
ALT: 23 U/L (ref 0–44)
AST: 40 U/L (ref 15–41)
Albumin: 3.5 g/dL (ref 3.5–5.0)
Alkaline Phosphatase: 70 U/L (ref 38–126)
Anion gap: 10 (ref 5–15)
BUN: 30 mg/dL — ABNORMAL HIGH (ref 8–23)
CO2: 26 mmol/L (ref 22–32)
Calcium: 9.1 mg/dL (ref 8.9–10.3)
Chloride: 102 mmol/L (ref 98–111)
Creatinine, Ser: 0.91 mg/dL (ref 0.44–1.00)
GFR calc Af Amer: >60 mL/min (ref >60)
GFR calc non Af Amer: 59 mL/min — ABNORMAL LOW (ref >60)
Glucose, Bld: 148 mg/dL — ABNORMAL HIGH (ref 70–99)
Potassium: 3.4 mmol/L — ABNORMAL LOW (ref 3.5–5.1)
Sodium: 138 mmol/L (ref 135–145)
Total Bilirubin: 0.6 mg/dL (ref 0.3–1.2)
Total Protein: 6.1 g/dL — ABNORMAL LOW (ref 6.5–8.1)

## 2019-05-19 LAB — SAMPLE TO BLOOD BANK

## 2019-05-19 LAB — LACTATE DEHYDROGENASE: LDH: 944 U/L — ABNORMAL HIGH (ref 98–192)

## 2019-05-19 MED ORDER — ACETAMINOPHEN 325 MG PO TABS: 650.0000 mg | ORAL_TABLET | Freq: Once | ORAL | Status: DC

## 2019-05-19 MED ORDER — SODIUM CHLORIDE 0.9% IV SOLUTION
Freq: Once | INTRAVENOUS | Status: DC
  Filled 2019-05-19: qty 250

## 2019-05-19 MED ORDER — SODIUM CHLORIDE 0.9% IV SOLUTION
Freq: Once | INTRAVENOUS | Status: AC
  Administered 2019-05-19: 10:00:00 via INTRAVENOUS
  Filled 2019-05-19: qty 250

## 2019-05-19 MED ORDER — HEPARIN SOD (PORK) LOCK FLUSH 100 UNIT/ML IV SOLN
500.0000 [IU] | Freq: Every day | INTRAVENOUS | Status: AC | PRN
  Administered 2019-05-19: 250 [IU]

## 2019-05-19 MED ORDER — DIPHENHYDRAMINE HCL 25 MG PO CAPS: 25.0000 mg | ORAL_CAPSULE | Freq: Once | ORAL | Status: DC

## 2019-05-19 MED ORDER — DIPHENHYDRAMINE HCL 25 MG PO CAPS
25.0000 mg | ORAL_CAPSULE | Freq: Once | ORAL | Status: AC
  Administered 2019-05-19: 25 mg via ORAL
  Filled 2019-05-19: qty 1

## 2019-05-19 MED ORDER — SODIUM CHLORIDE 0.9% FLUSH
10.0000 mL | Freq: Once | INTRAVENOUS | Status: AC
  Administered 2019-05-19: 10 mL via INTRAVENOUS
  Filled 2019-05-19: qty 10

## 2019-05-19 MED ORDER — ACETAMINOPHEN 325 MG PO TABS
650.0000 mg | ORAL_TABLET | Freq: Once | ORAL | Status: AC
  Administered 2019-05-19: 650 mg via ORAL
  Filled 2019-05-19: qty 2

## 2019-05-19 NOTE — Progress Notes (Signed)
[place] OFFICE PROGRESS NOTE  Patient Care Team: Rusty Aus, MD as PCP - General (Internal Medicine)  CHIEF COMPLAINTS/PURPOSE OF CONSULTATION: Acute myeloid leukemia   Oncology History Overview Note  # SEP 2017- MYELOPROLIFERATIVE NEOPLASM- [WBC- 1; normal Hb/platelets] hypercellular bone marrow 90-95% proliferation of myeloid cells in various stages of maturation; relative erythroid hypoplasia and proliferation of atypical megakaryocytes; no increase in blasts; peripheral blood Bcr-Abl-NEG; Cytogenetics- WNL. NEG- Jak-2/MPL/CALR [place] limited- mild splenomegaly [~10cm; 463cm3]; OCT 2017- second opinion at [place]. Surveillance.   # With progressive leukocytosis we evaluated her a month ago and sent BM for exam. She has a progressive leukocytosis so hydrea 577m daily was started on 10/20/2016 and increased to 2gm daily then back down to one daily, then 5 days per week over time due to counts drop. Then we held her hydrea since 04/09/2018 due to continued declining Hb and Plt.s  BMB 06/24/18 showed markedly hypercellular BM 95% with myeloid hyperplasia and atypical megakaryocytic hyperplasia. No significant increase in blasts. 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 BCR / ABL negative, FISH normal. Flow Showed 2%CD34-positive myeloid blasts. Myeloid precursors with low side scatter. Pathogenic variants were detected in the ASXL1, CCND2, CUX1, and U2AF1 genes.  # March 2020- wbc- 14/ Hb 9.5/ platelets- 54.   ---------------------------------------------------   # June 8th 2020- Acute myeloid leukemia [peripheral blood flow cytometry;NGS- pending]- June 15th Vidaza [SQ 1-5 + Venatoclax- #1in pt]; tumor lysis prophylaxis  # Day #28 bone marrow biopsy-  7/14- BMx- NO BLASTS; positive for dysplastic changes. OFF Venatoclax [since 7/17] sec to cytopenias.  #817 cycle #2 Vidaza+ venetoclax 100 mg; stop venetoclax on  8/24 [severe pancytopenia]  # 02/18/2019-start gilteritinib 80 mg a day; HELD on 9/14 [sec to severe pan]  # NOV 9th/2020-BMBx- RELAPSED AML; NOV 11th- STARTED GILTERITINIB 461mday; NOV17th 2020- PROMACTA 503may; NOV 30th- inrease Gilt to 80 mg/day  # AUG 2020- AST elevation- 248-?sec to Posonoazole; RESOLVED; continue at lower dose 100 mg/day.   # 2002 [[place]] BREAST CA s/p Lumpect RT; [? Stage I] no chemo s/p AI  # F-ONE HEM: FLT3 ALTERATION NOTED  DIAGNOSIS: ACUTE MYELOID LEUKEMIA GOALS: pallaitive  CURRENT/MOST RECENT THERAPY : Gilteritinib     [place] (my[place]odysplastic syndrome) ([facility]  08/28/2018 Initial Diagnosis   [place]) ([facility] Acute myeloid leukemia not having achieved remission ([facility] [place] Initial Diagnosis   AML (acute myeloid leukemia) with failed remission ([facility] 11/25/2018 - 12/22/2018 Chemotherapy   The patient had azaCITIDine (VIDAZA) chemo injection 105 mg, 75 mg/m2 = 105 mg, Subcutaneous,  Once, 1 of 4 cycles Administration: 105 mg (11/25/2018), 105 mg (11/26/2018), 105 mg (11/27/2018), 105 mg (11/28/2018), 105 mg (11/29/2018)  for chemotherapy treatment.    01/27/2019 -  Chemotherapy   The patient had palonosetron (ALOXI) injection 0.25 mg, 0.25 mg, Intravenous,  Once, 1 of 4 cycles Administration: 0.25 mg (01/27/2019), 0.25 mg (01/29/2019), 0.25 mg (01/31/2019) azaCITIDine (VIDAZA) 100 mg in sodium chloride 0.9 % 50 mL chemo infusion, 75 mg/m2 = 100 mg, Intravenous, Once, 1 of 4 cycles Administration: 100 mg (01/27/2019), 100 mg (01/28/2019), 100 mg (01/29/2019), 100 mg (01/30/2019), 100 mg (01/31/2019)  for chemotherapy treatment.      HISTORY OF PRESENTING ILLNESS:  Kathryn Hahn [age].  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  Patient continues to complain of easy bruising.  Noted  to have blisters in the  mouth with acidic foods.  Otherwise no nosebleeds.  She has a fairly good appetite.  Gained couple of pounds.  [place].  No blood in stools; black or stools.  No fevers or chills.  Review of Systems  Constitutional: Positive for malaise/fatigue. Negative for chills, diaphoresis, fever and weight loss.  HENT: Negative for nosebleeds and sore throat.   Eyes: Negative for double vision.  Respiratory: Positive for shortness of breath. Negative for cough, hemoptysis, sputum production and wheezing.   Cardiovascular: Negative for chest pain, palpitations and orthopnea.  Gastrointestinal: Negative for abdominal pain, blood in stool, constipation, diarrhea, heartburn, melena, nausea and vomiting.  Genitourinary: Negative for dysuria, frequency and urgency.  Musculoskeletal: Negative for back pain and joint pain.  Skin: Negative.  Negative for itching and rash.  Neurological: Negative for dizziness, tingling, focal weakness, weakness and headaches.  Endo/Heme/Allergies: Bruises/bleeds easily.  Psychiatric/Behavioral: Negative for depression. The patient is not nervous/anxious and does not have insomnia.     MEDICAL HISTORY:  Past Medical History:  Diagnosis Date  . Anemia   . Arthritis   . Breast cancer ([place]) 2003   RT LUMPECTOMY  . Edema leg   . History of breast cancer 2003   post lumpectomy  . History of kidney stones   . Hyperlipemia, mixed   . Hypertension, essential   . Leukemia ([place])   . Leukocytosis   . MDS (myelodysplastic syndrome) ([place])   . Personal history of radiation therapy 2003   BREAST CA  . Renal stones   . Vitamin D deficiency     SURGICAL HISTORY: Past Surgical History:  Procedure Laterality Date  . BREAST BIOPSY Right 2003   Postive for cancer  . BREAST LUMPECTOMY Right 2003   BREAST CA  . KIDNEY STONE SURGERY Right     SOCIAL HISTORY: Social History   Socioeconomic History  . Marital status:  Married    Spouse name: Not on file  . Number of children: Not on file  . Years of education: Not on file  . Highest education level: Not on file  Occupational History  . Not on file  Social Needs  . Financial resource strain: Not on file  . Food insecurity    Worry: Not on file    Inability: Not on file  . Transportation needs    Medical: Not on file    Non-medical: Not on file  Tobacco Use  . Smoking status: Never Smoker  . Smokeless tobacco: Never Used  Substance and Sexual Activity  . Alcohol use: Yes    Alcohol/week: 3.0 standard drinks    Types: 3 Glasses of wine per week  . Drug use: No  . Sexual activity: Not on file  Lifestyle  . Physical activity    Days per week: Not on file    Minutes per session: Not on file  . Stress: Not on file  Relationships  . Social Herbalist on phone: Not on file    Gets together: Not on file    Attends religious service: Not on file    Active member of club or organization: Not on file    Attends meetings of clubs or organizations: Not on file    Relationship status: Not on file  . Intimate partner violence    Fear of current or ex partner: Not on file    Emotionally abused: Not on  file    Physically abused: Not on file    Forced sexual activity: Not on file  Other Topics Concern  . Not on file  Social History Narrative    No smoking/alcohol; lives in [place] with family.  She lives in assisted living with her husband.    FAMILY HISTORY: Family History  Problem Relation Age of Onset  . Stroke Mother   . Hypertension Father   . Stroke Father   . Breast cancer Neg Hx     ALLERGIES:  is allergic to terbinafine.  MEDICATIONS:  Current Outpatient Medications  Medication Sig Dispense Refill  . acetaminophen (TYLENOL) 325 MG tablet Take 325 mg by mouth every 6 (six) hours as needed for mild pain or moderate pain.     Marland Kitchen acyclovir (ZOVIRAX) 400 MG tablet ONE PILL A DAY [TO PREVENT SHINGLES] 90 tablet 1  .  atenolol (TENORMIN) 50 MG tablet Take 1 tablet by mouth 2 (two) times daily.    . calcium carbonate (OSCAL) 1500 (600 Ca) MG TABS tablet Take 600 mg of elemental calcium by mouth daily with breakfast.    . Cholecalciferol (VITAMIN D) 2000 units tablet Take 1 tablet by mouth daily.    Marland Kitchen eltrombopag (PROMACTA) 50 MG tablet Take 1 tablet (50 mg total) by mouth daily. Take on an empty stomach 1 hour before a meal or 2 hours after 30 tablet 3  . Gilteritinib Fumarate (XOSPATA) 40 MG TABS Take 80 mg by mouth daily. 60 tablet 2  . heparin lock flush 100 UNIT/ML SOLN injection Inject into the vein.    . Infant Care Products (DERMACLOUD) CREA Apply topically.    Marland Kitchen levofloxacin (LEVAQUIN) 250 MG tablet Take 1 tablet (250 mg total) by mouth daily. 30 tablet 3  . losartan-hydrochlorothiazide (HYZAAR) 50-12.5 MG tablet Take 1 tablet by mouth daily.    . magic mouthwash w/lidocaine SOLN Take 5 mLs by mouth 4 (four) times daily as needed for mouth pain. 480 mL 3  . magnesium hydroxide (MILK OF MAGNESIA) 400 MG/5ML suspension Take 5 mLs by mouth every 4 (four) hours as needed.    . ondansetron (ZOFRAN) 8 MG tablet Take by mouth every 8 (eight) hours as needed for nausea or vomiting.    . polyethylene glycol (MIRALAX / GLYCOLAX) 17 g packet Take 17 g by mouth daily.    . posaconazole (NOXAFIL) 100 MG TBEC delayed-release tablet Take 100 mg by mouth daily.    . potassium chloride SA (KLOR-CON M20) 20 MEQ tablet Take 1 tablet (20 mEq total) by mouth 2 (two) times daily. 30 tablet 3  . senna (SENOKOT) 8.6 MG tablet Take 2 tablets by mouth 2 (two) times daily.     No current facility-administered medications for this visit.    Facility-Administered Medications Ordered in Other Visits  Medication Dose Route Frequency Provider Last Rate Last Dose  . 0.9 %  sodium chloride infusion (Manually program via Guardrails IV Fluids)   Intravenous Once Charlaine Dalton R, MD      . 0.9 %  sodium chloride infusion    Intravenous Continuous Jacquelin Hawking, NP   Stopped at 02/05/19 1630    PHYSICAL EXAMINATION: ECOG PERFORMANCE STATUS: 1 - Symptomatic but completely ambulatory  Vitals:   05/19/19 0828  BP: (!) 144/78  Pulse: 67  Temp: (!) 96.8 F (36 C)   Filed Weights   05/19/19 0828  Weight: 96 lb (43.5 kg)    Physical Exam  Constitutional: She is oriented to  person, place, and time and well-developed, well-nourished, and in no distress.  PICC line in place.  No active bleeding.  Masked.  In a wheelchair.  HENT:  Head: Normocephalic and atraumatic.  Mouth/Throat: Oropharynx is clear and moist. No oropharyngeal exudate.  Eyes: Conjunctivae are normal. No scleral icterus.  Neck: Normal range of motion. Neck supple.  Cardiovascular: Normal rate and regular rhythm.  Pulmonary/Chest: Effort normal. No respiratory distress.  Abdominal: Soft. Bowel sounds are normal. She exhibits no distension and no mass. There is no abdominal tenderness. There is no rebound and no guarding.  Musculoskeletal: Normal range of motion.        General: No tenderness or edema.     Comments: Sitting in wheelchair  Neurological: She is alert and oriented to person, place, and time.  Skin: Skin is warm.  Multiple bruises on lower legs, few on hands and arms. Torso bruising improved/mostly resolved  Psychiatric: Affect normal.    LABORATORY DATA:  I have reviewed the data as listed Lab Results  Component Value Date   WBC 1.6 (L) 05/19/2019   HGB 8.3 (L) 05/19/2019   HCT 26.0 (L) 05/19/2019   MCV 88.7 05/19/2019   PLT 10 (LL) 05/19/2019   Recent Labs    04/14/19 0905  04/28/19 0837  05/07/19 0855 05/12/19 0822 05/19/19 0810  NA 137   < > 132*   < > 134* 136 138  K 3.5   < > 3.5   < > 3.4* 3.5 3.4*  CL 100   < > 99   < > 101 101 102  CO2 23   < > 25   < > _0 GLUCOSE 191*   < > 131*   < > 166* 188* 148*  BUN 23   < > 36*   < > 30* 33* 30*  CREATININE 0.77   < > 0.65   < > 0.76 0.76 0.91   CALCIUM 9.2   < > 9.0   < > 8.8* 9.1 9.1  GFRNONAA >60   < > >60   < > >60 >60 59*  GFRAA >60   < > >60   < > >60 >60 >60  PROT 6.5  --  6.0*  --   --   --  6.1*  ALBUMIN 3.8  --  3.6  --   --   --  3.5  AST 25  --  35  --   --   --  40  ALT 12  --  14  --   --   --  23  ALKPHOS 74  --  60  --   --   --  70  BILITOT 0.5  --  0.4  --   --   --  0.6   < > = values in this interval not displayed.    RADIOGRAPHIC STUDIES: I have personally reviewed the radiological images as listed and agreed with the findings in the report. Ct Bone Marrow Biopsy & Aspiration  Result Date: 04/21/2019 CLINICAL DATA:  Anemia in the setting of AML EXAM: CT GUIDED DEEP ILIAC BONE ASPIRATION AND CORE BIOPSY TECHNIQUE: Patient was placed prone on the CT gantry and limited axial scans through the pelvis were obtained. Appropriate skin entry site was identified. Skin site was marked, prepped with chlorhexidine, draped in usual sterile fashion, and infiltrated locally with 1% lidocaine. Intravenous Fentanyl 37mg and Versed 159mwere administered as conscious sedation during continuous monitoring of  the patient's level of consciousness and physiological / cardiorespiratory status by the radiology RN, with a total moderate sedation time of 11 minutes. Under CT fluoroscopic guidance an 11-gauge Cook trocar bone needle was advanced into the right iliac bone just lateral to the sacroiliac joint. Once needle tip position was confirmed, core and aspiration samples were obtained, submitted to pathology for approval. Patient tolerated procedure well. COMPLICATIONS: COMPLICATIONS none IMPRESSION: 1. Technically successful CT guided right iliac bone core and aspiration biopsy. Electronically Signed   By: Lucrezia Europe M.D.   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  NGS-FLT3 POSITIVE  # currently on gilteritinib-16m a day; Also Promacta.   #Leucopenia/ anemia/thrombocytopenia-White count 1.9 /hemoglobin 8.3 platelets10:  secondary to underlying acute myeloid leukemia/Gilteritinib.   Proceed with platelet transfusion today/PRBC as needed.  #Supportive care for AML-  -Continue Noxafil/posaconazole 1042mand acyclovir 400 mg, and levaquin 250 mg for prophylaxis. Continue PICC line dressing changes weekly  #Spoke to patient husband regarding plan of care.  In agreement.   Disposition: # Transfuse 1 unit of platelets today; NO PRBC.  # dec 10th- follow up as planned.  # Follow up- Dec 14th; MD- labs-cbc/bmp;LDH; hold tube; possible 1 unit platelets/1 unit PRBC transfusion- # 12/17- labs- hold tube'cbc; possible 1 unit platelets/1 unit PRBC transfusion-- Dr.B

## 2019-05-19 NOTE — Assessment & Plan Note (Addendum)
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  NGS-FLT3 POSITIVE  # currently on gilteritinib-28m a day; Also Promacta.   #Leucopenia/ anemia/thrombocytopenia-White count 1.9 /hemoglobin 8.3 platelets10:  secondary to underlying acute myeloid leukemia/Gilteritinib.   Proceed with platelet transfusion today/PRBC as needed.  #Supportive care for AML-  -Continue Noxafil/posaconazole 1056mand acyclovir 400 mg, and levaquin 250 mg for prophylaxis. Continue PICC line dressing changes weekly  #Spoke to patient husband regarding plan of care.  In agreement.   Disposition: # Transfuse 1 unit of platelets today; NO PRBC.  # dec 10th- follow up as planned.  # Follow up- Dec 14th; MD- labs-cbc/bmp;LDH; hold tube; possible 1 unit platelets/1 unit PRBC transfusion- # 12/17- labs- hold tube'cbc; possible 1 unit platelets/1 unit PRBC transfusion-- Dr.B

## 2019-05-20 ENCOUNTER — Telehealth: Payer: Self-pay | Admitting: *Deleted

## 2019-05-20 LAB — BPAM PLATELET PHERESIS
Blood Product Expiration Date: 202012092359
ISSUE DATE / TIME: 202012071113
Unit Type and Rh: 5100

## 2019-05-20 LAB — PREPARE PLATELET PHERESIS: Unit division: 0

## 2019-05-20 MED ORDER — POTASSIUM CHLORIDE CRYS ER 20 MEQ PO TBCR: 20.0000 meq | EXTENDED_RELEASE_TABLET | Freq: Two times a day (BID) | ORAL | 2 refills | Status: DC

## 2019-05-20 NOTE — Telephone Encounter (Signed)
Pharmacy requesting 30 or 90 day supply of potassium tabs,   ...  Ref Range & Units 1d ago 8d ago 13d ago  Potassium 3.5 - 5.1 mmol/L 3.4Low   3.5  3.4Low

## 2019-05-22 ENCOUNTER — Inpatient Hospital Stay: Payer: Medicare Other

## 2019-05-22 ENCOUNTER — Other Ambulatory Visit: Payer: Self-pay | Admitting: Licensed Clinical Social Worker

## 2019-05-22 ENCOUNTER — Other Ambulatory Visit: Payer: Self-pay

## 2019-05-22 ENCOUNTER — Inpatient Hospital Stay: Payer: Medicare Other | Admitting: *Deleted

## 2019-05-22 DIAGNOSIS — D696 Thrombocytopenia, unspecified: Secondary | ICD-10-CM

## 2019-05-22 DIAGNOSIS — C9200 Acute myeloblastic leukemia, not having achieved remission: Secondary | ICD-10-CM

## 2019-05-22 DIAGNOSIS — C9202 Acute myeloblastic leukemia, in relapse: Secondary | ICD-10-CM | POA: Diagnosis not present

## 2019-05-22 DIAGNOSIS — Z452 Encounter for adjustment and management of vascular access device: Secondary | ICD-10-CM

## 2019-05-22 LAB — CBC WITH DIFFERENTIAL/PLATELET
Abs Immature Granulocytes: 0.05 10*3/uL (ref 0.00–0.07)
Basophils Absolute: 0 10*3/uL (ref 0.0–0.1)
Basophils Relative: 0 %
Eosinophils Absolute: 0 10*3/uL (ref 0.0–0.5)
Eosinophils Relative: 0 %
HCT: 24 % — ABNORMAL LOW (ref 36.0–46.0)
Hemoglobin: 7.5 g/dL — ABNORMAL LOW (ref 12.0–15.0)
Immature Granulocytes: 4 %
Lymphocytes Relative: 32 %
Lymphs Abs: 0.4 10*3/uL — ABNORMAL LOW (ref 0.7–4.0)
MCH: 28 pg (ref 26.0–34.0)
MCHC: 31.3 g/dL (ref 30.0–36.0)
MCV: 89.6 fL (ref 80.0–100.0)
Monocytes Absolute: 0.6 10*3/uL (ref 0.1–1.0)
Monocytes Relative: 50 %
Neutro Abs: 0.2 10*3/uL — ABNORMAL LOW (ref 1.7–7.7)
Neutrophils Relative %: 14 %
Platelets: 11 10*3/uL — CL (ref 150–400)
RBC: 2.68 MIL/uL — ABNORMAL LOW (ref 3.87–5.11)
RDW: 18.5 % — ABNORMAL HIGH (ref 11.5–15.5)
WBC: 1.2 10*3/uL — CL (ref 4.0–10.5)
nRBC: 4.2 % — ABNORMAL HIGH (ref 0.0–0.2)

## 2019-05-22 LAB — PREPARE RBC (CROSSMATCH)

## 2019-05-22 LAB — SAMPLE TO BLOOD BANK

## 2019-05-22 MED ORDER — SODIUM CHLORIDE 0.9% IV SOLUTION
250.0000 mL | Freq: Once | INTRAVENOUS | Status: AC
  Administered 2019-05-22: 10:00:00 250 mL via INTRAVENOUS
  Filled 2019-05-22: qty 250

## 2019-05-22 MED ORDER — DIPHENHYDRAMINE HCL 25 MG PO CAPS
25.0000 mg | ORAL_CAPSULE | Freq: Once | ORAL | Status: AC
  Administered 2019-05-22: 10:00:00 25 mg via ORAL
  Filled 2019-05-22: qty 1

## 2019-05-22 MED ORDER — ACETAMINOPHEN 325 MG PO TABS
650.0000 mg | ORAL_TABLET | Freq: Once | ORAL | Status: AC
  Administered 2019-05-22: 650 mg via ORAL
  Filled 2019-05-22: qty 2

## 2019-05-22 MED ORDER — SODIUM CHLORIDE 0.9% FLUSH
10.0000 mL | Freq: Once | INTRAVENOUS | Status: AC
  Administered 2019-05-22: 10 mL via INTRAVENOUS
  Filled 2019-05-22: qty 10

## 2019-05-23 ENCOUNTER — Other Ambulatory Visit: Payer: Self-pay

## 2019-05-23 ENCOUNTER — Other Ambulatory Visit: Payer: Self-pay | Admitting: Licensed Clinical Social Worker

## 2019-05-23 LAB — BPAM RBC
Blood Product Expiration Date: 202012222359
Blood Product Expiration Date: 202012302359
ISSUE DATE / TIME: 202012101326
Unit Type and Rh: 5100
Unit Type and Rh: 5100

## 2019-05-23 LAB — TYPE AND SCREEN
ABO/RH(D): O POS
Antibody Screen: NEGATIVE
Unit division: 0
Unit division: 0

## 2019-05-26 ENCOUNTER — Inpatient Hospital Stay: Payer: Medicare Other | Admitting: *Deleted

## 2019-05-26 ENCOUNTER — Inpatient Hospital Stay: Payer: Medicare Other

## 2019-05-26 ENCOUNTER — Other Ambulatory Visit: Payer: Self-pay

## 2019-05-26 ENCOUNTER — Inpatient Hospital Stay (HOSPITAL_BASED_OUTPATIENT_CLINIC_OR_DEPARTMENT_OTHER): Payer: Medicare Other | Admitting: Internal Medicine

## 2019-05-26 ENCOUNTER — Other Ambulatory Visit: Payer: Self-pay | Admitting: Licensed Clinical Social Worker

## 2019-05-26 DIAGNOSIS — C9200 Acute myeloblastic leukemia, not having achieved remission: Secondary | ICD-10-CM

## 2019-05-26 DIAGNOSIS — D696 Thrombocytopenia, unspecified: Secondary | ICD-10-CM

## 2019-05-26 DIAGNOSIS — Z452 Encounter for adjustment and management of vascular access device: Secondary | ICD-10-CM

## 2019-05-26 DIAGNOSIS — C9202 Acute myeloblastic leukemia, in relapse: Secondary | ICD-10-CM | POA: Diagnosis not present

## 2019-05-26 LAB — CBC WITH DIFFERENTIAL/PLATELET
Abs Immature Granulocytes: 0.1 10*3/uL — ABNORMAL HIGH (ref 0.00–0.07)
Basophils Absolute: 0 10*3/uL (ref 0.0–0.1)
Basophils Relative: 0 %
Blasts: 15 %
Eosinophils Absolute: 0 10*3/uL (ref 0.0–0.5)
Eosinophils Relative: 0 %
HCT: 25.4 % — ABNORMAL LOW (ref 36.0–46.0)
Hemoglobin: 8 g/dL — ABNORMAL LOW (ref 12.0–15.0)
Lymphocytes Relative: 39 %
Lymphs Abs: 0.5 10*3/uL — ABNORMAL LOW (ref 0.7–4.0)
MCH: 28 pg (ref 26.0–34.0)
MCHC: 31.5 g/dL (ref 30.0–36.0)
MCV: 88.8 fL (ref 80.0–100.0)
Metamyelocytes Relative: 3 %
Monocytes Absolute: 0.4 10*3/uL (ref 0.1–1.0)
Monocytes Relative: 28 %
Myelocytes: 4 %
Neutro Abs: 0.2 10*3/uL — ABNORMAL LOW (ref 1.7–7.7)
Neutrophils Relative %: 11 %
Platelets: 7 10*3/uL — CL (ref 150–400)
RBC: 2.86 MIL/uL — ABNORMAL LOW (ref 3.87–5.11)
RDW: 17.8 % — ABNORMAL HIGH (ref 11.5–15.5)
Smear Review: DECREASED
WBC: 1.4 10*3/uL — CL (ref 4.0–10.5)
nRBC: 6.3 % — ABNORMAL HIGH (ref 0.0–0.2)

## 2019-05-26 LAB — BPAM PLATELET PHERESIS
Blood Product Expiration Date: 202012102359
ISSUE DATE / TIME: 202012101237
Unit Type and Rh: 6200

## 2019-05-26 LAB — PREPARE PLATELET PHERESIS: Unit division: 0

## 2019-05-26 LAB — COMPREHENSIVE METABOLIC PANEL
ALT: 26 U/L (ref 0–44)
AST: 44 U/L — ABNORMAL HIGH (ref 15–41)
Albumin: 3.6 g/dL (ref 3.5–5.0)
Alkaline Phosphatase: 66 U/L (ref 38–126)
Anion gap: 11 (ref 5–15)
BUN: 38 mg/dL — ABNORMAL HIGH (ref 8–23)
CO2: 25 mmol/L (ref 22–32)
Calcium: 9 mg/dL (ref 8.9–10.3)
Chloride: 101 mmol/L (ref 98–111)
Creatinine, Ser: 0.96 mg/dL (ref 0.44–1.00)
GFR calc Af Amer: >60 mL/min (ref >60)
GFR calc non Af Amer: 55 mL/min — ABNORMAL LOW (ref >60)
Glucose, Bld: 141 mg/dL — ABNORMAL HIGH (ref 70–99)
Potassium: 3.9 mmol/L (ref 3.5–5.1)
Sodium: 137 mmol/L (ref 135–145)
Total Bilirubin: 0.5 mg/dL (ref 0.3–1.2)
Total Protein: 6.1 g/dL — ABNORMAL LOW (ref 6.5–8.1)

## 2019-05-26 LAB — LACTATE DEHYDROGENASE: LDH: 1109 U/L — ABNORMAL HIGH (ref 98–192)

## 2019-05-26 LAB — SAMPLE TO BLOOD BANK

## 2019-05-26 MED ORDER — HEPARIN SOD (PORK) LOCK FLUSH 100 UNIT/ML IV SOLN
500.0000 [IU] | Freq: Once | INTRAVENOUS | Status: AC
  Administered 2019-05-26: 500 [IU] via INTRAVENOUS

## 2019-05-26 MED ORDER — DIPHENHYDRAMINE HCL 25 MG PO CAPS
25.0000 mg | ORAL_CAPSULE | Freq: Once | ORAL | Status: AC
  Administered 2019-05-26: 25 mg via ORAL
  Filled 2019-05-26: qty 1

## 2019-05-26 MED ORDER — SODIUM CHLORIDE 0.9% FLUSH
10.0000 mL | Freq: Once | INTRAVENOUS | Status: AC
  Administered 2019-05-26: 10 mL via INTRAVENOUS
  Filled 2019-05-26: qty 10

## 2019-05-26 MED ORDER — SODIUM CHLORIDE 0.9% IV SOLUTION
Freq: Once | INTRAVENOUS | Status: AC
  Administered 2019-05-26: 10:00:00 via INTRAVENOUS
  Filled 2019-05-26: qty 250

## 2019-05-26 MED ORDER — ACETAMINOPHEN 325 MG PO TABS
650.0000 mg | ORAL_TABLET | Freq: Once | ORAL | Status: AC
  Administered 2019-05-26: 650 mg via ORAL
  Filled 2019-05-26: qty 2

## 2019-05-26 NOTE — Assessment & Plan Note (Addendum)
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  NGS-FLT3 POSITIVE  # Currently on gilteritinib-'80mg'$  a day; Also Promacta.  No significant response noted at this time.  Patient still needing 2 platelets a week/1 PRBC weekly discussed with the patient/husband-if no response noted; next option would be vidaza/ venatoclax.  However I would closely monitor for response at this time.  # Leucopenia/ anemia/thrombocytopenia-White count 1.4 /hemoglobin 8.0 platelets-7:  secondary to underlying acute myeloid leukemia/Gilteritinib.   Proceed with platelet transfusion today/PRBC as needed.  # Left back pain chronic-do not recommend Motrin Aleve or Advil.  Tyelenol prn.   #Supportive care for AML-  -Continue Noxafil/posaconazole '100mg'$  and acyclovir 400 mg, and levaquin 250 mg for prophylaxis. Continue PICC line dressing changes weekly  #Spoke to patient husband regarding plan of care.  In agreement.   Disposition: # Transfuse 1 unit of platelets today; NO PRBC.  # dec 17th as planned.  # Follow up- Dec 21st h; MD- labs-cbc/bmp;LDH; hold tube; possible 1 unit platelets/1 unit PRBC transfusion- # 12/24- labs- hold tube'cbc; possible 1 unit platelets/1 unit PRBC transfusion-- Dr.B

## 2019-05-26 NOTE — Progress Notes (Signed)
Kathryn Hahn OFFICE PROGRESS NOTE  Patient Care Team: Rusty Aus, MD as PCP - General (Internal Medicine)  CHIEF COMPLAINTS/PURPOSE OF CONSULTATION: Acute myeloid leukemia   Oncology History Overview Note  # SEP 2017- MYELOPROLIFERATIVE NEOPLASM- [WBC- 28; normal Hb/platelets] hypercellular bone marrow 90-95% proliferation of myeloid cells in various stages of maturation; relative erythroid hypoplasia and proliferation of atypical megakaryocytes; no increase in blasts; peripheral blood Bcr-Abl-NEG; Cytogenetics- WNL. NEG- Jak-2/MPL/CALR [place] limited- mild splenomegaly [~10cm; 463cm3]; OCT 2017- second opinion at [facility]. Surveillance.   # With progressive leukocytosis we evaluated her a month ago and sent BM for exam. She has a progressive leukocytosis so hydrea '500mg'$  daily was started on 10/20/2016 and increased to 2gm daily then back down to one daily, then 5 days per week over time due to counts drop. Then we held her hydrea since 04/09/2018 due to continued declining Hb and Plt.s  BMB 06/24/18 showed markedly hypercellular BM 95% with myeloid hyperplasia and atypical megakaryocytic hyperplasia. No significant increase in blasts. 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 BCR / ABL negative, FISH normal. Flow Showed 2%CD34-positive myeloid blasts. Myeloid precursors with low side scatter. Pathogenic variants were detected in the ASXL1, CCND2, CUX1, and U2AF1 genes.  # March 2020- wbc- 14/ Hb 9.5/ platelets- 54.   ---------------------------------------------------   # June 8th 2020- Acute myeloid leukemia [peripheral blood flow cytometry;NGS- pending]- June 15th Vidaza [SQ 1-5 + Venatoclax- #1in pt]; tumor lysis prophylaxis  # Day #28 bone marrow biopsy-  7/14- BMx- NO BLASTS; positive for dysplastic changes. OFF Venatoclax [since 7/17] sec to cytopenias.  #817 cycle #2 Vidaza+ venetoclax 100 mg; stop venetoclax on  8/24 [severe pancytopenia]  # 02/18/2019-start gilteritinib 80 mg a day; HELD on 9/14 [sec to severe pan]  # NOV 9th/2020-BMBx- RELAPSED AML; NOV 11th- STARTED GILTERITINIB '40mg'$ /day; NOV17th 2020- PROMACTA '50mg'$ /day; NOV 30th- inrease Gilt to 80 mg/day  # AUG 2020- AST elevation- 248-?sec to Posonoazole; RESOLVED; continue at lower dose 100 mg/day.   # 2002 [[place]] BREAST CA s/p Lumpect RT; [? Stage I] no chemo s/p AI  # F-ONE HEM: FLT3 ALTERATION NOTED  DIAGNOSIS: ACUTE MYELOID LEUKEMIA GOALS: pallaitive  CURRENT/MOST RECENT THERAPY : Gilteritinib     MDS (myelodysplastic syndrome) ([place]) (Resolved)  08/28/2018 Initial Diagnosis   MDS (myelodysplastic syndrome) ([place])   Acute myeloid leukemia not having achieved remission ([place])  11/25/2018 Initial Diagnosis   AML (acute myeloid leukemia) with failed remission ([place])   11/25/2018 - 12/22/2018 Chemotherapy   The patient had azaCITIDine (VIDAZA) chemo injection 105 mg, 75 mg/m2 = 105 mg, Subcutaneous,  Once, 1 of 4 cycles Administration: 105 mg (11/25/2018), 105 mg (11/26/2018), 105 mg (11/27/2018), 105 mg (11/28/2018), 105 mg (11/29/2018)  for chemotherapy treatment.    01/27/2019 -  Chemotherapy   The patient had palonosetron (ALOXI) injection 0.25 mg, 0.25 mg, Intravenous,  Once, 1 of 4 cycles Administration: 0.25 mg (01/27/2019), 0.25 mg (01/29/2019), 0.25 mg (01/31/2019) azaCITIDine (VIDAZA) 100 mg in sodium chloride 0.9 % 50 mL chemo infusion, 75 mg/m2 = 100 mg, Intravenous, Once, 1 of 4 cycles Administration: 100 mg (01/27/2019), 100 mg (01/28/2019), 100 mg (01/29/2019), 100 mg (01/30/2019), 100 mg (01/31/2019)  for chemotherapy treatment.      HISTORY OF PRESENTING ILLNESS:  Kathryn Hahn 85 y.o.  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  Patient celebrated her birthday with her family/virtually last  week.  Continues to have easy bruising.  No other blisters tomorrow.  Appetite is good.  No significant weight loss.  She continues to be ambulatory/takes walks.   No blood in stools or black or stools.  Review of Systems  Constitutional: Positive for malaise/fatigue. Negative for chills, diaphoresis, fever and weight loss.  HENT: Negative for nosebleeds and sore throat.   Eyes: Negative for double vision.  Respiratory: Negative for cough, hemoptysis, sputum production and wheezing.   Cardiovascular: Negative for chest pain, palpitations and orthopnea.  Gastrointestinal: Negative for abdominal pain, blood in stool, constipation, diarrhea, heartburn, melena, nausea and vomiting.  Genitourinary: Negative for dysuria, frequency and urgency.  Musculoskeletal: Positive for back pain. Negative for joint pain.  Skin: Negative.  Negative for itching and rash.  Neurological: Negative for dizziness, tingling, focal weakness, weakness and headaches.  Endo/Heme/Allergies: Bruises/bleeds easily.  Psychiatric/Behavioral: Negative for depression. The patient is not nervous/anxious and does not have insomnia.     MEDICAL HISTORY:  Past Medical History:  Diagnosis Date  . Anemia   . Arthritis   . Breast cancer ([place]) 2003   RT LUMPECTOMY  . Edema leg   . History of breast cancer 2003   post lumpectomy  . History of kidney stones   . Hyperlipemia, mixed   . Hypertension, essential   . Leukemia ([place])   . Leukocytosis   . MDS (myelodysplastic syndrome) ([place])   . Personal history of radiation therapy 2003   BREAST CA  . Renal stones   . Vitamin D deficiency     SURGICAL HISTORY: Past Surgical History:  Procedure Laterality Date  . BREAST BIOPSY Right 2003   Postive for cancer  . BREAST LUMPECTOMY Right 2003   BREAST CA  . KIDNEY STONE SURGERY Right     SOCIAL HISTORY: Social History   Socioeconomic History  . Marital status: Married    Spouse name: Not on file  . Number of  children: Not on file  . Years of education: Not on file  . Highest education level: Not on file  Occupational History  . Not on file  Tobacco Use  . Smoking status: Never Smoker  . Smokeless tobacco: Never Used  Substance and Sexual Activity  . Alcohol use: Yes    Alcohol/week: 3.0 standard drinks    Types: 3 Glasses of wine per week  . Drug use: No  . Sexual activity: Not on file  Other Topics Concern  . Not on file  Social History Narrative    No smoking/alcohol; lives in [place] with family.  She lives in assisted living with her husband.   Social Determinants of Health   Financial Resource Strain:   . Difficulty of Paying Living Expenses: Not on file  Food Insecurity:   . Worried About Charity fundraiser in the Last Year: Not on file  . Ran Out of Food in the Last Year: Not on file  Transportation Needs:   . Lack of Transportation (Medical): Not on file  . Lack of Transportation (Non-Medical): Not on file  Physical Activity:   . Days of Exercise per Week: Not on file  . Minutes of Exercise per Session: Not on file  Stress:   . Feeling of Stress : Not on file  Social Connections:   . Frequency of Communication with Friends and Family: Not on file  . Frequency of Social Gatherings with Friends and Family: Not on file  . Attends Religious Services: Not on file  .  Active Member of Clubs or Organizations: Not on file  . Attends Archivist Meetings: Not on file  . Marital Status: Not on file  Intimate Partner Violence:   . Fear of Current or Ex-Partner: Not on file  . Emotionally Abused: Not on file  . Physically Abused: Not on file  . Sexually Abused: Not on file    FAMILY HISTORY: Family History  Problem Relation Age of Onset  . Stroke Mother   . Hypertension Father   . Stroke Father   . Breast cancer Neg Hx     ALLERGIES:  is allergic to terbinafine.  MEDICATIONS:  Current Outpatient Medications  Medication Sig Dispense Refill  .  acetaminophen (TYLENOL) 325 MG tablet Take 325 mg by mouth every 6 (six) hours as needed for mild pain or moderate pain.     Marland Kitchen acyclovir (ZOVIRAX) 400 MG tablet ONE PILL A DAY [TO PREVENT SHINGLES] 90 tablet 1  . atenolol (TENORMIN) 50 MG tablet Take 1 tablet by mouth 2 (two) times daily.    . calcium carbonate (OSCAL) 1500 (600 Ca) MG TABS tablet Take 600 mg of elemental calcium by mouth daily with breakfast.    . Cholecalciferol (VITAMIN D) 2000 units tablet Take 1 tablet by mouth daily.    Marland Kitchen eltrombopag (PROMACTA) 50 MG tablet Take 1 tablet (50 mg total) by mouth daily. Take on an empty stomach 1 hour before a meal or 2 hours after 30 tablet 3  . Gilteritinib Fumarate (XOSPATA) 40 MG TABS Take 80 mg by mouth daily. 60 tablet 2  . heparin lock flush 100 UNIT/ML SOLN injection Inject into the vein.    . Infant Care Products (DERMACLOUD) CREA Apply topically.    Marland Kitchen levofloxacin (LEVAQUIN) 250 MG tablet Take 1 tablet (250 mg total) by mouth daily. 30 tablet 3  . losartan-hydrochlorothiazide (HYZAAR) 50-12.5 MG tablet Take 1 tablet by mouth daily.    . magic mouthwash w/lidocaine SOLN Take 5 mLs by mouth 4 (four) times daily as needed for mouth pain. 480 mL 3  . magnesium hydroxide (MILK OF MAGNESIA) 400 MG/5ML suspension Take 5 mLs by mouth every 4 (four) hours as needed.    . ondansetron (ZOFRAN) 8 MG tablet Take by mouth every 8 (eight) hours as needed for nausea or vomiting.    . polyethylene glycol (MIRALAX / GLYCOLAX) 17 g packet Take 17 g by mouth daily.    . posaconazole (NOXAFIL) 100 MG TBEC delayed-release tablet Take 100 mg by mouth daily.    . potassium chloride SA (KLOR-CON M20) 20 MEQ tablet Take 1 tablet (20 mEq total) by mouth 2 (two) times daily. 60 tablet 2  . senna (SENOKOT) 8.6 MG tablet Take 2 tablets by mouth 2 (two) times daily.     No current facility-administered medications for this visit.   Facility-Administered Medications Ordered in Other Visits  Medication Dose Route  Frequency Provider Last Rate Last Admin  . 0.9 %  sodium chloride infusion   Intravenous Continuous Jacquelin Hawking, NP   Stopped at 02/05/19 1630    PHYSICAL EXAMINATION: ECOG PERFORMANCE STATUS: 1 - Symptomatic but completely ambulatory  Vitals:   05/26/19 0902  BP: (!) 162/80  Pulse: 68  Temp: (!) 96.7 F (35.9 C)   Filed Weights   05/26/19 0902  Weight: 97 lb 12.8 oz (44.4 kg)    Physical Exam  Constitutional: She is oriented to person, place, and time and well-developed, well-nourished, and in no distress.  PICC  line in place.  No active bleeding.  Masked.  In a wheelchair.  Accompanied by husband.  HENT:  Head: Normocephalic and atraumatic.  Mouth/Throat: Oropharynx is clear and moist. No oropharyngeal exudate.  Eyes: Conjunctivae are normal. No scleral icterus.  Cardiovascular: Normal rate and regular rhythm.  Pulmonary/Chest: Effort normal. No respiratory distress.  Abdominal: Soft. Bowel sounds are normal. She exhibits no distension and no mass. There is no abdominal tenderness. There is no rebound and no guarding.  Musculoskeletal:        General: No tenderness or edema. Normal range of motion.     Cervical back: Normal range of motion and neck supple.     Comments: Sitting in wheelchair  Neurological: She is alert and oriented to person, place, and time.  Skin: Skin is warm.  Multiple bruises on lower legs, few on hands and arms. Torso bruising improved/mostly resolved  Psychiatric: Affect normal.    LABORATORY DATA:  I have reviewed the data as listed Lab Results  Component Value Date   WBC 1.4 (LL) 05/26/2019   HGB 8.0 (L) 05/26/2019   HCT 25.4 (L) 05/26/2019   MCV 88.8 05/26/2019   PLT PENDING 05/26/2019   Recent Labs    04/28/19 0837 05/12/19 0822 05/19/19 0810 05/26/19 0845  NA 132* 136 138 137  K 3.5 3.5 3.4* 3.9  CL 99 101 102 101  CO2 '25 25 26 25  '$ GLUCOSE 131* 188* 148* 141*  BUN 36* 33* 30* 38*  CREATININE 0.65 0.76 0.91 0.96   CALCIUM 9.0 9.1 9.1 9.0  GFRNONAA >60 >60 59* 55*  GFRAA >60 >60 >60 >60  PROT 6.0*  --  6.1* 6.1*  ALBUMIN 3.6  --  3.5 3.6  AST 35  --  40 44*  ALT 14  --  23 26  ALKPHOS 60  --  70 66  BILITOT 0.4  --  0.6 0.5    RADIOGRAPHIC STUDIES: I have personally reviewed the radiological images as listed and agreed with the findings in the report. No results found.  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  NGS-FLT3 POSITIVE  # Currently on gilteritinib-'80mg'$  a day; Also Promacta.  No significant response noted at this time.  Patient still needing 2 platelets a week/1 PRBC weekly discussed with the patient/husband-if no response noted; next option would be vidaza/ venatoclax.  However I would closely monitor for response at this time.  # Leucopenia/ anemia/thrombocytopenia-White count 1.4 /hemoglobin 8.0 platelets-7:  secondary to underlying acute myeloid leukemia/Gilteritinib.   Proceed with platelet transfusion today/PRBC as needed.  # Left back pain chronic-do not recommend Motrin Aleve or Advil.  Tyelenol prn.   #Supportive care for AML-  -Continue Noxafil/posaconazole '100mg'$  and acyclovir 400 mg, and levaquin 250 mg for prophylaxis. Continue PICC line dressing changes weekly  #Spoke to patient husband regarding plan of care.  In agreement.   Disposition: # Transfuse 1 unit of platelets today; NO PRBC.  # dec 17th as planned.  # Follow up- Dec 21st h; MD- labs-cbc/bmp;LDH; hold tube; possible 1 unit platelets/1 unit PRBC transfusion- # 12/24- labs- hold tube'cbc; possible 1 unit platelets/1 unit PRBC transfusion-- Dr.B

## 2019-05-27 LAB — PREPARE PLATELET PHERESIS: Unit division: 0

## 2019-05-27 LAB — BPAM PLATELET PHERESIS
Blood Product Expiration Date: 202012151011
ISSUE DATE / TIME: 202012141059
Unit Type and Rh: 6200

## 2019-05-28 ENCOUNTER — Telehealth: Payer: Self-pay | Admitting: Pharmacy Technician

## 2019-05-28 NOTE — Telephone Encounter (Signed)
Oral Oncology Patient Advocate Encounter  Met patient in lobby to complete application for [employer] Patient Assistance Foundation in an effort to reduce patient's out of pocket expense for Promacta to $0.    Application completed and faxed to [phone].   [employer] Patient [place] phone number for follow up is [phone].   This encounter will be updated until final determination.   [facility] Patient [place] Phone [phone] Fax [phone] 05/28/2019 9:38 AM

## 2019-05-28 NOTE — Telephone Encounter (Signed)
Oral Oncology Patient Advocate Encounter  Met patient in lobby to complete application for [employer] PAP Renewal in an effort to reduce patient's out of pocket expense for Noxafil to $0.    Application mailed on 58/12/2759 to:   [place]  [address]  Fax number for [employer] is not in service.  [employer] patient assistance phone number for follow up is [phone].   This encounter will be updated until final determination.   Kathryn Hahn Phone [phone] Fax [phone] 05/28/2019 9:36 AM

## 2019-05-29 ENCOUNTER — Other Ambulatory Visit: Payer: Self-pay

## 2019-05-29 ENCOUNTER — Inpatient Hospital Stay: Payer: Medicare Other | Admitting: *Deleted

## 2019-05-29 ENCOUNTER — Other Ambulatory Visit: Payer: Self-pay | Admitting: *Deleted

## 2019-05-29 ENCOUNTER — Inpatient Hospital Stay: Payer: Medicare Other

## 2019-05-29 DIAGNOSIS — C9202 Acute myeloblastic leukemia, in relapse: Secondary | ICD-10-CM | POA: Diagnosis not present

## 2019-05-29 DIAGNOSIS — Z452 Encounter for adjustment and management of vascular access device: Secondary | ICD-10-CM

## 2019-05-29 DIAGNOSIS — C9200 Acute myeloblastic leukemia, not having achieved remission: Secondary | ICD-10-CM

## 2019-05-29 LAB — CBC WITH DIFFERENTIAL/PLATELET
Abs Immature Granulocytes: 0.1 10*3/uL — ABNORMAL HIGH (ref 0.00–0.07)
Basophils Absolute: 0 10*3/uL (ref 0.0–0.1)
Basophils Relative: 0 %
Blasts: 31 %
Eosinophils Absolute: 0 10*3/uL (ref 0.0–0.5)
Eosinophils Relative: 0 %
HCT: 25.5 % — ABNORMAL LOW (ref 36.0–46.0)
Hemoglobin: 8 g/dL — ABNORMAL LOW (ref 12.0–15.0)
Lymphocytes Relative: 33 %
Lymphs Abs: 0.6 10*3/uL — ABNORMAL LOW (ref 0.7–4.0)
MCH: 27.9 pg (ref 26.0–34.0)
MCHC: 31.4 g/dL (ref 30.0–36.0)
MCV: 88.9 fL (ref 80.0–100.0)
Metamyelocytes Relative: 2 %
Monocytes Absolute: 0.4 10*3/uL (ref 0.1–1.0)
Monocytes Relative: 23 %
Myelocytes: 2 %
Neutro Abs: 0.2 10*3/uL — ABNORMAL LOW (ref 1.7–7.7)
Neutrophils Relative %: 9 %
Platelets: 13 10*3/uL — CL (ref 150–400)
RBC: 2.87 MIL/uL — ABNORMAL LOW (ref 3.87–5.11)
RDW: 17.9 % — ABNORMAL HIGH (ref 11.5–15.5)
Smear Review: DECREASED
WBC: 1.8 10*3/uL — ABNORMAL LOW (ref 4.0–10.5)
nRBC: 6 % — ABNORMAL HIGH (ref 0.0–0.2)

## 2019-05-29 LAB — SAMPLE TO BLOOD BANK

## 2019-05-29 MED ORDER — SODIUM CHLORIDE 0.9% FLUSH
10.0000 mL | Freq: Once | INTRAVENOUS | Status: AC
  Administered 2019-05-29: 09:00:00 10 mL via INTRAVENOUS
  Filled 2019-05-29: qty 10

## 2019-05-29 MED ORDER — SODIUM CHLORIDE 0.9% IV SOLUTION
250.0000 mL | Freq: Once | INTRAVENOUS | Status: AC
  Administered 2019-05-29: 10:00:00 250 mL via INTRAVENOUS
  Filled 2019-05-29: qty 250

## 2019-05-29 MED ORDER — HEPARIN SOD (PORK) LOCK FLUSH 100 UNIT/ML IV SOLN
INTRAVENOUS | Status: AC
  Filled 2019-05-29: qty 5

## 2019-05-29 MED ORDER — HEPARIN SOD (PORK) LOCK FLUSH 100 UNIT/ML IV SOLN
500.0000 [IU] | Freq: Once | INTRAVENOUS | Status: AC
  Administered 2019-05-29: 500 [IU] via INTRAVENOUS
  Filled 2019-05-29: qty 5

## 2019-05-29 MED ORDER — DIPHENHYDRAMINE HCL 25 MG PO CAPS
25.0000 mg | ORAL_CAPSULE | Freq: Once | ORAL | Status: AC
  Administered 2019-05-29: 25 mg via ORAL
  Filled 2019-05-29: qty 1

## 2019-05-29 MED ORDER — ACETAMINOPHEN 325 MG PO TABS
650.0000 mg | ORAL_TABLET | Freq: Once | ORAL | Status: AC
  Administered 2019-05-29: 650 mg via ORAL
  Filled 2019-05-29: qty 2

## 2019-05-30 ENCOUNTER — Other Ambulatory Visit: Payer: Self-pay

## 2019-05-30 LAB — BPAM PLATELET PHERESIS
Blood Product Expiration Date: 202012192359
ISSUE DATE / TIME: 202012171046
Unit Type and Rh: 5100

## 2019-05-30 LAB — PREPARE PLATELET PHERESIS: Unit division: 0

## 2019-05-30 NOTE — Progress Notes (Signed)
Patient contacted for prescreening call prior to apts on Monday. No changes in pt's medical condition. However, pt and her husband would like to discuss with md the pros/cons of discontinuing the promacta.  Also pt/pt's husband aware that if blood is needed on Monday, she will need to go to [facility] for the infusion due to chair availability in the cancer center. Husband gave verbal understanding of the plan of care.

## 2019-06-02 ENCOUNTER — Other Ambulatory Visit: Payer: Self-pay

## 2019-06-02 ENCOUNTER — Inpatient Hospital Stay: Payer: Medicare Other | Admitting: *Deleted

## 2019-06-02 ENCOUNTER — Ambulatory Visit
Admission: RE | Admit: 2019-06-02 | Discharge: 2019-06-02 | Disposition: A | Discharge status: Home / Self Care | Payer: Medicare Other | Source: Ambulatory Visit | Attending: Internal Medicine | Admitting: Internal Medicine

## 2019-06-02 ENCOUNTER — Inpatient Hospital Stay (HOSPITAL_BASED_OUTPATIENT_CLINIC_OR_DEPARTMENT_OTHER): Payer: Medicare Other | Admitting: Internal Medicine

## 2019-06-02 ENCOUNTER — Other Ambulatory Visit: Payer: Self-pay | Admitting: *Deleted

## 2019-06-02 DIAGNOSIS — C9202 Acute myeloblastic leukemia, in relapse: Secondary | ICD-10-CM | POA: Diagnosis not present

## 2019-06-02 DIAGNOSIS — C9200 Acute myeloblastic leukemia, not having achieved remission: Secondary | ICD-10-CM

## 2019-06-02 DIAGNOSIS — D649 Anemia, unspecified: Secondary | ICD-10-CM | POA: Insufficient documentation

## 2019-06-02 DIAGNOSIS — Z452 Encounter for adjustment and management of vascular access device: Secondary | ICD-10-CM

## 2019-06-02 LAB — LACTATE DEHYDROGENASE: LDH: 1471 U/L — ABNORMAL HIGH (ref 98–192)

## 2019-06-02 LAB — CBC WITH DIFFERENTIAL/PLATELET
Abs Immature Granulocytes: 0 K/uL (ref 0.00–0.07)
Basophils Absolute: 0 K/uL (ref 0.0–0.1)
Basophils Relative: 0 %
Blasts: 40 %
Eosinophils Absolute: 0 K/uL (ref 0.0–0.5)
Eosinophils Relative: 0 %
HCT: 23.3 % — ABNORMAL LOW (ref 36.0–46.0)
Hemoglobin: 7.3 g/dL — ABNORMAL LOW (ref 12.0–15.0)
Lymphocytes Relative: 28 %
Lymphs Abs: 0.6 K/uL — ABNORMAL LOW (ref 0.7–4.0)
MCH: 27.4 pg (ref 26.0–34.0)
MCHC: 31.3 g/dL (ref 30.0–36.0)
MCV: 87.6 fL (ref 80.0–100.0)
Monocytes Absolute: 0.4 K/uL (ref 0.1–1.0)
Monocytes Relative: 20 %
Neutro Abs: 0.3 K/uL — ABNORMAL LOW (ref 1.7–7.7)
Neutrophils Relative %: 12 %
Platelets: 8 K/uL — CL (ref 150–400)
RBC: 2.66 MIL/uL — ABNORMAL LOW (ref 3.87–5.11)
RDW: 18 % — ABNORMAL HIGH (ref 11.5–15.5)
WBC: 2.2 K/uL — ABNORMAL LOW (ref 4.0–10.5)
nRBC: 8.4 % — ABNORMAL HIGH (ref 0.0–0.2)

## 2019-06-02 LAB — COMPREHENSIVE METABOLIC PANEL WITH GFR
ALT: 30 U/L (ref 0–44)
AST: 58 U/L — ABNORMAL HIGH (ref 15–41)
Albumin: 3.6 g/dL (ref 3.5–5.0)
Alkaline Phosphatase: 67 U/L (ref 38–126)
Anion gap: 9 (ref 5–15)
BUN: 33 mg/dL — ABNORMAL HIGH (ref 8–23)
CO2: 25 mmol/L (ref 22–32)
Calcium: 9 mg/dL (ref 8.9–10.3)
Chloride: 99 mmol/L (ref 98–111)
Creatinine, Ser: 0.95 mg/dL (ref 0.44–1.00)
GFR calc Af Amer: >60 mL/min (ref >60)
GFR calc non Af Amer: 55 mL/min — ABNORMAL LOW (ref >60)
Glucose, Bld: 181 mg/dL — ABNORMAL HIGH (ref 70–99)
Potassium: 3.6 mmol/L (ref 3.5–5.1)
Sodium: 133 mmol/L — ABNORMAL LOW (ref 135–145)
Total Bilirubin: 0.5 mg/dL (ref 0.3–1.2)
Total Protein: 6.2 g/dL — ABNORMAL LOW (ref 6.5–8.1)

## 2019-06-02 LAB — SAMPLE TO BLOOD BANK

## 2019-06-02 LAB — PREPARE RBC (CROSSMATCH)

## 2019-06-02 MED ORDER — SODIUM CHLORIDE 0.9% FLUSH
10.0000 mL | Freq: Once | INTRAVENOUS | Status: AC
  Administered 2019-06-02: 10 mL via INTRAVENOUS
  Filled 2019-06-02: qty 10

## 2019-06-02 MED ORDER — HEPARIN SOD (PORK) LOCK FLUSH 100 UNIT/ML IV SOLN: 250.0000 [IU] | INTRAVENOUS | Status: AC | PRN

## 2019-06-02 MED ORDER — DIPHENHYDRAMINE HCL 25 MG PO CAPS
ORAL_CAPSULE | ORAL | Status: AC
  Administered 2019-06-02: 10:00:00 25 mg via ORAL
  Filled 2019-06-02: qty 1

## 2019-06-02 MED ORDER — DIPHENHYDRAMINE HCL 25 MG PO CAPS: 25.0000 mg | ORAL_CAPSULE | Freq: Once | ORAL | Status: AC

## 2019-06-02 MED ORDER — SODIUM CHLORIDE FLUSH 0.9 % IV SOLN
INTRAVENOUS | Status: AC
  Administered 2019-06-02: 14:00:00 10 mL
  Filled 2019-06-02: qty 20

## 2019-06-02 MED ORDER — ACETAMINOPHEN 325 MG PO TABS
ORAL_TABLET | ORAL | Status: AC
  Administered 2019-06-02: 10:00:00 650 mg via ORAL
  Filled 2019-06-02: qty 2

## 2019-06-02 MED ORDER — HEPARIN SOD (PORK) LOCK FLUSH 100 UNIT/ML IV SOLN
INTRAVENOUS | Status: AC
  Administered 2019-06-02: 14:00:00 250 [IU]
  Filled 2019-06-02: qty 5

## 2019-06-02 MED ORDER — SODIUM CHLORIDE 0.9% IV SOLUTION: 250.0000 mL | Freq: Once | INTRAVENOUS | Status: DC

## 2019-06-02 MED ORDER — ACETAMINOPHEN 325 MG PO TABS: 650.0000 mg | ORAL_TABLET | Freq: Once | ORAL | Status: AC

## 2019-06-02 MED FILL — XOSPATA 40 MG TABS: 40 | 30 days supply | Qty: 60 | Fill #2

## 2019-06-02 NOTE — Assessment & Plan Note (Addendum)
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  NGS-FLT3 POSITIVE.   # Currently on gilteritinib-6m a day; Also Promacta.  Discussed with the family that no significant clinical response noted.  Patient still needing 2 platelets a week/PRBC transfusions once a week.  Discussed that I would recommend 1-2 more weeks of current therapy; if no clinical response noted then would recommend- vidaza/ venatoclax. .  # Leucopenia/ anemia/thrombocytopenia-White count 2.2; ANC- 0.3; /hemoglobin 7.0 platelets8:  secondary to underlying acute myeloid leukemia/Gilteritinib.   Proceed with platelet transfusion today/PRBC as needed.  #Supportive care for AML-  -Continue Noxafil/posaconazole 1048mand acyclovir 400 mg, and levaquin 250 mg for prophylaxis. Continue PICC line dressing changes weekly  #Spoke to patient husband regarding plan of care.  In agreement.   Disposition: # Transfuse 1 unit of platelets today;1 unit PRBC.  # Dec 23rd- 2 units SDP platelets; possible 1 unit PRBC.  # Follow up- Dec 28th; MD- labs-cbc/bmp;LDH; hold tube; possible 1 unit platelets/1 unit PRBC transfusion [SDS] # 12/31- labs- hold tube'cbc; possible 1 unit platelets/1 unit PRBC transfusion- # -Follow up- Jan 4th MD- labs-cbc/bmp;LDH; hold tube; possible 1 unit platelets/1 unit PRBC transfusion Dr.B

## 2019-06-02 NOTE — Progress Notes (Signed)
[place] OFFICE PROGRESS NOTE  Patient Care Team: Rusty Aus, MD as PCP - General (Internal Medicine)  CHIEF COMPLAINTS/PURPOSE OF CONSULTATION: Acute myeloid leukemia   Oncology History Overview Note  # SEP 2017- MYELOPROLIFERATIVE NEOPLASM- [WBC- 23; normal Hb/platelets] hypercellular bone marrow 90-95% proliferation of myeloid cells in various stages of maturation; relative erythroid hypoplasia and proliferation of atypical megakaryocytes; no increase in blasts; peripheral blood Bcr-Abl-NEG; Cytogenetics- WNL. NEG- Jak-2/MPL/CALR [place] limited- mild splenomegaly [~10cm; 463cm3]; OCT 2017- second opinion at [facility]. Surveillance.   # With progressive leukocytosis we evaluated her a month ago and sent BM for exam. She has a progressive leukocytosis so hydrea 521m daily was started on 10/20/2016 and increased to 2gm daily then back down to one daily, then 5 days per week over time due to counts drop. Then we held her hydrea since 04/09/2018 due to continued declining Hb and Plt.s  BMB 06/24/18 showed markedly hypercellular BM 95% with myeloid hyperplasia and atypical megakaryocytic hyperplasia. No significant increase in blasts. 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 BCR / ABL negative, FISH normal. Flow Showed 2%CD34-positive myeloid blasts. Myeloid precursors with low side scatter. Pathogenic variants were detected in the ASXL1, CCND2, CUX1, and U2AF1 genes.  # March 2020- wbc- 14/ Hb 9.5/ platelets- 54.   ---------------------------------------------------   # June 8th 2020- Acute myeloid leukemia [peripheral blood flow cytometry;NGS- pending]- June 15th Vidaza [SQ 1-5 + Venatoclax- #1in pt]; tumor lysis prophylaxis  # Day #28 bone marrow biopsy-  7/14- BMx- NO BLASTS; positive for dysplastic changes. OFF Venatoclax [since 7/17] sec to cytopenias.  #817 cycle #2 Vidaza+ venetoclax 100 mg; stop venetoclax on  8/24 [severe pancytopenia]  # 02/18/2019-start gilteritinib 80 mg a day; HELD on 9/14 [sec to severe pan]  # NOV 9th/2020-BMBx- RELAPSED AML; NOV 11th- STARTED GILTERITINIB 458mday; NOV17th 2020- PROMACTA 5039may; NOV 30th- inrease Gilt to 80 mg/day  # AUG 2020- AST elevation- 248-?sec to Posonoazole; RESOLVED; continue at lower dose 100 mg/day.   # 2002 [[place]] BREAST CA s/p Lumpect RT; [? Stage I] no chemo s/p AI  # F-ONE HEM: FLT3 ALTERATION NOTED  DIAGNOSIS: ACUTE MYELOID LEUKEMIA GOALS: pallaitive  CURRENT/MOST RECENT THERAPY : Gilteritinib     MDS (myelodysplastic syndrome) ([facility])  08/28/2018 Initial Diagnosis   MDS (my[facility]ysplastic syndrome) ([facility] Acute myeloid leukemia not having achieved remission ([facility]/2020 Initial Diagnosis   AML (acute myeloid leukemia) with failed remission ([facility] 11/25/2018 - 12/22/2018 Chemotherapy   The patient had azaCITIDine (VIDAZA) chemo injection 105 mg, 75 mg/m2 = 105 mg, Subcutaneous,  Once, 1 of 4 cycles Administration: 105 mg (11/25/2018), 105 mg (11/26/2018), 105 mg (11/27/2018), 105 mg (11/28/2018), 105 mg (11/29/2018)  for chemotherapy treatment.    01/27/2019 -  Chemotherapy   The patient had palonosetron (ALOXI) injection 0.25 mg, 0.25 mg, Intravenous,  Once, 1 of 4 cycles Administration: 0.25 mg (01/27/2019), 0.25 mg (01/29/2019), 0.25 mg (01/31/2019) azaCITIDine (VIDAZA) 100 mg in sodium chloride 0.9 % 50 mL chemo infusion, 75 mg/m2 = 100 mg, Intravenous, Once, 1 of 4 cycles Administration: 100 mg (01/27/2019), 100 mg (01/28/2019), 100 mg (01/29/2019), 100 mg (01/30/2019), 100 mg (01/31/2019)  for chemotherapy treatment.      HISTORY OF PRESENTING ILLNESS:  Kathryn Hahn 85 12o.  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  Patient denies any fevers or chills.  No nausea  or vomiting.  Continues to  have easy bruising.  Continues to have blisters in her mouth.  She continues to be ambulatory.  No blood in stools or black or stools.  Review of Systems  Constitutional: Positive for malaise/fatigue. Negative for chills, diaphoresis, fever and weight loss.  HENT: Negative for nosebleeds and sore throat.   Eyes: Negative for double vision.  Respiratory: Negative for cough, hemoptysis, sputum production and wheezing.   Cardiovascular: Negative for chest pain, palpitations and orthopnea.  Gastrointestinal: Negative for abdominal pain, blood in stool, constipation, diarrhea, heartburn, melena, nausea and vomiting.  Genitourinary: Negative for dysuria, frequency and urgency.  Musculoskeletal: Positive for back pain. Negative for joint pain.  Skin: Negative.  Negative for itching and rash.  Neurological: Negative for dizziness, tingling, focal weakness, weakness and headaches.  Endo/Heme/Allergies: Bruises/bleeds easily.  Psychiatric/Behavioral: Negative for depression. The patient is not nervous/anxious and does not have insomnia.     MEDICAL HISTORY:  Past Medical History:  Diagnosis Date  . Anemia   . Arthritis   . Breast cancer ([place]) 2003   RT LUMPECTOMY  . Edema leg   . History of breast cancer 2003   post lumpectomy  . History of kidney stones   . Hyperlipemia, mixed   . Hypertension, essential   . Leukemia ([place])   . Leukocytosis   . MDS (myelodysplastic syndrome) ([place])   . Personal history of radiation therapy 2003   BREAST CA  . Renal stones   . Vitamin D deficiency     SURGICAL HISTORY: Past Surgical History:  Procedure Laterality Date  . BREAST BIOPSY Right 2003   Postive for cancer  . BREAST LUMPECTOMY Right 2003   BREAST CA  . KIDNEY STONE SURGERY Right     SOCIAL HISTORY: Social History   Socioeconomic History  . Marital status: Married    Spouse name: Not on file  . Number of children: Not on file  . Years of education: Not on file  . Highest  education level: Not on file  Occupational History  . Not on file  Tobacco Use  . Smoking status: Never Smoker  . Smokeless tobacco: Never Used  Substance and Sexual Activity  . Alcohol use: Yes    Alcohol/week: 3.0 standard drinks    Types: 3 Glasses of wine per week  . Drug use: No  . Sexual activity: Not on file  Other Topics Concern  . Not on file  Social History Narrative    No smoking/alcohol; lives in [place] with family.  She lives in assisted living with her husband.   Social Determinants of Health   Financial Resource Strain:   . Difficulty of Paying Living Expenses: Not on file  Food Insecurity:   . Worried About Charity fundraiser in the Last Year: Not on file  . Ran Out of Food in the Last Year: Not on file  Transportation Needs:   . Lack of Transportation (Medical): Not on file  . Lack of Transportation (Non-Medical): Not on file  Physical Activity:   . Days of Exercise per Week: Not on file  . Minutes of Exercise per Session: Not on file  Stress:   . Feeling of Stress : Not on file  Social Connections:   . Frequency of Communication with Friends and Family: Not on file  . Frequency of Social Gatherings with Friends and Family: Not on file  . Attends Religious Services: Not on file  . Active Member of Clubs or Organizations:  Not on file  . Attends Archivist Meetings: Not on file  . Marital Status: Not on file  Intimate Partner Violence:   . Fear of Current or Ex-Partner: Not on file  . Emotionally Abused: Not on file  . Physically Abused: Not on file  . Sexually Abused: Not on file    FAMILY HISTORY: Family History  Problem Relation Age of Onset  . Stroke Mother   . Hypertension Father   . Stroke Father   . Breast cancer Neg Hx     ALLERGIES:  is allergic to terbinafine.  MEDICATIONS:  Current Outpatient Medications  Medication Sig Dispense Refill  . acetaminophen (TYLENOL) 325 MG tablet Take 325 mg by mouth every 6 (six) hours  as needed for mild pain or moderate pain.     Marland Kitchen acyclovir (ZOVIRAX) 400 MG tablet ONE PILL A DAY [TO PREVENT SHINGLES] 90 tablet 1  . atenolol (TENORMIN) 50 MG tablet Take 1 tablet by mouth 2 (two) times daily.    . calcium carbonate (OSCAL) 1500 (600 Ca) MG TABS tablet Take 600 mg of elemental calcium by mouth daily with breakfast.    . Cholecalciferol (VITAMIN D) 2000 units tablet Take 1 tablet by mouth daily.    Marland Kitchen eltrombopag (PROMACTA) 50 MG tablet Take 1 tablet (50 mg total) by mouth daily. Take on an empty stomach 1 hour before a meal or 2 hours after 30 tablet 3  . Gilteritinib Fumarate (XOSPATA) 40 MG TABS Take 80 mg by mouth daily. 60 tablet 2  . heparin lock flush 100 UNIT/ML SOLN injection Inject into the vein.    . Infant Care Products (DERMACLOUD) CREA Apply topically.    Marland Kitchen levofloxacin (LEVAQUIN) 250 MG tablet Take 1 tablet (250 mg total) by mouth daily. 30 tablet 3  . losartan-hydrochlorothiazide (HYZAAR) 50-12.5 MG tablet Take 1 tablet by mouth daily.    . magic mouthwash w/lidocaine SOLN Take 5 mLs by mouth 4 (four) times daily as needed for mouth pain. 480 mL 3  . magnesium hydroxide (MILK OF MAGNESIA) 400 MG/5ML suspension Take 5 mLs by mouth every 4 (four) hours as needed.    . ondansetron (ZOFRAN) 8 MG tablet Take by mouth every 8 (eight) hours as needed for nausea or vomiting.    . polyethylene glycol (MIRALAX / GLYCOLAX) 17 g packet Take 17 g by mouth daily.    . posaconazole (NOXAFIL) 100 MG TBEC delayed-release tablet Take 100 mg by mouth daily.    . potassium chloride SA (KLOR-CON M20) 20 MEQ tablet Take 1 tablet (20 mEq total) by mouth 2 (two) times daily. 60 tablet 2  . senna (SENOKOT) 8.6 MG tablet Take 2 tablets by mouth 2 (two) times daily.     No current facility-administered medications for this visit.   Facility-Administered Medications Ordered in Other Visits  Medication Dose Route Frequency Provider Last Rate Last Admin  . 0.9 %  sodium chloride infusion  (Manually program via Guardrails IV Fluids)  250 mL Intravenous Once Charlaine Dalton R, MD      . 0.9 %  sodium chloride infusion   Intravenous Continuous Jacquelin Hawking, NP   Stopped at 02/05/19 1630  . heparin lock flush 100 UNIT/ML injection           . heparin lock flush 100 unit/mL  250 Units Intracatheter Prior to discharge Charlaine Dalton R, MD      . sodium chloride flush 0.9 % injection  PHYSICAL EXAMINATION: ECOG PERFORMANCE STATUS: 1 - Symptomatic but completely ambulatory  Vitals:   06/02/19 0831  BP: (!) 143/75  Pulse: 68  Temp: 97.6 F (36.4 C)   Filed Weights   06/02/19 0831  Weight: 97 lb 9.6 oz (44.3 kg)    Physical Exam  Constitutional: She is oriented to person, place, and time and well-developed, well-nourished, and in no distress.  PICC line in place.  No active bleeding.  Masked.  In a wheelchair.  Accompanied by husband.  HENT:  Head: Normocephalic and atraumatic.  Mouth/Throat: Oropharynx is clear and moist. No oropharyngeal exudate.  Eyes: Conjunctivae are normal. No scleral icterus.  Cardiovascular: Normal rate and regular rhythm.  Pulmonary/Chest: Effort normal. No respiratory distress.  Abdominal: Soft. Bowel sounds are normal. She exhibits no distension and no mass. There is no abdominal tenderness. There is no rebound and no guarding.  Musculoskeletal:        General: No tenderness or edema. Normal range of motion.     Cervical back: Normal range of motion and neck supple.     Comments: Sitting in wheelchair  Neurological: She is alert and oriented to person, place, and time.  Skin: Skin is warm.  Multiple bruises on lower legs, few on hands and arms. Torso bruising improved/mostly resolved  Psychiatric: Affect normal.    LABORATORY DATA:  I have reviewed the data as listed Lab Results  Component Value Date   WBC 2.2 (L) 06/02/2019   HGB 7.3 (L) 06/02/2019   HCT 23.3 (L) 06/02/2019   MCV 87.6 06/02/2019   PLT 8 (LL)  06/02/2019   Recent Labs    05/19/19 0810 05/26/19 0845 06/02/19 0808  NA 138 137 133*  K 3.4* 3.9 3.6  CL 102 101 99  CO2 _0 GLUCOSE 148* 141* 181*  BUN 30* 38* 33*  CREATININE 0.91 0.96 0.95  CALCIUM 9.1 9.0 9.0  GFRNONAA 59* 55* 55*  GFRAA >60 >60 >60  PROT 6.1* 6.1* 6.2*  ALBUMIN 3.5 3.6 3.6  AST 40 44* 58*  ALT _1 ALKPHOS 70 66 67  BILITOT 0.6 0.5 0.5    RADIOGRAPHIC STUDIES: I have personally reviewed the radiological images as listed and agreed with the findings in the report. No results found.  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  NGS-FLT3 POSITIVE.   # Currently on gilteritinib-76m a day; Also Promacta.  Discussed with the family that no significant clinical response noted.  Patient still needing 2 platelets a week/PRBC transfusions once a week.  Discussed that I would recommend 1-2 more weeks of current therapy; if no clinical response noted then would recommend- vidaza/ venatoclax. .  # Leucopenia/ anemia/thrombocytopenia-White count 2.2; ANC- 0.3; /hemoglobin 7.0 platelets8:  secondary to underlying acute myeloid leukemia/Gilteritinib.   Proceed with platelet transfusion today/PRBC as needed.  #Supportive care for AML-  -Continue Noxafil/posaconazole 1071mand acyclovir 400 mg, and levaquin 250 mg for prophylaxis. Continue PICC line dressing changes weekly  #Spoke to patient husband regarding plan of care.  In agreement.   Disposition: # Transfuse 1 unit of platelets today;1 unit PRBC.  # Dec 23rd- 2 units SDP platelets; possible 1 unit PRBC.  # Follow up- Dec 28th; MD- labs-cbc/bmp;LDH; hold tube; possible 1 unit platelets/1 unit PRBC transfusion [SDS] # 12/31- labs- hold tube'cbc; possible 1 unit platelets/1 unit PRBC  transfusion- # -Follow up- Jan 4th MD-  labs-cbc/bmp;LDH; hold tube; possible 1 unit platelets/1 unit PRBC transfusion Dr.B

## 2019-06-03 ENCOUNTER — Other Ambulatory Visit: Payer: Self-pay

## 2019-06-03 ENCOUNTER — Other Ambulatory Visit: Payer: Self-pay | Admitting: Pharmacist

## 2019-06-03 ENCOUNTER — Other Ambulatory Visit: Payer: Self-pay | Admitting: Internal Medicine

## 2019-06-03 DIAGNOSIS — C9200 Acute myeloblastic leukemia, not having achieved remission: Secondary | ICD-10-CM

## 2019-06-03 LAB — BPAM RBC
Blood Product Expiration Date: 202012282359
ISSUE DATE / TIME: 202012210947
Unit Type and Rh: 9500

## 2019-06-03 LAB — TYPE AND SCREEN
ABO/RH(D): O POS
Antibody Screen: NEGATIVE
Unit division: 0

## 2019-06-03 LAB — PREPARE PLATELET PHERESIS: Unit division: 0

## 2019-06-03 LAB — BPAM PLATELET PHERESIS
Blood Product Expiration Date: 202012242359
ISSUE DATE / TIME: 202012211251
Unit Type and Rh: 5100

## 2019-06-03 MED ORDER — POSACONAZOLE 100 MG PO TBEC: 100.0000 mg | DELAYED_RELEASE_TABLET | Freq: Every day | ORAL | 3 refills | Status: DC

## 2019-06-03 NOTE — Telephone Encounter (Signed)
   Ref Range & Units 1 d ago  Potassium 3.5 - 5.1 mmol/L 3.6

## 2019-06-03 NOTE — Telephone Encounter (Signed)
Oral Oncology Patient Advocate Encounter  Alyson, oral oncology pharmacist, called [employer] to ask about new dispensing pharmacy and inquired about patients renewal application.  Rep stated they had not received the application and did not accept them by mail.  She was given the fax number [phone] and an alternate fax of [phone].  I tried faxing the application to [phone] twice, but it would not go through, stating "the call could not be completed at this time."  I faxed the application to [phone] and it was successfully sent.  [place] Patient [place] Phone [phone] Fax [phone] 06/03/2019 2:30 PM

## 2019-06-03 NOTE — Telephone Encounter (Signed)
Oral Oncology Patient Advocate Encounter  Received a voicemail from Bell[place] at [employer] [facility] to call back regarding reference # [id number].  I called [facility] [facility] and spoke with [place].  Janace Hoard stated that the patient has been approved for their program to receive Noxafil until 06/11/2020.  [place] Patient [place] Phone [phone] Fax [phone] 06/03/2019 2:34 PM

## 2019-06-04 ENCOUNTER — Other Ambulatory Visit: Payer: Self-pay | Admitting: *Deleted

## 2019-06-04 ENCOUNTER — Inpatient Hospital Stay: Payer: Medicare Other

## 2019-06-04 ENCOUNTER — Other Ambulatory Visit: Payer: Self-pay

## 2019-06-04 DIAGNOSIS — C9200 Acute myeloblastic leukemia, not having achieved remission: Secondary | ICD-10-CM

## 2019-06-04 DIAGNOSIS — C9202 Acute myeloblastic leukemia, in relapse: Secondary | ICD-10-CM | POA: Diagnosis not present

## 2019-06-04 LAB — CBC WITH DIFFERENTIAL/PLATELET
Abs Immature Granulocytes: 0 10*3/uL (ref 0.00–0.07)
Basophils Absolute: 0 10*3/uL (ref 0.0–0.1)
Basophils Relative: 0 %
Blasts: 26 %
Eosinophils Absolute: 0 10*3/uL (ref 0.0–0.5)
Eosinophils Relative: 0 %
HCT: 26.3 % — ABNORMAL LOW (ref 36.0–46.0)
Hemoglobin: 8.4 g/dL — ABNORMAL LOW (ref 12.0–15.0)
Lymphocytes Relative: 37 %
Lymphs Abs: 0.9 10*3/uL (ref 0.7–4.0)
MCH: 27.5 pg (ref 26.0–34.0)
MCHC: 31.9 g/dL (ref 30.0–36.0)
MCV: 86.2 fL (ref 80.0–100.0)
Metamyelocytes Relative: 1 %
Monocytes Absolute: 0.6 10*3/uL (ref 0.1–1.0)
Monocytes Relative: 26 %
Neutro Abs: 0.2 10*3/uL — ABNORMAL LOW (ref 1.7–7.7)
Neutrophils Relative %: 10 %
Platelets: 12 10*3/uL — CL (ref 150–400)
RBC: 3.05 MIL/uL — ABNORMAL LOW (ref 3.87–5.11)
RDW: 17.5 % — ABNORMAL HIGH (ref 11.5–15.5)
WBC: 2.4 10*3/uL — ABNORMAL LOW (ref 4.0–10.5)
nRBC: 6.6 % — ABNORMAL HIGH (ref 0.0–0.2)

## 2019-06-04 LAB — COMPREHENSIVE METABOLIC PANEL
ALT: 29 U/L (ref 0–44)
AST: 62 U/L — ABNORMAL HIGH (ref 15–41)
Albumin: 3.6 g/dL (ref 3.5–5.0)
Alkaline Phosphatase: 70 U/L (ref 38–126)
Anion gap: 12 (ref 5–15)
BUN: 33 mg/dL — ABNORMAL HIGH (ref 8–23)
CO2: 24 mmol/L (ref 22–32)
Calcium: 8.9 mg/dL (ref 8.9–10.3)
Chloride: 98 mmol/L (ref 98–111)
Creatinine, Ser: 0.92 mg/dL (ref 0.44–1.00)
GFR calc Af Amer: >60 mL/min (ref >60)
GFR calc non Af Amer: 58 mL/min — ABNORMAL LOW (ref >60)
Glucose, Bld: 168 mg/dL — ABNORMAL HIGH (ref 70–99)
Potassium: 3.8 mmol/L (ref 3.5–5.1)
Sodium: 134 mmol/L — ABNORMAL LOW (ref 135–145)
Total Bilirubin: 0.6 mg/dL (ref 0.3–1.2)
Total Protein: 6 g/dL — ABNORMAL LOW (ref 6.5–8.1)

## 2019-06-04 LAB — SAMPLE TO BLOOD BANK

## 2019-06-04 LAB — LACTATE DEHYDROGENASE: LDH: 1599 U/L — ABNORMAL HIGH (ref 98–192)

## 2019-06-04 MED ORDER — DIPHENHYDRAMINE HCL 25 MG PO CAPS
25.0000 mg | ORAL_CAPSULE | Freq: Once | ORAL | Status: AC
  Administered 2019-06-04: 25 mg via ORAL
  Filled 2019-06-04: qty 1

## 2019-06-04 MED ORDER — HEPARIN SOD (PORK) LOCK FLUSH 100 UNIT/ML IV SOLN
250.0000 [IU] | Freq: Once | INTRAVENOUS | Status: AC
  Filled 2019-06-04: qty 5

## 2019-06-04 MED ORDER — ACETAMINOPHEN 325 MG PO TABS
650.0000 mg | ORAL_TABLET | Freq: Once | ORAL | Status: AC
  Administered 2019-06-04: 650 mg via ORAL
  Filled 2019-06-04: qty 2

## 2019-06-04 MED ORDER — HEPARIN SOD (PORK) LOCK FLUSH 100 UNIT/ML IV SOLN
INTRAVENOUS | Status: AC
  Filled 2019-06-04: qty 5

## 2019-06-04 MED ORDER — SODIUM CHLORIDE 0.9% IV SOLUTION
250.0000 mL | Freq: Once | INTRAVENOUS | Status: AC
  Administered 2019-06-04: 10:00:00 250 mL via INTRAVENOUS
  Filled 2019-06-04: qty 250

## 2019-06-05 ENCOUNTER — Encounter: Payer: Self-pay | Admitting: Internal Medicine

## 2019-06-05 ENCOUNTER — Other Ambulatory Visit: Payer: Self-pay

## 2019-06-05 ENCOUNTER — Other Ambulatory Visit: Payer: Medicare Other

## 2019-06-05 ENCOUNTER — Ambulatory Visit: Payer: Medicare Other

## 2019-06-05 LAB — PREPARE PLATELET PHERESIS
Unit division: 0
Unit division: 0

## 2019-06-05 LAB — BPAM PLATELET PHERESIS
Blood Product Expiration Date: 202012252359
Blood Product Expiration Date: 202012262359
ISSUE DATE / TIME: 202012231047
ISSUE DATE / TIME: 202012231209
Unit Type and Rh: 5100
Unit Type and Rh: 7300

## 2019-06-09 ENCOUNTER — Other Ambulatory Visit: Payer: Self-pay

## 2019-06-09 ENCOUNTER — Inpatient Hospital Stay (HOSPITAL_BASED_OUTPATIENT_CLINIC_OR_DEPARTMENT_OTHER): Payer: Medicare Other | Admitting: Internal Medicine

## 2019-06-09 ENCOUNTER — Other Ambulatory Visit: Payer: Self-pay | Admitting: *Deleted

## 2019-06-09 ENCOUNTER — Inpatient Hospital Stay: Payer: Medicare Other

## 2019-06-09 DIAGNOSIS — C9200 Acute myeloblastic leukemia, not having achieved remission: Secondary | ICD-10-CM

## 2019-06-09 DIAGNOSIS — C9202 Acute myeloblastic leukemia, in relapse: Secondary | ICD-10-CM | POA: Diagnosis not present

## 2019-06-09 LAB — CBC WITH DIFFERENTIAL/PLATELET
Abs Immature Granulocytes: 0 10*3/uL (ref 0.00–0.07)
Basophils Absolute: 0 10*3/uL (ref 0.0–0.1)
Basophils Relative: 0 %
Blasts: 25 %
Eosinophils Absolute: 0 10*3/uL (ref 0.0–0.5)
Eosinophils Relative: 0 %
HCT: 23.9 % — ABNORMAL LOW (ref 36.0–46.0)
Hemoglobin: 7.5 g/dL — ABNORMAL LOW (ref 12.0–15.0)
Lymphocytes Relative: 25 %
Lymphs Abs: 0.5 10*3/uL — ABNORMAL LOW (ref 0.7–4.0)
MCH: 27.1 pg (ref 26.0–34.0)
MCHC: 31.4 g/dL (ref 30.0–36.0)
MCV: 86.3 fL (ref 80.0–100.0)
Monocytes Absolute: 0.7 10*3/uL (ref 0.1–1.0)
Monocytes Relative: 35 %
Neutro Abs: 0.3 10*3/uL — ABNORMAL LOW (ref 1.7–7.7)
Neutrophils Relative %: 15 %
Platelets: 8 10*3/uL — CL (ref 150–400)
RBC: 2.77 MIL/uL — ABNORMAL LOW (ref 3.87–5.11)
RDW: 17.8 % — ABNORMAL HIGH (ref 11.5–15.5)
Smear Review: DECREASED
WBC Morphology: ABNORMAL
WBC: 1.9 10*3/uL — ABNORMAL LOW (ref 4.0–10.5)
nRBC: 5.9 % — ABNORMAL HIGH (ref 0.0–0.2)

## 2019-06-09 LAB — COMPREHENSIVE METABOLIC PANEL
ALT: 29 U/L (ref 0–44)
AST: 55 U/L — ABNORMAL HIGH (ref 15–41)
Albumin: 3.4 g/dL — ABNORMAL LOW (ref 3.5–5.0)
Alkaline Phosphatase: 73 U/L (ref 38–126)
Anion gap: 10 (ref 5–15)
BUN: 26 mg/dL — ABNORMAL HIGH (ref 8–23)
CO2: 27 mmol/L (ref 22–32)
Calcium: 9.1 mg/dL (ref 8.9–10.3)
Chloride: 100 mmol/L (ref 98–111)
Creatinine, Ser: 0.86 mg/dL (ref 0.44–1.00)
GFR calc Af Amer: >60 mL/min (ref >60)
GFR calc non Af Amer: >60 mL/min (ref >60)
Glucose, Bld: 166 mg/dL — ABNORMAL HIGH (ref 70–99)
Potassium: 3.5 mmol/L (ref 3.5–5.1)
Sodium: 137 mmol/L (ref 135–145)
Total Bilirubin: 0.4 mg/dL (ref 0.3–1.2)
Total Protein: 6.1 g/dL — ABNORMAL LOW (ref 6.5–8.1)

## 2019-06-09 LAB — LACTATE DEHYDROGENASE: LDH: 1588 U/L — ABNORMAL HIGH (ref 98–192)

## 2019-06-09 MED ORDER — HEPARIN SOD (PORK) LOCK FLUSH 100 UNIT/ML IV SOLN
250.0000 [IU] | Freq: Once | INTRAVENOUS | Status: AC
  Administered 2019-06-09: 250 [IU] via INTRAVENOUS
  Filled 2019-06-09: qty 5

## 2019-06-09 MED ORDER — ACETAMINOPHEN 325 MG PO TABS
650.0000 mg | ORAL_TABLET | Freq: Once | ORAL | Status: AC
  Administered 2019-06-09: 650 mg via ORAL
  Filled 2019-06-09: qty 2

## 2019-06-09 MED ORDER — SODIUM CHLORIDE 0.9% FLUSH
10.0000 mL | Freq: Once | INTRAVENOUS | Status: AC
  Administered 2019-06-09: 09:00:00 10 mL via INTRAVENOUS
  Filled 2019-06-09: qty 10

## 2019-06-09 MED ORDER — ACETAMINOPHEN 325 MG PO TABS: 650.0000 mg | ORAL_TABLET | Freq: Once | ORAL | Status: DC

## 2019-06-09 MED ORDER — DIPHENHYDRAMINE HCL 25 MG PO CAPS
25.0000 mg | ORAL_CAPSULE | Freq: Once | ORAL | Status: DC
  Filled 2019-06-09: qty 1

## 2019-06-09 MED ORDER — SODIUM CHLORIDE 0.9% IV SOLUTION
250.0000 mL | Freq: Once | INTRAVENOUS | Status: AC
  Administered 2019-06-09: 250 mL via INTRAVENOUS
  Filled 2019-06-09: qty 250

## 2019-06-09 MED ORDER — DIPHENHYDRAMINE HCL 25 MG PO CAPS: 25.0000 mg | ORAL_CAPSULE | Freq: Once | ORAL | Status: DC

## 2019-06-09 NOTE — Assessment & Plan Note (Addendum)
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  NGS-FLT3 POSITIVE.   # Currently on gilteritinib-72m a day; Also Promacta x6 weeks-no significant clinical/hematologic response noted.  Concerning for progression.  Recommend a bone marrow biopsy for further evaluation.  If patient has continued disease burden on bone marrow, I would recommend- vidaza/ venatoclax.  Continue gilteritinib/Promacta for now.  # Leucopenia/ anemia/thrombocytopenia-White count 2.2; ANC- 0.3; /hemoglobin 7.0 platelets8:  secondary to underlying acute myeloid leukemia/Gilteritinib.   Proceed with platelet transfusion today/PRBC as needed.  #Supportive care for AML-  -Continue Noxafil/posaconazole 1067mand acyclovir 400 mg, and levaquin 250 mg for prophylaxis. Continue PICC line dressing changes weekly  #Spoke to patient husband regarding plan of care.  In agreement.  Also discussed with Dr.Rizzzeri from [facility] Disposition: # 1 unit platelets today; #1 unit PRBC tomorrow # 12/31- labs- hold tube'cbc; possible 1 unit platelets/1 unit PRBC transfusion- # Follow up- Jan 4th MD- labs-cbc/bmp;LDH; hold tube;posible transfusion; likely admission to hospital- Dr.B  Addendum: recommend bone marrow bx/ early next week.

## 2019-06-09 NOTE — Progress Notes (Signed)
[facility] OFFICE PROGRESS NOTE  Patient Care Team: Rusty Aus, MD as PCP - General (Internal Medicine)  CHIEF COMPLAINTS/PURPOSE OF CONSULTATION: Acute myeloid leukemia   Oncology History Overview Note  # SEP 2017- MYELOPROLIFERATIVE NEOPLASM- [WBC- 4; normal Hb/platelets] hypercellular bone marrow 90-95% proliferation of myeloid cells in various stages of maturation; relative erythroid hypoplasia and proliferation of atypical megakaryocytes; no increase in blasts; peripheral blood Bcr-Abl-NEG; Cytogenetics- WNL. NEG- Jak-2/MPL/CALR [place] limited- mild splenomegaly [~10cm; 463cm3]; OCT 2017- second opinion at [facility]. Surveillance.   # With progressive leukocytosis we evaluated her a month ago and sent BM for exam. She has a progressive leukocytosis so hydrea '500mg'$  daily was started on 10/20/2016 and increased to 2gm daily then back down to one daily, then 5 days per week over time due to counts drop. Then we held her hydrea since 04/09/2018 due to continued declining Hb and Plt.s  BMB 06/24/18 showed markedly hypercellular BM 95% with myeloid hyperplasia and atypical megakaryocytic hyperplasia. No significant increase in blasts. 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 BCR / ABL negative, FISH normal. Flow Showed 2%CD34-positive myeloid blasts. Myeloid precursors with low side scatter. Pathogenic variants were detected in the ASXL1, CCND2, CUX1, and U2AF1 genes.  # March 2020- wbc- 14/ Hb 9.5/ platelets- 54.   ---------------------------------------------------   # June 8th 2020- Acute myeloid leukemia [peripheral blood flow cytometry;NGS- pending]- June 15th Vidaza [SQ 1-5 + Venatoclax- #1in pt]; tumor lysis prophylaxis  # Day #28 bone marrow biopsy-  7/14- BMx- NO BLASTS; positive for dysplastic changes. OFF Venatoclax [since 7/17] sec to cytopenias.  #817 cycle #2 Vidaza+ venetoclax 100 mg; stop venetoclax on  8/24 [severe pancytopenia]   # 02/18/2019-start gilteritinib 80 mg a day; HELD on 9/14 [sec to severe pan]  # NOV 9th/2020-BMBx- RELAPSED AML; NOV 11th- STARTED GILTERITINIB '40mg'$ /day; NOV17th 2020- PROMACTA '50mg'$ /day; NOV 30th- inrease Gilt to 80 mg/day; STOP Gilt/ Promatca [no reponse 12/30-   # AUG 2020- AST elevation- 248-?sec to Posonoazole; RESOLVED; continue at lower dose 100 mg/day.   # 2002 [[place]] BREAST CA s/p Lumpect RT; [? Stage I] no chemo s/p AI  # F-ONE HEM: FLT3 ALTERATION NOTED  DIAGNOSIS: ACUTE MYELOID LEUKEMIA GOALS: pallaitive  CURRENT/MOST RECENT THERAPY : Gilteritinib     MDS (myelodysplastic syndrome) ([place]) (Resolved)  08/28/2018 Initial Diagnosis   MDS (myelodysplastic syndrome) ([place])   Acute myeloid leukemia not having achieved remission ([place])  11/25/2018 Initial Diagnosis   AML (acute myeloid leukemia) with failed remission ([place])   11/25/2018 - 12/22/2018 Chemotherapy   The patient had azaCITIDine (VIDAZA) chemo injection 105 mg, 75 mg/m2 = 105 mg, Subcutaneous,  Once, 1 of 4 cycles Administration: 105 mg (11/25/2018), 105 mg (11/26/2018), 105 mg (11/27/2018), 105 mg (11/28/2018), 105 mg (11/29/2018)  for chemotherapy treatment.    01/27/2019 -  Chemotherapy   The patient had palonosetron (ALOXI) injection 0.25 mg, 0.25 mg, Intravenous,  Once, 1 of 4 cycles Administration: 0.25 mg (01/27/2019), 0.25 mg (01/29/2019), 0.25 mg (01/31/2019) azaCITIDine (VIDAZA) 100 mg in sodium chloride 0.9 % 50 mL chemo infusion, 75 mg/m2 = 100 mg, Intravenous, Once, 1 of 4 cycles Administration: 100 mg (01/27/2019), 100 mg (01/28/2019), 100 mg (01/29/2019), 100 mg (01/30/2019), 100 mg (01/31/2019)  for chemotherapy treatment.      HISTORY OF PRESENTING ILLNESS:  Kathryn Hahn 85 y.o.  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  Patient  continues to complain of  easy bruising.  Continues to have "blood blisters" in the mouth.  Otherwise no fevers or chills.  Nausea no vomiting.  No diarrhea.  Bleeding gums.  No nosebleeds. Review of Systems  Constitutional: Positive for malaise/fatigue. Negative for chills, diaphoresis, fever and weight loss.  HENT: Negative for nosebleeds and sore throat.   Eyes: Negative for double vision.  Respiratory: Negative for cough, hemoptysis, sputum production and wheezing.   Cardiovascular: Negative for chest pain, palpitations and orthopnea.  Gastrointestinal: Negative for abdominal pain, blood in stool, constipation, diarrhea, heartburn, melena, nausea and vomiting.  Genitourinary: Negative for dysuria, frequency and urgency.  Musculoskeletal: Positive for back pain. Negative for joint pain.  Skin: Negative.  Negative for itching and rash.  Neurological: Negative for dizziness, tingling, focal weakness, weakness and headaches.  Endo/Heme/Allergies: Bruises/bleeds easily.  Psychiatric/Behavioral: Negative for depression. The patient is not nervous/anxious and does not have insomnia.     MEDICAL HISTORY:  Past Medical History:  Diagnosis Date  . Anemia   . Arthritis   . Breast cancer ([place]) 2003   RT LUMPECTOMY  . Edema leg   . History of breast cancer 2003   post lumpectomy  . History of kidney stones   . Hyperlipemia, mixed   . Hypertension, essential   . Leukemia ([place])   . Leukocytosis   . MDS (myelodysplastic syndrome) ([place])   . Personal history of radiation therapy 2003   BREAST CA  . Renal stones   . Vitamin D deficiency     SURGICAL HISTORY: Past Surgical History:  Procedure Laterality Date  . BREAST BIOPSY Right 2003   Postive for cancer  . BREAST LUMPECTOMY Right 2003   BREAST CA  . KIDNEY STONE SURGERY Right     SOCIAL HISTORY: Social History   Socioeconomic History  . Marital status: Married    Spouse name: Not on file  . Number of children: Not on file  . Years of education: Not  on file  . Highest education level: Not on file  Occupational History  . Not on file  Tobacco Use  . Smoking status: Never Smoker  . Smokeless tobacco: Never Used  Substance and Sexual Activity  . Alcohol use: Yes    Alcohol/week: 3.0 standard drinks    Types: 3 Glasses of wine per week  . Drug use: No  . Sexual activity: Not on file  Other Topics Concern  . Not on file  Social History Narrative    No smoking/alcohol; lives in [place] with family.  She lives in assisted living with her husband.   Social Determinants of Health   Financial Resource Strain:   . Difficulty of Paying Living Expenses: Not on file  Food Insecurity:   . Worried About Charity fundraiser in the Last Year: Not on file  . Ran Out of Food in the Last Year: Not on file  Transportation Needs:   . Lack of Transportation (Medical): Not on file  . Lack of Transportation (Non-Medical): Not on file  Physical Activity:   . Days of Exercise per Week: Not on file  . Minutes of Exercise per Session: Not on file  Stress:   . Feeling of Stress : Not on file  Social Connections:   . Frequency of Communication with Friends and Family: Not on file  . Frequency of Social Gatherings with Friends and Family: Not on file  . Attends Religious Services: Not on file  . Active Member of Clubs  or Organizations: Not on file  . Attends Archivist Meetings: Not on file  . Marital Status: Not on file  Intimate Partner Violence:   . Fear of Current or Ex-Partner: Not on file  . Emotionally Abused: Not on file  . Physically Abused: Not on file  . Sexually Abused: Not on file    FAMILY HISTORY: Family History  Problem Relation Age of Onset  . Stroke Mother   . Hypertension Father   . Stroke Father   . Breast cancer Neg Hx     ALLERGIES:  is allergic to terbinafine.  MEDICATIONS:  Current Outpatient Medications  Medication Sig Dispense Refill  . acetaminophen (TYLENOL) 325 MG tablet Take 325 mg by mouth  every 6 (six) hours as needed for mild pain or moderate pain.     Marland Kitchen acyclovir (ZOVIRAX) 400 MG tablet ONE PILL A DAY [TO PREVENT SHINGLES] 90 tablet 1  . atenolol (TENORMIN) 50 MG tablet Take 1 tablet by mouth 2 (two) times daily.    . calcium carbonate (OSCAL) 1500 (600 Ca) MG TABS tablet Take 600 mg of elemental calcium by mouth daily with breakfast.    . Cholecalciferol (VITAMIN D) 2000 units tablet Take 1 tablet by mouth daily.    Marland Kitchen eltrombopag (PROMACTA) 50 MG tablet Take 1 tablet (50 mg total) by mouth daily. Take on an empty stomach 1 hour before a meal or 2 hours after 30 tablet 3  . Gilteritinib Fumarate (XOSPATA) 40 MG TABS Take 80 mg by mouth daily. 60 tablet 2  . heparin lock flush 100 UNIT/ML SOLN injection Inject into the vein.    . Infant Care Products (DERMACLOUD) CREA Apply topically.    Marland Kitchen levofloxacin (LEVAQUIN) 250 MG tablet Take 1 tablet (250 mg total) by mouth daily. 30 tablet 3  . losartan-hydrochlorothiazide (HYZAAR) 50-12.5 MG tablet Take 1 tablet by mouth daily.    . magic mouthwash w/lidocaine SOLN Take 5 mLs by mouth 4 (four) times daily as needed for mouth pain. 480 mL 3  . magnesium hydroxide (MILK OF MAGNESIA) 400 MG/5ML suspension Take 5 mLs by mouth every 4 (four) hours as needed.    . ondansetron (ZOFRAN) 8 MG tablet Take by mouth every 8 (eight) hours as needed for nausea or vomiting.    . polyethylene glycol (MIRALAX / GLYCOLAX) 17 g packet Take 17 g by mouth daily.    . posaconazole (NOXAFIL) 100 MG TBEC delayed-release tablet Take 1 tablet (100 mg total) by mouth daily. 30 tablet 3  . Potassium Chloride ER 20 MEQ TBCR Take 20 mEq by mouth daily. 60 tablet 1  . potassium chloride SA (KLOR-CON M20) 20 MEQ tablet Take 1 tablet (20 mEq total) by mouth 2 (two) times daily. 60 tablet 2  . senna (SENOKOT) 8.6 MG tablet Take 2 tablets by mouth 2 (two) times daily.     No current facility-administered medications for this visit.   Facility-Administered Medications  Ordered in Other Visits  Medication Dose Route Frequency Provider Last Rate Last Admin  . 0.9 %  sodium chloride infusion   Intravenous Continuous Jacquelin Hawking, NP   Stopped at 02/05/19 1630  . heparin lock flush 100 unit/mL  250 Units Intravenous Once Cammie Sickle, MD        PHYSICAL EXAMINATION: ECOG PERFORMANCE STATUS: 1 - Symptomatic but completely ambulatory  Vitals:   06/09/19 0912  BP: (!) 155/88  Pulse: 72  Resp: 16  Temp: 98.1 F (36.7 C)  SpO2: 98%   Filed Weights   06/09/19 0912  Weight: 98 lb 6.4 oz (44.6 kg)    Physical Exam  Constitutional: She is oriented to person, place, and time and well-developed, well-nourished, and in no distress.  PICC line in place.  No active bleeding.  Masked.  In a wheelchair.  Accompanied by husband.  HENT:  Head: Normocephalic and atraumatic.  Mouth/Throat: Oropharynx is clear and moist. No oropharyngeal exudate.  Ecchymosis noted in the buccal mucosa.  Eyes: Conjunctivae are normal. No scleral icterus.  Cardiovascular: Normal rate and regular rhythm.  Pulmonary/Chest: Effort normal. No respiratory distress.  Abdominal: Soft. Bowel sounds are normal. She exhibits no distension and no mass. There is no abdominal tenderness. There is no rebound and no guarding.  Musculoskeletal:        General: No tenderness or edema. Normal range of motion.     Cervical back: Normal range of motion and neck supple.     Comments: Sitting in wheelchair  Neurological: She is alert and oriented to person, place, and time.  Skin: Skin is warm.  Multiple bruises on lower legs, few on hands and arms. Torso bruising improved/mostly resolved  Psychiatric: Affect normal.    LABORATORY DATA:  I have reviewed the data as listed Lab Results  Component Value Date   WBC 1.9 (L) 06/09/2019   HGB 7.5 (L) 06/09/2019   HCT 23.9 (L) 06/09/2019   MCV 86.3 06/09/2019   PLT 8 (LL) 06/09/2019   Recent Labs    06/02/19 0808 06/04/19 0812  06/09/19 0844  NA 133* 134* 137  K 3.6 3.8 3.5  CL 99 98 100  CO2 '25 24 27  '$ GLUCOSE 181* 168* 166*  BUN 33* 33* 26*  CREATININE 0.95 0.92 0.86  CALCIUM 9.0 8.9 9.1  GFRNONAA 55* 58* >60  GFRAA >60 >60 >60  PROT 6.2* 6.0* 6.1*  ALBUMIN 3.6 3.6 3.4*  AST 58* 62* 55*  ALT '30 29 29  '$ ALKPHOS 67 70 73  BILITOT 0.5 0.6 0.4    RADIOGRAPHIC STUDIES: I have personally reviewed the radiological images as listed and agreed with the findings in the report. No results found.  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  NGS-FLT3 POSITIVE.   # Currently on gilteritinib-'80mg'$  a day; Also Promacta x6 weeks-no significant clinical/hematologic response noted.  Concerning for progression.  Recommend a bone marrow biopsy for further evaluation.  If patient has continued disease burden on bone marrow, I would recommend- vidaza/ venatoclax.  Continue gilteritinib/Promacta for now.  # Leucopenia/ anemia/thrombocytopenia-White count 2.2; ANC- 0.3; /hemoglobin 7.0 platelets8:  secondary to underlying acute myeloid leukemia/Gilteritinib.   Proceed with platelet transfusion today/PRBC as needed.  #Supportive care for AML-  -Continue Noxafil/posaconazole '100mg'$  and acyclovir 400 mg, and levaquin 250 mg for prophylaxis. Continue PICC line dressing changes weekly  #Spoke to patient husband regarding plan of care.  In agreement.  Also discussed with Dr.Rizzzeri from [place].  Disposition: # 1 unit platelets today; #1 unit PRBC tomorrow # 12/31- labs- hold tube'cbc; possible 1 unit platelets/1 unit PRBC transfusion- # Follow up- Jan 4th MD- labs-cbc/bmp;LDH; hold tube;posible transfusion; likely admission to hospital- Dr.B

## 2019-06-10 ENCOUNTER — Ambulatory Visit
Admission: RE | Admit: 2019-06-10 | Discharge: 2019-06-10 | Disposition: A | Discharge status: Home / Self Care | Payer: Medicare Other | Source: Ambulatory Visit | Attending: Internal Medicine | Admitting: Internal Medicine

## 2019-06-10 ENCOUNTER — Telehealth: Payer: Self-pay | Admitting: *Deleted

## 2019-06-10 DIAGNOSIS — C9200 Acute myeloblastic leukemia, not having achieved remission: Secondary | ICD-10-CM | POA: Diagnosis present

## 2019-06-10 DIAGNOSIS — C9202 Acute myeloblastic leukemia, in relapse: Secondary | ICD-10-CM | POA: Diagnosis not present

## 2019-06-10 LAB — BPAM PLATELET PHERESIS
Blood Product Expiration Date: 202012292359
ISSUE DATE / TIME: 202012281046
Unit Type and Rh: 5100

## 2019-06-10 LAB — PREPARE RBC (CROSSMATCH)

## 2019-06-10 LAB — PREPARE PLATELET PHERESIS: Unit division: 0

## 2019-06-10 MED ORDER — SODIUM CHLORIDE 0.9% IV SOLUTION: 250.0000 mL | Freq: Once | INTRAVENOUS | Status: DC

## 2019-06-10 MED ORDER — DIPHENHYDRAMINE HCL 25 MG PO CAPS: 25.0000 mg | ORAL_CAPSULE | Freq: Once | ORAL | Status: DC

## 2019-06-10 MED ORDER — ACETAMINOPHEN 325 MG PO TABS
ORAL_TABLET | ORAL | Status: AC
  Filled 2019-06-10: qty 2

## 2019-06-10 MED ORDER — HEPARIN SOD (PORK) LOCK FLUSH 100 UNIT/ML IV SOLN: 250.0000 [IU] | INTRAVENOUS | Status: AC | PRN

## 2019-06-10 MED ORDER — ACETAMINOPHEN 325 MG PO TABS
650.0000 mg | ORAL_TABLET | Freq: Once | ORAL | Status: AC
  Administered 2019-06-10: 650 mg via ORAL

## 2019-06-10 MED ORDER — HEPARIN SOD (PORK) LOCK FLUSH 100 UNIT/ML IV SOLN
INTRAVENOUS | Status: AC
  Administered 2019-06-10: 250 [IU]
  Filled 2019-06-10: qty 5

## 2019-06-10 NOTE — Telephone Encounter (Signed)
Per v/o Dr. Rogue Bussing - faxed sch. Worksheet to specialty scheduling to sch. Bone marrow biopsy asap.

## 2019-06-10 NOTE — Telephone Encounter (Signed)
Spoke with patient re: bone marrow biopsy date/time/procedure.  Apt 06/16/19 with arrival 8 am. NPO 6-8 hours prior other than routine bp/heart medications with sip of water only. Pt and pt's husband gave verbal understanding. Patient gave verbal understandig (per v/o Dr. Jacinto Reap) to stop promacta and gilterinib on Jan. 1, 2021. Explained to husband that the purpose of another bone marrow biopsy is to determine the AML disease burden. Husband gave verbal understanding of the plan of care.

## 2019-06-11 ENCOUNTER — Inpatient Hospital Stay: Payer: Medicare Other

## 2019-06-11 ENCOUNTER — Other Ambulatory Visit: Payer: Self-pay

## 2019-06-11 LAB — TYPE AND SCREEN
ABO/RH(D): O POS
Antibody Screen: NEGATIVE
Unit division: 0

## 2019-06-11 LAB — BPAM RBC
Blood Product Expiration Date: 202101162359
ISSUE DATE / TIME: 202012290828
Unit Type and Rh: 5100

## 2019-06-11 NOTE — Progress Notes (Signed)
Spoke with patient and husband today in regards patient is on schedule for BMB on 06/17/2019, instructions given and aware to be here at 0800, and NPO after MN prior to procedure, stated understanding

## 2019-06-12 ENCOUNTER — Inpatient Hospital Stay: Payer: Medicare Other

## 2019-06-12 ENCOUNTER — Other Ambulatory Visit: Payer: Self-pay | Admitting: *Deleted

## 2019-06-12 ENCOUNTER — Inpatient Hospital Stay: Payer: Medicare Other | Admitting: *Deleted

## 2019-06-12 ENCOUNTER — Other Ambulatory Visit: Payer: Self-pay

## 2019-06-12 DIAGNOSIS — C9200 Acute myeloblastic leukemia, not having achieved remission: Secondary | ICD-10-CM

## 2019-06-12 DIAGNOSIS — Z452 Encounter for adjustment and management of vascular access device: Secondary | ICD-10-CM

## 2019-06-12 DIAGNOSIS — C9202 Acute myeloblastic leukemia, in relapse: Secondary | ICD-10-CM | POA: Diagnosis not present

## 2019-06-12 LAB — CBC WITH DIFFERENTIAL/PLATELET
Abs Immature Granulocytes: 0 10*3/uL (ref 0.00–0.07)
Band Neutrophils: 1 %
Basophils Absolute: 0 10*3/uL (ref 0.0–0.1)
Basophils Relative: 0 %
Blasts: 13 %
Eosinophils Absolute: 0 10*3/uL (ref 0.0–0.5)
Eosinophils Relative: 1 %
HCT: 26.2 % — ABNORMAL LOW (ref 36.0–46.0)
Hemoglobin: 8.3 g/dL — ABNORMAL LOW (ref 12.0–15.0)
Lymphocytes Relative: 33 %
Lymphs Abs: 0.7 10*3/uL (ref 0.7–4.0)
MCH: 27.5 pg (ref 26.0–34.0)
MCHC: 31.7 g/dL (ref 30.0–36.0)
MCV: 86.8 fL (ref 80.0–100.0)
Metamyelocytes Relative: 1 %
Monocytes Absolute: 0.7 10*3/uL (ref 0.1–1.0)
Monocytes Relative: 33 %
Myelocytes: 1 %
Neutro Abs: 0.4 10*3/uL — ABNORMAL LOW (ref 1.7–7.7)
Neutrophils Relative %: 17 %
Platelets: 9 10*3/uL — CL (ref 150–400)
RBC: 3.02 MIL/uL — ABNORMAL LOW (ref 3.87–5.11)
RDW: 18.3 % — ABNORMAL HIGH (ref 11.5–15.5)
Smear Review: NORMAL
WBC: 2.1 10*3/uL — ABNORMAL LOW (ref 4.0–10.5)
nRBC: 6.8 % — ABNORMAL HIGH (ref 0.0–0.2)

## 2019-06-12 LAB — SAMPLE TO BLOOD BANK

## 2019-06-12 MED ORDER — SODIUM CHLORIDE 0.9% FLUSH
10.0000 mL | Freq: Once | INTRAVENOUS | Status: AC
  Administered 2019-06-12: 09:00:00 10 mL via INTRAVENOUS
  Filled 2019-06-12: qty 10

## 2019-06-12 MED ORDER — SODIUM CHLORIDE 0.9% IV SOLUTION
250.0000 mL | Freq: Once | INTRAVENOUS | Status: AC
  Administered 2019-06-12: 10:00:00 250 mL via INTRAVENOUS
  Filled 2019-06-12: qty 250

## 2019-06-12 MED ORDER — DIPHENHYDRAMINE HCL 25 MG PO CAPS: 25.0000 mg | ORAL_CAPSULE | Freq: Once | ORAL | Status: DC

## 2019-06-12 MED ORDER — HEPARIN SOD (PORK) LOCK FLUSH 100 UNIT/ML IV SOLN
INTRAVENOUS | Status: AC
  Filled 2019-06-12: qty 5

## 2019-06-12 MED ORDER — ACETAMINOPHEN 325 MG PO TABS
650.0000 mg | ORAL_TABLET | Freq: Once | ORAL | Status: AC
  Administered 2019-06-12: 10:00:00 650 mg via ORAL
  Filled 2019-06-12: qty 2

## 2019-06-12 MED ORDER — HEPARIN SOD (PORK) LOCK FLUSH 100 UNIT/ML IV SOLN
500.0000 [IU] | Freq: Once | INTRAVENOUS | Status: AC
  Administered 2019-06-12: 12:00:00 500 [IU] via INTRAVENOUS
  Filled 2019-06-12: qty 5

## 2019-06-13 LAB — PREPARE PLATELET PHERESIS: Unit division: 0

## 2019-06-13 LAB — BPAM PLATELET PHERESIS
Blood Product Expiration Date: 202101022359
ISSUE DATE / TIME: 202012311125
Unit Type and Rh: 2800

## 2019-06-16 ENCOUNTER — Inpatient Hospital Stay: Payer: Medicare Other | Attending: Internal Medicine

## 2019-06-16 ENCOUNTER — Inpatient Hospital Stay (HOSPITAL_BASED_OUTPATIENT_CLINIC_OR_DEPARTMENT_OTHER): Payer: Medicare Other | Admitting: Internal Medicine

## 2019-06-16 ENCOUNTER — Inpatient Hospital Stay: Payer: Medicare Other

## 2019-06-16 ENCOUNTER — Other Ambulatory Visit: Payer: Self-pay

## 2019-06-16 ENCOUNTER — Other Ambulatory Visit: Payer: Self-pay | Admitting: Radiology

## 2019-06-16 ENCOUNTER — Other Ambulatory Visit: Payer: Self-pay | Admitting: *Deleted

## 2019-06-16 DIAGNOSIS — Z79899 Other long term (current) drug therapy: Secondary | ICD-10-CM | POA: Insufficient documentation

## 2019-06-16 DIAGNOSIS — C9200 Acute myeloblastic leukemia, not having achieved remission: Secondary | ICD-10-CM

## 2019-06-16 DIAGNOSIS — Z452 Encounter for adjustment and management of vascular access device: Secondary | ICD-10-CM

## 2019-06-16 LAB — CBC WITH DIFFERENTIAL/PLATELET
Abs Immature Granulocytes: 0 10*3/uL (ref 0.00–0.07)
Basophils Absolute: 0 10*3/uL (ref 0.0–0.1)
Basophils Relative: 0 %
Blasts: 17 %
Eosinophils Absolute: 0 10*3/uL (ref 0.0–0.5)
Eosinophils Relative: 0 %
HCT: 25.7 % — ABNORMAL LOW (ref 36.0–46.0)
Hemoglobin: 8 g/dL — ABNORMAL LOW (ref 12.0–15.0)
Lymphocytes Relative: 31 %
Lymphs Abs: 0.7 10*3/uL (ref 0.7–4.0)
MCH: 27.1 pg (ref 26.0–34.0)
MCHC: 31.1 g/dL (ref 30.0–36.0)
MCV: 87.1 fL (ref 80.0–100.0)
Monocytes Absolute: 0.8 10*3/uL (ref 0.1–1.0)
Monocytes Relative: 36 %
Neutro Abs: 0.4 10*3/uL — ABNORMAL LOW (ref 1.7–7.7)
Neutrophils Relative %: 16 %
Platelets: 5 10*3/uL — CL (ref 150–400)
RBC: 2.95 MIL/uL — ABNORMAL LOW (ref 3.87–5.11)
RDW: 17.8 % — ABNORMAL HIGH (ref 11.5–15.5)
Smear Review: DECREASED
WBC: 2.2 10*3/uL — ABNORMAL LOW (ref 4.0–10.5)
nRBC: 2.7 % — ABNORMAL HIGH (ref 0.0–0.2)

## 2019-06-16 LAB — BASIC METABOLIC PANEL
Anion gap: 11 (ref 5–15)
BUN: 31 mg/dL — ABNORMAL HIGH (ref 8–23)
CO2: 25 mmol/L (ref 22–32)
Calcium: 9 mg/dL (ref 8.9–10.3)
Chloride: 101 mmol/L (ref 98–111)
Creatinine, Ser: 0.85 mg/dL (ref 0.44–1.00)
GFR calc Af Amer: >60 mL/min (ref >60)
GFR calc non Af Amer: >60 mL/min (ref >60)
Glucose, Bld: 135 mg/dL — ABNORMAL HIGH (ref 70–99)
Potassium: 4 mmol/L (ref 3.5–5.1)
Sodium: 137 mmol/L (ref 135–145)

## 2019-06-16 LAB — SAMPLE TO BLOOD BANK

## 2019-06-16 LAB — LACTATE DEHYDROGENASE: LDH: 1549 U/L — ABNORMAL HIGH (ref 98–192)

## 2019-06-16 MED ORDER — HEPARIN SOD (PORK) LOCK FLUSH 100 UNIT/ML IV SOLN
500.0000 [IU] | Freq: Once | INTRAVENOUS | Status: AC
  Administered 2019-06-16: 250 [IU] via INTRAVENOUS
  Filled 2019-06-16: qty 5

## 2019-06-16 MED ORDER — SODIUM CHLORIDE 0.9% FLUSH
10.0000 mL | Freq: Once | INTRAVENOUS | Status: AC
  Administered 2019-06-16: 10 mL via INTRAVENOUS
  Filled 2019-06-16: qty 10

## 2019-06-16 MED ORDER — SODIUM CHLORIDE 0.9% IV SOLUTION
250.0000 mL | Freq: Once | INTRAVENOUS | Status: AC
  Administered 2019-06-16: 250 mL via INTRAVENOUS
  Filled 2019-06-16: qty 250

## 2019-06-16 MED ORDER — HEPARIN SOD (PORK) LOCK FLUSH 100 UNIT/ML IV SOLN
INTRAVENOUS | Status: AC
  Filled 2019-06-16: qty 5

## 2019-06-16 MED ORDER — ACETAMINOPHEN 325 MG PO TABS
650.0000 mg | ORAL_TABLET | Freq: Once | ORAL | Status: AC
  Administered 2019-06-16: 650 mg via ORAL
  Filled 2019-06-16: qty 2

## 2019-06-16 MED ORDER — DIPHENHYDRAMINE HCL 25 MG PO CAPS
25.0000 mg | ORAL_CAPSULE | Freq: Once | ORAL | Status: DC
  Filled 2019-06-16: qty 1

## 2019-06-16 NOTE — Assessment & Plan Note (Addendum)
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  NGS-FLT3 POSITIVE.   #Most recently on gilteritinib-'80mg'$  a day; Also Promacta x 6 weeks; .  No significant clinical/hematologic response noted. gilteritinib/Promacta stopped on 12/31. Clinically, concerning for progression; awaiting bone marrow biopsy on 12/05.  If patient has continued disease burden on bone marrow, I would recommend- vidaza/ venatoclax.   # Leucopenia/ anemia/thrombocytopenia-White count 2.2; ANC- 0.3; /hemoglobin 8.0; platelets5:  secondary to underlying acute myeloid leukemia/Gilteritinib.  Proceed with platelet transfusion today/PRBC as needed.  #Patient awaiting Covid vaccination-on 1/05; discussed that at risk for hematomas from IM injection given thrombocytopenia.  Recommend ice pack; also 2 units of platelets today.  #Supportive care for AML-  -Continue Noxafil/posaconazole '100mg'$  and acyclovir 400 mg, and levaquin 250 mg for prophylaxis. Continue PICC line dressing changes weekly  #I spoke to patient husband regarding plan of care.  In agreement.  Also discussed with Dr.Rizzzeri from [place].  Disposition: # 2 unit platelets today; NO PRBC.  # 1/06 or 1/07- labs- hold tube'cbc; possible 1 unit platelets/1 unit PRBC transfusion- # Follow up- Jan 11th MD- labs-cbc/cmp;LDH;uric acid; phos; mag hold tube;posible transfusion; likely admission to hospital- Dr.B

## 2019-06-16 NOTE — Progress Notes (Signed)
[facility] OFFICE PROGRESS NOTE  Patient Care Team: Rusty Aus, MD as PCP - General (Internal Medicine)  CHIEF COMPLAINTS/PURPOSE OF CONSULTATION: Acute myeloid leukemia   Oncology History Overview Note  # SEP 2017- MYELOPROLIFERATIVE NEOPLASM- [WBC- 74; normal Hb/platelets] hypercellular bone marrow 90-95% proliferation of myeloid cells in various stages of maturation; relative erythroid hypoplasia and proliferation of atypical megakaryocytes; no increase in blasts; peripheral blood Bcr-Abl-NEG; Cytogenetics- WNL. NEG- Jak-2/MPL/CALR [place] limited- mild splenomegaly [~10cm; 463cm3]; OCT 2017- second opinion at [facility]. Surveillance.   # With progressive leukocytosis we evaluated her a month ago and sent BM for exam. She has a progressive leukocytosis so hydrea '500mg'$  daily was started on 10/20/2016 and increased to 2gm daily then back down to one daily, then 5 days per week over time due to counts drop. Then we held her hydrea since 04/09/2018 due to continued declining Hb and Plt.s  BMB 06/24/18 showed markedly hypercellular BM 95% with myeloid hyperplasia and atypical megakaryocytic hyperplasia. No significant increase in blasts. 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 BCR / ABL negative, FISH normal. Flow Showed 2%CD34-positive myeloid blasts. Myeloid precursors with low side scatter. Pathogenic variants were detected in the ASXL1, CCND2, CUX1, and U2AF1 genes.  # March 2020- wbc- 14/ Hb 9.5/ platelets- 54.   ---------------------------------------------------   # June 8th 2020- Acute myeloid leukemia [peripheral blood flow cytometry;NGS- pending]- June 15th Vidaza [SQ 1-5 + Venatoclax- #1in pt]; tumor lysis prophylaxis  # Day #28 bone marrow biopsy-  7/14- BMx- NO BLASTS; positive for dysplastic changes. OFF Venatoclax [since 7/17] sec to cytopenias.  #817 cycle #2 Vidaza+ venetoclax 100 mg; stop venetoclax on  8/24 [severe pancytopenia]   # 02/18/2019-start gilteritinib 80 mg a day; HELD on 9/14 [sec to severe pan]  # NOV 9th/2020-BMBx- RELAPSED AML; NOV 11th- STARTED GILTERITINIB '40mg'$ /day; NOV17th 2020- PROMACTA '50mg'$ /day; NOV 30th- inrease Gilt to 80 mg/day; STOP Gilt/ Promatca [no reponse 12/30-   # AUG 2020- AST elevation- 248-?sec to Posonoazole; RESOLVED; continue at lower dose 100 mg/day.   # 2002 [[place]] BREAST CA s/p Lumpect RT; [? Stage I] no chemo s/p AI  # F-ONE HEM: FLT3 ALTERATION NOTED  DIAGNOSIS: ACUTE MYELOID LEUKEMIA GOALS: pallaitive  CURRENT/MOST RECENT THERAPY : Gilteritinib     MDS (myelodysplastic syndrome) ([place]) (Resolved)  08/28/2018 Initial Diagnosis   MDS (myelodysplastic syndrome) ([place])   Acute myeloid leukemia not having achieved remission ([place])  11/25/2018 Initial Diagnosis   AML (acute myeloid leukemia) with failed remission ([place])   11/25/2018 - 12/22/2018 Chemotherapy   The patient had azaCITIDine (VIDAZA) chemo injection 105 mg, 75 mg/m2 = 105 mg, Subcutaneous,  Once, 1 of 4 cycles Administration: 105 mg (11/25/2018), 105 mg (11/26/2018), 105 mg (11/27/2018), 105 mg (11/28/2018), 105 mg (11/29/2018)  for chemotherapy treatment.    01/27/2019 -  Chemotherapy   The patient had palonosetron (ALOXI) injection 0.25 mg, 0.25 mg, Intravenous,  Once, 1 of 4 cycles Administration: 0.25 mg (01/27/2019), 0.25 mg (01/29/2019), 0.25 mg (01/31/2019) azaCITIDine (VIDAZA) 100 mg in sodium chloride 0.9 % 50 mL chemo infusion, 75 mg/m2 = 100 mg, Intravenous, Once, 1 of 4 cycles Administration: 100 mg (01/27/2019), 100 mg (01/28/2019), 100 mg (01/29/2019), 100 mg (01/30/2019), 100 mg (01/31/2019)  for chemotherapy treatment.      HISTORY OF PRESENTING ILLNESS:  Kathryn Hahn 86 y.o.  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  Patient stopped  gilteritinib/Promacta on December 31st 2020.   Patient continues to complain of easy bruising; blood blisters in the mouth.  No fever no chills.  No nausea vomiting.  No diarrhea.  Fatigue.  Review of Systems  Constitutional: Positive for malaise/fatigue. Negative for chills, diaphoresis, fever and weight loss.  HENT: Negative for nosebleeds and sore throat.   Eyes: Negative for double vision.  Respiratory: Negative for cough, hemoptysis, sputum production and wheezing.   Cardiovascular: Negative for chest pain, palpitations and orthopnea.  Gastrointestinal: Negative for abdominal pain, blood in stool, constipation, diarrhea, heartburn, melena, nausea and vomiting.  Genitourinary: Negative for dysuria, frequency and urgency.  Musculoskeletal: Positive for back pain. Negative for joint pain.  Skin: Negative.  Negative for itching and rash.  Neurological: Negative for dizziness, tingling, focal weakness, weakness and headaches.  Endo/Heme/Allergies: Bruises/bleeds easily.  Psychiatric/Behavioral: Negative for depression. The patient is not nervous/anxious and does not have insomnia.     MEDICAL HISTORY:  Past Medical History:  Diagnosis Date  . Anemia   . Arthritis   . Breast cancer ([place]) 2003   RT LUMPECTOMY  . Edema leg   . History of breast cancer 2003   post lumpectomy  . History of kidney stones   . Hyperlipemia, mixed   . Hypertension, essential   . Leukemia ([place])   . Leukocytosis   . MDS (myelodysplastic syndrome) ([place])   . Personal history of radiation therapy 2003   BREAST CA  . Renal stones   . Vitamin D deficiency     SURGICAL HISTORY: Past Surgical History:  Procedure Laterality Date  . BREAST BIOPSY Right 2003   Postive for cancer  . BREAST LUMPECTOMY Right 2003   BREAST CA  . KIDNEY STONE SURGERY Right     SOCIAL HISTORY: Social History   Socioeconomic History  . Marital status: Married    Spouse name: Not on file  . Number of children: Not on  file  . Years of education: Not on file  . Highest education level: Not on file  Occupational History  . Not on file  Tobacco Use  . Smoking status: Never Smoker  . Smokeless tobacco: Never Used  Substance and Sexual Activity  . Alcohol use: Yes    Alcohol/week: 3.0 standard drinks    Types: 3 Glasses of wine per week  . Drug use: No  . Sexual activity: Not on file  Other Topics Concern  . Not on file  Social History Narrative    No smoking/alcohol; lives in [place] with family.  She lives in assisted living with her husband.   Social Determinants of Health   Financial Resource Strain:   . Difficulty of Paying Living Expenses: Not on file  Food Insecurity:   . Worried About Charity fundraiser in the Last Year: Not on file  . Ran Out of Food in the Last Year: Not on file  Transportation Needs:   . Lack of Transportation (Medical): Not on file  . Lack of Transportation (Non-Medical): Not on file  Physical Activity:   . Days of Exercise per Week: Not on file  . Minutes of Exercise per Session: Not on file  Stress:   . Feeling of Stress : Not on file  Social Connections:   . Frequency of Communication with Friends and Family: Not on file  . Frequency of Social Gatherings with Friends and Family: Not on file  . Attends Religious Services: Not on file  . Active Member  of Clubs or Organizations: Not on file  . Attends Archivist Meetings: Not on file  . Marital Status: Not on file  Intimate Partner Violence:   . Fear of Current or Ex-Partner: Not on file  . Emotionally Abused: Not on file  . Physically Abused: Not on file  . Sexually Abused: Not on file    FAMILY HISTORY: Family History  Problem Relation Age of Onset  . Stroke Mother   . Hypertension Father   . Stroke Father   . Breast cancer Neg Hx     ALLERGIES:  is allergic to terbinafine.  MEDICATIONS:  Current Outpatient Medications  Medication Sig Dispense Refill  . acetaminophen (TYLENOL)  325 MG tablet Take 325 mg by mouth every 6 (six) hours as needed for mild pain or moderate pain.     Marland Kitchen acyclovir (ZOVIRAX) 400 MG tablet ONE PILL A DAY [TO PREVENT SHINGLES] 90 tablet 1  . atenolol (TENORMIN) 50 MG tablet Take 1 tablet by mouth 2 (two) times daily.    . calcium carbonate (OSCAL) 1500 (600 Ca) MG TABS tablet Take 600 mg of elemental calcium by mouth daily with breakfast.    . Cholecalciferol (VITAMIN D) 2000 units tablet Take 1 tablet by mouth daily.    . heparin lock flush 100 UNIT/ML SOLN injection Inject into the vein.    . Infant Care Products (DERMACLOUD) CREA Apply topically.    Marland Kitchen levofloxacin (LEVAQUIN) 250 MG tablet Take 1 tablet (250 mg total) by mouth daily. 30 tablet 3  . losartan-hydrochlorothiazide (HYZAAR) 50-12.5 MG tablet Take 1 tablet by mouth daily.    . magic mouthwash w/lidocaine SOLN Take 5 mLs by mouth 4 (four) times daily as needed for mouth pain. 480 mL 3  . magnesium hydroxide (MILK OF MAGNESIA) 400 MG/5ML suspension Take 5 mLs by mouth every 4 (four) hours as needed.    . ondansetron (ZOFRAN) 8 MG tablet Take by mouth every 8 (eight) hours as needed for nausea or vomiting.    . polyethylene glycol (MIRALAX / GLYCOLAX) 17 g packet Take 17 g by mouth daily.    . posaconazole (NOXAFIL) 100 MG TBEC delayed-release tablet Take 1 tablet (100 mg total) by mouth daily. 30 tablet 3  . Potassium Chloride ER 20 MEQ TBCR Take 20 mEq by mouth daily. 60 tablet 1  . potassium chloride SA (KLOR-CON M20) 20 MEQ tablet Take 1 tablet (20 mEq total) by mouth 2 (two) times daily. 60 tablet 2  . senna (SENOKOT) 8.6 MG tablet Take 2 tablets by mouth 2 (two) times daily.    Marland Kitchen eltrombopag (PROMACTA) 50 MG tablet Take 1 tablet (50 mg total) by mouth daily. Take on an empty stomach 1 hour before a meal or 2 hours after (Patient not taking: Reported on 06/16/2019) 30 tablet 3  . Gilteritinib Fumarate (XOSPATA) 40 MG TABS Take 80 mg by mouth daily. (Patient not taking: Reported on  06/16/2019) 60 tablet 2   No current facility-administered medications for this visit.   Facility-Administered Medications Ordered in Other Visits  Medication Dose Route Frequency Provider Last Rate Last Admin  . 0.9 %  sodium chloride infusion   Intravenous Continuous Jacquelin Hawking, NP   Stopped at 02/05/19 1630  . diphenhydrAMINE (BENADRYL) capsule 25 mg  25 mg Oral Once Charlaine Dalton R, MD      . heparin lock flush 100 unit/mL  250 Units Intravenous Once Cammie Sickle, MD  PHYSICAL EXAMINATION: ECOG PERFORMANCE STATUS: 1 - Symptomatic but completely ambulatory  Vitals:   06/16/19 0841  BP: (!) 155/101  Pulse: 68  Temp: 97.8 F (36.6 C)   Filed Weights   06/16/19 0841  Weight: 97 lb 12.8 oz (44.4 kg)    Physical Exam  Constitutional: She is oriented to person, place, and time and well-developed, well-nourished, and in no distress.  PICC line in place.  No active bleeding.  Masked.  Walking herself.  Accompanied by husband.  HENT:  Head: Normocephalic and atraumatic.  Mouth/Throat: Oropharynx is clear and moist. No oropharyngeal exudate.  Ecchymosis noted in the buccal mucosa.  Eyes: Conjunctivae are normal. No scleral icterus.  Cardiovascular: Normal rate and regular rhythm.  Pulmonary/Chest: Effort normal. No respiratory distress.  Abdominal: Soft. Bowel sounds are normal. She exhibits no distension and no mass. There is no abdominal tenderness. There is no rebound and no guarding.  Musculoskeletal:        General: No tenderness or edema. Normal range of motion.     Cervical back: Normal range of motion and neck supple.  Neurological: She is alert and oriented to person, place, and time.  Skin: Skin is warm.  Multiple bruises on lower legs, few on hands and arms. Torso bruising improved/mostly resolved  Psychiatric: Affect normal.    LABORATORY DATA:  I have reviewed the data as listed Lab Results  Component Value Date   WBC 2.2 (L)  06/16/2019   HGB 8.0 (L) 06/16/2019   HCT 25.7 (L) 06/16/2019   MCV 87.1 06/16/2019   PLT 5 (LL) 06/16/2019   Recent Labs    06/02/19 0808 06/04/19 0812 06/09/19 0844 06/16/19 0830  NA 133* 134* 137 137  K 3.6 3.8 3.5 4.0  CL 99 98 100 101  CO2 '25 24 27 25  '$ GLUCOSE 181* 168* 166* 135*  BUN 33* 33* 26* 31*  CREATININE 0.95 0.92 0.86 0.85  CALCIUM 9.0 8.9 9.1 9.0  GFRNONAA 55* 58* >60 >60  GFRAA >60 >60 >60 >60  PROT 6.2* 6.0* 6.1*  --   ALBUMIN 3.6 3.6 3.4*  --   AST 58* 62* 55*  --   ALT '30 29 29  '$ --   ALKPHOS 67 70 73  --   BILITOT 0.5 0.6 0.4  --     RADIOGRAPHIC STUDIES: I have personally reviewed the radiological images as listed and agreed with the findings in the report. No results found.  ASSESSMENT & PLAN:   Acute myeloid leukemia not having achieved remission ([place]) # Relapsed acute myeloid leukemia with monocytic differentiation-bone marrow biopsy, April 22, 2019-relapse 50 to 60% involvement.  NGS-FLT3 POSITIVE.   #Most recently on gilteritinib-'80mg'$  a day; Also Promacta x 6 weeks; .  No significant clinical/hematologic response noted. gilteritinib/Promacta stopped on 12/31. Clinically, concerning for progression; awaiting bone marrow biopsy on 12/05.  If patient has continued disease burden on bone marrow, I would recommend- vidaza/ venatoclax.   # Leucopenia/ anemia/thrombocytopenia-White count 2.2; ANC- 0.3; /hemoglobin 8.0; platelets5:  secondary to underlying acute myeloid leukemia/Gilteritinib.  Proceed with platelet transfusion today/PRBC as needed.  #Patient awaiting Covid vaccination-on 1/05; discussed that at risk for hematomas from IM injection given thrombocytopenia.  Recommend ice pack; also 2 units of platelets today.  #Supportive care for AML-  -Continue Noxafil/posaconazole '100mg'$  and acyclovir 400 mg, and levaquin 250 mg for prophylaxis. Continue PICC line dressing changes weekly  #I spoke to patient husband regarding plan of care.  In  agreement.  Also  discussed with Dr.Rizzzeri from [place].  Disposition: # 2 unit platelets today; NO PRBC.  # 1/06 or 1/07- labs- hold tube'cbc; possible 1 unit platelets/1 unit PRBC transfusion- # Follow up- Jan 11th MD- labs-cbc/cmp;LDH;uric acid; phos; mag hold tube;posible transfusion; likely admission to hospital- Dr.B

## 2019-06-16 NOTE — Progress Notes (Signed)
Pt received two units of platelets per MD order. No further orders at this time. Pt and VS stable at discharge.

## 2019-06-17 ENCOUNTER — Ambulatory Visit
Admission: RE | Admit: 2019-06-17 | Discharge: 2019-06-17 | Disposition: A | Discharge status: Home / Self Care | Payer: Medicare Other | Source: Ambulatory Visit | Attending: Internal Medicine | Admitting: Internal Medicine

## 2019-06-17 ENCOUNTER — Other Ambulatory Visit: Payer: Self-pay

## 2019-06-17 DIAGNOSIS — M1990 Unspecified osteoarthritis, unspecified site: Secondary | ICD-10-CM | POA: Insufficient documentation

## 2019-06-17 DIAGNOSIS — C9200 Acute myeloblastic leukemia, not having achieved remission: Secondary | ICD-10-CM | POA: Insufficient documentation

## 2019-06-17 DIAGNOSIS — Z79899 Other long term (current) drug therapy: Secondary | ICD-10-CM | POA: Insufficient documentation

## 2019-06-17 DIAGNOSIS — D61818 Other pancytopenia: Secondary | ICD-10-CM | POA: Insufficient documentation

## 2019-06-17 DIAGNOSIS — Z888 Allergy status to other drugs, medicaments and biological substances status: Secondary | ICD-10-CM | POA: Diagnosis not present

## 2019-06-17 DIAGNOSIS — Z853 Personal history of malignant neoplasm of breast: Secondary | ICD-10-CM | POA: Insufficient documentation

## 2019-06-17 DIAGNOSIS — E559 Vitamin D deficiency, unspecified: Secondary | ICD-10-CM | POA: Diagnosis not present

## 2019-06-17 DIAGNOSIS — Z923 Personal history of irradiation: Secondary | ICD-10-CM | POA: Insufficient documentation

## 2019-06-17 DIAGNOSIS — Z87442 Personal history of urinary calculi: Secondary | ICD-10-CM | POA: Diagnosis not present

## 2019-06-17 DIAGNOSIS — Z823 Family history of stroke: Secondary | ICD-10-CM | POA: Insufficient documentation

## 2019-06-17 DIAGNOSIS — Z8249 Family history of ischemic heart disease and other diseases of the circulatory system: Secondary | ICD-10-CM | POA: Diagnosis not present

## 2019-06-17 DIAGNOSIS — E782 Mixed hyperlipidemia: Secondary | ICD-10-CM | POA: Insufficient documentation

## 2019-06-17 LAB — CBC WITH DIFFERENTIAL/PLATELET
Abs Immature Granulocytes: 0.3 10*3/uL — ABNORMAL HIGH (ref 0.00–0.07)
Band Neutrophils: 6 %
Basophils Absolute: 0 10*3/uL (ref 0.0–0.1)
Basophils Relative: 0 %
Blasts: 16 %
Eosinophils Absolute: 0 10*3/uL (ref 0.0–0.5)
Eosinophils Relative: 0 %
HCT: 27.2 % — ABNORMAL LOW (ref 36.0–46.0)
Hemoglobin: 8.5 g/dL — ABNORMAL LOW (ref 12.0–15.0)
Lymphocytes Relative: 28 %
Lymphs Abs: 0.7 10*3/uL (ref 0.7–4.0)
MCH: 26.9 pg (ref 26.0–34.0)
MCHC: 31.3 g/dL (ref 30.0–36.0)
MCV: 86.1 fL (ref 80.0–100.0)
Metamyelocytes Relative: 6 %
Monocytes Absolute: 0.8 10*3/uL (ref 0.1–1.0)
Monocytes Relative: 32 %
Myelocytes: 4 %
Neutro Abs: 0.4 10*3/uL — ABNORMAL LOW (ref 1.7–7.7)
Neutrophils Relative %: 8 %
Platelets: 33 10*3/uL — ABNORMAL LOW (ref 150–400)
RBC: 3.16 MIL/uL — ABNORMAL LOW (ref 3.87–5.11)
RDW: 17.9 % — ABNORMAL HIGH (ref 11.5–15.5)
WBC: 2.5 10*3/uL — ABNORMAL LOW (ref 4.0–10.5)
nRBC: 2.8 % — ABNORMAL HIGH (ref 0.0–0.2)

## 2019-06-17 LAB — PREPARE PLATELET PHERESIS
Unit division: 0
Unit division: 0

## 2019-06-17 LAB — PROTIME-INR
INR: 1.1 (ref 0.8–1.2)
Prothrombin Time: 13.7 seconds (ref 11.4–15.2)

## 2019-06-17 LAB — BPAM PLATELET PHERESIS
Blood Product Expiration Date: 202101062359
Blood Product Expiration Date: 202101062359
ISSUE DATE / TIME: 202101041041
ISSUE DATE / TIME: 202101041150
Unit Type and Rh: 5100
Unit Type and Rh: 5100

## 2019-06-17 MED ORDER — MIDAZOLAM HCL 2 MG/2ML IJ SOLN
INTRAMUSCULAR | Status: AC | PRN
  Administered 2019-06-17 (×2): 0.5 mg via INTRAVENOUS

## 2019-06-17 MED ORDER — FENTANYL CITRATE (PF) 100 MCG/2ML IJ SOLN
INTRAMUSCULAR | Status: AC | PRN
  Administered 2019-06-17 (×2): 25 ug via INTRAVENOUS

## 2019-06-17 MED ORDER — FENTANYL CITRATE (PF) 100 MCG/2ML IJ SOLN
INTRAMUSCULAR | Status: AC
  Filled 2019-06-17: qty 2

## 2019-06-17 MED ORDER — HEPARIN SOD (PORK) LOCK FLUSH 100 UNIT/ML IV SOLN
INTRAVENOUS | Status: AC
  Filled 2019-06-17: qty 5

## 2019-06-17 MED ORDER — SODIUM CHLORIDE 0.9 % IV SOLN: INTRAVENOUS | Status: DC

## 2019-06-17 MED ORDER — MIDAZOLAM HCL 2 MG/2ML IJ SOLN
INTRAMUSCULAR | Status: AC
  Filled 2019-06-17: qty 2

## 2019-06-17 NOTE — Progress Notes (Signed)
Patient clinically stable post BMB per Dr Annamaria Boots, tolerated well with vitals stable throughout. Awake/alert and oriented post procedure. Received Versed 1mg  along with Fentanyll 50 mcg for procedure.

## 2019-06-17 NOTE — Discharge Instructions (Signed)
Bone Marrow Aspiration and Bone Marrow Biopsy, Adult, Care After This sheet gives you information about how to care for yourself after your procedure. Your health care provider may also give you more specific instructions. If you have problems or questions, contact your health care provider. What can I expect after the procedure? After the procedure, it is common to have:  Mild pain and tenderness.  Swelling.  Bruising. Follow these instructions at home: Puncture site care   Follow instructions from your health care provider about how to take care of the puncture site. Make sure you: ? Wash your hands with soap and water before and after you change your bandage (dressing). If soap and water are not available, use hand sanitizer. ? Change your dressing as told by your health care provider.  Check your puncture site every day for signs of infection. Check for: ? More redness, swelling, or pain. ? Fluid or blood. ? Warmth. ? Pus or a bad smell. Activity  Return to your normal activities as told by your health care provider. Ask your health care provider what activities are safe for you.  Do not lift anything that is heavier than 10 lb (4.5 kg), or the limit that you are told, until your health care provider says that it is safe.  Do not drive for 24 hours if you were given a sedative during your procedure. General instructions   Take over-the-counter and prescription medicines only as told by your health care provider.  Do not take baths, swim, or use a hot tub until your health care provider approves. Ask your health care provider if you may take showers. You may only be allowed to take sponge baths.  If directed, put ice on the affected area. To do this: ? Put ice in a plastic bag. ? Place a towel between your skin and the bag. ? Leave the ice on for 20 minutes, 2-3 times a day.  Keep all follow-up visits as told by your health care provider. This is important. Contact a  health care provider if:  Your pain is not controlled with medicine.  You have a fever.  You have more redness, swelling, or pain around the puncture site.  You have fluid or blood coming from the puncture site.  Your puncture site feels warm to the touch.  You have pus or a bad smell coming from the puncture site. Summary  After the procedure, it is common to have mild pain, tenderness, swelling, and bruising.  Follow instructions from your health care provider about how to take care of the puncture site and what activities are safe for you.  Take over-the-counter and prescription medicines only as told by your health care provider.  Contact a health care provider if you have any signs of infection, such as fluid or blood coming from the puncture site. This information is not intended to replace advice given to you by your health care provider. Make sure you discuss any questions you have with your health care provider. Document Revised: 10/15/2018 Document Reviewed: 10/15/2018 Elsevier Patient Education  [place].

## 2019-06-17 NOTE — H&P (Signed)
Chief Complaint:   AML, anemia  Referring Physician(s): Brahmanday,Govinda R  History of Present Illness: Kathryn Hahn is a 86 y.o. female with chronic anemia and MDS/ AML .  Here for repeat BM asp and core bx. No complaints today and feeling well.  She states she is getting the Covid vaccine this afternoon. No recent illness or fevers.  Past Medical History:  Diagnosis Date  . Anemia   . Arthritis   . Breast cancer ([place]) 2003   RT LUMPECTOMY  . Edema leg   . History of breast cancer 2003   post lumpectomy  . History of kidney stones   . Hyperlipemia, mixed   . Hypertension, essential   . Leukemia ([place])   . Leukocytosis   . MDS (myelodysplastic syndrome) ([place])   . Personal history of radiation therapy 2003   BREAST CA  . Renal stones   . Vitamin D deficiency     Past Surgical History:  Procedure Laterality Date  . BREAST BIOPSY Right 2003   Postive for cancer  . BREAST LUMPECTOMY Right 2003   BREAST CA  . KIDNEY STONE SURGERY Right     Allergies: Terbinafine  Medications: Prior to Admission medications   Medication Sig Start Date End Date Taking? Authorizing Provider  acetaminophen (TYLENOL) 325 MG tablet Take 325 mg by mouth every 6 (six) hours as needed for mild pain or moderate pain.    Yes [provider]  acyclovir (ZOVIRAX) 400 MG tablet ONE PILL A DAY [TO PREVENT SHINGLES] 03/07/19  Yes Cammie Sickle, MD  atenolol (TENORMIN) 50 MG tablet Take 1 tablet by mouth 2 (two) times daily.   Yes [provider]  calcium carbonate (OSCAL) 1500 (600 Ca) MG TABS tablet Take 600 mg of elemental calcium by mouth daily with breakfast.   Yes [provider]  Cholecalciferol (VITAMIN D) 2000 units tablet Take 1 tablet by mouth daily.   Yes [provider]  [place] (DERMACLOUD) CREA Apply topically.   Yes [provider]  levofloxacin (LEVAQUIN) 250 MG tablet Take 1 tablet (250 mg total) by  mouth daily. 05/05/19  Yes Cammie Sickle, MD  losartan-hydrochlorothiazide (HYZAAR) 50-12.5 MG tablet Take 1 tablet by mouth daily.   Yes [provider]  magic mouthwash w/lidocaine SOLN Take 5 mLs by mouth 4 (four) times daily as needed for mouth pain. 11/11/18  Yes Cammie Sickle, MD  magnesium hydroxide (MILK OF MAGNESIA) 400 MG/5ML suspension Take 5 mLs by mouth every 4 (four) hours as needed.   Yes [provider]  ondansetron (ZOFRAN) 8 MG tablet Take by mouth every 8 (eight) hours as needed for nausea or vomiting.   Yes [provider]  polyethylene glycol (MIRALAX / GLYCOLAX) 17 g packet Take 17 g by mouth daily.   Yes [provider]  posaconazole (NOXAFIL) 100 MG TBEC delayed-release tablet Take 1 tablet (100 mg total) by mouth daily. 06/03/19  Yes Cammie Sickle, MD  Potassium Chloride ER 20 MEQ TBCR Take 20 mEq by mouth daily. 06/03/19  Yes Cammie Sickle, MD  potassium chloride SA (KLOR-CON M20) 20 MEQ tablet Take 1 tablet (20 mEq total) by mouth 2 (two) times daily. 05/20/19  Yes Cammie Sickle, MD  senna (SENOKOT) 8.6 MG tablet Take 2 tablets by mouth 2 (two) times daily.   Yes [provider]  eltrombopag (PROMACTA) 50 MG tablet Take 1 tablet (50 mg total) by  mouth daily. Take on an empty stomach 1 hour before a meal or 2 hours after Patient not taking: Reported on 06/16/2019 04/25/19   Cammie Sickle, MD  Gilteritinib Fumarate (XOSPATA) 40 MG TABS Take 80 mg by mouth daily. Patient not taking: Reported on 06/16/2019 02/07/19   Cammie Sickle, MD  heparin lock flush 100 UNIT/ML SOLN injection Inject into the vein.    [provider]     Family History  Problem Relation Age of Onset  . Stroke Mother   . Hypertension Father   . Stroke Father   . Breast cancer Neg Hx     Social History   Socioeconomic History  . Marital status: Married    Spouse name: Not on file  . Number of  children: Not on file  . Years of education: Not on file  . Highest education level: Not on file  Occupational History  . Not on file  Tobacco Use  . Smoking status: Never Smoker  . Smokeless tobacco: Never Used  Substance and Sexual Activity  . Alcohol use: Yes    Alcohol/week: 3.0 standard drinks    Types: 3 Glasses of wine per week  . Drug use: No  . Sexual activity: Not on file  Other Topics Concern  . Not on file  Social History Narrative    No smoking/alcohol; lives in [place] with family.  She lives in assisted living with her husband.   Social Determinants of Health   Financial Resource Strain:   . Difficulty of Paying Living Expenses: Not on file  Food Insecurity:   . Worried About Charity fundraiser in the Last Year: Not on file  . Ran Out of Food in the Last Year: Not on file  Transportation Needs:   . Lack of Transportation (Medical): Not on file  . Lack of Transportation (Non-Medical): Not on file  Physical Activity:   . Days of Exercise per Week: Not on file  . Minutes of Exercise per Session: Not on file  Stress:   . Feeling of Stress : Not on file  Social Connections:   . Frequency of Communication with Friends and Family: Not on file  . Frequency of Social Gatherings with Friends and Family: Not on file  . Attends Religious Services: Not on file  . Active Member of Clubs or Organizations: Not on file  . Attends Archivist Meetings: Not on file  . Marital Status: Not on file    ECOG Status: 2 - Symptomatic, <50% confined to bed  Review of Systems: A 12 point ROS discussed and pertinent positives are indicated in the HPI above.  All other systems are negative.  Review of Systems  Vital Signs: BP (!) 167/79   Pulse 65   Temp 97.6 F (36.4 C) (Oral)   Resp 20   Ht 4\' 9"  (1.448 m)   Wt 44 kg   SpO2 100%   BMI 20.99 kg/m   Physical Exam Constitutional:      General: She is not in acute distress.    Appearance: She is not  toxic-appearing or diaphoretic.  Eyes:     General: No scleral icterus.    Conjunctiva/sclera: Conjunctivae normal.  Cardiovascular:     Rate and Rhythm: Normal rate and regular rhythm.     Heart sounds: No murmur.  Pulmonary:     Effort: Pulmonary effort is normal. No respiratory distress.     Breath sounds: Normal breath sounds.  Abdominal:  General: Abdomen is flat. Bowel sounds are normal.  Neurological:     General: No focal deficit present.     Mental Status: She is alert. Mental status is at baseline.  Psychiatric:        Mood and Affect: Mood normal.     Mallampati Score:   1  Imaging: No results found.  Labs:  CBC: Recent Labs    06/04/19 0812 06/09/19 0844 06/12/19 0840 06/16/19 0830  WBC 2.4* 1.9* 2.1* 2.2*  HGB 8.4* 7.5* 8.3* 8.0*  HCT 26.3* 23.9* 26.2* 25.7*  PLT 12* 8* 9* 5*    COAGS: Recent Labs    12/24/18 0754 01/10/19 1418 04/21/19 0801 06/17/19 0813  INR 1.1 1.2 1.1 1.1  APTT  --  38*  --   --     BMP: Recent Labs    06/02/19 0808 06/04/19 0812 06/09/19 0844 06/16/19 0830  NA 133* 134* 137 137  K 3.6 3.8 3.5 4.0  CL 99 98 100 101  CO2 25 24 27 25   GLUCOSE 181* 168* 166* 135*  BUN 33* 33* 26* 31*  CALCIUM 9.0 8.9 9.1 9.0  CREATININE 0.95 0.92 0.86 0.85  GFRNONAA 55* 58* >60 >60  GFRAA >60 >60 >60 >60    LIVER FUNCTION TESTS: Recent Labs    05/26/19 0845 06/02/19 0808 06/04/19 0812 06/09/19 0844  BILITOT 0.5 0.5 0.6 0.4  AST 44* 58* 62* 55*  ALT 26 30 29 29   ALKPHOS 66 67 70 73  PROT 6.1* 6.2* 6.0* 6.1*  ALBUMIN 3.6 3.6 3.6 3.4*    TUMOR MARKERS: No results for input(s): AFPTM, CEA, CA199, CHROMGRNA in the last 8760 hours.  Assessment and Plan:  MDS/AML, plan for repeat CT BM today to assess response.    Risks and benefits of CT bm asp and bx was discussed with the patient and/or patient's family including, but not limited to bleeding, infection, damage to adjacent structures or low yield requiring  additional tests.  All of the questions were answered and there is agreement to proceed.  Consent signed and in chart.    Thank you for this interesting consult.  I greatly enjoyed meeting Kathryn Hahn and look forward to participating in their care.  A copy of this report was sent to the requesting provider on this date.  Electronically Signed: Greggory Keen 06/17/2019, 9:01 AM   I spent a total of  30 Minutes   in face to face in clinical consultation, greater than 50% of which was counseling/coordinating care for this pt for BM bx today.

## 2019-06-17 NOTE — Procedures (Signed)
AML  S/p CT BM ASP AND CORE BX  No comp Stable ebl min Path pending Full report in pacs

## 2019-06-18 ENCOUNTER — Inpatient Hospital Stay: Payer: Medicare Other | Admitting: *Deleted

## 2019-06-18 ENCOUNTER — Other Ambulatory Visit: Payer: Self-pay | Admitting: *Deleted

## 2019-06-18 ENCOUNTER — Other Ambulatory Visit: Payer: Self-pay

## 2019-06-18 ENCOUNTER — Inpatient Hospital Stay: Payer: Medicare Other

## 2019-06-18 DIAGNOSIS — C9200 Acute myeloblastic leukemia, not having achieved remission: Secondary | ICD-10-CM

## 2019-06-18 DIAGNOSIS — Z452 Encounter for adjustment and management of vascular access device: Secondary | ICD-10-CM

## 2019-06-18 LAB — CBC WITH DIFFERENTIAL/PLATELET
Abs Immature Granulocytes: 0 10*3/uL (ref 0.00–0.07)
Basophils Absolute: 0 10*3/uL (ref 0.0–0.1)
Basophils Relative: 0 %
Blasts: 19 %
Eosinophils Absolute: 0 10*3/uL (ref 0.0–0.5)
Eosinophils Relative: 0 %
HCT: 25.8 % — ABNORMAL LOW (ref 36.0–46.0)
Hemoglobin: 7.9 g/dL — ABNORMAL LOW (ref 12.0–15.0)
Lymphocytes Relative: 36 %
Lymphs Abs: 0.7 10*3/uL (ref 0.7–4.0)
MCH: 26.8 pg (ref 26.0–34.0)
MCHC: 30.6 g/dL (ref 30.0–36.0)
MCV: 87.5 fL (ref 80.0–100.0)
Monocytes Absolute: 0.6 10*3/uL (ref 0.1–1.0)
Monocytes Relative: 32 %
Neutro Abs: 0.3 10*3/uL — ABNORMAL LOW (ref 1.7–7.7)
Neutrophils Relative %: 13 %
Platelets: 21 10*3/uL — CL (ref 150–400)
RBC: 2.95 MIL/uL — ABNORMAL LOW (ref 3.87–5.11)
RDW: 17.8 % — ABNORMAL HIGH (ref 11.5–15.5)
Smear Review: NORMAL
WBC: 2 10*3/uL — ABNORMAL LOW (ref 4.0–10.5)
nRBC: 3.6 % — ABNORMAL HIGH (ref 0.0–0.2)

## 2019-06-18 LAB — SAMPLE TO BLOOD BANK

## 2019-06-18 LAB — PREPARE RBC (CROSSMATCH)

## 2019-06-18 MED ORDER — HEPARIN SOD (PORK) LOCK FLUSH 100 UNIT/ML IV SOLN
INTRAVENOUS | Status: AC
  Filled 2019-06-18: qty 5

## 2019-06-18 MED ORDER — HEPARIN SOD (PORK) LOCK FLUSH 100 UNIT/ML IV SOLN
500.0000 [IU] | Freq: Once | INTRAVENOUS | Status: AC
  Administered 2019-06-18: 12:00:00 500 [IU] via INTRAVENOUS
  Filled 2019-06-18: qty 5

## 2019-06-18 MED ORDER — SODIUM CHLORIDE 0.9% FLUSH
10.0000 mL | Freq: Once | INTRAVENOUS | Status: AC
  Administered 2019-06-18: 10 mL via INTRAVENOUS
  Filled 2019-06-18: qty 10

## 2019-06-18 MED ORDER — ACETAMINOPHEN 325 MG PO TABS
650.0000 mg | ORAL_TABLET | Freq: Once | ORAL | Status: AC
  Administered 2019-06-18: 650 mg via ORAL
  Filled 2019-06-18: qty 2

## 2019-06-18 MED ORDER — SODIUM CHLORIDE 0.9% IV SOLUTION
250.0000 mL | Freq: Once | INTRAVENOUS | Status: AC
  Administered 2019-06-18: 50 mL via INTRAVENOUS
  Filled 2019-06-18: qty 250

## 2019-06-18 MED ORDER — DIPHENHYDRAMINE HCL 25 MG PO CAPS
25.0000 mg | ORAL_CAPSULE | Freq: Once | ORAL | Status: DC
  Filled 2019-06-18: qty 1

## 2019-06-19 LAB — TYPE AND SCREEN
ABO/RH(D): O POS
Antibody Screen: NEGATIVE
Unit division: 0

## 2019-06-19 LAB — BPAM RBC
Blood Product Expiration Date: 202101162359
ISSUE DATE / TIME: 202101061011
Unit Type and Rh: 5100

## 2019-06-19 LAB — SURGICAL PATHOLOGY

## 2019-06-20 ENCOUNTER — Encounter: Payer: Self-pay | Admitting: Internal Medicine

## 2019-06-20 ENCOUNTER — Other Ambulatory Visit: Payer: Self-pay

## 2019-06-20 NOTE — Progress Notes (Signed)
Spoke with husband states patient is doing well no complaints and they got their covid vaccine

## 2019-06-21 ENCOUNTER — Other Ambulatory Visit: Payer: Self-pay | Admitting: Internal Medicine

## 2019-06-23 ENCOUNTER — Inpatient Hospital Stay: Payer: Medicare Other | Admitting: *Deleted

## 2019-06-23 ENCOUNTER — Inpatient Hospital Stay (HOSPITAL_BASED_OUTPATIENT_CLINIC_OR_DEPARTMENT_OTHER): Payer: Medicare Other | Admitting: Hospice and Palliative Medicine

## 2019-06-23 ENCOUNTER — Telehealth: Payer: Self-pay | Admitting: Internal Medicine

## 2019-06-23 ENCOUNTER — Other Ambulatory Visit: Payer: Self-pay

## 2019-06-23 ENCOUNTER — Other Ambulatory Visit: Payer: Self-pay | Admitting: *Deleted

## 2019-06-23 ENCOUNTER — Inpatient Hospital Stay: Payer: Medicare Other

## 2019-06-23 ENCOUNTER — Inpatient Hospital Stay (HOSPITAL_BASED_OUTPATIENT_CLINIC_OR_DEPARTMENT_OTHER): Payer: Medicare Other | Admitting: Internal Medicine

## 2019-06-23 DIAGNOSIS — Z7189 Other specified counseling: Secondary | ICD-10-CM

## 2019-06-23 DIAGNOSIS — Z515 Encounter for palliative care: Secondary | ICD-10-CM | POA: Diagnosis not present

## 2019-06-23 DIAGNOSIS — C9200 Acute myeloblastic leukemia, not having achieved remission: Secondary | ICD-10-CM

## 2019-06-23 DIAGNOSIS — Z452 Encounter for adjustment and management of vascular access device: Secondary | ICD-10-CM

## 2019-06-23 LAB — COMPREHENSIVE METABOLIC PANEL
ALT: 28 U/L (ref 0–44)
AST: 41 U/L (ref 15–41)
Albumin: 3.5 g/dL (ref 3.5–5.0)
Alkaline Phosphatase: 74 U/L (ref 38–126)
Anion gap: 12 (ref 5–15)
BUN: 28 mg/dL — ABNORMAL HIGH (ref 8–23)
CO2: 25 mmol/L (ref 22–32)
Calcium: 9.1 mg/dL (ref 8.9–10.3)
Chloride: 100 mmol/L (ref 98–111)
Creatinine, Ser: 0.85 mg/dL (ref 0.44–1.00)
GFR calc Af Amer: >60 mL/min (ref >60)
GFR calc non Af Amer: >60 mL/min (ref >60)
Glucose, Bld: 200 mg/dL — ABNORMAL HIGH (ref 70–99)
Potassium: 3.6 mmol/L (ref 3.5–5.1)
Sodium: 137 mmol/L (ref 135–145)
Total Bilirubin: 0.6 mg/dL (ref 0.3–1.2)
Total Protein: 6.4 g/dL — ABNORMAL LOW (ref 6.5–8.1)

## 2019-06-23 LAB — CBC WITH DIFFERENTIAL/PLATELET
Abs Immature Granulocytes: 0 10*3/uL (ref 0.00–0.07)
Basophils Absolute: 0 10*3/uL (ref 0.0–0.1)
Basophils Relative: 0 %
Blasts: 28 %
Eosinophils Absolute: 0 10*3/uL (ref 0.0–0.5)
Eosinophils Relative: 0 %
HCT: 29.2 % — ABNORMAL LOW (ref 36.0–46.0)
Hemoglobin: 9 g/dL — ABNORMAL LOW (ref 12.0–15.0)
Lymphocytes Relative: 24 %
Lymphs Abs: 0.8 10*3/uL (ref 0.7–4.0)
MCH: 26.9 pg (ref 26.0–34.0)
MCHC: 30.8 g/dL (ref 30.0–36.0)
MCV: 87.4 fL (ref 80.0–100.0)
Monocytes Absolute: 1 10*3/uL (ref 0.1–1.0)
Monocytes Relative: 29 %
Neutro Abs: 0.6 10*3/uL — ABNORMAL LOW (ref 1.7–7.7)
Neutrophils Relative %: 19 %
Platelets: 5 10*3/uL — CL (ref 150–400)
RBC: 3.34 MIL/uL — ABNORMAL LOW (ref 3.87–5.11)
RDW: 17.2 % — ABNORMAL HIGH (ref 11.5–15.5)
Smear Review: DECREASED
WBC: 3.4 10*3/uL — ABNORMAL LOW (ref 4.0–10.5)
nRBC: 1.8 % — ABNORMAL HIGH (ref 0.0–0.2)

## 2019-06-23 LAB — LACTATE DEHYDROGENASE: LDH: 1157 U/L — ABNORMAL HIGH (ref 98–192)

## 2019-06-23 LAB — PHOSPHORUS: Phosphorus: 4 mg/dL (ref 2.5–4.6)

## 2019-06-23 LAB — URIC ACID: Uric Acid, Serum: 7 mg/dL (ref 2.5–7.1)

## 2019-06-23 LAB — SAMPLE TO BLOOD BANK

## 2019-06-23 LAB — MAGNESIUM: Magnesium: 2.1 mg/dL (ref 1.7–2.4)

## 2019-06-23 MED ORDER — ACETAMINOPHEN 325 MG PO TABS
650.0000 mg | ORAL_TABLET | Freq: Once | ORAL | Status: AC
  Administered 2019-06-23: 650 mg via ORAL
  Filled 2019-06-23: qty 2

## 2019-06-23 MED ORDER — DIPHENHYDRAMINE HCL 25 MG PO CAPS
25.0000 mg | ORAL_CAPSULE | Freq: Once | ORAL | Status: DC
  Filled 2019-06-23: qty 1

## 2019-06-23 MED ORDER — SODIUM CHLORIDE 0.9% IV SOLUTION
250.0000 mL | Freq: Once | INTRAVENOUS | Status: AC
  Administered 2019-06-23: 250 mL via INTRAVENOUS
  Filled 2019-06-23: qty 250

## 2019-06-23 MED ORDER — HEPARIN SOD (PORK) LOCK FLUSH 100 UNIT/ML IV SOLN
500.0000 [IU] | Freq: Once | INTRAVENOUS | Status: AC
  Administered 2019-06-23: 500 [IU] via INTRAVENOUS
  Filled 2019-06-23: qty 5

## 2019-06-23 MED ORDER — HEPARIN SOD (PORK) LOCK FLUSH 100 UNIT/ML IV SOLN
INTRAVENOUS | Status: AC
  Filled 2019-06-23: qty 5

## 2019-06-23 MED ORDER — SODIUM CHLORIDE 0.9% FLUSH
10.0000 mL | Freq: Once | INTRAVENOUS | Status: AC
  Administered 2019-06-23: 10 mL via INTRAVENOUS
  Filled 2019-06-23: qty 10

## 2019-06-23 MED ORDER — XOSPATA 40 MG PO TABS: 120.0000 mg | ORAL_TABLET | Freq: Every day | ORAL | 2 refills | Status: DC

## 2019-06-23 NOTE — Assessment & Plan Note (Addendum)
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 Currently on gilteritinib 80 mg a day; January 5 bone marrow biopsy-approximately 27% blasts present-partial response.    #Discussed options: # Increasing gilteritinib to 120 mg a day #2 proceeding with venetoclax/Vidaza-which is going to more intensive/need for hospitalizations.  After multiple discussion-it was decided to proceed with gilteritinib at this time.  New prescription noted.  # Leucopenia/ anemia/thrombocytopenia-White count 3.2  ANC- 0.3; /hemoglobin 9.0; platelets 5:  secondary to underlying acute myeloid leukemia/Gilteritinib.  Proceed with platelet transfusion today/PRBC as needed.  #Patient s/p Covid vaccination-#1-1/05 [#2 on 1/28]; s/p pack; also units of platelets.   #Supportive care for AML-  -Continue Noxafil/posaconazole '100mg'$  and acyclovir 400 mg, and levaquin 250 mg for prophylaxis. Continue PICC line dressing changes weekly  #Discussed the overall prognosis continues to be guarded/cannot be cured.  Discussed the life expectancy the median is around 12 months or so.  Recommend palliative care evaluation.  Discussed with Josh/in agreement.  #I spoke to patient husband regarding plan of care.  In agreement.  Also discussed with Dr.Rizzzeri from [place].  Disposition: # 1 unit platelets today; NO PRBC.  # 1/14- labs- hold tube'cbc; possible 1 unit platelets/1 unit PRBC transfusion- # Follow up- Jan 18th MD- labs-cbc/cmp;LDH; hold tube;possible transfusion # 1/21 labs- hold tube'cbc; possible 1 unit platelets/1 unit PRBC transfusion-Dr.B

## 2019-06-23 NOTE — Progress Notes (Signed)
[facility] OFFICE PROGRESS NOTE  Patient Care Team: Rusty Aus, MD as PCP - General (Internal Medicine)  CHIEF COMPLAINTS/PURPOSE OF CONSULTATION: Acute myeloid leukemia   Oncology History Overview Note  # SEP 2017- MYELOPROLIFERATIVE NEOPLASM- [WBC- 20; normal Hb/platelets] hypercellular bone marrow 90-95% proliferation of myeloid cells in various stages of maturation; relative erythroid hypoplasia and proliferation of atypical megakaryocytes; no increase in blasts; peripheral blood Bcr-Abl-NEG; Cytogenetics- WNL. NEG- Jak-2/MPL/CALR [place] limited- mild splenomegaly [~10cm; 463cm3]; OCT 2017- second opinion at [place]. Surveillance.   # With progressive leukocytosis we evaluated her a month ago and sent BM for exam. She has a progressive leukocytosis so hydrea '500mg'$  daily was started on 10/20/2016 and increased to 2gm daily then back down to one daily, then 5 days per week over time due to counts drop. Then we held her hydrea since 04/09/2018 due to continued declining Hb and Plt.s  BMB 06/24/18 showed markedly hypercellular BM 95% with myeloid hyperplasia and atypical megakaryocytic hyperplasia. No significant increase in blasts. 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 BCR / ABL negative, FISH normal. Flow Showed 2%CD34-positive myeloid blasts. Myeloid precursors with low side scatter. Pathogenic variants were detected in the ASXL1, CCND2, CUX1, and U2AF1 genes.  # March 2020- wbc- 14/ Hb 9.5/ platelets- 54.   ---------------------------------------------------   # June 8th 2020- Acute myeloid leukemia [peripheral blood flow cytometry;NGS- pending]- June 15th Vidaza [SQ 1-5 + Venatoclax- #1in pt]; tumor lysis prophylaxis  # Day #28 bone marrow biopsy-  7/14- BMx- NO BLASTS; positive for dysplastic changes. OFF Venatoclax [since 7/17] sec to cytopenias.  #817 cycle #2 Vidaza+ venetoclax 100 mg; stop venetoclax on  8/24 [severe pancytopenia]   # 02/18/2019-start gilteritinib 80 mg a day; HELD on 9/14 [sec to severe pan]  # NOV 9th/2020-BMBx- RELAPSED AML; NOV 11th- STARTED GILTERITINIB '40mg'$ /day; NOV17th 2020- PROMACTA '50mg'$ /day; NOV 30th- inrease Gilt to 80 mg/day; DEC 31st-STOP Gilt/ Promatca [no reponse; JAN 5th BMBx- partial response; PROCEED with GILTERTINIB '120mg'$ /day.    # AUG 2020- AST elevation- 248-?sec to Posonoazole; RESOLVED; continue at lower dose 100 mg/day.   # 2002 [[place]] BREAST CA s/p Lumpect RT; [? Stage I] no chemo s/p AI  # F-ONE HEM: FLT3 ALTERATION NOTED  # JAN 2021- Palliative care evaluation.   DIAGNOSIS: ACUTE MYELOID LEUKEMIA GOALS: pallaitive  CURRENT/MOST RECENT THERAPY : Gilteritinib     MDS (myelodysplastic syndrome) (HCC) (Resolved)  08/28/2018 Initial Diagnosis   MDS (myelodysplastic syndrome) (HCC)   Acute myeloid leukemia not having achieved remission ([place])  11/25/2018 Initial Diagnosis   AML (acute myeloid leukemia) with failed remission ([place])   11/25/2018 - 12/22/2018 Chemotherapy   The patient had azaCITIDine (VIDAZA) chemo injection 105 mg, 75 mg/m2 = 105 mg, Subcutaneous,  Once, 1 of 4 cycles Administration: 105 mg (11/25/2018), 105 mg (11/26/2018), 105 mg (11/27/2018), 105 mg (11/28/2018), 105 mg (11/29/2018)  for chemotherapy treatment.    01/27/2019 -  Chemotherapy   The patient had palonosetron (ALOXI) injection 0.25 mg, 0.25 mg, Intravenous,  Once, 1 of 4 cycles Administration: 0.25 mg (01/27/2019), 0.25 mg (01/29/2019), 0.25 mg (01/31/2019) azaCITIDine (VIDAZA) 100 mg in sodium chloride 0.9 % 50 mL chemo infusion, 75 mg/m2 = 100 mg, Intravenous, Once, 1 of 4 cycles Administration: 100 mg (01/27/2019), 100 mg (01/28/2019), 100 mg (01/29/2019), 100 mg (01/30/2019), 100 mg (01/31/2019)  for chemotherapy treatment.      HISTORY OF PRESENTING ILLNESS:  Kathryn Hahn 86 y.o.  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  Patient stopped gilteritinib/Promacta on December 31st 2020.  Patient's s/p bone marrow biopsy on January 5-partial response.  Patient started back on gilteritinib 120 mg a day on jan 8th.  Continues to bruise easily.  Continues to have blood blisters small.  No fever no chills.  No nausea no vomiting no diarrhea.  No swelling of the legs.  Review of Systems  Constitutional: Positive for malaise/fatigue. Negative for chills, diaphoresis, fever and weight loss.  HENT: Negative for nosebleeds and sore throat.   Eyes: Negative for double vision.  Respiratory: Negative for cough, hemoptysis, sputum production and wheezing.   Cardiovascular: Negative for chest pain, palpitations and orthopnea.  Gastrointestinal: Negative for abdominal pain, blood in stool, constipation, diarrhea, heartburn, melena, nausea and vomiting.  Genitourinary: Negative for dysuria, frequency and urgency.  Musculoskeletal: Positive for back pain. Negative for joint pain.  Skin: Negative.  Negative for itching and rash.  Neurological: Negative for dizziness, tingling, focal weakness, weakness and headaches.  Endo/Heme/Allergies: Bruises/bleeds easily.  Psychiatric/Behavioral: Negative for depression. The patient is not nervous/anxious and does not have insomnia.     MEDICAL HISTORY:  Past Medical History:  Diagnosis Date  . Anemia   . Arthritis   . Breast cancer ([place]) 2003   RT LUMPECTOMY  . Edema leg   . History of breast cancer 2003   post lumpectomy  . History of kidney stones   . Hyperlipemia, mixed   . Hypertension, essential   . Leukemia ([place])   . Leukocytosis   . MDS (myelodysplastic syndrome) ([place])   . Personal history of radiation therapy 2003   BREAST CA  . Renal stones   . Vitamin D deficiency     SURGICAL HISTORY: Past Surgical History:  Procedure Laterality Date  . BREAST BIOPSY Right 2003    Postive for cancer  . BREAST LUMPECTOMY Right 2003   BREAST CA  . KIDNEY STONE SURGERY Right     SOCIAL HISTORY: Social History   Socioeconomic History  . Marital status: Married    Spouse name: Not on file  . Number of children: Not on file  . Years of education: Not on file  . Highest education level: Not on file  Occupational History  . Not on file  Tobacco Use  . Smoking status: Never Smoker  . Smokeless tobacco: Never Used  Substance and Sexual Activity  . Alcohol use: Yes    Alcohol/week: 3.0 standard drinks    Types: 3 Glasses of wine per week  . Drug use: No  . Sexual activity: Not on file  Other Topics Concern  . Not on file  Social History Narrative    No smoking/alcohol; lives in [place] with family.  She lives in assisted living with her husband.   Social Determinants of Health   Financial Resource Strain:   . Difficulty of Paying Living Expenses: Not on file  Food Insecurity:   . Worried About Charity fundraiser in the Last Year: Not on file  . Ran Out of Food in the Last Year: Not on file  Transportation Needs:   . Lack of Transportation (Medical): Not on file  . Lack of Transportation (Non-Medical): Not on file  Physical Activity:   . Days of Exercise per Week: Not on file  . Minutes of Exercise per Session: Not on file  Stress:   . Feeling of Stress :  Not on file  Social Connections:   . Frequency of Communication with Friends and Family: Not on file  . Frequency of Social Gatherings with Friends and Family: Not on file  . Attends Religious Services: Not on file  . Active Member of Clubs or Organizations: Not on file  . Attends Archivist Meetings: Not on file  . Marital Status: Not on file  Intimate Partner Violence:   . Fear of Current or Ex-Partner: Not on file  . Emotionally Abused: Not on file  . Physically Abused: Not on file  . Sexually Abused: Not on file    FAMILY HISTORY: Family History  Problem Relation Age of  Onset  . Stroke Mother   . Hypertension Father   . Stroke Father   . Breast cancer Neg Hx     ALLERGIES:  is allergic to terbinafine.  MEDICATIONS:  Current Outpatient Medications  Medication Sig Dispense Refill  . acetaminophen (TYLENOL) 325 MG tablet Take 325 mg by mouth every 6 (six) hours as needed for mild pain or moderate pain.     Marland Kitchen acyclovir (ZOVIRAX) 400 MG tablet ONE PILL A DAY [TO PREVENT SHINGLES] 90 tablet 1  . atenolol (TENORMIN) 50 MG tablet Take 1 tablet by mouth 2 (two) times daily.    . calcium carbonate (OSCAL) 1500 (600 Ca) MG TABS tablet Take 600 mg of elemental calcium by mouth daily with breakfast.    . Cholecalciferol (VITAMIN D) 2000 units tablet Take 1 tablet by mouth daily.    . heparin lock flush 100 UNIT/ML SOLN injection Inject into the vein.    . Infant Care Products (DERMACLOUD) CREA Apply topically.    Marland Kitchen levofloxacin (LEVAQUIN) 250 MG tablet Take 1 tablet (250 mg total) by mouth daily. 30 tablet 3  . losartan-hydrochlorothiazide (HYZAAR) 50-12.5 MG tablet Take 1 tablet by mouth daily.    . posaconazole (NOXAFIL) 100 MG TBEC delayed-release tablet Take 1 tablet (100 mg total) by mouth daily. 30 tablet 3  . Potassium Chloride ER 20 MEQ TBCR Take 20 mEq by mouth daily. 60 tablet 1  . senna (SENOKOT) 8.6 MG tablet Take 2 tablets by mouth 2 (two) times daily.    Marland Kitchen eltrombopag (PROMACTA) 50 MG tablet Take 1 tablet (50 mg total) by mouth daily. Take on an empty stomach 1 hour before a meal or 2 hours after (Patient not taking: Reported on 06/20/2019) 30 tablet 3  . Gilteritinib Fumarate (XOSPATA) 40 MG TABS Take 120 mg by mouth daily. 90 tablet 2  . KLOR-CON M20 20 MEQ tablet TAKE 1 TABLET BY MOUTH TWICE A DAY 180 tablet 0  . magic mouthwash w/lidocaine SOLN Take 5 mLs by mouth 4 (four) times daily as needed for mouth pain. (Patient not taking: Reported on 06/20/2019) 480 mL 3  . magnesium hydroxide (MILK OF MAGNESIA) 400 MG/5ML suspension Take 5 mLs by mouth every 4  (four) hours as needed.    . ondansetron (ZOFRAN) 8 MG tablet Take by mouth every 8 (eight) hours as needed for nausea or vomiting.    . polyethylene glycol (MIRALAX / GLYCOLAX) 17 g packet Take 17 g by mouth daily.     No current facility-administered medications for this visit.   Facility-Administered Medications Ordered in Other Visits  Medication Dose Route Frequency Provider Last Rate Last Admin  . 0.9 %  sodium chloride infusion   Intravenous Continuous Jacquelin Hawking, NP   Stopped at 02/05/19 1630  . acetaminophen (TYLENOL) tablet 650  mg  650 mg Oral Once Charlaine Dalton R, MD      . diphenhydrAMINE (BENADRYL) capsule 25 mg  25 mg Oral Once Charlaine Dalton R, MD      . heparin lock flush 100 unit/mL  250 Units Intravenous Once Cammie Sickle, MD        PHYSICAL EXAMINATION: ECOG PERFORMANCE STATUS: 1 - Symptomatic but completely ambulatory  Vitals:   06/23/19 0838  BP: (!) 147/83  Pulse: 67  Resp: 18  Temp: (!) 96.8 F (36 C)  SpO2: 100%   Filed Weights   06/23/19 0838  Weight: 97 lb 3.2 oz (44.1 kg)    Physical Exam  Constitutional: She is oriented to person, place, and time and well-developed, well-nourished, and in no distress.  PICC line in place.  No active bleeding.  Masked.  Walking herself.  Accompanied by husband.  HENT:  Head: Normocephalic and atraumatic.  Mouth/Throat: Oropharynx is clear and moist. No oropharyngeal exudate.  Ecchymosis noted in the buccal mucosa.  Eyes: Conjunctivae are normal. No scleral icterus.  Cardiovascular: Normal rate and regular rhythm.  Pulmonary/Chest: Effort normal. No respiratory distress.  Abdominal: Soft. Bowel sounds are normal. She exhibits no distension and no mass. There is no abdominal tenderness. There is no rebound and no guarding.  Musculoskeletal:        General: No tenderness or edema. Normal range of motion.     Cervical back: Normal range of motion and neck supple.  Neurological: She is  alert and oriented to person, place, and time.  Skin: Skin is warm.  Multiple bruises on lower legs, few on hands and arms. Torso bruising improved/mostly resolved  Psychiatric: Affect normal.    LABORATORY DATA:  I have reviewed the data as listed Lab Results  Component Value Date   WBC 3.4 (L) 06/23/2019   HGB 9.0 (L) 06/23/2019   HCT 29.2 (L) 06/23/2019   MCV 87.4 06/23/2019   PLT 5 (LL) 06/23/2019   Recent Labs    06/04/19 0812 06/09/19 0844 06/16/19 0830 06/23/19 0820  NA 134* 137 137 137  K 3.8 3.5 4.0 3.6  CL 98 100 101 100  CO2 '24 27 25 25  '$ GLUCOSE 168* 166* 135* 200*  BUN 33* 26* 31* 28*  CREATININE 0.92 0.86 0.85 0.85  CALCIUM 8.9 9.1 9.0 9.1  GFRNONAA 58* >60 >60 >60  GFRAA >60 >60 >60 >60  PROT 6.0* 6.1*  --  6.4*  ALBUMIN 3.6 3.4*  --  3.5  AST 62* 55*  --  41  ALT 29 29  --  28  ALKPHOS 70 73  --  74  BILITOT 0.6 0.4  --  0.6    RADIOGRAPHIC STUDIES: I have personally reviewed the radiological images as listed and agreed with the findings in the report. CT BONE MARROW BIOPSY & ASPIRATION  Result Date: 06/17/2019 INDICATION: AML EXAM: CT GUIDED RIGHT ILIAC BONE MARROW ASPIRATION AND CORE BIOPSY Date:  06/17/2019 06/17/2019 10:21 am Radiologist:  M. Daryll Brod, MD Guidance:  CT FLUOROSCOPY TIME:  Fluoroscopy Time: None. MEDICATIONS: 1% lidocaine local ANESTHESIA/SEDATION: 1.0 mg IV Versed; 50 mcg IV Fentanyl Moderate Sedation Time:  12 minutes The patient was continuously monitored during the procedure by the interventional radiology nurse under my direct supervision. CONTRAST:  None. COMPLICATIONS: None PROCEDURE: Informed consent was obtained from the patient following explanation of the procedure, risks, benefits and alternatives. The patient understands, agrees and consents for the procedure. All questions were addressed. A time  out was performed. The patient was positioned prone and non-contrast localization CT was performed of the pelvis to demonstrate the  iliac marrow spaces. Maximal barrier sterile technique utilized including caps, mask, sterile gowns, sterile gloves, large sterile drape, hand hygiene, and Betadine prep. Under sterile conditions and local anesthesia, an 11 gauge coaxial bone biopsy needle was advanced into the right iliac marrow space. Needle position was confirmed with CT imaging. Initially, bone marrow aspiration was performed. Next, the 11 gauge outer cannula was utilized to obtain a right iliac bone marrow core biopsy. Needle was removed. Hemostasis was obtained with compression. The patient tolerated the procedure well. Samples were prepared with the cytotechnologist. No immediate complications. IMPRESSION: CT guided right iliac bone marrow aspiration and core biopsy. Electronically Signed   By: Jerilynn Mages.  Shick M.D.   On: 06/17/2019 10:33    ASSESSMENT & PLAN:   Acute myeloid leukemia not having achieved remission ([place]) # Relapsed acute myeloid leukemia with monocytic differentiation-bone marrow biopsy, April 22, 2019-relapse 50 to 60% involvement-NGS-FLT3 POSITIVE. Currently on gilteritinib 80 mg a day; January 5 bone marrow biopsy-approximately 27% blasts present-partial response.    #Discussed options: # Increasing gilteritinib to 120 mg a day #2 proceeding with venetoclax/Vidaza-which is going to more intensive/need for hospitalizations.  After multiple discussion-it was decided to proceed with gilteritinib at this time.  New prescription noted.  # Leucopenia/ anemia/thrombocytopenia-White count 3.2  ANC- 0.3; /hemoglobin 9.0; platelets 5:  secondary to underlying acute myeloid leukemia/Gilteritinib.  Proceed with platelet transfusion today/PRBC as needed.  #Patient s/p Covid vaccination-#1-1/05 [#2 on 1/28]; s/p pack; also units of platelets.   #Supportive care for AML-  -Continue Noxafil/posaconazole '100mg'$  and acyclovir 400 mg, and levaquin 250 mg for prophylaxis. Continue PICC line dressing changes weekly  #Discussed the  overall prognosis continues to be guarded/cannot be cured.  Discussed the life expectancy the median is around 12 months or so.  Recommend palliative care evaluation.  Discussed with Josh/in agreement.  #I spoke to patient husband regarding plan of care.  In agreement.  Also discussed with Dr.Rizzzeri from [place].  Disposition: # 1 unit platelets today; NO PRBC.  # 1/14- labs- hold tube'cbc; possible 1 unit platelets/1 unit PRBC transfusion- # Follow up- Jan 18th MD- labs-cbc/cmp;LDH; hold tube;possible transfusion # 1/21 labs- hold tube'cbc; possible 1 unit platelets/1 unit PRBC transfusion-Dr.B

## 2019-06-23 NOTE — Progress Notes (Signed)
[place]  [phone] [phone]   Name: Kathryn Hahn Date: 06/23/2019 MRN: [id number]  DOB: [birth year]  Patient Care Team: Rusty Aus, MD as PCP - General (Internal Medicine)    REASON FOR CONSULTATION: Kathryn Hahn is a 86 y.o. 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  Patient is transfusion dependent requiring weekly platelets.  Her overall prognosis is felt to be guarded and treatment is with palliative intent.  She was referred to palliative care to help address goals.  SOCIAL HISTORY:     reports that she has never smoked. She has never used smokeless tobacco. She reports current alcohol use of about 3.0 standard drinks of alcohol per week. She reports that she does not use drugs.   Patient is married and lives at home with her husband.  She has 5 daughters, one of whom lives in [place] and the others live out of state.  Patient worked as an Automotive engineer.  ADVANCE DIRECTIVES:  On file  CODE STATUS:   PAST MEDICAL HISTORY: Past Medical History:  Diagnosis Date  . Anemia   . Arthritis   . Breast cancer ([place]) 2003   RT LUMPECTOMY  . Edema leg   . History of breast cancer 2003   post lumpectomy  . History of kidney stones   . Hyperlipemia, mixed   . Hypertension, essential   . Leukemia ([place])   . Leukocytosis   . MDS (myelodysplastic syndrome) ([place])   . Personal history of radiation therapy 2003   BREAST CA  . Renal stones   . Vitamin D deficiency     PAST SURGICAL HISTORY:  Past Surgical History:  Procedure Laterality Date  . BREAST BIOPSY Right 2003   Postive for cancer  . BREAST LUMPECTOMY Right 2003   BREAST CA  . KIDNEY STONE SURGERY Right     HEMATOLOGY/ONCOLOGY HISTORY:  Oncology History Overview Note  # SEP 2017- MYELOPROLIFERATIVE  NEOPLASM- [WBC- 53; normal Hb/platelets] hypercellular bone marrow 90-95% proliferation of myeloid cells in various stages of maturation; relative erythroid hypoplasia and proliferation of atypical megakaryocytes; no increase in blasts; peripheral blood Bcr-Abl-NEG; Cytogenetics- WNL. NEG- Jak-2/MPL/CALR [place] limited- mild splenomegaly [~10cm; 463cm3]; OCT 2017- second opinion at [facility]. Surveillance.   # With progressive leukocytosis we evaluated her a month ago and sent BM for exam. She has a progressive leukocytosis so hydrea '500mg'$  daily was started on 10/20/2016 and increased to 2gm daily then back down to one daily, then 5 days per week over time due to counts drop. Then we held her hydrea since 04/09/2018 due to continued declining Hb and Plt.s  BMB 06/24/18 showed markedly hypercellular BM 95% with myeloid hyperplasia and atypical megakaryocytic hyperplasia. No significant increase in blasts. 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 BCR / ABL negative, FISH normal. Flow Showed 2%CD34-positive myeloid blasts. Myeloid precursors with low side scatter. Pathogenic variants were detected in the ASXL1, CCND2, CUX1, and U2AF1 genes.  # March 2020- wbc- 14/ Hb 9.5/ platelets- 54.   ---------------------------------------------------   # June 8th 2020- Acute myeloid leukemia [peripheral blood flow cytometry;NGS- pending]- June 15th Vidaza [SQ 1-5 + Venatoclax- #1in pt]; tumor lysis prophylaxis  # Day #28 bone marrow biopsy-  7/14- BMx- NO BLASTS; positive for dysplastic changes. OFF Venatoclax [since 7/17] sec to cytopenias.  #817 cycle #2 Vidaza+ venetoclax 100 mg; stop  venetoclax on 8/24 [severe pancytopenia]   # 02/18/2019-start gilteritinib 80 mg a day; HELD on 9/14 [sec to severe pan]  # NOV 9th/2020-BMBx- RELAPSED AML; NOV 11th- STARTED GILTERITINIB '40mg'$ /day; NOV17th 2020- PROMACTA '50mg'$ /day; NOV 30th- inrease Gilt to 80 mg/day; DEC  31st-STOP Gilt/ Promatca [no reponse; JAN 5th BMBx- partial response; PROCEED with GILTERTINIB '120mg'$ /day.    # AUG 2020- AST elevation- 248-?sec to Posonoazole; RESOLVED; continue at lower dose 100 mg/day.   # 2002 [[place]] BREAST CA s/p Lumpect RT; [? Stage I] no chemo s/p AI  # F-ONE HEM: FLT3 ALTERATION NOTED  # JAN 2021- Palliative care evaluation.   DIAGNOSIS: ACUTE MYELOID LEUKEMIA GOALS: pallaitive  CURRENT/MOST RECENT THERAPY : Gilteritinib     MDS (myelodysplastic syndrome) (HCC) (Resolved)  08/28/2018 Initial Diagnosis   MDS (myelodysplastic syndrome) (HCC)   Acute myeloid leukemia not having achieved remission ([place])  11/25/2018 Initial Diagnosis   AML (acute myeloid leukemia) with failed remission ([place])   11/25/2018 - 12/22/2018 Chemotherapy   The patient had azaCITIDine (VIDAZA) chemo injection 105 mg, 75 mg/m2 = 105 mg, Subcutaneous,  Once, 1 of 4 cycles Administration: 105 mg (11/25/2018), 105 mg (11/26/2018), 105 mg (11/27/2018), 105 mg (11/28/2018), 105 mg (11/29/2018)  for chemotherapy treatment.    01/27/2019 -  Chemotherapy   The patient had palonosetron (ALOXI) injection 0.25 mg, 0.25 mg, Intravenous,  Once, 1 of 4 cycles Administration: 0.25 mg (01/27/2019), 0.25 mg (01/29/2019), 0.25 mg (01/31/2019) azaCITIDine (VIDAZA) 100 mg in sodium chloride 0.9 % 50 mL chemo infusion, 75 mg/m2 = 100 mg, Intravenous, Once, 1 of 4 cycles Administration: 100 mg (01/27/2019), 100 mg (01/28/2019), 100 mg (01/29/2019), 100 mg (01/30/2019), 100 mg (01/31/2019)  for chemotherapy treatment.      ALLERGIES:  is allergic to terbinafine.  MEDICATIONS:  Current Outpatient Medications  Medication Sig Dispense Refill  . acetaminophen (TYLENOL) 325 MG tablet Take 325 mg by mouth every 6 (six) hours as needed for mild pain or moderate pain.     Marland Kitchen acyclovir (ZOVIRAX) 400 MG tablet ONE PILL A DAY [TO PREVENT SHINGLES] 90 tablet 1  . atenolol (TENORMIN) 50 MG tablet Take 1 tablet by mouth 2 (two)  times daily.    . calcium carbonate (OSCAL) 1500 (600 Ca) MG TABS tablet Take 600 mg of elemental calcium by mouth daily with breakfast.    . Cholecalciferol (VITAMIN D) 2000 units tablet Take 1 tablet by mouth daily.    Marland Kitchen eltrombopag (PROMACTA) 50 MG tablet Take 1 tablet (50 mg total) by mouth daily. Take on an empty stomach 1 hour before a meal or 2 hours after (Patient not taking: Reported on 06/20/2019) 30 tablet 3  . Gilteritinib Fumarate (XOSPATA) 40 MG TABS Take 120 mg by mouth daily. 90 tablet 2  . heparin lock flush 100 UNIT/ML SOLN injection Inject into the vein.    . Infant Care Products (DERMACLOUD) CREA Apply topically.    Marland Kitchen KLOR-CON M20 20 MEQ tablet TAKE 1 TABLET BY MOUTH TWICE A DAY 180 tablet 0  . levofloxacin (LEVAQUIN) 250 MG tablet Take 1 tablet (250 mg total) by mouth daily. 30 tablet 3  . losartan-hydrochlorothiazide (HYZAAR) 50-12.5 MG tablet Take 1 tablet by mouth daily.    . magic mouthwash w/lidocaine SOLN Take 5 mLs by mouth 4 (four) times daily as needed for mouth pain. (Patient not taking: Reported on 06/20/2019) 480 mL 3  . magnesium hydroxide (MILK OF MAGNESIA) 400 MG/5ML suspension Take 5 mLs by mouth every 4 (four)  hours as needed.    . ondansetron (ZOFRAN) 8 MG tablet Take by mouth every 8 (eight) hours as needed for nausea or vomiting.    . polyethylene glycol (MIRALAX / GLYCOLAX) 17 g packet Take 17 g by mouth daily.    . posaconazole (NOXAFIL) 100 MG TBEC delayed-release tablet Take 1 tablet (100 mg total) by mouth daily. 30 tablet 3  . Potassium Chloride ER 20 MEQ TBCR Take 20 mEq by mouth daily. 60 tablet 1  . senna (SENOKOT) 8.6 MG tablet Take 2 tablets by mouth 2 (two) times daily.     No current facility-administered medications for this visit.   Facility-Administered Medications Ordered in Other Visits  Medication Dose Route Frequency Provider Last Rate Last Admin  . 0.9 %  sodium chloride infusion   Intravenous Continuous Jacquelin Hawking, NP   Stopped  at 02/05/19 1630  . acetaminophen (TYLENOL) tablet 650 mg  650 mg Oral Once Charlaine [facility] R, MD      . diphenhydrAMINE (BENADRYL) capsule 25 mg  25 mg Oral Once Charlaine [facility] R, MD      . heparin lock flush 100 unit/mL  250 Units Intravenous Once Cammie Sickle, MD        VITAL SIGNS: There were no vitals taken for this visit. There were no vitals filed for this visit.  Estimated body mass index is 21.03 kg/m as calculated from the following:   Height as of 06/17/19: '4\' 9"'$  (1.448 m).   Weight as of an earlier encounter on 06/23/19: 97 lb 3.2 oz (44.1 kg).  LABS: CBC:    Component Value Date/Time   WBC 3.4 (L) 06/23/2019 0820   HGB 9.0 (L) 06/23/2019 0820   HGB 7.1 (L) 11/25/2018 0904   HCT 29.2 (L) 06/23/2019 0820   PLT 5 (LL) 06/23/2019 0820   MCV 87.4 06/23/2019 0820   NEUTROABS 0.6 (L) 06/23/2019 0820   LYMPHSABS 0.8 06/23/2019 0820   MONOABS 1.0 06/23/2019 0820   EOSABS 0.0 06/23/2019 0820   BASOSABS 0.0 06/23/2019 0820   Comprehensive Metabolic Panel:    Component Value Date/Time   NA 137 06/23/2019 0820   K 3.6 06/23/2019 0820   CL 100 06/23/2019 0820   CO2 25 06/23/2019 0820   BUN 28 (H) 06/23/2019 0820   CREATININE 0.85 06/23/2019 0820   GLUCOSE 200 (H) 06/23/2019 0820   CALCIUM 9.1 06/23/2019 0820   AST 41 06/23/2019 0820   ALT 28 06/23/2019 0820   ALKPHOS 74 06/23/2019 0820   BILITOT 0.6 06/23/2019 0820   PROT 6.4 (L) 06/23/2019 0820   ALBUMIN 3.5 06/23/2019 0820    RADIOGRAPHIC STUDIES: CT BONE MARROW BIOPSY & ASPIRATION  Result Date: 06/17/2019 INDICATION: AML EXAM: CT GUIDED RIGHT ILIAC BONE MARROW ASPIRATION AND CORE BIOPSY Date:  06/17/2019 06/17/2019 10:21 am Radiologist:  M. Daryll Brod, MD Guidance:  CT FLUOROSCOPY TIME:  Fluoroscopy Time: None. MEDICATIONS: 1% lidocaine local ANESTHESIA/SEDATION: 1.0 mg IV Versed; 50 mcg IV Fentanyl Moderate Sedation Time:  12 minutes The patient was continuously monitored during the procedure by the  interventional radiology nurse under my direct supervision. CONTRAST:  None. COMPLICATIONS: None PROCEDURE: Informed consent was obtained from the patient following explanation of the procedure, risks, benefits and alternatives. The patient understands, agrees and consents for the procedure. All questions were addressed. A time out was performed. The patient was positioned prone and non-contrast localization CT was performed of the pelvis to demonstrate the iliac marrow spaces. Maximal barrier sterile technique  utilized including caps, mask, sterile gowns, sterile gloves, large sterile drape, hand hygiene, and Betadine prep. Under sterile conditions and local anesthesia, an 11 gauge coaxial bone biopsy needle was advanced into the right iliac marrow space. Needle position was confirmed with CT imaging. Initially, bone marrow aspiration was performed. Next, the 11 gauge outer cannula was utilized to obtain a right iliac bone marrow core biopsy. Needle was removed. Hemostasis was obtained with compression. The patient tolerated the procedure well. Samples were prepared with the cytotechnologist. No immediate complications. IMPRESSION: CT guided right iliac bone marrow aspiration and core biopsy. Electronically Signed   By: Jerilynn Mages.  Shick M.D.   On: 06/17/2019 10:33    PERFORMANCE STATUS (ECOG) : 1 - Symptomatic but completely ambulatory  Review of Systems Unless otherwise noted, a complete review of systems is negative.  Physical Exam General: NAD, frail appearing, thin Pulmonary: Unlabored Extremities: no edema, no joint deformities Skin: no rashes Neurological: Weakness but otherwise nonfocal  IMPRESSION: I met today with patient and husband in the clinic.  Introduced palliative care services and attempted establish therapeutic rapport.  Patient reports that she is doing reasonably well.  She has some fatigue but otherwise denies any distressing symptoms or recent decline.  Patient had a fall over the  summer but has had no issues since.  Her performance status is stable.  She is independent with her self-care at home.  Patient reports that she is coping well.  She denies any depression or anxiety.  She describes her husband and children as strong sources of support.  Both patient and husband seem to recognize the severity of her illness.  They both verbalized understanding that her AML will eventually prove terminal.  They are in agreement with the current scope of treatment.  Patient has previously established ACP documents, which are on file in [place]. She would want her husband to be her [facility] POA.  Patient says that she would not want to be resuscitated nor have her life prolonged artificially on machines.  I sent her home with a MOST Form to review, which can be completed at next clinic visit if she desires.  PLAN: -Continue current scope of treatment -ACP documents discussed -MOST Form reviewed -RTC in 1 to 2 weeks  Case and plan discussed with Dr. Rogue Bussing  Patient expressed understanding and was in agreement with this plan. She also understands that She can call the clinic at any time with any questions, concerns, or complaints.     Time Total: 30 minutes  Visit consisted of counseling and education dealing with the complex and emotionally intense issues of symptom management and palliative care in the setting of serious and potentially life-threatening illness.Greater than 50%  of this time was spent counseling and coordinating care related to the above assessment and plan.  Signed by: Altha Harm, PhD, NP-C

## 2019-06-23 NOTE — Telephone Encounter (Signed)
On 1/8-I spoke to patient at length the recommendations/discussion with Dr.Rizzeri.  Given partial response noted on the bone marrow biopsy options include #continued gilteritinib/increasing dose to 120 #2 switch to Vidaza-venetoclax.  After discussion pros and cons-family/patient interested in proceeding with gilteritinib at 120 mg starting on 1/08.   Follow-up as planned

## 2019-06-24 ENCOUNTER — Other Ambulatory Visit: Payer: Self-pay | Admitting: Internal Medicine

## 2019-06-24 ENCOUNTER — Encounter (HOSPITAL_COMMUNITY): Payer: Self-pay | Admitting: Internal Medicine

## 2019-06-24 LAB — BPAM PLATELET PHERESIS
Blood Product Expiration Date: 202101142359
ISSUE DATE / TIME: 202101111033
Unit Type and Rh: 5100

## 2019-06-24 LAB — PREPARE PLATELET PHERESIS: Unit division: 0

## 2019-06-24 LAB — SURGICAL PATHOLOGY

## 2019-06-27 ENCOUNTER — Inpatient Hospital Stay: Payer: Medicare Other | Admitting: *Deleted

## 2019-06-27 ENCOUNTER — Inpatient Hospital Stay: Payer: Medicare Other

## 2019-06-27 ENCOUNTER — Encounter: Payer: Self-pay | Admitting: Nurse Practitioner

## 2019-06-27 ENCOUNTER — Inpatient Hospital Stay (HOSPITAL_BASED_OUTPATIENT_CLINIC_OR_DEPARTMENT_OTHER): Payer: Medicare Other | Admitting: Nurse Practitioner

## 2019-06-27 ENCOUNTER — Other Ambulatory Visit: Payer: Self-pay | Admitting: *Deleted

## 2019-06-27 ENCOUNTER — Encounter: Payer: Self-pay | Admitting: Internal Medicine

## 2019-06-27 ENCOUNTER — Other Ambulatory Visit: Payer: Self-pay | Admitting: Nurse Practitioner

## 2019-06-27 ENCOUNTER — Other Ambulatory Visit: Payer: Self-pay

## 2019-06-27 ENCOUNTER — Ambulatory Visit
Admission: RE | Admit: 2019-06-27 | Discharge: 2019-06-27 | Disposition: A | Discharge status: Home / Self Care | Payer: Medicare Other | Source: Ambulatory Visit | Attending: Nurse Practitioner | Admitting: Nurse Practitioner

## 2019-06-27 DIAGNOSIS — K1121 Acute sialoadenitis: Secondary | ICD-10-CM

## 2019-06-27 DIAGNOSIS — R220 Localized swelling, mass and lump, head: Secondary | ICD-10-CM

## 2019-06-27 DIAGNOSIS — C9200 Acute myeloblastic leukemia, not having achieved remission: Secondary | ICD-10-CM

## 2019-06-27 DIAGNOSIS — Z452 Encounter for adjustment and management of vascular access device: Secondary | ICD-10-CM

## 2019-06-27 LAB — CBC WITH DIFFERENTIAL/PLATELET
Abs Immature Granulocytes: 0 10*3/uL (ref 0.00–0.07)
Basophils Absolute: 0 10*3/uL (ref 0.0–0.1)
Basophils Relative: 0 %
Blasts: 22 %
Eosinophils Absolute: 0.1 10*3/uL (ref 0.0–0.5)
Eosinophils Relative: 2 %
HCT: 26.2 % — ABNORMAL LOW (ref 36.0–46.0)
Hemoglobin: 8.3 g/dL — ABNORMAL LOW (ref 12.0–15.0)
Lymphocytes Relative: 18 %
Lymphs Abs: 1 10*3/uL (ref 0.7–4.0)
MCH: 27.2 pg (ref 26.0–34.0)
MCHC: 31.7 g/dL (ref 30.0–36.0)
MCV: 85.9 fL (ref 80.0–100.0)
Monocytes Absolute: 2 10*3/uL — ABNORMAL HIGH (ref 0.1–1.0)
Monocytes Relative: 37 %
Neutro Abs: 1.1 10*3/uL — ABNORMAL LOW (ref 1.7–7.7)
Neutrophils Relative %: 21 %
Platelets: 7 10*3/uL — CL (ref 150–400)
RBC: 3.05 MIL/uL — ABNORMAL LOW (ref 3.87–5.11)
RDW: 17.3 % — ABNORMAL HIGH (ref 11.5–15.5)
Smear Review: NORMAL
WBC: 5.3 10*3/uL (ref 4.0–10.5)
nRBC: 1.3 % — ABNORMAL HIGH (ref 0.0–0.2)

## 2019-06-27 LAB — SAMPLE TO BLOOD BANK

## 2019-06-27 MED ORDER — HEPARIN SOD (PORK) LOCK FLUSH 100 UNIT/ML IV SOLN
INTRAVENOUS | Status: AC
  Filled 2019-06-27: qty 5

## 2019-06-27 MED ORDER — HEPARIN SOD (PORK) LOCK FLUSH 100 UNIT/ML IV SOLN
500.0000 [IU] | Freq: Once | INTRAVENOUS | Status: AC
  Administered 2019-06-27: 500 [IU] via INTRAVENOUS
  Filled 2019-06-27: qty 5

## 2019-06-27 MED ORDER — AMOXICILLIN-POT CLAVULANATE 875-125 MG PO TABS: 1.0000 | ORAL_TABLET | Freq: Two times a day (BID) | ORAL | 0 refills | Status: AC

## 2019-06-27 MED ORDER — IOHEXOL 300 MG/ML  SOLN
75.0000 mL | Freq: Once | INTRAMUSCULAR | Status: AC | PRN
  Administered 2019-06-27: 75 mL via INTRAVENOUS

## 2019-06-27 MED ORDER — ACETAMINOPHEN 325 MG PO TABS
650.0000 mg | ORAL_TABLET | Freq: Once | ORAL | Status: AC
  Administered 2019-06-27: 650 mg via ORAL
  Filled 2019-06-27: qty 2

## 2019-06-27 MED ORDER — DIPHENHYDRAMINE HCL 25 MG PO CAPS: 25.0000 mg | ORAL_CAPSULE | Freq: Once | ORAL | Status: DC

## 2019-06-27 MED ORDER — SODIUM CHLORIDE 0.9% FLUSH
10.0000 mL | Freq: Once | INTRAVENOUS | Status: AC
  Administered 2019-06-27: 10 mL via INTRAVENOUS
  Filled 2019-06-27: qty 10

## 2019-06-27 MED ORDER — SODIUM CHLORIDE 0.9% IV SOLUTION
250.0000 mL | Freq: Once | INTRAVENOUS | Status: AC
  Administered 2019-06-27: 50 mL via INTRAVENOUS
  Filled 2019-06-27: qty 250

## 2019-06-27 NOTE — Patient Instructions (Signed)
Amoxicillin; Clavulanic Acid Tablets What is this medicine? AMOXICILLIN; CLAVULANIC ACID (a mox i SIL in; KLAV yoo lan ic AS id) is a penicillin antibiotic. It treats some infections caused by bacteria. It will not work for colds, the flu, or other viruses. This medicine may be used for other purposes; ask your health care provider or pharmacist if you have questions. COMMON BRAND NAME(S): Augmentin What should I tell my health care provider before I take this medicine? They need to know if you have any of these conditions:  bowel disease, like colitis  kidney disease  liver disease  mononucleosis  an unusual or allergic reaction to amoxicillin, penicillin, cephalosporin, other antibiotics, clavulanic acid, other medicines, foods, dyes, or preservatives  pregnant or trying to get pregnant  breast-feeding How should I use this medicine? Take this drug by mouth. Take it as directed on the prescription label at the same time every day. Take it with food at the start of a meal or snack. Take all of this drug unless your health care provider tells you to stop it early. Keep taking it even if you think you are better. Talk to your health care provider about the use of this drug in children. While it may be prescribed for selected conditions, precautions do apply. Overdosage: If you think you have taken too much of this medicine contact a poison control center or emergency room at once. NOTE: This medicine is only for you. Do not share this medicine with others. What if I miss a dose? If you miss a dose, take it as soon as you can. If it is almost time for your next dose, take only that dose. Do not take double or extra doses. What may interact with this medicine?  allopurinol  anticoagulants  birth control pills  methotrexate  probenecid This list may not describe all possible interactions. Give your health care provider a list of all the medicines, herbs, non-prescription drugs, or  dietary supplements you use. Also tell them if you smoke, drink alcohol, or use illegal drugs. Some items may interact with your medicine. What should I watch for while using this medicine? Tell your doctor or healthcare provider if your symptoms do not improve. This medicine may cause serious skin reactions. They can happen weeks to months after starting the medicine. Contact your healthcare provider right away if you notice fevers or flu-like symptoms with a rash. The rash may be red or purple and then turn into blisters or peeling of the skin. Or, you might notice a red rash with swelling of the face, lips or lymph nodes in your neck or under your arms. Do not treat diarrhea with over the counter products. Contact your doctor if you have diarrhea that lasts more than 2 days or if it is severe and watery. If you have diabetes, you may get a false-positive result for sugar in your urine. Check with your doctor or healthcare provider. Birth control pills may not work properly while you are taking this medicine. Talk to your doctor about using an extra method of birth control. What side effects may I notice from receiving this medicine? Side effects that you should report to your doctor or health care professional as soon as possible:  allergic reactions like skin rash, itching or hives, swelling of the face, lips, or tongue  breathing problems  dark urine  fever or chills, sore throat  redness, blistering, peeling, or loosening of the skin, including inside the mouth  seizures  mouth °· seizures °· trouble passing urine or change in the amount of urine °· unusual bleeding, bruising °· unusually weak or tired °· white patches or sores in the mouth or throat °Side effects that usually do not require medical attention (report to your doctor or health care professional if they continue or are bothersome): °· diarrhea °· dizziness °· headache °· nausea, vomiting °· stomach upset °· vaginal or anal  irritation °This list may not describe all possible side effects. Call your doctor for medical advice about side effects. You may report side effects to FDA at 1-800-FDA-1088. °Where should I keep my medicine? °Keep out of the reach of children and pets. °Store at room temperature between 20 and 25 degrees C (68 and 77 degrees F). Throw away any unused drug after the expiration date. °NOTE: This sheet is a summary. It may not cover all possible information. If you have questions about this medicine, talk to your doctor, pharmacist, or health care provider. °© 2020 [employer]/Gold Standard (2018-12-30 11:55:53) ° °

## 2019-06-27 NOTE — Progress Notes (Signed)
Kathryn Hahn expressed this morning she woke up around 4 am with left jaw pain. She took  Tylenol but she is still having some pain now. She has noticeable swelling that starts from behind her left ear, down to the left side of her chin. I also noticed some bleeding around her gum line. Dr. Rogue Bussing notified. Will have Lauren, NP come to assess patient before leaves today. Vital signs stable.

## 2019-06-27 NOTE — Progress Notes (Signed)
Symptom Management [facility]  Telephone:([phone] [phone]  Patient Care Team: Rusty Aus, MD as PCP - General (Internal Medicine)   Name of the patient: Kathryn Hahn  [id number]  March 24, 1934   Date of visit: 06/27/19  Diagnosis-relapsed acute myeloid leukemia  Chief complaint/ Reason for visit-facial swelling and jaw pain  Heme/Onc history:  Oncology History Overview Note  # SEP 2017- MYELOPROLIFERATIVE NEOPLASM- [WBC- 39; normal Hb/platelets] hypercellular bone marrow 90-95% proliferation of myeloid cells in various stages of maturation; relative erythroid hypoplasia and proliferation of atypical megakaryocytes; no increase in blasts; peripheral blood Bcr-Abl-NEG; Cytogenetics- WNL. NEG- Jak-2/MPL/CALR [place] limited- mild splenomegaly [~10cm; 463cm3]; OCT 2017- second opinion at [place]. Surveillance.   # With progressive leukocytosis we evaluated her a month ago and sent BM for exam. She has a progressive leukocytosis so hydrea 536m daily was started on 10/20/2016 and increased to 2gm daily then back down to one daily, then 5 days per week over time due to counts drop. Then we held her hydrea since 04/09/2018 due to continued declining Hb and Plt.s  BMB 06/24/18 showed markedly hypercellular BM 95% with myeloid hyperplasia and atypical megakaryocytic hyperplasia. No significant increase in blasts. 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 BCR / ABL negative, FISH normal. Flow Showed 2%CD34-positive myeloid blasts. Myeloid precursors with low side scatter. Pathogenic variants were detected in the ASXL1, CCND2, CUX1, and U2AF1 genes.  # March 2020- wbc- 14/ Hb 9.5/ platelets- 54.   ---------------------------------------------------   # June 8th 2020- Acute myeloid leukemia [peripheral blood flow cytometry;NGS- pending]- June 15th Vidaza [SQ 1-5 + Venatoclax- #1in pt]; tumor  lysis prophylaxis  # Day #28 bone marrow biopsy-  7/14- BMx- NO BLASTS; positive for dysplastic changes. OFF Venatoclax [since 7/17] sec to cytopenias.  #817 cycle #2 Vidaza+ venetoclax 100 mg; stop venetoclax on 8/24 [severe pancytopenia]   # 02/18/2019-start gilteritinib 80 mg a day; HELD on 9/14 [sec to severe pan]  # NOV 9th/2020-BMBx- RELAPSED AML; NOV 11th- STARTED GILTERITINIB 459mday; NOV17th 2020- PROMACTA 5089may; NOV 30th- inrease Gilt to 80 mg/day; DEC 31st-STOP Gilt/ Promatca [no reponse; JAN 5th BMBx- partial response; PROCEED with GILTERTINIB 120m79my.    # AUG 2020- AST elevation- 248-?sec to Posonoazole; RESOLVED; continue at lower dose 100 mg/day.   # 2002 [[place]] BREAST CA s/p Lumpect RT; [? Stage I] no chemo s/p AI  # F-ONE HEM: FLT3 ALTERATION NOTED  # JAN 2021- Palliative care evaluation.   DIAGNOSIS: ACUTE MYELOID LEUKEMIA GOALS: pallaitive  CURRENT/MOST RECENT THERAPY : Gilteritinib     MDS (myelodysplastic syndrome) (HCC) (Resolved)  08/28/2018 Initial Diagnosis   MDS (myelodysplastic syndrome) (HCC)   Acute myeloid leukemia not having achieved remission (HCC)[place]/15/2020 Initial Diagnosis   AML (acute myeloid leukemia) with failed remission (HCC)[place]6/15/2020 - 12/22/2018 Chemotherapy   The patient had azaCITIDine (VIDAZA) chemo injection 105 mg, 75 mg/m2 = 105 mg, Subcutaneous,  Once, 1 of 4 cycles Administration: 105 mg (11/25/2018), 105 mg (11/26/2018), 105 mg (11/27/2018), 105 mg (11/28/2018), 105 mg (11/29/2018)  for chemotherapy treatment.    01/27/2019 -  Chemotherapy   The patient had palonosetron (ALOXI) injection 0.25 mg, 0.25 mg, Intravenous,  Once, 1 of 4 cycles Administration: 0.25 mg (01/27/2019), 0.25 mg (01/29/2019), 0.25 mg (01/31/2019) azaCITIDine (VIDAZA) 100 mg in sodium chloride 0.9 % 50 mL chemo infusion, 75 mg/m2 = 100 mg, Intravenous, Once, 1 of 4 cycles Administration: 100 mg (01/27/2019), 100 mg (01/28/2019),  100 mg (01/29/2019), 100  mg (01/30/2019), 100 mg (01/31/2019)  for chemotherapy treatment.      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  She says that she was awoken from sleep at 4 this morning with pain behind her left ear.  She took Tylenol.  When she woke she noticed left face/cheek was swollen but pain had resolved.  She is receiving a platelet transfusion today and says pain has not returned but she notices that her face is swollen into her jaw, extending upwards to the left eye.  No fevers or chills.  No dental ulcerations or tooth pain.  No neck swelling or pain.  She continues prophylactic antibiotics, antifungals, and antivirals.  No history of bisphosphonates.  Review of systems- Review of Systems  Constitutional: Negative for chills, fever, malaise/fatigue and weight loss.  HENT: Negative for congestion, ear discharge, ear pain, hearing loss, nosebleeds, sinus pain, sore throat and tinnitus.   Eyes: Negative for blurred vision, double vision, photophobia, pain, discharge and redness.  Respiratory: Negative for stridor.   Musculoskeletal: Negative for falls.       No neck pain or swelling  Skin: Negative for itching and rash.  Neurological: Negative for dizziness, tingling, sensory change and headaches.  Endo/Heme/Allergies: Negative for environmental allergies. Bruises/bleeds easily.    Current treatment- gilteritinib  Allergies  Allergen Reactions  . Terbinafine Rash and Swelling    Past Medical History:  Diagnosis Date  . Anemia   . Arthritis   . Breast cancer ([place]) 2003   RT LUMPECTOMY  . Edema leg   . History of breast cancer 2003   post lumpectomy  . History of kidney stones   . Hyperlipemia, mixed   . Hypertension, essential   . Leukemia ([place])   . Leukocytosis   . MDS (myelodysplastic syndrome) ([place])   .  Personal history of radiation therapy 2003   BREAST CA  . Renal stones   . Vitamin D deficiency     Past Surgical History:  Procedure Laterality Date  . BREAST BIOPSY Right 2003   Postive for cancer  . BREAST LUMPECTOMY Right 2003   BREAST CA  . KIDNEY STONE SURGERY Right     Social History   Socioeconomic History  . Marital status: Married    Spouse name: Not on file  . Number of children: Not on file  . Years of education: Not on file  . Highest education level: Not on file  Occupational History  . Not on file  Tobacco Use  . Smoking status: Never Smoker  . Smokeless tobacco: Never Used  Substance and Sexual Activity  . Alcohol use: Yes    Alcohol/week: 3.0 standard drinks    Types: 3 Glasses of wine per week  . Drug use: No  . Sexual activity: Not on file  Other Topics Concern  . Not on file  Social History Narrative    No smoking/alcohol; lives in [place] with family.  She lives in assisted living with her husband.   Social Determinants of Health   Financial Resource Strain:   . Difficulty of Paying Living Expenses: Not on file  Food Insecurity:   . Worried About Charity fundraiser in the Last Year: Not on file  . Ran Out of Food in the Last Year: Not on file  Transportation Needs:   . Lack of Transportation (  Medical): Not on file  . Lack of Transportation (Non-Medical): Not on file  Physical Activity:   . Days of Exercise per Week: Not on file  . Minutes of Exercise per Session: Not on file  Stress:   . Feeling of Stress : Not on file  Social Connections:   . Frequency of Communication with Friends and Family: Not on file  . Frequency of Social Gatherings with Friends and Family: Not on file  . Attends Religious Services: Not on file  . Active Member of Clubs or Organizations: Not on file  . Attends Archivist Meetings: Not on file  . Marital Status: Not on file  Intimate Partner Violence:   . Fear of Current or Ex-Partner: Not on file    . Emotionally Abused: Not on file  . Physically Abused: Not on file  . Sexually Abused: Not on file    Family History  Problem Relation Age of Onset  . Stroke Mother   . Hypertension Father   . Stroke Father   . Breast cancer Neg Hx      Current Outpatient Medications:  .  acetaminophen (TYLENOL) 325 MG tablet, Take 325 mg by mouth every 6 (six) hours as needed for mild pain or moderate pain. , Disp: , Rfl:  .  acyclovir (ZOVIRAX) 400 MG tablet, ONE PILL A DAY [TO PREVENT SHINGLES], Disp: 90 tablet, Rfl: 1 .  atenolol (TENORMIN) 50 MG tablet, Take 1 tablet by mouth 2 (two) times daily., Disp: , Rfl:  .  calcium carbonate (OSCAL) 1500 (600 Ca) MG TABS tablet, Take 600 mg of elemental calcium by mouth daily with breakfast., Disp: , Rfl:  .  Cholecalciferol (VITAMIN D) 2000 units tablet, Take 1 tablet by mouth daily., Disp: , Rfl:  .  eltrombopag (PROMACTA) 50 MG tablet, Take 1 tablet (50 mg total) by mouth daily. Take on an empty stomach 1 hour before a meal or 2 hours after (Patient not taking: Reported on 06/20/2019), Disp: 30 tablet, Rfl: 3 .  Gilteritinib Fumarate (XOSPATA) 40 MG TABS, Take 120 mg by mouth daily., Disp: 90 tablet, Rfl: 2 .  heparin lock flush 100 UNIT/ML SOLN injection, Inject into the vein., Disp: , Rfl:  .  Infant Care Products (DERMACLOUD) CREA, Apply topically., Disp: , Rfl:  .  KLOR-CON M20 20 MEQ tablet, TAKE 1 TABLET BY MOUTH TWICE A DAY, Disp: 180 tablet, Rfl: 0 .  levofloxacin (LEVAQUIN) 250 MG tablet, Take 1 tablet (250 mg total) by mouth daily., Disp: 30 tablet, Rfl: 3 .  losartan-hydrochlorothiazide (HYZAAR) 50-12.5 MG tablet, Take 1 tablet by mouth daily., Disp: , Rfl:  .  magic mouthwash w/lidocaine SOLN, Take 5 mLs by mouth 4 (four) times daily as needed for mouth pain. (Patient not taking: Reported on 06/20/2019), Disp: 480 mL, Rfl: 3 .  magnesium hydroxide (MILK OF MAGNESIA) 400 MG/5ML suspension, Take 5 mLs by mouth every 4 (four) hours as needed., Disp:  , Rfl:  .  ondansetron (ZOFRAN) 8 MG tablet, Take by mouth every 8 (eight) hours as needed for nausea or vomiting., Disp: , Rfl:  .  polyethylene glycol (MIRALAX / GLYCOLAX) 17 g packet, Take 17 g by mouth daily., Disp: , Rfl:  .  posaconazole (NOXAFIL) 100 MG TBEC delayed-release tablet, Take 1 tablet (100 mg total) by mouth daily., Disp: 30 tablet, Rfl: 3 .  Potassium Chloride ER 20 MEQ TBCR, Take 20 mEq by mouth daily., Disp: 60 tablet, Rfl: 1 .  senna (SENOKOT)  8.6 MG tablet, Take 2 tablets by mouth 2 (two) times daily., Disp: , Rfl:  No current facility-administered medications for this visit.  Facility-Administered Medications Ordered in Other Visits:  .  0.9 %  sodium chloride infusion, , Intravenous, Continuous, Burns, Wandra Feinstein, NP, Stopped at 02/05/19 1630 .  diphenhydrAMINE (BENADRYL) capsule 25 mg, 25 mg, Oral, Once, Brahmanday, Govinda R, MD .  heparin lock flush 100 unit/mL, 250 Units, Intravenous, Once, Cammie Sickle, MD  Physical exam: There were no vitals filed for this visit. Physical Exam Constitutional:      General: She is not in acute distress. HENT:     Head:     Comments: Left sided facial swelling, lower jaw, extends to below left eye. No lip swelling. Slight neck redness. No swelling of neck. Nontender. Blood blister in mouth. No obvious dental ulerations, wounds. External ear normal. Hearing aid in place.  Neurological:     Mental Status: She is alert.      CMP Latest Ref Rng & Units 06/23/2019  Glucose 70 - 99 mg/dL 200(H)  BUN 8 - 23 mg/dL 28(H)  Creatinine 0.44 - 1.00 mg/dL 0.85  Sodium 135 - 145 mmol/L 137  Potassium 3.5 - 5.1 mmol/L 3.6  Chloride 98 - 111 mmol/L 100  CO2 22 - 32 mmol/L 25  Calcium 8.9 - 10.3 mg/dL 9.1  Total Protein 6.5 - 8.1 g/dL 6.4(L)  Total Bilirubin 0.3 - 1.2 mg/dL 0.6  Alkaline Phos 38 - 126 U/L 74  AST 15 - 41 U/L 41  ALT 0 - 44 U/L 28   CBC Latest Ref Rng & Units 06/27/2019  WBC 4.0 - 10.5 K/uL 5.3  Hemoglobin  12.0 - 15.0 g/dL 8.3(L)  Hematocrit 36.0 - 46.0 % 26.2(L)  Platelets 150 - 400 K/uL 7(LL)    No images are attached to the encounter.  CT BONE MARROW BIOPSY & ASPIRATION  Result Date: 06/17/2019 INDICATION: AML EXAM: CT GUIDED RIGHT ILIAC BONE MARROW ASPIRATION AND CORE BIOPSY Date:  06/17/2019 06/17/2019 10:21 am Radiologist:  M. Daryll Brod, MD Guidance:  CT FLUOROSCOPY TIME:  Fluoroscopy Time: None. MEDICATIONS: 1% lidocaine local ANESTHESIA/SEDATION: 1.0 mg IV Versed; 50 mcg IV Fentanyl Moderate Sedation Time:  12 minutes The patient was continuously monitored during the procedure by the interventional radiology nurse under my direct supervision. CONTRAST:  None. COMPLICATIONS: None PROCEDURE: Informed consent was obtained from the patient following explanation of the procedure, risks, benefits and alternatives. The patient understands, agrees and consents for the procedure. All questions were addressed. A time out was performed. The patient was positioned prone and non-contrast localization CT was performed of the pelvis to demonstrate the iliac marrow spaces. Maximal barrier sterile technique utilized including caps, mask, sterile gowns, sterile gloves, large sterile drape, hand hygiene, and Betadine prep. Under sterile conditions and local anesthesia, an 11 gauge coaxial bone biopsy needle was advanced into the right iliac marrow space. Needle position was confirmed with CT imaging. Initially, bone marrow aspiration was performed. Next, the 11 gauge outer cannula was utilized to obtain a right iliac bone marrow core biopsy. Needle was removed. Hemostasis was obtained with compression. The patient tolerated the procedure well. Samples were prepared with the cytotechnologist. No immediate complications. IMPRESSION: CT guided right iliac bone marrow aspiration and core biopsy. Electronically Signed   By: Jerilynn Mages.  Shick M.D.   On: 06/17/2019 10:33    Assessment and plan- Patient is a 86 y.o. female diagnosed  with AML who presents to symptom  management clinic for facial swelling.  This is a new problem.  Given patient's AML and pancytopenia she is at significantly high risk of infection.  Suspect that she may not be able to mount a significant immune response and concern for underlying infection or abscess.  Currently on antibacterial, antifungal, and antiviral prophylaxis.  No obvious source of infection. I discussed findings with Dr. Rogue Bussing who independently assessed patient and recommended CT scan to evaluate further. In anticipation of contrast use reviewed patient's most recent renal function which was acceptable.  CT was independently reviewed by myself and Dr. Rogue Bussing.  No evidence of mass or distinct abscess.  Left parotid gland inflamed and given history concern for underlying bacterial infection. Dr. Rogue Bussing recommends Augmentin [phone] twice daily for ten days. Hold levaquin while on augmentin. Side effects discussed with patient. Results and plan were discussed with patient and her husband.   Disposition: If symptoms worsen over weekend please call oncall physician or go to ER. Sepsis precautions discussed.  Otherwise, follow up with Dr. Rogue Bussing as scheduled next week.    Visit Diagnosis 1. Acute parotitis   2. Localized swelling, mass, and lump of head     Patient expressed understanding and was in agreement with this plan. She also understands that She can call clinic at any time with any questions, concerns, or complaints.   Thank you for allowing me to participate in the care of this very pleasant patient.   Beckey Rutter, DNP, AGNP-C [facility]

## 2019-06-28 LAB — BPAM PLATELET PHERESIS
Blood Product Expiration Date: 202101172359
ISSUE DATE / TIME: 202101151147
Unit Type and Rh: 7300

## 2019-06-28 LAB — PREPARE PLATELET PHERESIS: Unit division: 0

## 2019-06-30 ENCOUNTER — Inpatient Hospital Stay: Payer: Medicare Other | Admitting: *Deleted

## 2019-06-30 ENCOUNTER — Inpatient Hospital Stay: Payer: Medicare Other

## 2019-06-30 ENCOUNTER — Telehealth: Payer: Self-pay | Admitting: Internal Medicine

## 2019-06-30 ENCOUNTER — Other Ambulatory Visit: Payer: Self-pay

## 2019-06-30 ENCOUNTER — Inpatient Hospital Stay (HOSPITAL_BASED_OUTPATIENT_CLINIC_OR_DEPARTMENT_OTHER): Payer: Medicare Other | Admitting: Internal Medicine

## 2019-06-30 ENCOUNTER — Inpatient Hospital Stay (HOSPITAL_BASED_OUTPATIENT_CLINIC_OR_DEPARTMENT_OTHER): Payer: Medicare Other | Admitting: Hospice and Palliative Medicine

## 2019-06-30 ENCOUNTER — Other Ambulatory Visit: Payer: Self-pay | Admitting: *Deleted

## 2019-06-30 VITALS — BP 168/97 | HR 70 | Temp 97.2°F | Wt 97.6 lb

## 2019-06-30 DIAGNOSIS — Z7189 Other specified counseling: Secondary | ICD-10-CM

## 2019-06-30 DIAGNOSIS — C9200 Acute myeloblastic leukemia, not having achieved remission: Secondary | ICD-10-CM

## 2019-06-30 DIAGNOSIS — Z452 Encounter for adjustment and management of vascular access device: Secondary | ICD-10-CM

## 2019-06-30 DIAGNOSIS — Z515 Encounter for palliative care: Secondary | ICD-10-CM

## 2019-06-30 LAB — CBC WITH DIFFERENTIAL/PLATELET
Abs Immature Granulocytes: 0 10*3/uL (ref 0.00–0.07)
Basophils Absolute: 0 10*3/uL (ref 0.0–0.1)
Basophils Relative: 0 %
Blasts: 20 %
Eosinophils Absolute: 0 10*3/uL (ref 0.0–0.5)
Eosinophils Relative: 0 %
HCT: 25.7 % — ABNORMAL LOW (ref 36.0–46.0)
Hemoglobin: 7.9 g/dL — ABNORMAL LOW (ref 12.0–15.0)
Lymphocytes Relative: 33 %
Lymphs Abs: 1.3 10*3/uL (ref 0.7–4.0)
MCH: 26.2 pg (ref 26.0–34.0)
MCHC: 30.7 g/dL (ref 30.0–36.0)
MCV: 85.4 fL (ref 80.0–100.0)
Metamyelocytes Relative: 1 %
Monocytes Absolute: 1.2 10*3/uL — ABNORMAL HIGH (ref 0.1–1.0)
Monocytes Relative: 30 %
Neutro Abs: 0.6 10*3/uL — ABNORMAL LOW (ref 1.7–7.7)
Neutrophils Relative %: 16 %
Platelets: 8 10*3/uL — CL (ref 150–400)
RBC: 3.01 MIL/uL — ABNORMAL LOW (ref 3.87–5.11)
RDW: 17.4 % — ABNORMAL HIGH (ref 11.5–15.5)
Smear Review: DECREASED
WBC: 4 10*3/uL (ref 4.0–10.5)
nRBC: 2.5 % — ABNORMAL HIGH (ref 0.0–0.2)

## 2019-06-30 LAB — COMPREHENSIVE METABOLIC PANEL WITH GFR
ALT: 31 U/L (ref 0–44)
AST: 55 U/L — ABNORMAL HIGH (ref 15–41)
Albumin: 3.6 g/dL (ref 3.5–5.0)
Alkaline Phosphatase: 75 U/L (ref 38–126)
Anion gap: 12 (ref 5–15)
BUN: 34 mg/dL — ABNORMAL HIGH (ref 8–23)
CO2: 26 mmol/L (ref 22–32)
Calcium: 9.4 mg/dL (ref 8.9–10.3)
Chloride: 100 mmol/L (ref 98–111)
Creatinine, Ser: 1.08 mg/dL — ABNORMAL HIGH (ref 0.44–1.00)
GFR calc Af Amer: 55 mL/min — ABNORMAL LOW
GFR calc non Af Amer: 47 mL/min — ABNORMAL LOW
Glucose, Bld: 150 mg/dL — ABNORMAL HIGH (ref 70–99)
Potassium: 3.8 mmol/L (ref 3.5–5.1)
Sodium: 138 mmol/L (ref 135–145)
Total Bilirubin: 0.6 mg/dL (ref 0.3–1.2)
Total Protein: 6.3 g/dL — ABNORMAL LOW (ref 6.5–8.1)

## 2019-06-30 LAB — SAMPLE TO BLOOD BANK

## 2019-06-30 LAB — PREPARE RBC (CROSSMATCH)

## 2019-06-30 LAB — LACTATE DEHYDROGENASE: LDH: 1373 U/L — ABNORMAL HIGH (ref 98–192)

## 2019-06-30 MED ORDER — SODIUM CHLORIDE 0.9% IV SOLUTION
250.0000 mL | Freq: Once | INTRAVENOUS | Status: AC
  Administered 2019-06-30: 250 mL via INTRAVENOUS
  Filled 2019-06-30: qty 250

## 2019-06-30 MED ORDER — DIPHENHYDRAMINE HCL 25 MG PO CAPS
25.0000 mg | ORAL_CAPSULE | Freq: Once | ORAL | Status: DC
  Filled 2019-06-30: qty 1

## 2019-06-30 MED ORDER — HEPARIN SOD (PORK) LOCK FLUSH 100 UNIT/ML IV SOLN
500.0000 [IU] | Freq: Once | INTRAVENOUS | Status: AC
  Administered 2019-06-30: 500 [IU] via INTRAVENOUS
  Filled 2019-06-30: qty 5

## 2019-06-30 MED ORDER — HEPARIN SOD (PORK) LOCK FLUSH 100 UNIT/ML IV SOLN
500.0000 [IU] | Freq: Once | INTRAVENOUS | Status: DC
  Filled 2019-06-30: qty 5

## 2019-06-30 MED ORDER — ACETAMINOPHEN 325 MG PO TABS
650.0000 mg | ORAL_TABLET | Freq: Once | ORAL | Status: AC
  Administered 2019-06-30: 650 mg via ORAL
  Filled 2019-06-30: qty 2

## 2019-06-30 MED ORDER — SODIUM CHLORIDE 0.9% FLUSH
10.0000 mL | Freq: Once | INTRAVENOUS | Status: AC
  Administered 2019-06-30: 10 mL via INTRAVENOUS
  Filled 2019-06-30: qty 10

## 2019-06-30 MED FILL — XOSPATA 40 MG TABS: 40 | 30 days supply | Qty: 90 | Fill #0

## 2019-06-30 NOTE — Assessment & Plan Note (Addendum)
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 on gilteritinib 80 mg a day; January 5th 2021 bone marrow biopsy-approximately 27% blasts present-partial response.    # currently on Gilterinib 120 mg/day-tolerating well without any nausea vomiting or diarrhea.  Continue current dose at this time.  #Left parotitis-currently on Augmentin twice a day for the last 3 days-significant improvement noted.  Discussed that if worsening-patient will need IV antibiotics.  Patient switch over to Levaquin after patient Augmentin.  # Leucopenia/ anemia/thrombocytopenia-White count 4 ANC- 0.3; /hemoglobin 7.9 platelets 8:  secondary to underlying acute myeloid leukemia/Gilteritinib.  Proceed with platelet transfusion today/PRBC as needed.  #Patient s/p Covid vaccination-#1-1/05 [#2 on 1/28]; s/p pack; also units of platelets.   #Supportive care for AML-  -Continue Noxafil/posaconazole 171m and acyclovir 400 mg; HOLD-levaquin 250 mg while on Augmentin. Continue PICC line dressing changes weekly  #S/p palliative care evaluation-/Josh Borders- finished MOST form-patient DNR/DNI.  Discussed with Josh.  Disposition: #  Today-1 unit platelets today; 1 PRBC.   # 1/21 labs- hold tube'cbc; possible 1 unit platelets/1 unit PRBC transfusion  # Follow up- Jan 25th MD- labs-cbc/cmp;LDH; hold tube;possible transfusion  # 1/28th labs- hold tube'cbc; possible 1 unit platelets/1 unit PRBC transfusion-Dr.B

## 2019-06-30 NOTE — Telephone Encounter (Signed)
On 1/15- seen in infusion re: left jaw/face swelling; CT- parotitis; started on augumentin BID; discussed with Lauren.

## 2019-06-30 NOTE — Progress Notes (Signed)
[place]  [phone] [phone]   Name: Kathryn Hahn Date: 06/30/2019 MRN: [id number]  DOB: Jul 27, 1933  Patient Care Team: Rusty Aus, MD as PCP - General (Internal Medicine)    REASON FOR CONSULTATION: Kathryn Hahn is a 86 y.o. 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  Patient is transfusion dependent requiring weekly platelets.  Her overall prognosis is felt to be guarded and treatment is with palliative intent.  She was referred to palliative care to help address goals.  SOCIAL HISTORY:     reports that she has never smoked. She has never used smokeless tobacco. She reports current alcohol use of about 3.0 standard drinks of alcohol per week. She reports that she does not use drugs.   Patient is married and lives at home with her husband.  She has 5 daughters, one of whom lives in [place] and the others live out of state.  Patient worked as an Automotive engineer.  ADVANCE DIRECTIVES:  On file  CODE STATUS: DNR/DNI (MOST form completed on 06/30/19)  PAST MEDICAL HISTORY: Past Medical History:  Diagnosis Date  . Anemia   . Arthritis   . Breast cancer ([place]) 2003   RT LUMPECTOMY  . Edema leg   . History of breast cancer 2003   post lumpectomy  . History of kidney stones   . Hyperlipemia, mixed   . Hypertension, essential   . Leukemia ([place])   . Leukocytosis   . MDS (myelodysplastic syndrome) ([place])   . Personal history of radiation therapy 2003   BREAST CA  . Renal stones   . Vitamin D deficiency     PAST SURGICAL HISTORY:  Past Surgical History:  Procedure Laterality Date  . BREAST BIOPSY Right 2003   Postive for cancer  . BREAST LUMPECTOMY Right 2003   BREAST CA  . KIDNEY STONE SURGERY Right     HEMATOLOGY/ONCOLOGY HISTORY:  Oncology History Overview  Note  # SEP 2017- MYELOPROLIFERATIVE NEOPLASM- [WBC- 52; normal Hb/platelets] hypercellular bone marrow 90-95% proliferation of myeloid cells in various stages of maturation; relative erythroid hypoplasia and proliferation of atypical megakaryocytes; no increase in blasts; peripheral blood Bcr-Abl-NEG; Cytogenetics- WNL. NEG- Jak-2/MPL/CALR [place] limited- mild splenomegaly [~10cm; 463cm3]; OCT 2017- second opinion at [facility]. Surveillance.   # With progressive leukocytosis we evaluated her a month ago and sent BM for exam. She has a progressive leukocytosis so hydrea 525m daily was started on 10/20/2016 and increased to 2gm daily then back down to one daily, then 5 days per week over time due to counts drop. Then we held her hydrea since 04/09/2018 due to continued declining Hb and Plt.s  BMB 06/24/18 showed markedly hypercellular BM 95% with myeloid hyperplasia and atypical megakaryocytic hyperplasia. No significant increase in blasts. 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 BCR / ABL negative, FISH normal. Flow Showed 2%CD34-positive myeloid blasts. Myeloid precursors with low side scatter. Pathogenic variants were detected in the ASXL1, CCND2, CUX1, and U2AF1 genes.  # March 2020- wbc- 14/ Hb 9.5/ platelets- 54.   ---------------------------------------------------   # June 8th 2020- Acute myeloid leukemia [peripheral blood flow cytometry;NGS- pending]- June 15th Vidaza [SQ 1-5 + Venatoclax- #1in pt]; tumor lysis prophylaxis  # Day #28 bone marrow biopsy-  7/14- BMx- NO BLASTS; positive for dysplastic changes. OFF Venatoclax [since 7/17] sec to cytopenias.  #817 cycle #2  Vidaza+ venetoclax 100 mg; stop venetoclax on 8/24 [severe pancytopenia]   # 02/18/2019-start gilteritinib 80 mg a day; HELD on 9/14 [sec to severe pan]  # NOV 9th/2020-BMBx- RELAPSED AML; NOV 11th- STARTED GILTERITINIB 16m/day; NOV17th 2020- PROMACTA 520mday; NOV  30th- inrease Gilt to 80 mg/day; DEC 31st-STOP Gilt/ Promatca [no reponse; JAN 5th BMBx- partial response; PROCEED with GILTERTINIB 12034may.    # AUG 2020- AST elevation- 248-?sec to Posonoazole; RESOLVED; continue at lower dose 100 mg/day.   # 2002 [[place]] BREAST CA s/p Lumpect RT; [? Stage I] no chemo s/p AI  # F-ONE HEM: FLT3 ALTERATION NOTED  # JAN 2021- Palliative care evaluation.   DIAGNOSIS: ACUTE MYELOID LEUKEMIA GOALS: pallaitive  CURRENT/MOST RECENT THERAPY : Gilteritinib     MDS (myelodysplastic syndrome) ([facility]) (Resolved)  08/28/2018 Initial Diagnosis   MDS (myelodysplastic syndrome) ([facility])   Acute myeloid leukemia not having achieved remission ([facility]2020 Initial Diagnosis   AML (acute myeloid leukemia) with failed remission ([facility] 11/25/2018 - 12/22/2018 Chemotherapy   The patient had azaCITIDine (VIDAZA) chemo injection 105 mg, 75 mg/m2 = 105 mg, Subcutaneous,  Once, 1 of 4 cycles Administration: 105 mg (11/25/2018), 105 mg (11/26/2018), 105 mg (11/27/2018), 105 mg (11/28/2018), 105 mg (11/29/2018)  for chemotherapy treatment.    01/27/2019 -  Chemotherapy   The patient had palonosetron (ALOXI) injection 0.25 mg, 0.25 mg, Intravenous,  Once, 1 of 4 cycles Administration: 0.25 mg (01/27/2019), 0.25 mg (01/29/2019), 0.25 mg (01/31/2019) azaCITIDine (VIDAZA) 100 mg in sodium chloride 0.9 % 50 mL chemo infusion, 75 mg/m2 = 100 mg, Intravenous, Once, 1 of 4 cycles Administration: 100 mg (01/27/2019), 100 mg (01/28/2019), 100 mg (01/29/2019), 100 mg (01/30/2019), 100 mg (01/31/2019)  for chemotherapy treatment.      ALLERGIES:  is allergic to terbinafine.  MEDICATIONS:  Current Outpatient Medications  Medication Sig Dispense Refill  . acetaminophen (TYLENOL) 325 MG tablet Take 325 mg by mouth every 6 (six) hours as needed for mild pain or moderate pain.     . aMarland Kitchenyclovir (ZOVIRAX) 400 MG tablet ONE PILL A DAY [TO PREVENT SHINGLES] 90 tablet 1  . amoxicillin-clavulanate  (AUGMENTIN) 875-125 MG tablet Take 1 tablet by mouth 2 (two) times daily for 10 days. 20 tablet 0  . atenolol (TENORMIN) 50 MG tablet Take 1 tablet by mouth 2 (two) times daily.    . calcium carbonate (OSCAL) 1500 (600 Ca) MG TABS tablet Take 600 mg of elemental calcium by mouth daily with breakfast.    . Cholecalciferol (VITAMIN D) 2000 units tablet Take 1 tablet by mouth daily.    . eMarland Kitchentrombopag (PROMACTA) 50 MG tablet Take 1 tablet (50 mg total) by mouth daily. Take on an empty stomach 1 hour before a meal or 2 hours after (Patient not taking: Reported on 06/20/2019) 30 tablet 3  . Gilteritinib Fumarate (XOSPATA) 40 MG TABS Take 120 mg by mouth daily. 90 tablet 2  . heparin lock flush 100 UNIT/ML SOLN injection Inject into the vein.    . Infant Care Products (DERMACLOUD) CREA Apply topically.    . KMarland KitchenOR-CON M20 20 MEQ tablet TAKE 1 TABLET BY MOUTH TWICE A DAY 180 tablet 0  . levofloxacin (LEVAQUIN) 250 MG tablet Take 1 tablet (250 mg total) by mouth daily. 30 tablet 3  . losartan-hydrochlorothiazide (HYZAAR) 50-12.5 MG tablet Take 1 tablet by mouth daily.    . magic mouthwash w/lidocaine SOLN Take 5 mLs by mouth 4 (four) times daily as needed for mouth pain. (  Patient not taking: Reported on 06/20/2019) 480 mL 3  . magnesium hydroxide (MILK OF MAGNESIA) 400 MG/5ML suspension Take 5 mLs by mouth every 4 (four) hours as needed.    . ondansetron (ZOFRAN) 8 MG tablet Take by mouth every 8 (eight) hours as needed for nausea or vomiting.    . polyethylene glycol (MIRALAX / GLYCOLAX) 17 g packet Take 17 g by mouth daily.    . posaconazole (NOXAFIL) 100 MG TBEC delayed-release tablet Take 1 tablet (100 mg total) by mouth daily. 30 tablet 3  . Potassium Chloride ER 20 MEQ TBCR Take 20 mEq by mouth daily. 60 tablet 1  . senna (SENOKOT) 8.6 MG tablet Take 2 tablets by mouth 2 (two) times daily.     No current facility-administered medications for this visit.   Facility-Administered Medications Ordered in Other  Visits  Medication Dose Route Frequency Provider Last Rate Last Admin  . 0.9 %  sodium chloride infusion   Intravenous Continuous Jacquelin Hawking, NP   Stopped at 02/05/19 1630  . heparin lock flush 100 unit/mL  250 Units Intravenous Once Cammie Sickle, MD        VITAL SIGNS: There were no vitals taken for this visit. There were no vitals filed for this visit.  Estimated body mass index is 21.12 kg/m as calculated from the following:   Height as of 06/17/19: _0  (1.448 m).   Weight as of an earlier encounter on 06/30/19: 97 lb 9.6 oz (44.3 kg).  LABS: CBC:    Component Value Date/Time   WBC 4.0 06/30/2019 0853   HGB 7.9 (L) 06/30/2019 0853   HGB 7.1 (L) 11/25/2018 0904   HCT 25.7 (L) 06/30/2019 0853   PLT PENDING 06/30/2019 0853   MCV 85.4 06/30/2019 0853   NEUTROABS PENDING 06/30/2019 0853   LYMPHSABS PENDING 06/30/2019 0853   MONOABS PENDING 06/30/2019 0853   EOSABS PENDING 06/30/2019 0853   BASOSABS PENDING 06/30/2019 0853   Comprehensive Metabolic Panel:    Component Value Date/Time   NA 138 06/30/2019 0853   K 3.8 06/30/2019 0853   CL 100 06/30/2019 0853   CO2 26 06/30/2019 0853   BUN 34 (H) 06/30/2019 0853   CREATININE 1.08 (H) 06/30/2019 0853   GLUCOSE 150 (H) 06/30/2019 0853   CALCIUM 9.4 06/30/2019 0853   AST 55 (H) 06/30/2019 0853   ALT 31 06/30/2019 0853   ALKPHOS 75 06/30/2019 0853   BILITOT 0.6 06/30/2019 0853   PROT 6.3 (L) 06/30/2019 0853   ALBUMIN 3.6 06/30/2019 0853    RADIOGRAPHIC STUDIES: CT MAXILLOFACIAL W CONTRAST  Result Date: 06/27/2019 CLINICAL DATA:  Left-sided facial swelling. AML. Symptoms began today. EXAM: CT MAXILLOFACIAL WITH CONTRAST TECHNIQUE: Multidetector CT imaging of the maxillofacial structures was performed with intravenous contrast. Multiplanar CT image reconstructions were also generated. CONTRAST:  72m OMNIPAQUE IOHEXOL 300 MG/ML  SOLN COMPARISON:  01/09/2017 FINDINGS: Osseous: Normal. No evidence of active dental  or periodontal disease. Orbits: Normal Sinuses: Clear and normal. Soft tissues: Inflammatory swelling of the left parotid gland diffusely. No evidence of mass lesion or stone. Nonspecific acute parotitis is usually viral. Right parotid gland is normal. Both submandibular glands are normal. Limited intracranial: Normal Left jugular vein is not yet opacified. This is presumed represent an artifact of delayed filling rather than thrombosis. IMPRESSION: Enlarged and edematous left parotid gland consistent with acute left parotitis. No evidence of drainable abscess, mass or stone. Most common etiology is viral inflammation. Electronically Signed   By:  Nelson Chimes M.D.   On: 06/27/2019 14:30   CT BONE MARROW BIOPSY & ASPIRATION  Result Date: 06/17/2019 INDICATION: AML EXAM: CT GUIDED RIGHT ILIAC BONE MARROW ASPIRATION AND CORE BIOPSY Date:  06/17/2019 06/17/2019 10:21 am Radiologist:  M. Daryll Brod, MD Guidance:  CT FLUOROSCOPY TIME:  Fluoroscopy Time: None. MEDICATIONS: 1% lidocaine local ANESTHESIA/SEDATION: 1.0 mg IV Versed; 50 mcg IV Fentanyl Moderate Sedation Time:  12 minutes The patient was continuously monitored during the procedure by the interventional radiology nurse under my direct supervision. CONTRAST:  None. COMPLICATIONS: None PROCEDURE: Informed consent was obtained from the patient following explanation of the procedure, risks, benefits and alternatives. The patient understands, agrees and consents for the procedure. All questions were addressed. A time out was performed. The patient was positioned prone and non-contrast localization CT was performed of the pelvis to demonstrate the iliac marrow spaces. Maximal barrier sterile technique utilized including caps, mask, sterile gowns, sterile gloves, large sterile drape, hand hygiene, and Betadine prep. Under sterile conditions and local anesthesia, an 11 gauge coaxial bone biopsy needle was advanced into the right iliac marrow space. Needle position was  confirmed with CT imaging. Initially, bone marrow aspiration was performed. Next, the 11 gauge outer cannula was utilized to obtain a right iliac bone marrow core biopsy. Needle was removed. Hemostasis was obtained with compression. The patient tolerated the procedure well. Samples were prepared with the cytotechnologist. No immediate complications. IMPRESSION: CT guided right iliac bone marrow aspiration and core biopsy. Electronically Signed   By: Jerilynn Mages.  Shick M.D.   On: 06/17/2019 10:33    PERFORMANCE STATUS (ECOG) : 1 - Symptomatic but completely ambulatory  Review of Systems Unless otherwise noted, a complete review of systems is negative.  Physical Exam General: NAD, frail appearing, thin Pulmonary: Unlabored Extremities: no edema, no joint deformities Skin: no rashes Neurological: Weakness but otherwise nonfocal  IMPRESSION: Follow-up visit today.  Patient is accompanied by her husband.  Patient reports doing well.  She denies any significant changes or concerns since last seen.  No distressing symptoms reported today.  Patient has been wanted to readdress advance care planning.  They brought in a copy of patient's living will.  Patient says that she would not want to be resuscitated or have her life prolonged artificially machines.  She would want to be a DNR/DNI.  She would be okay with short-term hospitalization if needed to treat the treatable.   I completed a MOST form today. The patient and family outlined their wishes for the following treatment decisions:  Cardiopulmonary Resuscitation: Do Not Attempt Resuscitation (DNR/No CPR)  Medical Interventions: Limited Additional Interventions: Use medical treatment, IV fluids and cardiac monitoring as indicated, DO NOT USE intubation or mechanical ventilation. May consider use of less invasive airway support such as BiPAP or CPAP. Also provide comfort measures. Transfer to the hospital if indicated. Avoid intensive care.     Antibiotics: Antibiotics if indicated  IV Fluids: IV fluids if indicated  Feeding Tube: No feeding tube   PLAN: -Continue current scope of treatment -DNR/DNI -MOST form completed -Follow-up telephone visit in 3 to 4 weeks.  Case and plan discussed with Dr. Rogue Bussing  Patient expressed understanding and was in agreement with this plan. She also understands that She can call the clinic at any time with any questions, concerns, or complaints.     Time Total: 20 minutes  Visit consisted of counseling and education dealing with the complex and emotionally intense issues of symptom management and palliative  care in the setting of serious and potentially life-threatening illness.Greater than 50%  of this time was spent counseling and coordinating care related to the above assessment and plan.  Signed by: Altha Harm, PhD, NP-C

## 2019-06-30 NOTE — Progress Notes (Signed)
[facility] OFFICE PROGRESS NOTE  Patient Care Team: Rusty Aus, MD as PCP - General (Internal Medicine)  CHIEF COMPLAINTS/PURPOSE OF CONSULTATION: Acute myeloid leukemia   Oncology History Overview Note  # SEP 2017- MYELOPROLIFERATIVE NEOPLASM- [WBC- 24; normal Hb/platelets] hypercellular bone marrow 90-95% proliferation of myeloid cells in various stages of maturation; relative erythroid hypoplasia and proliferation of atypical megakaryocytes; no increase in blasts; peripheral blood Bcr-Abl-NEG; Cytogenetics- WNL. NEG- Jak-2/MPL/CALR [place] limited- mild splenomegaly [~10cm; 463cm3]; OCT 2017- second opinion at [facility]. Surveillance.   # With progressive leukocytosis we evaluated her a month ago and sent BM for exam. She has a progressive leukocytosis so hydrea 576m daily was started on 10/20/2016 and increased to 2gm daily then back down to one daily, then 5 days per week over time due to counts drop. Then we held her hydrea since 04/09/2018 due to continued declining Hb and Plt.s  BMB 06/24/18 showed markedly hypercellular BM 95% with myeloid hyperplasia and atypical megakaryocytic hyperplasia. No significant increase in blasts. 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 BCR / ABL negative, FISH normal. Flow Showed 2%CD34-positive myeloid blasts. Myeloid precursors with low side scatter. Pathogenic variants were detected in the ASXL1, CCND2, CUX1, and U2AF1 genes.  # March 2020- wbc- 14/ Hb 9.5/ platelets- 54.   ---------------------------------------------------   # June 8th 2020- Acute myeloid leukemia [peripheral blood flow cytometry;NGS- pending]- June 15th Vidaza [SQ 1-5 + Venatoclax- #1in pt]; tumor lysis prophylaxis  # Day #28 bone marrow biopsy-  7/14- BMx- NO BLASTS; positive for dysplastic changes. OFF Venatoclax [since 7/17] sec to cytopenias.  #817 cycle #2 Vidaza+ venetoclax 100 mg; stop venetoclax on  8/24 [severe pancytopenia]   # 02/18/2019-start gilteritinib 80 mg a day; HELD on 9/14 [sec to severe pan]  # NOV 9th/2020-BMBx- RELAPSED AML; NOV 11th- STARTED GILTERITINIB 424mday; NOV17th 2020- PROMACTA 5067may; NOV 30th- inrease Gilt to 80 mg/day; DEC 31st-STOP Gilt/ Promatca [no reponse; JAN 5th BMBx- partial response; JAN 8th 2021-PROCEED with GILTERTINIB 120m6my.    # AUG 2020- AST elevation- 248-?sec to Posonoazole; RESOLVED; continue at lower dose 100 mg/day.   # 2002 [[place]] BREAST CA s/p Lumpect RT; [? Stage I] no chemo s/p AI  # F-ONE HEM: FLT3 ALTERATION NOTED  # JAN 2021- 1/18-Palliative care evaluation.   DIAGNOSIS: ACUTE MYELOID LEUKEMIA GOALS: pallaitive  CURRENT/MOST RECENT THERAPY : Gilteritinib     MDS (myelodysplastic syndrome) (HCC) (Resolved)  08/28/2018 Initial Diagnosis   MDS (myelodysplastic syndrome) (HCC)   Acute myeloid leukemia not having achieved remission (HCC)[place]/15/2020 Initial Diagnosis   AML (acute myeloid leukemia) with failed remission (HCC)[place]6/15/2020 - 12/22/2018 Chemotherapy   The patient had azaCITIDine (VIDAZA) chemo injection 105 mg, 75 mg/m2 = 105 mg, Subcutaneous,  Once, 1 of 4 cycles Administration: 105 mg (11/25/2018), 105 mg (11/26/2018), 105 mg (11/27/2018), 105 mg (11/28/2018), 105 mg (11/29/2018)  for chemotherapy treatment.    01/27/2019 -  Chemotherapy   The patient had palonosetron (ALOXI) injection 0.25 mg, 0.25 mg, Intravenous,  Once, 1 of 4 cycles Administration: 0.25 mg (01/27/2019), 0.25 mg (01/29/2019), 0.25 mg (01/31/2019) azaCITIDine (VIDAZA) 100 mg in sodium chloride 0.9 % 50 mL chemo infusion, 75 mg/m2 = 100 mg, Intravenous, Once, 1 of 4 cycles Administration: 100 mg (01/27/2019), 100 mg (01/28/2019), 100 mg (01/29/2019), 100 mg (01/30/2019), 100 mg (01/31/2019)  for chemotherapy treatment.      HISTORY OF PRESENTING ILLNESS:  Kathryn Hahn 85 y83.  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  Patient noted to have a swelling of the left jaw 4 days ago.  CT scan showed parotitis/no evidence of any abscess.  Patient started on Augmentin.  Noted to have improvement of the neck swelling/facial swelling.  Continues to have easy bruising.  Otherwise no blood in stools or black or stools.  No swelling in the legs.  No nausea vomiting diarrhea.  No fevers or chills.   Review of Systems  Constitutional: Positive for malaise/fatigue. Negative for chills, diaphoresis, fever and weight loss.  HENT: Negative for nosebleeds and sore throat.   Eyes: Negative for double vision.  Respiratory: Negative for cough, hemoptysis, sputum production and wheezing.   Cardiovascular: Negative for chest pain, palpitations and orthopnea.  Gastrointestinal: Negative for abdominal pain, blood in stool, constipation, diarrhea, heartburn, melena, nausea and vomiting.  Genitourinary: Negative for dysuria, frequency and urgency.  Musculoskeletal: Positive for back pain. Negative for joint pain.  Skin: Negative.  Negative for itching and rash.  Neurological: Negative for dizziness, tingling, focal weakness, weakness and headaches.  Endo/Heme/Allergies: Bruises/bleeds easily.  Psychiatric/Behavioral: Negative for depression. The patient is not nervous/anxious and does not have insomnia.     MEDICAL HISTORY:  Past Medical History:  Diagnosis Date  . Anemia   . Arthritis   . Breast cancer ([place]) 2003   RT LUMPECTOMY  . Edema leg   . History of breast cancer 2003   post lumpectomy  . History of kidney stones   . Hyperlipemia, mixed   . Hypertension, essential   . Leukemia ([place])   . Leukocytosis   . MDS (myelodysplastic syndrome) ([place])   . Personal history of radiation therapy 2003   BREAST CA  . Renal stones   . Vitamin D deficiency     SURGICAL HISTORY: Past Surgical History:  Procedure Laterality Date  .  BREAST BIOPSY Right 2003   Postive for cancer  . BREAST LUMPECTOMY Right 2003   BREAST CA  . KIDNEY STONE SURGERY Right     SOCIAL HISTORY: Social History   Socioeconomic History  . Marital status: Married    Spouse name: Not on file  . Number of children: Not on file  . Years of education: Not on file  . Highest education level: Not on file  Occupational History  . Not on file  Tobacco Use  . Smoking status: Never Smoker  . Smokeless tobacco: Never Used  Substance and Sexual Activity  . Alcohol use: Yes    Alcohol/week: 3.0 standard drinks    Types: 3 Glasses of wine per week  . Drug use: No  . Sexual activity: Not on file  Other Topics Concern  . Not on file  Social History Narrative    No smoking/alcohol; lives in [place] with family.  She lives in assisted living with her husband.   Social Determinants of Health   Financial Resource Strain:   . Difficulty of Paying Living Expenses: Not on file  Food Insecurity:   . Worried About Charity fundraiser in the Last Year: Not on file  . Ran Out of Food in the Last Year: Not on file  Transportation Needs:   . Lack of Transportation (Medical): Not on file  . Lack of Transportation (Non-Medical): Not on file  Physical Activity:   . Days of Exercise per Week: Not on file  . Minutes of Exercise per Session: Not on file  Stress:   . Feeling of Stress : Not on file  Social Connections:   . Frequency of Communication with Friends and Family: Not on file  . Frequency of Social Gatherings with Friends and Family: Not on file  . Attends Religious Services: Not on file  . Active Member of Clubs or Organizations: Not on file  . Attends Archivist Meetings: Not on file  . Marital Status: Not on file  Intimate Partner Violence:   . Fear of Current or Ex-Partner: Not on file  . Emotionally Abused: Not on file  . Physically Abused: Not on file  . Sexually Abused: Not on file    FAMILY HISTORY: Family History   Problem Relation Age of Onset  . Stroke Mother   . Hypertension Father   . Stroke Father   . Breast cancer Neg Hx     ALLERGIES:  is allergic to terbinafine.  MEDICATIONS:  Current Outpatient Medications  Medication Sig Dispense Refill  . acetaminophen (TYLENOL) 325 MG tablet Take 325 mg by mouth every 6 (six) hours as needed for mild pain or moderate pain.     Marland Kitchen acyclovir (ZOVIRAX) 400 MG tablet ONE PILL A DAY [TO PREVENT SHINGLES] 90 tablet 1  . atenolol (TENORMIN) 50 MG tablet Take 1 tablet by mouth 2 (two) times daily.    . calcium carbonate (OSCAL) 1500 (600 Ca) MG TABS tablet Take 600 mg of elemental calcium by mouth daily with breakfast.    . Cholecalciferol (VITAMIN D) 2000 units tablet Take 1 tablet by mouth daily.    . Gilteritinib Fumarate (XOSPATA) 40 MG TABS Take 120 mg by mouth daily. 90 tablet 2  . heparin lock flush 100 UNIT/ML SOLN injection Inject into the vein.    . Infant Care Products (DERMACLOUD) CREA Apply topically.    Marland Kitchen KLOR-CON M20 20 MEQ tablet TAKE 1 TABLET BY MOUTH TWICE A DAY 180 tablet 0  . levofloxacin (LEVAQUIN) 250 MG tablet Take 1 tablet (250 mg total) by mouth daily. 30 tablet 3  . losartan-hydrochlorothiazide (HYZAAR) 50-12.5 MG tablet Take 1 tablet by mouth daily.    . magnesium hydroxide (MILK OF MAGNESIA) 400 MG/5ML suspension Take 5 mLs by mouth every 4 (four) hours as needed.    . ondansetron (ZOFRAN) 8 MG tablet Take by mouth every 8 (eight) hours as needed for nausea or vomiting.    . polyethylene glycol (MIRALAX / GLYCOLAX) 17 g packet Take 17 g by mouth daily.    . posaconazole (NOXAFIL) 100 MG TBEC delayed-release tablet Take 1 tablet (100 mg total) by mouth daily. 30 tablet 3  . Potassium Chloride ER 20 MEQ TBCR Take 20 mEq by mouth daily. 60 tablet 1  . senna (SENOKOT) 8.6 MG tablet Take 2 tablets by mouth 2 (two) times daily.    Marland Kitchen amoxicillin-clavulanate (AUGMENTIN) 875-125 MG tablet Take 1 tablet by mouth 2 (two) times daily for 10  days. 20 tablet 0  . eltrombopag (PROMACTA) 50 MG tablet Take 1 tablet (50 mg total) by mouth daily. Take on an empty stomach 1 hour before a meal or 2 hours after (Patient not taking: Reported on 06/20/2019) 30 tablet 3  . magic mouthwash w/lidocaine SOLN Take 5 mLs by mouth 4 (four) times daily as needed for mouth pain. (Patient not taking: Reported on 06/20/2019) 480 mL 3   No current facility-administered medications for this visit.   Facility-Administered Medications Ordered in Other Visits  Medication Dose Route Frequency Provider Last Rate  Last Admin  . 0.9 %  sodium chloride infusion   Intravenous Continuous Jacquelin Hawking, NP   Stopped at 02/05/19 1630  . heparin lock flush 100 unit/mL  250 Units Intravenous Once Cammie Sickle, MD        PHYSICAL EXAMINATION: ECOG PERFORMANCE STATUS: 1 - Symptomatic but completely ambulatory  Vitals:   06/30/19 0909  BP: (!) 168/97  Pulse: 70  Temp: (!) 97.2 F (36.2 C)   Filed Weights   06/30/19 0909  Weight: 97 lb 9.6 oz (44.3 kg)    Physical Exam  Constitutional: She is oriented to person, place, and time and well-developed, well-nourished, and in no distress.  PICC line in place.  No active bleeding.  Masked.  Walking herself.  Accompanied by husband.  HENT:  Head: Normocephalic and atraumatic.  Mouth/Throat: Oropharynx is clear and moist. No oropharyngeal exudate.  Ecchymosis noted in the buccal mucosa.  Eyes: Conjunctivae are normal. No scleral icterus.  Cardiovascular: Normal rate and regular rhythm.  Pulmonary/Chest: Effort normal. No respiratory distress.  Abdominal: Soft. Bowel sounds are normal. She exhibits no distension and no mass. There is no abdominal tenderness. There is no rebound and no guarding.  Musculoskeletal:        General: No tenderness or edema. Normal range of motion.     Cervical back: Normal range of motion and neck supple.  Neurological: She is alert and oriented to person, place, and time.   Skin: Skin is warm.  Multiple bruises on lower legs, few on hands and arms. Torso bruising improved/mostly resolved  Psychiatric: Affect normal.    LABORATORY DATA:  I have reviewed the data as listed Lab Results  Component Value Date   WBC 4.0 06/30/2019   HGB 7.9 (L) 06/30/2019   HCT 25.7 (L) 06/30/2019   MCV 85.4 06/30/2019   PLT 8 (LL) 06/30/2019   Recent Labs    06/09/19 0844 06/09/19 0844 06/16/19 0830 06/23/19 0820 06/30/19 0853  NA 137   < > 137 137 138  K 3.5   < > 4.0 3.6 3.8  CL 100   < > 101 100 100  CO2 27   < > '25 25 26  '$ GLUCOSE 166*   < > 135* 200* 150*  BUN 26*   < > 31* 28* 34*  CREATININE 0.86   < > 0.85 0.85 1.08*  CALCIUM 9.1   < > 9.0 9.1 9.4  GFRNONAA >60   < > >60 >60 47*  GFRAA >60   < > >60 >60 55*  PROT 6.1*  --   --  6.4* 6.3*  ALBUMIN 3.4*  --   --  3.5 3.6  AST 55*  --   --  41 55*  ALT 29  --   --  28 31  ALKPHOS 73  --   --  74 75  BILITOT 0.4  --   --  0.6 0.6   < > = values in this interval not displayed.    RADIOGRAPHIC STUDIES: I have personally reviewed the radiological images as listed and agreed with the findings in the report. CT MAXILLOFACIAL W CONTRAST  Result Date: 06/27/2019 CLINICAL DATA:  Left-sided facial swelling. AML. Symptoms began today. EXAM: CT MAXILLOFACIAL WITH CONTRAST TECHNIQUE: Multidetector CT imaging of the maxillofacial structures was performed with intravenous contrast. Multiplanar CT image reconstructions were also generated. CONTRAST:  82m OMNIPAQUE IOHEXOL 300 MG/ML  SOLN COMPARISON:  01/09/2017 FINDINGS: Osseous: Normal. No evidence of active dental  or periodontal disease. Orbits: Normal Sinuses: Clear and normal. Soft tissues: Inflammatory swelling of the left parotid gland diffusely. No evidence of mass lesion or stone. Nonspecific acute parotitis is usually viral. Right parotid gland is normal. Both submandibular glands are normal. Limited intracranial: Normal Left jugular vein is not yet opacified.  This is presumed represent an artifact of delayed filling rather than thrombosis. IMPRESSION: Enlarged and edematous left parotid gland consistent with acute left parotitis. No evidence of drainable abscess, mass or stone. Most common etiology is viral inflammation. Electronically Signed   By: Nelson Chimes M.D.   On: 06/27/2019 14:30   CT BONE MARROW BIOPSY & ASPIRATION  Result Date: 06/17/2019 INDICATION: AML EXAM: CT GUIDED RIGHT ILIAC BONE MARROW ASPIRATION AND CORE BIOPSY Date:  06/17/2019 06/17/2019 10:21 am Radiologist:  M. Daryll Brod, MD Guidance:  CT FLUOROSCOPY TIME:  Fluoroscopy Time: None. MEDICATIONS: 1% lidocaine local ANESTHESIA/SEDATION: 1.0 mg IV Versed; 50 mcg IV Fentanyl Moderate Sedation Time:  12 minutes The patient was continuously monitored during the procedure by the interventional radiology nurse under my direct supervision. CONTRAST:  None. COMPLICATIONS: None PROCEDURE: Informed consent was obtained from the patient following explanation of the procedure, risks, benefits and alternatives. The patient understands, agrees and consents for the procedure. All questions were addressed. A time out was performed. The patient was positioned prone and non-contrast localization CT was performed of the pelvis to demonstrate the iliac marrow spaces. Maximal barrier sterile technique utilized including caps, mask, sterile gowns, sterile gloves, large sterile drape, hand hygiene, and Betadine prep. Under sterile conditions and local anesthesia, an 11 gauge coaxial bone biopsy needle was advanced into the right iliac marrow space. Needle position was confirmed with CT imaging. Initially, bone marrow aspiration was performed. Next, the 11 gauge outer cannula was utilized to obtain a right iliac bone marrow core biopsy. Needle was removed. Hemostasis was obtained with compression. The patient tolerated the procedure well. Samples were prepared with the cytotechnologist. No immediate complications.  IMPRESSION: CT guided right iliac bone marrow aspiration and core biopsy. Electronically Signed   By: Jerilynn Mages.  Shick M.D.   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 on gilteritinib 80 mg a day; January 5th 2021 bone marrow biopsy-approximately 27% blasts present-partial response.    # currently on Gilterinib 120 mg/day-tolerating well without any nausea vomiting or diarrhea.  Continue current dose at this time.  #Left parotitis-currently on Augmentin twice a day for the last 3 days-significant improvement noted.  Discussed that if worsening-patient will need IV antibiotics.  Patient switch over to Levaquin after patient Augmentin.  # Leucopenia/ anemia/thrombocytopenia-White count 4 ANC- 0.3; /hemoglobin 7.9 platelets 8:  secondary to underlying acute myeloid leukemia/Gilteritinib.  Proceed with platelet transfusion today/PRBC as needed.  #Patient s/p Covid vaccination-#1-1/05 [#2 on 1/28]; s/p pack; also units of platelets.   #Supportive care for AML-  -Continue Noxafil/posaconazole '100mg'$  and acyclovir 400 mg; HOLD-levaquin 250 mg while on Augmentin. Continue PICC line dressing changes weekly  #S/p palliative care evaluation-/Josh Borders- finished MOST form-patient DNR/DNI.  Discussed with Josh.  Disposition: #  Today-1 unit platelets today; 1 PRBC.   # 1/21 labs- hold tube'cbc; possible 1 unit platelets/1 unit PRBC transfusion  # Follow up- Jan 25th MD- labs-cbc/cmp;LDH; hold tube;possible transfusion  # 1/28th labs- hold tube'cbc; possible 1 unit platelets/1 unit  PRBC transfusion-Dr.B

## 2019-07-01 LAB — TYPE AND SCREEN
ABO/RH(D): O POS
Antibody Screen: NEGATIVE
Unit division: 0

## 2019-07-01 LAB — PREPARE PLATELET PHERESIS: Unit division: 0

## 2019-07-01 LAB — BPAM RBC
Blood Product Expiration Date: 202101262359
ISSUE DATE / TIME: 202101181139
Unit Type and Rh: 9500

## 2019-07-01 LAB — BPAM PLATELET PHERESIS
Blood Product Expiration Date: 202101202359
ISSUE DATE / TIME: 202101181405
Unit Type and Rh: 6200

## 2019-07-03 ENCOUNTER — Other Ambulatory Visit: Payer: Self-pay | Admitting: *Deleted

## 2019-07-03 ENCOUNTER — Inpatient Hospital Stay: Payer: Medicare Other

## 2019-07-03 ENCOUNTER — Inpatient Hospital Stay: Payer: Medicare Other | Admitting: *Deleted

## 2019-07-03 ENCOUNTER — Other Ambulatory Visit: Payer: Self-pay

## 2019-07-03 VITALS — BP 161/80 | HR 66 | Temp 97.9°F | Resp 20

## 2019-07-03 DIAGNOSIS — C9200 Acute myeloblastic leukemia, not having achieved remission: Secondary | ICD-10-CM

## 2019-07-03 DIAGNOSIS — Z452 Encounter for adjustment and management of vascular access device: Secondary | ICD-10-CM

## 2019-07-03 DIAGNOSIS — D469 Myelodysplastic syndrome, unspecified: Secondary | ICD-10-CM

## 2019-07-03 LAB — CBC WITH DIFFERENTIAL/PLATELET
Abs Immature Granulocytes: 0 10*3/uL (ref 0.00–0.07)
Basophils Absolute: 0 10*3/uL (ref 0.0–0.1)
Basophils Relative: 0 %
Blasts: 15 %
Eosinophils Absolute: 0 10*3/uL (ref 0.0–0.5)
Eosinophils Relative: 0 %
HCT: 28.9 % — ABNORMAL LOW (ref 36.0–46.0)
Hemoglobin: 9 g/dL — ABNORMAL LOW (ref 12.0–15.0)
Lymphocytes Relative: 37 %
Lymphs Abs: 1.4 10*3/uL (ref 0.7–4.0)
MCH: 27 pg (ref 26.0–34.0)
MCHC: 31.1 g/dL (ref 30.0–36.0)
MCV: 86.8 fL (ref 80.0–100.0)
Monocytes Absolute: 1.1 10*3/uL — ABNORMAL HIGH (ref 0.1–1.0)
Monocytes Relative: 29 %
Neutro Abs: 0.7 10*3/uL — ABNORMAL LOW (ref 1.7–7.7)
Neutrophils Relative %: 19 %
Platelets: 7 10*3/uL — CL (ref 150–400)
RBC: 3.33 MIL/uL — ABNORMAL LOW (ref 3.87–5.11)
RDW: 17.2 % — ABNORMAL HIGH (ref 11.5–15.5)
Smear Review: DECREASED
WBC: 3.9 10*3/uL — ABNORMAL LOW (ref 4.0–10.5)
nRBC: 3.3 % — ABNORMAL HIGH (ref 0.0–0.2)

## 2019-07-03 LAB — SAMPLE TO BLOOD BANK

## 2019-07-03 MED ORDER — ACETAMINOPHEN 325 MG PO TABS
650.0000 mg | ORAL_TABLET | Freq: Once | ORAL | Status: AC
  Administered 2019-07-03: 650 mg via ORAL
  Filled 2019-07-03: qty 2

## 2019-07-03 MED ORDER — HEPARIN SOD (PORK) LOCK FLUSH 100 UNIT/ML IV SOLN
250.0000 [IU] | Freq: Once | INTRAVENOUS | Status: AC | PRN
  Administered 2019-07-03: 250 [IU]
  Filled 2019-07-03: qty 5

## 2019-07-03 MED ORDER — DIPHENHYDRAMINE HCL 25 MG PO CAPS: 25.0000 mg | ORAL_CAPSULE | Freq: Once | ORAL | Status: DC

## 2019-07-03 MED ORDER — HEPARIN SOD (PORK) LOCK FLUSH 100 UNIT/ML IV SOLN
INTRAVENOUS | Status: AC
  Filled 2019-07-03: qty 5

## 2019-07-03 MED ORDER — SODIUM CHLORIDE 0.9% FLUSH
10.0000 mL | Freq: Once | INTRAVENOUS | Status: AC
  Administered 2019-07-03: 10 mL via INTRAVENOUS
  Filled 2019-07-03: qty 10

## 2019-07-03 MED ORDER — SODIUM CHLORIDE 0.9% IV SOLUTION
250.0000 mL | Freq: Once | INTRAVENOUS | Status: AC
  Administered 2019-07-03: 250 mL via INTRAVENOUS
  Filled 2019-07-03: qty 250

## 2019-07-03 NOTE — Telephone Encounter (Signed)
Oral Oncology Patient Advocate Encounter  Received notification from [facility] that patient has been successfully enrolled into their program to receive Promacta from the manufacturer at $0 out of pocket until 06/11/2020.    I called and spoke with patient.  She knows we will have to re-apply.   Patient knows to call the office with questions or concerns.   Oral Oncology Clinic will continue to follow.  [facility] Phone [phone] Fax [phone] 07/03/2019 8:47 AM

## 2019-07-04 ENCOUNTER — Other Ambulatory Visit: Payer: Self-pay

## 2019-07-04 LAB — BPAM PLATELET PHERESIS
Blood Product Expiration Date: 202101222359
ISSUE DATE / TIME: 202101211029
Unit Type and Rh: 5100

## 2019-07-04 LAB — PREPARE PLATELET PHERESIS: Unit division: 0

## 2019-07-07 ENCOUNTER — Other Ambulatory Visit: Payer: Self-pay | Admitting: *Deleted

## 2019-07-07 ENCOUNTER — Inpatient Hospital Stay: Payer: Medicare Other | Admitting: *Deleted

## 2019-07-07 ENCOUNTER — Other Ambulatory Visit: Payer: Self-pay

## 2019-07-07 ENCOUNTER — Inpatient Hospital Stay (HOSPITAL_BASED_OUTPATIENT_CLINIC_OR_DEPARTMENT_OTHER): Payer: Medicare Other | Admitting: Internal Medicine

## 2019-07-07 ENCOUNTER — Inpatient Hospital Stay: Payer: Medicare Other

## 2019-07-07 VITALS — BP 137/71 | HR 64 | Temp 97.9°F | Resp 20

## 2019-07-07 DIAGNOSIS — Z95828 Presence of other vascular implants and grafts: Secondary | ICD-10-CM

## 2019-07-07 DIAGNOSIS — Z452 Encounter for adjustment and management of vascular access device: Secondary | ICD-10-CM

## 2019-07-07 DIAGNOSIS — C9200 Acute myeloblastic leukemia, not having achieved remission: Secondary | ICD-10-CM

## 2019-07-07 LAB — CBC WITH DIFFERENTIAL/PLATELET
Abs Immature Granulocytes: 0 10*3/uL (ref 0.00–0.07)
Basophils Absolute: 0 10*3/uL (ref 0.0–0.1)
Basophils Relative: 0 %
Blasts: 23 %
Eosinophils Absolute: 0 10*3/uL (ref 0.0–0.5)
Eosinophils Relative: 0 %
HCT: 25.6 % — ABNORMAL LOW (ref 36.0–46.0)
Hemoglobin: 7.9 g/dL — ABNORMAL LOW (ref 12.0–15.0)
Lymphocytes Relative: 25 %
Lymphs Abs: 1 10*3/uL (ref 0.7–4.0)
MCH: 27.2 pg (ref 26.0–34.0)
MCHC: 30.9 g/dL (ref 30.0–36.0)
MCV: 88.3 fL (ref 80.0–100.0)
Metamyelocytes Relative: 1 %
Monocytes Absolute: 1.1 10*3/uL — ABNORMAL HIGH (ref 0.1–1.0)
Monocytes Relative: 30 %
Neutro Abs: 0.8 10*3/uL — ABNORMAL LOW (ref 1.7–7.7)
Neutrophils Relative %: 21 %
Platelets: 6 10*3/uL — CL (ref 150–400)
RBC: 2.9 MIL/uL — ABNORMAL LOW (ref 3.87–5.11)
RDW: 18 % — ABNORMAL HIGH (ref 11.5–15.5)
Smear Review: DECREASED
WBC: 3.8 10*3/uL — ABNORMAL LOW (ref 4.0–10.5)
nRBC: 3.7 % — ABNORMAL HIGH (ref 0.0–0.2)

## 2019-07-07 LAB — SAMPLE TO BLOOD BANK

## 2019-07-07 LAB — COMPREHENSIVE METABOLIC PANEL
ALT: 34 U/L (ref 0–44)
AST: 80 U/L — ABNORMAL HIGH (ref 15–41)
Albumin: 3.3 g/dL — ABNORMAL LOW (ref 3.5–5.0)
Alkaline Phosphatase: 81 U/L (ref 38–126)
Anion gap: 11 (ref 5–15)
BUN: 33 mg/dL — ABNORMAL HIGH (ref 8–23)
CO2: 25 mmol/L (ref 22–32)
Calcium: 8.9 mg/dL (ref 8.9–10.3)
Chloride: 100 mmol/L (ref 98–111)
Creatinine, Ser: 0.75 mg/dL (ref 0.44–1.00)
GFR calc Af Amer: >60 mL/min (ref >60)
GFR calc non Af Amer: >60 mL/min (ref >60)
Glucose, Bld: 168 mg/dL — ABNORMAL HIGH (ref 70–99)
Potassium: 3.9 mmol/L (ref 3.5–5.1)
Sodium: 136 mmol/L (ref 135–145)
Total Bilirubin: 0.5 mg/dL (ref 0.3–1.2)
Total Protein: 5.9 g/dL — ABNORMAL LOW (ref 6.5–8.1)

## 2019-07-07 LAB — LACTATE DEHYDROGENASE: LDH: 2019 U/L — ABNORMAL HIGH (ref 98–192)

## 2019-07-07 MED ORDER — DIPHENHYDRAMINE HCL 25 MG PO CAPS
25.0000 mg | ORAL_CAPSULE | Freq: Once | ORAL | Status: DC
  Filled 2019-07-07: qty 1

## 2019-07-07 MED ORDER — ACETAMINOPHEN 325 MG PO TABS
650.0000 mg | ORAL_TABLET | Freq: Once | ORAL | Status: AC
  Administered 2019-07-07: 650 mg via ORAL
  Filled 2019-07-07: qty 2

## 2019-07-07 MED ORDER — HEPARIN SOD (PORK) LOCK FLUSH 100 UNIT/ML IV SOLN
250.0000 [IU] | INTRAVENOUS | Status: AC | PRN
  Administered 2019-07-07: 250 [IU]
  Filled 2019-07-07: qty 5

## 2019-07-07 MED ORDER — SODIUM CHLORIDE 0.9% FLUSH
10.0000 mL | INTRAVENOUS | Status: AC | PRN
  Administered 2019-07-07: 10 mL
  Filled 2019-07-07: qty 10

## 2019-07-07 MED ORDER — SODIUM CHLORIDE 0.9% IV SOLUTION
250.0000 mL | Freq: Once | INTRAVENOUS | Status: AC
  Administered 2019-07-07: 11:00:00 250 mL via INTRAVENOUS
  Filled 2019-07-07: qty 250

## 2019-07-07 MED ORDER — HEPARIN SOD (PORK) LOCK FLUSH 100 UNIT/ML IV SOLN
INTRAVENOUS | Status: AC
  Filled 2019-07-07: qty 5

## 2019-07-07 MED ORDER — SODIUM CHLORIDE 0.9% FLUSH
10.0000 mL | Freq: Once | INTRAVENOUS | Status: AC
  Administered 2019-07-07: 10 mL via INTRAVENOUS
  Filled 2019-07-07: qty 10

## 2019-07-07 MED ORDER — SODIUM CHLORIDE 0.9% FLUSH
10.0000 mL | Freq: Once | INTRAVENOUS | Status: AC
  Filled 2019-07-07: qty 10

## 2019-07-07 NOTE — Progress Notes (Signed)
[facility] OFFICE PROGRESS NOTE  Patient Care Team: Rusty Aus, MD as PCP - General (Internal Medicine)  CHIEF COMPLAINTS/PURPOSE OF CONSULTATION: Acute myeloid leukemia   Oncology History Overview Note  # SEP 2017- MYELOPROLIFERATIVE NEOPLASM- [WBC- 24; normal Hb/platelets] hypercellular bone marrow 90-95% proliferation of myeloid cells in various stages of maturation; relative erythroid hypoplasia and proliferation of atypical megakaryocytes; no increase in blasts; peripheral blood Bcr-Abl-NEG; Cytogenetics- WNL. NEG- Jak-2/MPL/CALR [place] limited- mild splenomegaly [~10cm; 463cm3]; OCT 2017- second opinion at [facility]. Surveillance.   # With progressive leukocytosis we evaluated her a month ago and sent BM for exam. She has a progressive leukocytosis so hydrea 576m daily was started on 10/20/2016 and increased to 2gm daily then back down to one daily, then 5 days per week over time due to counts drop. Then we held her hydrea since 04/09/2018 due to continued declining Hb and Plt.s  BMB 06/24/18 showed markedly hypercellular BM 95% with myeloid hyperplasia and atypical megakaryocytic hyperplasia. No significant increase in blasts. 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 BCR / ABL negative, FISH normal. Flow Showed 2%CD34-positive myeloid blasts. Myeloid precursors with low side scatter. Pathogenic variants were detected in the ASXL1, CCND2, CUX1, and U2AF1 genes.  # March 2020- wbc- 14/ Hb 9.5/ platelets- 54.   ---------------------------------------------------   # June 8th 2020- Acute myeloid leukemia [peripheral blood flow cytometry;NGS- pending]- June 15th Vidaza [SQ 1-5 + Venatoclax- #1in pt]; tumor lysis prophylaxis  # Day #28 bone marrow biopsy-  7/14- BMx- NO BLASTS; positive for dysplastic changes. OFF Venatoclax [since 7/17] sec to cytopenias.  #817 cycle #2 Vidaza+ venetoclax 100 mg; stop venetoclax on  8/24 [severe pancytopenia]   # 02/18/2019-start gilteritinib 80 mg a day; HELD on 9/14 [sec to severe pan]  # NOV 9th/2020-BMBx- RELAPSED AML; NOV 11th- STARTED GILTERITINIB 424mday; NOV17th 2020- PROMACTA 5067may; NOV 30th- inrease Gilt to 80 mg/day; DEC 31st-STOP Gilt/ Promatca [no reponse; JAN 5th BMBx- partial response; JAN 8th 2021-PROCEED with GILTERTINIB 120m6my.    # AUG 2020- AST elevation- 248-?sec to Posonoazole; RESOLVED; continue at lower dose 100 mg/day.   # 2002 [[place]] BREAST CA s/p Lumpect RT; [? Stage I] no chemo s/p AI  # F-ONE HEM: FLT3 ALTERATION NOTED  # JAN 2021- 1/18-Palliative care evaluation.   DIAGNOSIS: ACUTE MYELOID LEUKEMIA GOALS: pallaitive  CURRENT/MOST RECENT THERAPY : Gilteritinib     MDS (myelodysplastic syndrome) (HCC) (Resolved)  08/28/2018 Initial Diagnosis   MDS (myelodysplastic syndrome) (HCC)   Acute myeloid leukemia not having achieved remission (HCC)[place]/15/2020 Initial Diagnosis   AML (acute myeloid leukemia) with failed remission (HCC)[place]6/15/2020 - 12/22/2018 Chemotherapy   The patient had azaCITIDine (VIDAZA) chemo injection 105 mg, 75 mg/m2 = 105 mg, Subcutaneous,  Once, 1 of 4 cycles Administration: 105 mg (11/25/2018), 105 mg (11/26/2018), 105 mg (11/27/2018), 105 mg (11/28/2018), 105 mg (11/29/2018)  for chemotherapy treatment.    01/27/2019 -  Chemotherapy   The patient had palonosetron (ALOXI) injection 0.25 mg, 0.25 mg, Intravenous,  Once, 1 of 4 cycles Administration: 0.25 mg (01/27/2019), 0.25 mg (01/29/2019), 0.25 mg (01/31/2019) azaCITIDine (VIDAZA) 100 mg in sodium chloride 0.9 % 50 mL chemo infusion, 75 mg/m2 = 100 mg, Intravenous, Once, 1 of 4 cycles Administration: 100 mg (01/27/2019), 100 mg (01/28/2019), 100 mg (01/29/2019), 100 mg (01/30/2019), 100 mg (01/31/2019)  for chemotherapy treatment.      HISTORY OF PRESENTING ILLNESS:  Kathryn Hahn 85 y83.  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  Patient states her jaw swelling/neck swelling improved on Augmentin.  Today the last day of Augmentin.  No fever no chills.  Continues to have increasing bruising blood blisters in the mouth.  No obvious nosebleeds.    Review of Systems  Constitutional: Positive for malaise/fatigue. Negative for chills, diaphoresis, fever and weight loss.  HENT: Negative for nosebleeds and sore throat.   Eyes: Negative for double vision.  Respiratory: Negative for cough, hemoptysis, sputum production and wheezing.   Cardiovascular: Negative for chest pain, palpitations and orthopnea.  Gastrointestinal: Negative for abdominal pain, blood in stool, constipation, diarrhea, heartburn, melena, nausea and vomiting.  Genitourinary: Negative for dysuria, frequency and urgency.  Musculoskeletal: Positive for back pain. Negative for joint pain.  Skin: Negative.  Negative for itching and rash.  Neurological: Negative for dizziness, tingling, focal weakness, weakness and headaches.  Endo/Heme/Allergies: Bruises/bleeds easily.  Psychiatric/Behavioral: Negative for depression. The patient is not nervous/anxious and does not have insomnia.     MEDICAL HISTORY:  Past Medical History:  Diagnosis Date  . Anemia   . Arthritis   . Breast cancer ([place]) 2003   RT LUMPECTOMY  . Edema leg   . History of breast cancer 2003   post lumpectomy  . History of kidney stones   . Hyperlipemia, mixed   . Hypertension, essential   . Leukemia ([place])   . Leukocytosis   . MDS (myelodysplastic syndrome) ([place])   . Personal history of radiation therapy 2003   BREAST CA  . Renal stones   . Vitamin D deficiency     SURGICAL HISTORY: Past Surgical History:  Procedure Laterality Date  . BREAST BIOPSY Right 2003   Postive for cancer  . BREAST LUMPECTOMY Right 2003   BREAST CA  . KIDNEY STONE SURGERY Right     SOCIAL HISTORY: Social  History   Socioeconomic History  . Marital status: Married    Spouse name: Not on file  . Number of children: Not on file  . Years of education: Not on file  . Highest education level: Not on file  Occupational History  . Not on file  Tobacco Use  . Smoking status: Never Smoker  . Smokeless tobacco: Never Used  Substance and Sexual Activity  . Alcohol use: Yes    Alcohol/week: 3.0 standard drinks    Types: 3 Glasses of wine per week  . Drug use: No  . Sexual activity: Not on file  Other Topics Concern  . Not on file  Social History Narrative    No smoking/alcohol; lives in [place] with family.  She lives in assisted living with her husband.   Social Determinants of Health   Financial Resource Strain:   . Difficulty of Paying Living Expenses: Not on file  Food Insecurity:   . Worried About Charity fundraiser in the Last Year: Not on file  . Ran Out of Food in the Last Year: Not on file  Transportation Needs:   . Lack of Transportation (Medical): Not on file  . Lack of Transportation (Non-Medical): Not on file  Physical Activity:   . Days of Exercise per Week: Not on file  . Minutes of Exercise per Session: Not on file  Stress:   . Feeling of Stress : Not on file  Social Connections:   . Frequency of Communication with Friends and Family: Not on file  . Frequency of Social  Gatherings with Friends and Family: Not on file  . Attends Religious Services: Not on file  . Active Member of Clubs or Organizations: Not on file  . Attends Archivist Meetings: Not on file  . Marital Status: Not on file  Intimate Partner Violence:   . Fear of Current or Ex-Partner: Not on file  . Emotionally Abused: Not on file  . Physically Abused: Not on file  . Sexually Abused: Not on file    FAMILY HISTORY: Family History  Problem Relation Age of Onset  . Stroke Mother   . Hypertension Father   . Stroke Father   . Breast cancer Neg Hx     ALLERGIES:  is allergic to  terbinafine.  MEDICATIONS:  Current Outpatient Medications  Medication Sig Dispense Refill  . acetaminophen (TYLENOL) 325 MG tablet Take 325 mg by mouth every 6 (six) hours as needed for mild pain or moderate pain.     Marland Kitchen acyclovir (ZOVIRAX) 400 MG tablet ONE PILL A DAY [TO PREVENT SHINGLES] 90 tablet 1  . amoxicillin-clavulanate (AUGMENTIN) 875-125 MG tablet Take 1 tablet by mouth 2 (two) times daily for 10 days. 20 tablet 0  . atenolol (TENORMIN) 50 MG tablet Take 1 tablet by mouth 2 (two) times daily.    . calcium carbonate (OSCAL) 1500 (600 Ca) MG TABS tablet Take 600 mg of elemental calcium by mouth daily with breakfast.    . Cholecalciferol (VITAMIN D) 2000 units tablet Take 1 tablet by mouth daily.    . Gilteritinib Fumarate (XOSPATA) 40 MG TABS Take 120 mg by mouth daily. 90 tablet 2  . heparin lock flush 100 UNIT/ML SOLN injection Inject into the vein.    . Infant Care Products (DERMACLOUD) CREA Apply topically.    Marland Kitchen KLOR-CON M20 20 MEQ tablet TAKE 1 TABLET BY MOUTH TWICE A DAY 180 tablet 0  . levofloxacin (LEVAQUIN) 250 MG tablet Take 1 tablet (250 mg total) by mouth daily. 30 tablet 3  . losartan-hydrochlorothiazide (HYZAAR) 50-12.5 MG tablet Take 1 tablet by mouth daily.    . magnesium hydroxide (MILK OF MAGNESIA) 400 MG/5ML suspension Take 5 mLs by mouth every 4 (four) hours as needed.    . ondansetron (ZOFRAN) 8 MG tablet Take by mouth every 8 (eight) hours as needed for nausea or vomiting.    . posaconazole (NOXAFIL) 100 MG TBEC delayed-release tablet Take 1 tablet (100 mg total) by mouth daily. 30 tablet 3  . Potassium Chloride ER 20 MEQ TBCR Take 20 mEq by mouth daily. 60 tablet 1  . senna (SENOKOT) 8.6 MG tablet Take 2 tablets by mouth 2 (two) times daily.    Marland Kitchen eltrombopag (PROMACTA) 50 MG tablet Take 1 tablet (50 mg total) by mouth daily. Take on an empty stomach 1 hour before a meal or 2 hours after (Patient not taking: Reported on 06/20/2019) 30 tablet 3  . magic mouthwash  w/lidocaine SOLN Take 5 mLs by mouth 4 (four) times daily as needed for mouth pain. (Patient not taking: Reported on 06/20/2019) 480 mL 3  . polyethylene glycol (MIRALAX / GLYCOLAX) 17 g packet Take 17 g by mouth daily.     No current facility-administered medications for this visit.   Facility-Administered Medications Ordered in Other Visits  Medication Dose Route Frequency Provider Last Rate Last Admin  . 0.9 %  sodium chloride infusion   Intravenous Continuous Jacquelin Hawking, NP   Stopped at 02/05/19 1630  . heparin lock flush 100 unit/mL  250 Units Intravenous Once Charlaine Dalton R, MD      . heparin lock flush 100 unit/mL  250 Units Intracatheter PRN Charlaine Dalton R, MD      . sodium chloride flush (NS) 0.9 % injection 10 mL  10 mL Intravenous Once Charlaine Dalton R, MD      . sodium chloride flush (NS) 0.9 % injection 10 mL  10 mL Intracatheter PRN Cammie Sickle, MD        PHYSICAL EXAMINATION: ECOG PERFORMANCE STATUS: 1 - Symptomatic but completely ambulatory  Vitals:   07/07/19 0911  BP: (!) 172/92  Pulse: 70  Resp: 18  Temp: 97.7 F (36.5 C)  SpO2: 99%   Filed Weights   07/07/19 0911  Weight: 98 lb 12.8 oz (44.8 kg)    Physical Exam  Constitutional: She is oriented to person, place, and time and well-developed, well-nourished, and in no distress.  PICC line in place.  No active bleeding.  Masked.  Walking herself.  Accompanied by husband.  HENT:  Head: Normocephalic and atraumatic.  Mouth/Throat: Oropharynx is clear and moist. No oropharyngeal exudate.  Ecchymosis noted in the buccal mucosa.  Eyes: Conjunctivae are normal. No scleral icterus.  Cardiovascular: Normal rate and regular rhythm.  Pulmonary/Chest: Effort normal. No respiratory distress.  Abdominal: Soft. Bowel sounds are normal. She exhibits no distension and no mass. There is no abdominal tenderness. There is no rebound and no guarding.  Musculoskeletal:        General: No  tenderness or edema. Normal range of motion.     Cervical back: Normal range of motion and neck supple.  Neurological: She is alert and oriented to person, place, and time.  Skin: Skin is warm.  Multiple bruises on lower legs, few on hands and arms. Torso bruising improved/mostly resolved  Psychiatric: Affect normal.    LABORATORY DATA:  I have reviewed the data as listed Lab Results  Component Value Date   WBC 3.8 (L) 07/07/2019   HGB 7.9 (L) 07/07/2019   HCT 25.6 (L) 07/07/2019   MCV 88.3 07/07/2019   PLT 6 (LL) 07/07/2019   Recent Labs    06/23/19 0820 06/30/19 0853 07/07/19 0854  NA 137 138 136  K 3.6 3.8 3.9  CL 100 100 100  CO2 _0 GLUCOSE 200* 150* 168*  BUN 28* 34* 33*  CREATININE 0.85 1.08* 0.75  CALCIUM 9.1 9.4 8.9  GFRNONAA >60 47* >60  GFRAA >60 55* >60  PROT 6.4* 6.3* 5.9*  ALBUMIN 3.5 3.6 3.3*  AST 41 55* 80*  ALT 28 31 34  ALKPHOS 74 75 81  BILITOT 0.6 0.6 0.5    RADIOGRAPHIC STUDIES: I have personally reviewed the radiological images as listed and agreed with the findings in the report. CT MAXILLOFACIAL W CONTRAST  Result Date: 06/27/2019 CLINICAL DATA:  Left-sided facial swelling. AML. Symptoms began today. EXAM: CT MAXILLOFACIAL WITH CONTRAST TECHNIQUE: Multidetector CT imaging of the maxillofacial structures was performed with intravenous contrast. Multiplanar CT image reconstructions were also generated. CONTRAST:  78m OMNIPAQUE IOHEXOL 300 MG/ML  SOLN COMPARISON:  01/09/2017 FINDINGS: Osseous: Normal. No evidence of active dental or periodontal disease. Orbits: Normal Sinuses: Clear and normal. Soft tissues: Inflammatory swelling of the left parotid gland diffusely. No evidence of mass lesion or stone. Nonspecific acute parotitis is usually viral. Right parotid gland is normal. Both submandibular glands are normal. Limited intracranial: Normal Left jugular vein is not yet opacified. This is presumed represent an artifact of  delayed filling  rather than thrombosis. IMPRESSION: Enlarged and edematous left parotid gland consistent with acute left parotitis. No evidence of drainable abscess, mass or stone. Most common etiology is viral inflammation. Electronically Signed   By: Nelson Chimes M.D.   On: 06/27/2019 14:30   CT BONE MARROW BIOPSY & ASPIRATION  Result Date: 06/17/2019 INDICATION: AML EXAM: CT GUIDED RIGHT ILIAC BONE MARROW ASPIRATION AND CORE BIOPSY Date:  06/17/2019 06/17/2019 10:21 am Radiologist:  M. Daryll Brod, MD Guidance:  CT FLUOROSCOPY TIME:  Fluoroscopy Time: None. MEDICATIONS: 1% lidocaine local ANESTHESIA/SEDATION: 1.0 mg IV Versed; 50 mcg IV Fentanyl Moderate Sedation Time:  12 minutes The patient was continuously monitored during the procedure by the interventional radiology nurse under my direct supervision. CONTRAST:  None. COMPLICATIONS: None PROCEDURE: Informed consent was obtained from the patient following explanation of the procedure, risks, benefits and alternatives. The patient understands, agrees and consents for the procedure. All questions were addressed. A time out was performed. The patient was positioned prone and non-contrast localization CT was performed of the pelvis to demonstrate the iliac marrow spaces. Maximal barrier sterile technique utilized including caps, mask, sterile gowns, sterile gloves, large sterile drape, hand hygiene, and Betadine prep. Under sterile conditions and local anesthesia, an 11 gauge coaxial bone biopsy needle was advanced into the right iliac marrow space. Needle position was confirmed with CT imaging. Initially, bone marrow aspiration was performed. Next, the 11 gauge outer cannula was utilized to obtain a right iliac bone marrow core biopsy. Needle was removed. Hemostasis was obtained with compression. The patient tolerated the procedure well. Samples were prepared with the cytotechnologist. No immediate complications. IMPRESSION: CT guided right iliac bone marrow aspiration and core  biopsy. Electronically Signed   By: Jerilynn Mages.  Shick M.D.   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 on gilteritinib 80 mg a day; January 5th 2021 bone marrow biopsy-approximately 27% blasts present-partial response.    # currently on Gilterinib 120 mg/day-tolerating well. Continue current dose at this time.  #Left parotitis-currently on Augmentin twice a day for the last 10 days- improved.  Proceed with Levaquin after finishing Augmentin today.  # Leucopenia/ anemia/thrombocytopenia-White count-3.8 ANC pending; /hemoglobin 7.9 platelets 6. :  secondary to underlying acute myeloid leukemia/Gilteritinib.  Proceed with platelet transfusion today/PRBC as needed.  #Patient s/p Covid vaccination-#1-1/05 [#2 on 2/03]; s/p pack; also units of platelets.   #Supportive care for AML-  -Continue Noxafil/posaconazole 153m and acyclovir 400 mg; restart levaquin 250 mg after finishing Augmentin today. Continue PICC line dressing changes weekly  Disposition: #  Today-2 unit platelets today; NO PRBC.   # 1/28th labs- hold tube'cbc; possible 1 unit platelets/1 unit PRBC transfusion-  # 02/01-MD; cbc/cmp/ldh hold tube'cbc; possible 2 unit platelets/1 unit PRBC transfusion-  # 02/04-labs-hold tube/'cbc; possible 1 unit platelets/1 unit PRBC transfusion-Dr.B Dr.B

## 2019-07-07 NOTE — Assessment & Plan Note (Addendum)
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 on gilteritinib 80 mg a day; January 5th 2021 bone marrow biopsy-approximately 27% blasts present-partial response.    # currently on Gilterinib 120 mg/day-tolerating well. Continue current dose at this time.  #Left parotitis-currently on Augmentin twice a day for the last 10 days- improved.  Proceed with Levaquin after finishing Augmentin today.  # Leucopenia/ anemia/thrombocytopenia-White count-3.8 ANC pending; /hemoglobin 7.9 platelets 6. :  secondary to underlying acute myeloid leukemia/Gilteritinib.  Proceed with platelet transfusion today/PRBC as needed.  #Patient s/p Covid vaccination-#1-1/05 [#2 on 2/03]; s/p pack; also units of platelets.   #Supportive care for AML-  -Continue Noxafil/posaconazole '100mg'$  and acyclovir 400 mg; restart levaquin 250 mg after finishing Augmentin today. Continue PICC line dressing changes weekly  Disposition: #  Today-2 unit platelets today; NO PRBC.   # 1/28th labs- hold tube'cbc; possible 1 unit platelets/1 unit PRBC transfusion-  # 02/01-MD; cbc/cmp/ldh hold tube'cbc; possible 2 unit platelets/1 unit PRBC transfusion-  # 02/04-labs-hold tube/'cbc; possible 1 unit platelets/1 unit PRBC transfusion-Dr.B Dr.B

## 2019-07-08 LAB — PREPARE PLATELET PHERESIS
Unit division: 0
Unit division: 0

## 2019-07-08 LAB — BPAM PLATELET PHERESIS
Blood Product Expiration Date: 202101282359
Blood Product Expiration Date: 202101282359
ISSUE DATE / TIME: 202101251128
ISSUE DATE / TIME: 202101251238
Unit Type and Rh: 5100
Unit Type and Rh: 6200

## 2019-07-09 ENCOUNTER — Other Ambulatory Visit: Payer: Self-pay

## 2019-07-10 ENCOUNTER — Other Ambulatory Visit: Payer: Self-pay

## 2019-07-10 ENCOUNTER — Inpatient Hospital Stay: Payer: Medicare Other | Admitting: *Deleted

## 2019-07-10 ENCOUNTER — Other Ambulatory Visit: Payer: Self-pay | Admitting: *Deleted

## 2019-07-10 ENCOUNTER — Inpatient Hospital Stay: Payer: Medicare Other

## 2019-07-10 DIAGNOSIS — C9200 Acute myeloblastic leukemia, not having achieved remission: Secondary | ICD-10-CM

## 2019-07-10 DIAGNOSIS — Z452 Encounter for adjustment and management of vascular access device: Secondary | ICD-10-CM

## 2019-07-10 LAB — CBC WITH DIFFERENTIAL/PLATELET
Abs Immature Granulocytes: 0.28 10*3/uL — ABNORMAL HIGH (ref 0.00–0.07)
Basophils Absolute: 0 10*3/uL (ref 0.0–0.1)
Basophils Relative: 0 %
Eosinophils Absolute: 0 10*3/uL (ref 0.0–0.5)
Eosinophils Relative: 0 %
HCT: 26.3 % — ABNORMAL LOW (ref 36.0–46.0)
Hemoglobin: 8 g/dL — ABNORMAL LOW (ref 12.0–15.0)
Immature Granulocytes: 6 %
Lymphocytes Relative: 18 %
Lymphs Abs: 0.8 10*3/uL (ref 0.7–4.0)
MCH: 26.8 pg (ref 26.0–34.0)
MCHC: 30.4 g/dL (ref 30.0–36.0)
MCV: 88 fL (ref 80.0–100.0)
Monocytes Absolute: 2.8 10*3/uL — ABNORMAL HIGH (ref 0.1–1.0)
Monocytes Relative: 59 %
Neutro Abs: 0.8 10*3/uL — ABNORMAL LOW (ref 1.7–7.7)
Neutrophils Relative %: 17 %
Platelets: 15 10*3/uL — CL (ref 150–400)
RBC: 2.99 MIL/uL — ABNORMAL LOW (ref 3.87–5.11)
RDW: 17.8 % — ABNORMAL HIGH (ref 11.5–15.5)
Smear Review: DECREASED
WBC: 4.7 10*3/uL (ref 4.0–10.5)
nRBC: 4.5 % — ABNORMAL HIGH (ref 0.0–0.2)

## 2019-07-10 LAB — SAMPLE TO BLOOD BANK

## 2019-07-10 MED ORDER — SODIUM CHLORIDE 0.9% FLUSH
10.0000 mL | Freq: Once | INTRAVENOUS | Status: AC
  Administered 2019-07-10: 10 mL via INTRAVENOUS
  Filled 2019-07-10: qty 10

## 2019-07-10 MED ORDER — ACETAMINOPHEN 325 MG PO TABS
650.0000 mg | ORAL_TABLET | Freq: Once | ORAL | Status: AC
  Administered 2019-07-10: 10:00:00 650 mg via ORAL
  Filled 2019-07-10: qty 2

## 2019-07-10 MED ORDER — HEPARIN SOD (PORK) LOCK FLUSH 100 UNIT/ML IV SOLN
500.0000 [IU] | Freq: Once | INTRAVENOUS | Status: AC
  Administered 2019-07-10: 12:00:00 500 [IU] via INTRAVENOUS
  Filled 2019-07-10: qty 5

## 2019-07-10 MED ORDER — HEPARIN SOD (PORK) LOCK FLUSH 100 UNIT/ML IV SOLN
INTRAVENOUS | Status: AC
  Filled 2019-07-10: qty 5

## 2019-07-10 MED ORDER — DIPHENHYDRAMINE HCL 25 MG PO CAPS
25.0000 mg | ORAL_CAPSULE | Freq: Once | ORAL | Status: DC
  Filled 2019-07-10: qty 1

## 2019-07-10 MED ORDER — SODIUM CHLORIDE 0.9% IV SOLUTION
250.0000 mL | Freq: Once | INTRAVENOUS | Status: AC
  Administered 2019-07-10: 10:00:00 250 mL via INTRAVENOUS
  Filled 2019-07-10: qty 250

## 2019-07-11 LAB — BPAM PLATELET PHERESIS
Blood Product Expiration Date: 202101302359
ISSUE DATE / TIME: 202101281055
Unit Type and Rh: 5100

## 2019-07-11 LAB — PREPARE PLATELET PHERESIS: Unit division: 0

## 2019-07-14 ENCOUNTER — Other Ambulatory Visit: Payer: Self-pay | Admitting: *Deleted

## 2019-07-14 ENCOUNTER — Other Ambulatory Visit: Payer: Self-pay

## 2019-07-14 ENCOUNTER — Telehealth: Payer: Self-pay | Admitting: Internal Medicine

## 2019-07-14 ENCOUNTER — Inpatient Hospital Stay (HOSPITAL_BASED_OUTPATIENT_CLINIC_OR_DEPARTMENT_OTHER): Payer: Medicare Other | Admitting: Internal Medicine

## 2019-07-14 ENCOUNTER — Inpatient Hospital Stay: Payer: Medicare Other | Attending: Internal Medicine | Admitting: *Deleted

## 2019-07-14 ENCOUNTER — Inpatient Hospital Stay: Payer: Medicare Other

## 2019-07-14 DIAGNOSIS — C9200 Acute myeloblastic leukemia, not having achieved remission: Secondary | ICD-10-CM

## 2019-07-14 DIAGNOSIS — Z79899 Other long term (current) drug therapy: Secondary | ICD-10-CM | POA: Diagnosis not present

## 2019-07-14 DIAGNOSIS — C9202 Acute myeloblastic leukemia, in relapse: Secondary | ICD-10-CM | POA: Insufficient documentation

## 2019-07-14 DIAGNOSIS — Z452 Encounter for adjustment and management of vascular access device: Secondary | ICD-10-CM

## 2019-07-14 LAB — COMPREHENSIVE METABOLIC PANEL
ALT: 38 U/L (ref 0–44)
AST: 102 U/L — ABNORMAL HIGH (ref 15–41)
Albumin: 3.3 g/dL — ABNORMAL LOW (ref 3.5–5.0)
Alkaline Phosphatase: 88 U/L (ref 38–126)
Anion gap: 11 (ref 5–15)
BUN: 39 mg/dL — ABNORMAL HIGH (ref 8–23)
CO2: 24 mmol/L (ref 22–32)
Calcium: 9 mg/dL (ref 8.9–10.3)
Chloride: 101 mmol/L (ref 98–111)
Creatinine, Ser: 0.93 mg/dL (ref 0.44–1.00)
GFR calc Af Amer: >60 mL/min (ref >60)
GFR calc non Af Amer: 57 mL/min — ABNORMAL LOW (ref >60)
Glucose, Bld: 177 mg/dL — ABNORMAL HIGH (ref 70–99)
Potassium: 3.9 mmol/L (ref 3.5–5.1)
Sodium: 136 mmol/L (ref 135–145)
Total Bilirubin: 0.5 mg/dL (ref 0.3–1.2)
Total Protein: 6.1 g/dL — ABNORMAL LOW (ref 6.5–8.1)

## 2019-07-14 LAB — CBC WITH DIFFERENTIAL/PLATELET
Abs Immature Granulocytes: 0.3 10*3/uL — ABNORMAL HIGH (ref 0.00–0.07)
Basophils Absolute: 0 10*3/uL (ref 0.0–0.1)
Basophils Relative: 0 %
Blasts: 17 %
Eosinophils Absolute: 0 10*3/uL (ref 0.0–0.5)
Eosinophils Relative: 0 %
HCT: 24.3 % — ABNORMAL LOW (ref 36.0–46.0)
Hemoglobin: 7.6 g/dL — ABNORMAL LOW (ref 12.0–15.0)
Lymphocytes Relative: 21 %
Lymphs Abs: 1.3 10*3/uL (ref 0.7–4.0)
MCH: 27.4 pg (ref 26.0–34.0)
MCHC: 31.3 g/dL (ref 30.0–36.0)
MCV: 87.7 fL (ref 80.0–100.0)
Metamyelocytes Relative: 4 %
Monocytes Absolute: 2.2 10*3/uL — ABNORMAL HIGH (ref 0.1–1.0)
Monocytes Relative: 34 %
Neutro Abs: 1.5 10*3/uL — ABNORMAL LOW (ref 1.7–7.7)
Neutrophils Relative %: 24 %
Platelets: 10 10*3/uL — CL (ref 150–400)
RBC: 2.77 MIL/uL — ABNORMAL LOW (ref 3.87–5.11)
RDW: 18.2 % — ABNORMAL HIGH (ref 11.5–15.5)
WBC: 6.4 10*3/uL (ref 4.0–10.5)
nRBC: 5.7 % — ABNORMAL HIGH (ref 0.0–0.2)

## 2019-07-14 LAB — PREPARE RBC (CROSSMATCH)

## 2019-07-14 LAB — LACTATE DEHYDROGENASE: LDH: 2510 U/L — ABNORMAL HIGH (ref 98–192)

## 2019-07-14 LAB — SAMPLE TO BLOOD BANK

## 2019-07-14 MED ORDER — DIPHENHYDRAMINE HCL 25 MG PO CAPS: 25.0000 mg | ORAL_CAPSULE | Freq: Once | ORAL | Status: DC

## 2019-07-14 MED ORDER — HEPARIN SOD (PORK) LOCK FLUSH 100 UNIT/ML IV SOLN
500.0000 [IU] | Freq: Once | INTRAVENOUS | Status: AC
  Administered 2019-07-14: 15:00:00 500 [IU] via INTRAVENOUS
  Filled 2019-07-14: qty 5

## 2019-07-14 MED ORDER — HEPARIN SOD (PORK) LOCK FLUSH 100 UNIT/ML IV SOLN
INTRAVENOUS | Status: AC
  Filled 2019-07-14: qty 5

## 2019-07-14 MED ORDER — SODIUM CHLORIDE 0.9% FLUSH
10.0000 mL | Freq: Once | INTRAVENOUS | Status: AC
  Administered 2019-07-14: 08:00:00 10 mL via INTRAVENOUS
  Filled 2019-07-14: qty 10

## 2019-07-14 MED ORDER — SODIUM CHLORIDE 0.9% IV SOLUTION
250.0000 mL | Freq: Once | INTRAVENOUS | Status: AC
  Administered 2019-07-14: 10:00:00 250 mL via INTRAVENOUS
  Filled 2019-07-14: qty 250

## 2019-07-14 MED ORDER — MAGIC MOUTHWASH W/LIDOCAINE: 5.0000 mL | Freq: Four times a day (QID) | ORAL | 3 refills | Status: AC | PRN

## 2019-07-14 MED ORDER — ACETAMINOPHEN 325 MG PO TABS
650.0000 mg | ORAL_TABLET | Freq: Once | ORAL | Status: AC
  Administered 2019-07-14: 10:00:00 650 mg via ORAL
  Filled 2019-07-14: qty 2

## 2019-07-14 NOTE — Assessment & Plan Note (Addendum)
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 currently on Gilterinib 120 mg/day-tolerating well.  Stable.  # Continue current dose at this time.  No evidence of significant hematologic response.  Discussed that we will likely repeat bone marrow biopsy sometime in early March to assess response.  #Left parotitis-s/p Augmentin resolved.  Currently on prophylactic levofloxacin.  # Leucopenia/ anemia/thrombocytopenia-White count-6.4 ANC pending; /hemoglobin 7.6 platelets-10  secondary to underlying acute myeloid leukemia/Gilteritinib.  Proceed with 2 platelet transfusion today/1 PRBC today.   #Patient s/p Covid vaccination-#1-1/05 [#2 on 2/03]; 2 units units of platelets.  # AST slightly enzymes-101 elevated- ? monitor for now.    #Supportive care for AML-  -Continue Noxafil/posaconazole 158m and acyclovir 400 mg; restart levaquin 250 mg after finishing Augmentin today. Continue PICC line dressing changes weekly  Disposition: #  Today-2 unit platelets today; NO PRBC.   # 02/04-labs-hold tube/'cbc-CMP; possible 1 unit platelets/1 unit PRBC transfusion-  # 02/08-MD; cbc/cmp/ldh hold tube'cbc; possible 2 unit platelets/1 unit PRBC transfusion-  # 02/11-labs-hold tube/'cbc; possible 1 unit platelets/1 unit PRBC transfusion-Dr.B

## 2019-07-14 NOTE — Telephone Encounter (Signed)
Spoke to patient husband regarding the clinical status-no significant hematologic recovery noted.  And possible bone marrow biopsy in early March.  Discussed regarding elevated liver numbers monitor for now.

## 2019-07-14 NOTE — Progress Notes (Signed)
Kathryn Hahn OFFICE PROGRESS NOTE  Patient Care Team: Kathryn Aus, MD as PCP - General (Internal Medicine)  CHIEF COMPLAINTS/PURPOSE OF CONSULTATION: Acute myeloid leukemia   Oncology History Overview Note  # SEP 2017- MYELOPROLIFERATIVE NEOPLASM- [WBC- 24; normal Hb/platelets] hypercellular bone marrow 90-95% proliferation of myeloid cells in various stages of maturation; relative erythroid hypoplasia and proliferation of atypical megakaryocytes; no increase in blasts; peripheral blood Bcr-Abl-NEG; Cytogenetics- WNL. NEG- Jak-2/MPL/CALR [place] limited- mild splenomegaly [~10cm; 463cm3]; OCT 2017- second opinion at [facility]. Surveillance.   # With progressive leukocytosis we evaluated her a month ago and sent BM for exam. She has a progressive leukocytosis so hydrea 576m daily was started on 10/20/2016 and increased to 2gm daily then back down to one daily, then 5 days per week over time due to counts drop. Then we held her hydrea since 04/09/2018 due to continued declining Hb and Plt.s  BMB 06/24/18 showed markedly hypercellular BM 95% with myeloid hyperplasia and atypical megakaryocytic hyperplasia. No significant increase in blasts. 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 BCR / ABL negative, FISH normal. Flow Showed 2%CD34-positive myeloid blasts. Myeloid precursors with low side scatter. Pathogenic variants were detected in the ASXL1, CCND2, CUX1, and U2AF1 genes.  # March 2020- wbc- 14/ Hb 9.5/ platelets- 54.   ---------------------------------------------------   # June 8th 2020- Acute myeloid leukemia [peripheral blood flow cytometry;NGS- pending]- June 15th Vidaza [SQ 1-5 + Venatoclax- #1in pt]; tumor lysis prophylaxis  # Day #28 bone marrow biopsy-  7/14- BMx- NO BLASTS; positive for dysplastic changes. OFF Venatoclax [since 7/17] sec to cytopenias.  #817 cycle #2 Vidaza+ venetoclax 100 mg; stop venetoclax on  8/24 [severe pancytopenia]   # 02/18/2019-start gilteritinib 80 mg a day; HELD on 9/14 [sec to severe pan]  # NOV 9th/2020-BMBx- RELAPSED AML; NOV 11th- STARTED GILTERITINIB 424mday; NOV17th 2020- PROMACTA 5067may; NOV 30th- inrease Gilt to 80 mg/day; DEC 31st-STOP Gilt/ Promatca [no reponse; JAN 5th BMBx- partial response; JAN 8th 2021-PROCEED with GILTERTINIB 120m6my.    # AUG 2020- AST elevation- 248-?sec to Posonoazole; RESOLVED; continue at lower dose 100 mg/day.   # 2002 [[place]] BREAST CA s/p Lumpect RT; [? Stage I] no chemo s/p AI  # F-ONE HEM: FLT3 ALTERATION NOTED  # JAN 2021- 1/18-Palliative care evaluation.   DIAGNOSIS: ACUTE MYELOID LEUKEMIA GOALS: pallaitive  CURRENT/MOST RECENT THERAPY : Gilteritinib     MDS (myelodysplastic syndrome) (HCC) (Resolved)  08/28/2018 Initial Diagnosis   MDS (myelodysplastic syndrome) (HCC)   Acute myeloid leukemia not having achieved remission (HCC)[place]/15/2020 Initial Diagnosis   AML (acute myeloid leukemia) with failed remission (HCC)[place]6/15/2020 - 12/22/2018 Chemotherapy   The patient had azaCITIDine (VIDAZA) chemo injection 105 mg, 75 mg/m2 = 105 mg, Subcutaneous,  Once, 1 of 4 cycles Administration: 105 mg (11/25/2018), 105 mg (11/26/2018), 105 mg (11/27/2018), 105 mg (11/28/2018), 105 mg (11/29/2018)  for chemotherapy treatment.    01/27/2019 -  Chemotherapy   The patient had palonosetron (ALOXI) injection 0.25 mg, 0.25 mg, Intravenous,  Once, 1 of 4 cycles Administration: 0.25 mg (01/27/2019), 0.25 mg (01/29/2019), 0.25 mg (01/31/2019) azaCITIDine (VIDAZA) 100 mg in sodium chloride 0.9 % 50 mL chemo infusion, 75 mg/m2 = 100 mg, Intravenous, Once, 1 of 4 cycles Administration: 100 mg (01/27/2019), 100 mg (01/28/2019), 100 mg (01/29/2019), 100 mg (01/30/2019), 100 mg (01/31/2019)  for chemotherapy treatment.      HISTORY OF PRESENTING ILLNESS:  Kathryn Hahn 85 y83.  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  Patient had episode of heartburn-which resolved after taking Pepcid.  Currently none.  Patient complains of fatigue.  Complains of easy bruising.  Bruising/blood blisters in the mouth.  No nosebleeds.  No obvious nosebleeds.  Review of Systems  Constitutional: Positive for malaise/fatigue. Negative for chills, diaphoresis, fever and weight loss.  HENT: Negative for nosebleeds and sore throat.   Eyes: Negative for double vision.  Respiratory: Negative for cough, hemoptysis, sputum production and wheezing.   Cardiovascular: Negative for chest pain, palpitations and orthopnea.  Gastrointestinal: Negative for abdominal pain, blood in stool, constipation, diarrhea, heartburn, melena, nausea and vomiting.  Genitourinary: Negative for dysuria, frequency and urgency.  Musculoskeletal: Positive for back pain. Negative for joint pain.  Skin: Negative.  Negative for itching and rash.  Neurological: Negative for dizziness, tingling, focal weakness, weakness and headaches.  Endo/Heme/Allergies: Bruises/bleeds easily.  Psychiatric/Behavioral: Negative for depression. The patient is not nervous/anxious and does not have insomnia.     MEDICAL HISTORY:  Past Medical History:  Diagnosis Date  . Anemia   . Arthritis   . Breast cancer ([place]) 2003   RT LUMPECTOMY  . Edema leg   . History of breast cancer 2003   post lumpectomy  . History of kidney stones   . Hyperlipemia, mixed   . Hypertension, essential   . Leukemia ([place])   . Leukocytosis   . MDS (myelodysplastic syndrome) ([place])   . Personal history of radiation therapy 2003   BREAST CA  . Renal stones   . Vitamin D deficiency     SURGICAL HISTORY: Past Surgical History:  Procedure Laterality Date  . BREAST BIOPSY Right 2003   Postive for cancer  . BREAST LUMPECTOMY Right 2003   BREAST CA  . KIDNEY STONE SURGERY Right     SOCIAL HISTORY: Social  History   Socioeconomic History  . Marital status: Married    Spouse name: Not on file  . Number of children: Not on file  . Years of education: Not on file  . Highest education level: Not on file  Occupational History  . Not on file  Tobacco Use  . Smoking status: Never Smoker  . Smokeless tobacco: Never Used  Substance and Sexual Activity  . Alcohol use: Yes    Alcohol/week: 3.0 standard drinks    Types: 3 Glasses of wine per week  . Drug use: No  . Sexual activity: Not on file  Other Topics Concern  . Not on file  Social History Narrative    No smoking/alcohol; lives in [place] with family.  She lives in assisted living with her husband.   Social Determinants of Health   Financial Resource Strain:   . Difficulty of Paying Living Expenses: Not on file  Food Insecurity:   . Worried About Charity fundraiser in the Last Year: Not on file  . Ran Out of Food in the Last Year: Not on file  Transportation Needs:   . Lack of Transportation (Medical): Not on file  . Lack of Transportation (Non-Medical): Not on file  Physical Activity:   . Days of Exercise per Week: Not on file  . Minutes of Exercise per Session: Not on file  Stress:   . Feeling of Stress : Not on file  Social Connections:   . Frequency of Communication with Friends and Family: Not on file  . Frequency of Social Gatherings with Friends  and Family: Not on file  . Attends Religious Services: Not on file  . Active Member of Clubs or Organizations: Not on file  . Attends Archivist Meetings: Not on file  . Marital Status: Not on file  Intimate Partner Violence:   . Fear of Current or Ex-Partner: Not on file  . Emotionally Abused: Not on file  . Physically Abused: Not on file  . Sexually Abused: Not on file    FAMILY HISTORY: Family History  Problem Relation Age of Onset  . Stroke Mother   . Hypertension Father   . Stroke Father   . Breast cancer Neg Hx     ALLERGIES:  is allergic to  terbinafine.  MEDICATIONS:  Current Outpatient Medications  Medication Sig Dispense Refill  . acetaminophen (TYLENOL) 325 MG tablet Take 325 mg by mouth every 6 (six) hours as needed for mild pain or moderate pain.     Marland Kitchen acyclovir (ZOVIRAX) 400 MG tablet ONE PILL A DAY [TO PREVENT SHINGLES] 90 tablet 1  . atenolol (TENORMIN) 50 MG tablet Take 1 tablet by mouth 2 (two) times daily.    . calcium carbonate (OSCAL) 1500 (600 Ca) MG TABS tablet Take 600 mg of elemental calcium by mouth daily with breakfast.    . Cholecalciferol (VITAMIN D) 2000 units tablet Take 1 tablet by mouth daily.    Marland Kitchen eltrombopag (PROMACTA) 50 MG tablet Take 1 tablet (50 mg total) by mouth daily. Take on an empty stomach 1 hour before a meal or 2 hours after (Patient not taking: Reported on 06/20/2019) 30 tablet 3  . Gilteritinib Fumarate (XOSPATA) 40 MG TABS Take 120 mg by mouth daily. 90 tablet 2  . heparin lock flush 100 UNIT/ML SOLN injection Inject into the vein.    . Infant Care Products (DERMACLOUD) CREA Apply topically.    Marland Kitchen KLOR-CON M20 20 MEQ tablet TAKE 1 TABLET BY MOUTH TWICE A DAY 180 tablet 0  . levofloxacin (LEVAQUIN) 250 MG tablet Take 1 tablet (250 mg total) by mouth daily. 30 tablet 3  . losartan-hydrochlorothiazide (HYZAAR) 50-12.5 MG tablet Take 1 tablet by mouth daily.    . magic mouthwash w/lidocaine SOLN Take 5 mLs by mouth 4 (four) times daily as needed for mouth pain. 480 mL 3  . magnesium hydroxide (MILK OF MAGNESIA) 400 MG/5ML suspension Take 5 mLs by mouth every 4 (four) hours as needed.    . ondansetron (ZOFRAN) 8 MG tablet Take by mouth every 8 (eight) hours as needed for nausea or vomiting.    . polyethylene glycol (MIRALAX / GLYCOLAX) 17 g packet Take 17 g by mouth daily.    . posaconazole (NOXAFIL) 100 MG TBEC delayed-release tablet Take 1 tablet (100 mg total) by mouth daily. 30 tablet 3  . Potassium Chloride ER 20 MEQ TBCR Take 20 mEq by mouth daily. 60 tablet 1  . senna (SENOKOT) 8.6 MG  tablet Take 2 tablets by mouth 2 (two) times daily.     No current facility-administered medications for this visit.   Facility-Administered Medications Ordered in Other Visits  Medication Dose Route Frequency Provider Last Rate Last Admin  . 0.9 %  sodium chloride infusion   Intravenous Continuous Jacquelin Hawking, NP   Stopped at 02/05/19 1630  . diphenhydrAMINE (BENADRYL) capsule 25 mg  25 mg Oral Once Charlaine Dalton R, MD      . heparin lock flush 100 unit/mL  250 Units Intravenous Once Cammie Sickle, MD  PHYSICAL EXAMINATION: ECOG PERFORMANCE STATUS: 1 - Symptomatic but completely ambulatory  Vitals:   07/14/19 0840  BP: (!) 183/103  Pulse: 71  Temp: (!) 97.3 F (36.3 C)   Filed Weights   07/14/19 0840  Weight: 99 lb 9.6 oz (45.2 kg)    Physical Exam  Constitutional: She is oriented to person, place, and time and well-developed, well-nourished, and in no distress.  PICC line in place.  No active bleeding.  Masked.  Walking herself.  Alone.  HENT:  Head: Normocephalic and atraumatic.  Mouth/Throat: Oropharynx is clear and moist. No oropharyngeal exudate.  Ecchymosis noted in the buccal mucosa.  Eyes: Conjunctivae are normal. No scleral icterus.  Cardiovascular: Normal rate and regular rhythm.  Pulmonary/Chest: Effort normal. No respiratory distress.  Abdominal: Soft. Bowel sounds are normal. She exhibits no distension and no mass. There is no abdominal tenderness. There is no rebound and no guarding.  Musculoskeletal:        General: No tenderness or edema. Normal range of motion.     Cervical back: Normal range of motion and neck supple.  Neurological: She is alert and oriented to person, place, and time.  Skin: Skin is warm.  Multiple bruises on lower legs, few on hands and arms. Torso bruising improved/mostly resolved  Psychiatric: Affect normal.    LABORATORY DATA:  I have reviewed the data as listed Lab Results  Component Value Date   WBC  6.4 07/14/2019   HGB 7.6 (L) 07/14/2019   HCT 24.3 (L) 07/14/2019   MCV 87.7 07/14/2019   PLT 10 (LL) 07/14/2019   Recent Labs    06/30/19 0853 07/07/19 0854 07/14/19 0814  NA 138 136 136  K 3.8 3.9 3.9  CL 100 100 101  CO2 '26 25 24  '$ GLUCOSE 150* 168* 177*  BUN 34* 33* 39*  CREATININE 1.08* 0.75 0.93  CALCIUM 9.4 8.9 9.0  GFRNONAA 47* >60 57*  GFRAA 55* >60 >60  PROT 6.3* 5.9* 6.1*  ALBUMIN 3.6 3.3* 3.3*  AST 55* 80* 102*  ALT 31 34 38  ALKPHOS 75 81 88  BILITOT 0.6 0.5 0.5    RADIOGRAPHIC STUDIES: I have personally reviewed the radiological images as listed and agreed with the findings in the report. CT MAXILLOFACIAL W CONTRAST  Result Date: 06/27/2019 CLINICAL DATA:  Left-sided facial swelling. AML. Symptoms began today. EXAM: CT MAXILLOFACIAL WITH CONTRAST TECHNIQUE: Multidetector CT imaging of the maxillofacial structures was performed with intravenous contrast. Multiplanar CT image reconstructions were also generated. CONTRAST:  68m OMNIPAQUE IOHEXOL 300 MG/ML  SOLN COMPARISON:  01/09/2017 FINDINGS: Osseous: Normal. No evidence of active dental or periodontal disease. Orbits: Normal Sinuses: Clear and normal. Soft tissues: Inflammatory swelling of the left parotid gland diffusely. No evidence of mass lesion or stone. Nonspecific acute parotitis is usually viral. Right parotid gland is normal. Both submandibular glands are normal. Limited intracranial: Normal Left jugular vein is not yet opacified. This is presumed represent an artifact of delayed filling rather than thrombosis. IMPRESSION: Enlarged and edematous left parotid gland consistent with acute left parotitis. No evidence of drainable abscess, mass or stone. Most common etiology is viral inflammation. Electronically Signed   By: MNelson ChimesM.D.   On: 06/27/2019 14:30   CT BONE MARROW BIOPSY & ASPIRATION  Result Date: 06/17/2019 INDICATION: AML EXAM: CT GUIDED RIGHT ILIAC BONE MARROW ASPIRATION AND CORE BIOPSY Date:   06/17/2019 06/17/2019 10:21 am Radiologist:  M. TDaryll Brod MD Guidance:  CT FLUOROSCOPY TIME:  Fluoroscopy Time:  None. MEDICATIONS: 1% lidocaine local ANESTHESIA/SEDATION: 1.0 mg IV Versed; 50 mcg IV Fentanyl Moderate Sedation Time:  12 minutes The patient was continuously monitored during the procedure by the interventional radiology nurse under my direct supervision. CONTRAST:  None. COMPLICATIONS: None PROCEDURE: Informed consent was obtained from the patient following explanation of the procedure, risks, benefits and alternatives. The patient understands, agrees and consents for the procedure. All questions were addressed. A time out was performed. The patient was positioned prone and non-contrast localization CT was performed of the pelvis to demonstrate the iliac marrow spaces. Maximal barrier sterile technique utilized including caps, mask, sterile gowns, sterile gloves, large sterile drape, hand hygiene, and Betadine prep. Under sterile conditions and local anesthesia, an 11 gauge coaxial bone biopsy needle was advanced into the right iliac marrow space. Needle position was confirmed with CT imaging. Initially, bone marrow aspiration was performed. Next, the 11 gauge outer cannula was utilized to obtain a right iliac bone marrow core biopsy. Needle was removed. Hemostasis was obtained with compression. The patient tolerated the procedure well. Samples were prepared with the cytotechnologist. No immediate complications. IMPRESSION: CT guided right iliac bone marrow aspiration and core biopsy. Electronically Signed   By: Jerilynn Mages.  Shick M.D.   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  currently on Gilterinib 120 mg/day-tolerating well.  Stable.  # Continue current dose at this time.  No evidence of significant hematologic response.  Discussed that we will likely repeat bone marrow biopsy sometime in early March to assess response.  #Left parotitis-s/p Augmentin resolved.  Currently on prophylactic levofloxacin.  # Leucopenia/ anemia/thrombocytopenia-White count-6.4 ANC pending; /hemoglobin 7.6 platelets-10  secondary to underlying acute myeloid leukemia/Gilteritinib.  Proceed with 2 platelet transfusion today/1 PRBC today.   #Patient s/p Covid vaccination-#1-1/05 [#2 on 2/03]; 2 units units of platelets.  # AST slightly enzymes-101 elevated- ? monitor for now.    #Supportive care for AML-  -Continue Noxafil/posaconazole '100mg'$  and acyclovir 400 mg; restart levaquin 250 mg after finishing Augmentin today. Continue PICC line dressing changes weekly  Disposition: #  Today-2 unit platelets today; NO PRBC.   # 02/04-labs-hold tube/'cbc-CMP; possible 1 unit platelets/1 unit PRBC transfusion-  # 02/08-MD; cbc/cmp/ldh hold tube'cbc; possible 2 unit platelets/1 unit PRBC transfusion-  # 02/11-labs-hold tube/'cbc; possible 1 unit platelets/1 unit PRBC transfusion-Dr.B

## 2019-07-14 NOTE — Progress Notes (Signed)
[name] - cancer center lab called a Critical plt count - 10. Read back process performed with Dr. Rogue Bussing - [phone]  Since pt will receive next covid vaccine on 2/3, md would like pt to have 2 units of plts today and 1 unit of rbcs - hgb 7.6

## 2019-07-15 LAB — PREPARE PLATELET PHERESIS
Unit division: 0
Unit division: 0

## 2019-07-15 LAB — BPAM PLATELET PHERESIS
Blood Product Expiration Date: 202102032359
Blood Product Expiration Date: 202102032359
ISSUE DATE / TIME: 202102011315
ISSUE DATE / TIME: 202102011430
Unit Type and Rh: 6200
Unit Type and Rh: 6200

## 2019-07-15 LAB — BPAM RBC
Blood Product Expiration Date: 202102122359
ISSUE DATE / TIME: 202102011111
Unit Type and Rh: 5100

## 2019-07-15 LAB — TYPE AND SCREEN
ABO/RH(D): O POS
Antibody Screen: NEGATIVE
Unit division: 0

## 2019-07-16 ENCOUNTER — Other Ambulatory Visit: Payer: Self-pay

## 2019-07-17 ENCOUNTER — Inpatient Hospital Stay: Payer: Medicare Other

## 2019-07-17 ENCOUNTER — Inpatient Hospital Stay: Payer: Medicare Other | Admitting: *Deleted

## 2019-07-17 ENCOUNTER — Other Ambulatory Visit: Payer: Self-pay | Admitting: *Deleted

## 2019-07-17 ENCOUNTER — Other Ambulatory Visit: Payer: Self-pay

## 2019-07-17 DIAGNOSIS — Z95828 Presence of other vascular implants and grafts: Secondary | ICD-10-CM

## 2019-07-17 DIAGNOSIS — C9200 Acute myeloblastic leukemia, not having achieved remission: Secondary | ICD-10-CM

## 2019-07-17 DIAGNOSIS — C9202 Acute myeloblastic leukemia, in relapse: Secondary | ICD-10-CM | POA: Diagnosis not present

## 2019-07-17 LAB — CBC WITH DIFFERENTIAL/PLATELET
Abs Immature Granulocytes: 0.5 10*3/uL — ABNORMAL HIGH (ref 0.00–0.07)
Basophils Absolute: 0 10*3/uL (ref 0.0–0.1)
Basophils Relative: 0 %
Blasts: 24 %
Eosinophils Absolute: 0.1 10*3/uL (ref 0.0–0.5)
Eosinophils Relative: 3 %
HCT: 28 % — ABNORMAL LOW (ref 36.0–46.0)
Hemoglobin: 8.6 g/dL — ABNORMAL LOW (ref 12.0–15.0)
Lymphocytes Relative: 16 %
Lymphs Abs: 0.8 10*3/uL (ref 0.7–4.0)
MCH: 27 pg (ref 26.0–34.0)
MCHC: 30.7 g/dL (ref 30.0–36.0)
MCV: 87.8 fL (ref 80.0–100.0)
Metamyelocytes Relative: 11 %
Monocytes Absolute: 1.4 10*3/uL — ABNORMAL HIGH (ref 0.1–1.0)
Monocytes Relative: 30 %
Neutro Abs: 0.8 10*3/uL — ABNORMAL LOW (ref 1.7–7.7)
Neutrophils Relative %: 16 %
Platelets: 11 10*3/uL — CL (ref 150–400)
RBC: 3.19 MIL/uL — ABNORMAL LOW (ref 3.87–5.11)
RDW: 17.8 % — ABNORMAL HIGH (ref 11.5–15.5)
WBC: 4.8 10*3/uL (ref 4.0–10.5)
nRBC: 6.6 % — ABNORMAL HIGH (ref 0.0–0.2)

## 2019-07-17 LAB — COMPREHENSIVE METABOLIC PANEL
ALT: 40 U/L (ref 0–44)
AST: 101 U/L — ABNORMAL HIGH (ref 15–41)
Albumin: 3.3 g/dL — ABNORMAL LOW (ref 3.5–5.0)
Alkaline Phosphatase: 82 U/L (ref 38–126)
Anion gap: 10 (ref 5–15)
BUN: 33 mg/dL — ABNORMAL HIGH (ref 8–23)
CO2: 24 mmol/L (ref 22–32)
Calcium: 8.8 mg/dL — ABNORMAL LOW (ref 8.9–10.3)
Chloride: 103 mmol/L (ref 98–111)
Creatinine, Ser: 0.79 mg/dL (ref 0.44–1.00)
GFR calc Af Amer: >60 mL/min (ref >60)
GFR calc non Af Amer: >60 mL/min (ref >60)
Glucose, Bld: 102 mg/dL — ABNORMAL HIGH (ref 70–99)
Potassium: 3.6 mmol/L (ref 3.5–5.1)
Sodium: 137 mmol/L (ref 135–145)
Total Bilirubin: 0.6 mg/dL (ref 0.3–1.2)
Total Protein: 6 g/dL — ABNORMAL LOW (ref 6.5–8.1)

## 2019-07-17 LAB — SAMPLE TO BLOOD BANK

## 2019-07-17 MED ORDER — HEPARIN SOD (PORK) LOCK FLUSH 100 UNIT/ML IV SOLN
250.0000 [IU] | Freq: Once | INTRAVENOUS | Status: AC
  Administered 2019-07-17: 13:00:00 250 [IU] via INTRAVENOUS
  Filled 2019-07-17: qty 5

## 2019-07-17 MED ORDER — SODIUM CHLORIDE 0.9% IV SOLUTION
250.0000 mL | Freq: Once | INTRAVENOUS | Status: AC
  Administered 2019-07-17: 10:00:00 250 mL via INTRAVENOUS
  Filled 2019-07-17: qty 250

## 2019-07-17 MED ORDER — ACETAMINOPHEN 325 MG PO TABS
650.0000 mg | ORAL_TABLET | Freq: Once | ORAL | Status: AC
  Administered 2019-07-17: 10:00:00 650 mg via ORAL
  Filled 2019-07-17: qty 2

## 2019-07-17 MED ORDER — DIPHENHYDRAMINE HCL 25 MG PO CAPS
25.0000 mg | ORAL_CAPSULE | Freq: Once | ORAL | Status: AC
  Administered 2019-07-17: 10:00:00 25 mg via ORAL
  Filled 2019-07-17: qty 1

## 2019-07-17 MED ORDER — SODIUM CHLORIDE 0.9% FLUSH
10.0000 mL | Freq: Once | INTRAVENOUS | Status: AC
  Administered 2019-07-17: 10 mL via INTRAVENOUS
  Filled 2019-07-17: qty 10

## 2019-07-17 NOTE — Progress Notes (Signed)
Per Dr. Rogue Bussing pt to receive one unit of platelets only at this time. Pt aware and agrees with plan.   1030: B/P 185/96, Pt denies any concerns. Pt states "I had the second Covid shot yesterday, It could be from that". Pt reports increased fatigue, pt denies any other symptoms including headaches, blurred vision or chest pains. Dr. Rogue Bussing aware, per Dr. Rogue Bussing okay to proceed with Platelet transfusion, pt to monitor b/p at home and contact PCP if b/p remains elevated. Pt aware and verbalizes understanding. Pt educated when to seek emergency care, pt verbalizes understanding.

## 2019-07-18 LAB — BPAM PLATELET PHERESIS
Blood Product Expiration Date: 202102072359
ISSUE DATE / TIME: 202102041201
Unit Type and Rh: 5100

## 2019-07-18 LAB — PREPARE PLATELET PHERESIS: Unit division: 0

## 2019-07-21 ENCOUNTER — Other Ambulatory Visit: Payer: Self-pay

## 2019-07-22 ENCOUNTER — Inpatient Hospital Stay: Payer: Medicare Other

## 2019-07-22 ENCOUNTER — Ambulatory Visit: Payer: Medicare Other | Admitting: Oncology

## 2019-07-22 ENCOUNTER — Inpatient Hospital Stay (HOSPITAL_BASED_OUTPATIENT_CLINIC_OR_DEPARTMENT_OTHER): Payer: Medicare Other | Admitting: Internal Medicine

## 2019-07-22 ENCOUNTER — Inpatient Hospital Stay (HOSPITAL_BASED_OUTPATIENT_CLINIC_OR_DEPARTMENT_OTHER): Payer: Medicare Other | Admitting: Hospice and Palliative Medicine

## 2019-07-22 ENCOUNTER — Inpatient Hospital Stay: Payer: Medicare Other | Admitting: *Deleted

## 2019-07-22 ENCOUNTER — Other Ambulatory Visit: Payer: Self-pay

## 2019-07-22 ENCOUNTER — Other Ambulatory Visit: Payer: Self-pay | Admitting: *Deleted

## 2019-07-22 VITALS — BP 168/82 | HR 70 | Temp 97.0°F | Resp 18 | Wt 100.0 lb

## 2019-07-22 DIAGNOSIS — Z515 Encounter for palliative care: Secondary | ICD-10-CM | POA: Diagnosis not present

## 2019-07-22 DIAGNOSIS — C9200 Acute myeloblastic leukemia, not having achieved remission: Secondary | ICD-10-CM

## 2019-07-22 DIAGNOSIS — C9202 Acute myeloblastic leukemia, in relapse: Secondary | ICD-10-CM | POA: Diagnosis not present

## 2019-07-22 DIAGNOSIS — D696 Thrombocytopenia, unspecified: Secondary | ICD-10-CM | POA: Diagnosis not present

## 2019-07-22 DIAGNOSIS — Z452 Encounter for adjustment and management of vascular access device: Secondary | ICD-10-CM

## 2019-07-22 LAB — CBC WITH DIFFERENTIAL/PLATELET
Abs Immature Granulocytes: 0 10*3/uL (ref 0.00–0.07)
Band Neutrophils: 1 %
Basophils Absolute: 0 10*3/uL (ref 0.0–0.1)
Basophils Relative: 0 %
Blasts: 10 %
Eosinophils Absolute: 0.1 10*3/uL (ref 0.0–0.5)
Eosinophils Relative: 1 %
HCT: 25.9 % — ABNORMAL LOW (ref 36.0–46.0)
Hemoglobin: 7.8 g/dL — ABNORMAL LOW (ref 12.0–15.0)
Lymphocytes Relative: 23 %
Lymphs Abs: 1.2 10*3/uL (ref 0.7–4.0)
MCH: 26.5 pg (ref 26.0–34.0)
MCHC: 30.1 g/dL (ref 30.0–36.0)
MCV: 88.1 fL (ref 80.0–100.0)
Monocytes Absolute: 2.2 10*3/uL — ABNORMAL HIGH (ref 0.1–1.0)
Monocytes Relative: 42 %
Neutro Abs: 1.3 10*3/uL — ABNORMAL LOW (ref 1.7–7.7)
Neutrophils Relative %: 23 %
Platelets: 7 10*3/uL — CL (ref 150–400)
RBC: 2.94 MIL/uL — ABNORMAL LOW (ref 3.87–5.11)
RDW: 18.3 % — ABNORMAL HIGH (ref 11.5–15.5)
Smear Review: DECREASED
WBC: 5.3 10*3/uL (ref 4.0–10.5)
nRBC: 4.6 % — ABNORMAL HIGH (ref 0.0–0.2)

## 2019-07-22 LAB — LACTATE DEHYDROGENASE: LDH: 2394 U/L — ABNORMAL HIGH (ref 98–192)

## 2019-07-22 LAB — COMPREHENSIVE METABOLIC PANEL
ALT: 45 U/L — ABNORMAL HIGH (ref 0–44)
AST: 95 U/L — ABNORMAL HIGH (ref 15–41)
Albumin: 3.2 g/dL — ABNORMAL LOW (ref 3.5–5.0)
Alkaline Phosphatase: 86 U/L (ref 38–126)
Anion gap: 13 (ref 5–15)
BUN: 36 mg/dL — ABNORMAL HIGH (ref 8–23)
CO2: 24 mmol/L (ref 22–32)
Calcium: 9 mg/dL (ref 8.9–10.3)
Chloride: 99 mmol/L (ref 98–111)
Creatinine, Ser: 0.91 mg/dL (ref 0.44–1.00)
GFR calc Af Amer: >60 mL/min (ref >60)
GFR calc non Af Amer: 58 mL/min — ABNORMAL LOW (ref >60)
Glucose, Bld: 201 mg/dL — ABNORMAL HIGH (ref 70–99)
Potassium: 4.2 mmol/L (ref 3.5–5.1)
Sodium: 136 mmol/L (ref 135–145)
Total Bilirubin: 0.5 mg/dL (ref 0.3–1.2)
Total Protein: 5.7 g/dL — ABNORMAL LOW (ref 6.5–8.1)

## 2019-07-22 LAB — SAMPLE TO BLOOD BANK

## 2019-07-22 MED ORDER — SODIUM CHLORIDE 0.9% IV SOLUTION
250.0000 mL | Freq: Once | INTRAVENOUS | Status: AC
  Administered 2019-07-22: 250 mL via INTRAVENOUS
  Filled 2019-07-22: qty 250

## 2019-07-22 MED ORDER — SODIUM CHLORIDE 0.9% IV SOLUTION
250.0000 mL | Freq: Once | INTRAVENOUS | Status: AC
  Filled 2019-07-22: qty 250

## 2019-07-22 MED ORDER — DIPHENHYDRAMINE HCL 25 MG PO CAPS: 25.0000 mg | ORAL_CAPSULE | Freq: Once | ORAL | Status: DC

## 2019-07-22 MED ORDER — SODIUM CHLORIDE 0.9% FLUSH
10.0000 mL | Freq: Once | INTRAVENOUS | Status: AC
  Administered 2019-07-22: 09:00:00 10 mL via INTRAVENOUS
  Filled 2019-07-22: qty 10

## 2019-07-22 MED ORDER — ACETAMINOPHEN 325 MG PO TABS
650.0000 mg | ORAL_TABLET | Freq: Once | ORAL | Status: AC
  Administered 2019-07-22: 650 mg via ORAL
  Filled 2019-07-22: qty 2

## 2019-07-22 MED ORDER — HEPARIN SOD (PORK) LOCK FLUSH 100 UNIT/ML IV SOLN
INTRAVENOUS | Status: AC
  Filled 2019-07-22: qty 5

## 2019-07-22 NOTE — Progress Notes (Signed)
[facility] OFFICE PROGRESS NOTE  Patient Care Team: Rusty Aus, MD as PCP - General (Internal Medicine)  CHIEF COMPLAINTS/PURPOSE OF CONSULTATION: Acute myeloid leukemia   Oncology History Overview Note  # SEP 2017- MYELOPROLIFERATIVE NEOPLASM- [WBC- 24; normal Hb/platelets] hypercellular bone marrow 90-95% proliferation of myeloid cells in various stages of maturation; relative erythroid hypoplasia and proliferation of atypical megakaryocytes; no increase in blasts; peripheral blood Bcr-Abl-NEG; Cytogenetics- WNL. NEG- Jak-2/MPL/CALR [place] limited- mild splenomegaly [~10cm; 463cm3]; OCT 2017- second opinion at [facility]. Surveillance.   # With progressive leukocytosis we evaluated her a month ago and sent BM for exam. She has a progressive leukocytosis so hydrea 576m daily was started on 10/20/2016 and increased to 2gm daily then back down to one daily, then 5 days per week over time due to counts drop. Then we held her hydrea since 04/09/2018 due to continued declining Hb and Plt.s  BMB 06/24/18 showed markedly hypercellular BM 95% with myeloid hyperplasia and atypical megakaryocytic hyperplasia. No significant increase in blasts. 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 BCR / ABL negative, FISH normal. Flow Showed 2%CD34-positive myeloid blasts. Myeloid precursors with low side scatter. Pathogenic variants were detected in the ASXL1, CCND2, CUX1, and U2AF1 genes.  # March 2020- wbc- 14/ Hb 9.5/ platelets- 54.   ---------------------------------------------------   # June 8th 2020- Acute myeloid leukemia [peripheral blood flow cytometry;NGS- pending]- June 15th Vidaza [SQ 1-5 + Venatoclax- #1in pt]; tumor lysis prophylaxis  # Day #28 bone marrow biopsy-  7/14- BMx- NO BLASTS; positive for dysplastic changes. OFF Venatoclax [since 7/17] sec to cytopenias.  #817 cycle #2 Vidaza+ venetoclax 100 mg; stop venetoclax on  8/24 [severe pancytopenia]   # 02/18/2019-start gilteritinib 80 mg a day; HELD on 9/14 [sec to severe pan]  # NOV 9th/2020-BMBx- RELAPSED AML; NOV 11th- STARTED GILTERITINIB 424mday; NOV17th 2020- PROMACTA 5067may; NOV 30th- inrease Gilt to 80 mg/day; DEC 31st-STOP Gilt/ Promatca [no reponse; JAN 5th BMBx- partial response; JAN 8th 2021-PROCEED with GILTERTINIB 120m6my.    # AUG 2020- AST elevation- 248-?sec to Posonoazole; RESOLVED; continue at lower dose 100 mg/day.   # 2002 [[place]] BREAST CA s/p Lumpect RT; [? Stage I] no chemo s/p AI  # F-ONE HEM: FLT3 ALTERATION NOTED  # JAN 2021- 1/18-Palliative care evaluation.   DIAGNOSIS: ACUTE MYELOID LEUKEMIA GOALS: pallaitive  CURRENT/MOST RECENT THERAPY : Gilteritinib     MDS (myelodysplastic syndrome) (HCC) (Resolved)  08/28/2018 Initial Diagnosis   MDS (myelodysplastic syndrome) (HCC)   Acute myeloid leukemia not having achieved remission (HCC)[place]/15/2020 Initial Diagnosis   AML (acute myeloid leukemia) with failed remission (HCC)[place]6/15/2020 - 12/22/2018 Chemotherapy   The patient had azaCITIDine (VIDAZA) chemo injection 105 mg, 75 mg/m2 = 105 mg, Subcutaneous,  Once, 1 of 4 cycles Administration: 105 mg (11/25/2018), 105 mg (11/26/2018), 105 mg (11/27/2018), 105 mg (11/28/2018), 105 mg (11/29/2018)  for chemotherapy treatment.    01/27/2019 -  Chemotherapy   The patient had palonosetron (ALOXI) injection 0.25 mg, 0.25 mg, Intravenous,  Once, 1 of 4 cycles Administration: 0.25 mg (01/27/2019), 0.25 mg (01/29/2019), 0.25 mg (01/31/2019) azaCITIDine (VIDAZA) 100 mg in sodium chloride 0.9 % 50 mL chemo infusion, 75 mg/m2 = 100 mg, Intravenous, Once, 1 of 4 cycles Administration: 100 mg (01/27/2019), 100 mg (01/28/2019), 100 mg (01/29/2019), 100 mg (01/30/2019), 100 mg (01/31/2019)  for chemotherapy treatment.      HISTORY OF PRESENTING ILLNESS:  Kathryn Hahn 85 y83.  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  In the interim patient was evaluated by Dr.Rizzeri at [facility] consult.  Complains of easy bruising; gum bleeding and also nosebleeds and bleeding from the right ear.  No fevers and chills.  No nausea vomiting.  No abdominal pain.  Review of Systems  Constitutional: Positive for malaise/fatigue. Negative for chills, diaphoresis, fever and weight loss.  HENT: Negative for nosebleeds and sore throat.   Eyes: Negative for double vision.  Respiratory: Negative for cough, hemoptysis, sputum production and wheezing.   Cardiovascular: Negative for chest pain, palpitations and orthopnea.  Gastrointestinal: Negative for abdominal pain, blood in stool, constipation, diarrhea, heartburn, melena, nausea and vomiting.  Genitourinary: Negative for dysuria, frequency and urgency.  Musculoskeletal: Positive for back pain. Negative for joint pain.  Skin: Negative.  Negative for itching and rash.  Neurological: Negative for dizziness, tingling, focal weakness, weakness and headaches.  Endo/Heme/Allergies: Bruises/bleeds easily.  Psychiatric/Behavioral: Negative for depression. The patient is not nervous/anxious and does not have insomnia.     MEDICAL HISTORY:  Past Medical History:  Diagnosis Date  . Anemia   . Arthritis   . Breast cancer ([place]) 2003   RT LUMPECTOMY  . Edema leg   . History of breast cancer 2003   post lumpectomy  . History of kidney stones   . Hyperlipemia, mixed   . Hypertension, essential   . Leukemia ([place])   . Leukocytosis   . MDS (myelodysplastic syndrome) ([place])   . Personal history of radiation therapy 2003   BREAST CA  . Renal stones   . Vitamin D deficiency     SURGICAL HISTORY: Past Surgical History:  Procedure Laterality Date  . BREAST BIOPSY Right 2003   Postive for cancer  . BREAST LUMPECTOMY Right 2003   BREAST CA  . KIDNEY STONE SURGERY Right     SOCIAL  HISTORY: Social History   Socioeconomic History  . Marital status: Married    Spouse name: Not on file  . Number of children: Not on file  . Years of education: Not on file  . Highest education level: Not on file  Occupational History  . Not on file  Tobacco Use  . Smoking status: Never Smoker  . Smokeless tobacco: Never Used  Substance and Sexual Activity  . Alcohol use: Yes    Alcohol/week: 3.0 standard drinks    Types: 3 Glasses of wine per week  . Drug use: No  . Sexual activity: Not on file  Other Topics Concern  . Not on file  Social History Narrative    No smoking/alcohol; lives in [place] with family.  She lives in assisted living with her husband.   Social Determinants of Health   Financial Resource Strain:   . Difficulty of Paying Living Expenses: Not on file  Food Insecurity:   . Worried About Charity fundraiser in the Last Year: Not on file  . Ran Out of Food in the Last Year: Not on file  Transportation Needs:   . Lack of Transportation (Medical): Not on file  . Lack of Transportation (Non-Medical): Not on file  Physical Activity:   . Days of Exercise per Week: Not on file  . Minutes of Exercise per Session: Not on file  Stress:   . Feeling of Stress : Not on file  Social Connections:   . Frequency of Communication with Friends and Family: Not on file  . Frequency  of Social Gatherings with Friends and Family: Not on file  . Attends Religious Services: Not on file  . Active Member of Clubs or Organizations: Not on file  . Attends Archivist Meetings: Not on file  . Marital Status: Not on file  Intimate Partner Violence:   . Fear of Current or Ex-Partner: Not on file  . Emotionally Abused: Not on file  . Physically Abused: Not on file  . Sexually Abused: Not on file    FAMILY HISTORY: Family History  Problem Relation Age of Onset  . Stroke Mother   . Hypertension Father   . Stroke Father   . Breast cancer Neg Hx     ALLERGIES:   is allergic to terbinafine.  MEDICATIONS:  Current Outpatient Medications  Medication Sig Dispense Refill  . acetaminophen (TYLENOL) 325 MG tablet Take 325 mg by mouth every 6 (six) hours as needed for mild pain or moderate pain.     Marland Kitchen acyclovir (ZOVIRAX) 400 MG tablet ONE PILL A DAY [TO PREVENT SHINGLES] 90 tablet 1  . atenolol (TENORMIN) 50 MG tablet Take 1 tablet by mouth 2 (two) times daily.    . calcium carbonate (OSCAL) 1500 (600 Ca) MG TABS tablet Take 600 mg of elemental calcium by mouth daily with breakfast.    . Cholecalciferol (VITAMIN D) 2000 units tablet Take 1 tablet by mouth daily.    . Gilteritinib Fumarate (XOSPATA) 40 MG TABS Take 120 mg by mouth daily. 90 tablet 2  . heparin lock flush 100 UNIT/ML SOLN injection Inject into the vein.    . Infant Care Products (DERMACLOUD) CREA Apply topically.    Marland Kitchen KLOR-CON M20 20 MEQ tablet TAKE 1 TABLET BY MOUTH TWICE A DAY 180 tablet 0  . levofloxacin (LEVAQUIN) 250 MG tablet Take 1 tablet (250 mg total) by mouth daily. 30 tablet 3  . losartan-hydrochlorothiazide (HYZAAR) 50-12.5 MG tablet Take 1 tablet by mouth daily.    . magic mouthwash w/lidocaine SOLN Take 5 mLs by mouth 4 (four) times daily as needed for mouth pain. 480 mL 3  . magnesium hydroxide (MILK OF MAGNESIA) 400 MG/5ML suspension Take 5 mLs by mouth every 4 (four) hours as needed.    . ondansetron (ZOFRAN) 8 MG tablet Take by mouth every 8 (eight) hours as needed for nausea or vomiting.    . polyethylene glycol (MIRALAX / GLYCOLAX) 17 g packet Take 17 g by mouth daily.    . posaconazole (NOXAFIL) 100 MG TBEC delayed-release tablet Take 1 tablet (100 mg total) by mouth daily. 30 tablet 3  . Potassium Chloride ER 20 MEQ TBCR Take 20 mEq by mouth daily. 60 tablet 1  . senna (SENOKOT) 8.6 MG tablet Take 2 tablets by mouth 2 (two) times daily.    Marland Kitchen eltrombopag (PROMACTA) 50 MG tablet Take 1 tablet (50 mg total) by mouth daily. Take on an empty stomach 1 hour before a meal or 2  hours after (Patient not taking: Reported on 06/20/2019) 30 tablet 3   No current facility-administered medications for this visit.   Facility-Administered Medications Ordered in Other Visits  Medication Dose Route Frequency Provider Last Rate Last Admin  . 0.9 %  sodium chloride infusion (Manually program via Guardrails IV Fluids)  250 mL Intravenous Once Charlaine Dalton R, MD      . 0.9 %  sodium chloride infusion   Intravenous Continuous Jacquelin Hawking, NP   Stopped at 02/05/19 1630  . diphenhydrAMINE (BENADRYL) capsule 25 mg  25 mg Oral Once Charlaine Dalton R, MD      . heparin lock flush 100 unit/mL  250 Units Intravenous Once Cammie Sickle, MD        PHYSICAL EXAMINATION: ECOG PERFORMANCE STATUS: 1 - Symptomatic but completely ambulatory  Vitals:   07/22/19 0850  BP: (!) 168/82  Pulse: 70  Resp: 18  Temp: (!) 97 F (36.1 C)  SpO2: 100%   Filed Weights   07/22/19 0850  Weight: 100 lb (45.4 kg)    Physical Exam  Constitutional: She is oriented to person, place, and time and well-developed, well-nourished, and in no distress.  PICC line in place.  No active bleeding.  Masked.  Walking herself.  Alone.  HENT:  Head: Normocephalic and atraumatic.  Mouth/Throat: Oropharynx is clear and moist. No oropharyngeal exudate.  Ecchymosis noted in the buccal mucosa.  Bleeding noted from the right external ear.  Eyes: Conjunctivae are normal. No scleral icterus.  Cardiovascular: Normal rate and regular rhythm.  Pulmonary/Chest: Effort normal and breath sounds normal. No respiratory distress.  Abdominal: Soft. Bowel sounds are normal. She exhibits no distension and no mass. There is no abdominal tenderness. There is no rebound and no guarding.  Musculoskeletal:        General: No tenderness or edema. Normal range of motion.     Cervical back: Normal range of motion and neck supple.  Neurological: She is alert and oriented to person, place, and time.  Skin: Skin is  warm.  Multiple bruises on lower legs, few on hands and arms. Torso bruising improved/mostly resolved  Psychiatric: Affect normal.    LABORATORY DATA:  I have reviewed the data as listed Lab Results  Component Value Date   WBC 5.3 07/22/2019   HGB 7.8 (L) 07/22/2019   HCT 25.9 (L) 07/22/2019   MCV 88.1 07/22/2019   PLT 7 (LL) 07/22/2019   Recent Labs    07/14/19 0814 07/17/19 0844 07/22/19 0843  NA 136 137 136  K 3.9 3.6 4.2  CL 101 103 99  CO2 _0 GLUCOSE 177* 102* 201*  BUN 39* 33* 36*  CREATININE 0.93 0.79 0.91  CALCIUM 9.0 8.8* 9.0  GFRNONAA 57* >60 58*  GFRAA >60 >60 >60  PROT 6.1* 6.0* 5.7*  ALBUMIN 3.3* 3.3* 3.2*  AST 102* 101* 95*  ALT 38 40 45*  ALKPHOS 88 82 86  BILITOT 0.5 0.6 0.5    RADIOGRAPHIC STUDIES: I have personally reviewed the radiological images as listed and agreed with the findings in the report. CT MAXILLOFACIAL W CONTRAST  Result Date: 06/27/2019 CLINICAL DATA:  Left-sided facial swelling. AML. Symptoms began today. EXAM: CT MAXILLOFACIAL WITH CONTRAST TECHNIQUE: Multidetector CT imaging of the maxillofacial structures was performed with intravenous contrast. Multiplanar CT image reconstructions were also generated. CONTRAST:  61m OMNIPAQUE IOHEXOL 300 MG/ML  SOLN COMPARISON:  01/09/2017 FINDINGS: Osseous: Normal. No evidence of active dental or periodontal disease. Orbits: Normal Sinuses: Clear and normal. Soft tissues: Inflammatory swelling of the left parotid gland diffusely. No evidence of mass lesion or stone. Nonspecific acute parotitis is usually viral. Right parotid gland is normal. Both submandibular glands are normal. Limited intracranial: Normal Left jugular vein is not yet opacified. This is presumed represent an artifact of delayed filling rather than thrombosis. IMPRESSION: Enlarged and edematous left parotid gland consistent with acute left parotitis. No evidence of drainable abscess, mass or stone. Most common etiology is viral  inflammation. Electronically Signed   By: MNelson Chimes  M.D.   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 currently on Gilterinib 120 mg/day-tolerating well.  Stable.  # Continue current dose at this time.  No evidence of significant hematologic response.  Discussed that we will likely repeat bone marrow biopsy sometime in early March to assess response.  Also discussed with Dr.Rizzeri who agrees with the plan.  # Leucopenia/ anemia/thrombocytopenia-White count-5.3;  [place] pending; /hemoglobin 7.8 platelets-7  secondary to underlying acute myeloid leukemia/Gilteritinib.  Proceed with 2 platelet transfusion today/1 PRBC today.   # AST slightly enzymes-101 elevated- ?  Etiology-stable.  Monitor for now  #Supportive care for AML-  -Continue Noxafil/posaconazole 183m and acyclovir 400 mg; restart levaquin 250 mg after finishing Augmentin today. Continue PICC line dressing changes weekly  Disposition: #  Today-2 unit platelets today; 1unit PRBC.   # schedule on 2/12 as planned; 2 unit of platelets.  # # 02/16-MD; cbc/cmp/ldh hold tube'cbc; possible 2 unit platelets/1 unit PRBC transfusion-  # 02/19-labs-hold tube/'cbc; possible 2 unit platelets/1 unit PRBC transfusion-Dr.B

## 2019-07-22 NOTE — Progress Notes (Signed)
Kathryn Hahn  [phone] [phone]   Name: Kathryn Hahn Date: 07/22/2019 MRN: [id number]  DOB: 1933/10/13  Patient Care Team: Rusty Aus, MD as PCP - General (Internal Medicine)    REASON FOR CONSULTATION: Kathryn Hahn is a 86 y.o. 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  Patient is transfusion dependent requiring weekly platelets.  Her overall prognosis is felt to be guarded and treatment is with palliative intent.  She was referred to palliative care to help address goals.  SOCIAL HISTORY:     reports that she has never smoked. She has never used smokeless tobacco. She reports current alcohol use of about 3.0 standard drinks of alcohol per week. She reports that she does not use drugs.   Patient is married and lives at home with her husband.  She has 5 daughters, one of whom lives in [place] and the others live out of state.  Patient worked as an Automotive engineer.  ADVANCE DIRECTIVES:  On file  CODE STATUS: DNR/DNI (MOST form completed on 06/30/19)  PAST MEDICAL HISTORY: Past Medical History:  Diagnosis Date  . Anemia   . Arthritis   . Breast cancer ([place]) 2003   RT LUMPECTOMY  . Edema leg   . History of breast cancer 2003   post lumpectomy  . History of kidney stones   . Hyperlipemia, mixed   . Hypertension, essential   . Leukemia ([place])   . Leukocytosis   . MDS (myelodysplastic syndrome) ([place])   . Personal history of radiation therapy 2003   BREAST CA  . Renal stones   . Vitamin D deficiency     PAST SURGICAL HISTORY:  Past Surgical History:  Procedure Laterality Date  . BREAST BIOPSY Right 2003   Postive for cancer  . BREAST LUMPECTOMY Right 2003   BREAST CA  . KIDNEY STONE SURGERY Right     HEMATOLOGY/ONCOLOGY HISTORY:  Oncology History Overview  Note  # SEP 2017- MYELOPROLIFERATIVE NEOPLASM- [WBC- 44; normal Hb/platelets] hypercellular bone marrow 90-95% proliferation of myeloid cells in various stages of maturation; relative erythroid hypoplasia and proliferation of atypical megakaryocytes; no increase in blasts; peripheral blood Bcr-Abl-NEG; Cytogenetics- WNL. NEG- Jak-2/MPL/CALR [place] limited- mild splenomegaly [~10cm; 463cm3]; OCT 2017- second opinion at [facility]. Surveillance.   # With progressive leukocytosis we evaluated her a month ago and sent BM for exam. She has a progressive leukocytosis so hydrea 552m daily was started on 10/20/2016 and increased to 2gm daily then back down to one daily, then 5 days per week over time due to counts drop. Then we held her hydrea since 04/09/2018 due to continued declining Hb and Plt.s  BMB 06/24/18 showed markedly hypercellular BM 95% with myeloid hyperplasia and atypical megakaryocytic hyperplasia. No significant increase in blasts. 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 BCR / ABL negative, FISH normal. Flow Showed 2%CD34-positive myeloid blasts. Myeloid precursors with low side scatter. Pathogenic variants were detected in the ASXL1, CCND2, CUX1, and U2AF1 genes.  # March 2020- wbc- 14/ Hb 9.5/ platelets- 54.   ---------------------------------------------------   # June 8th 2020- Acute myeloid leukemia [peripheral blood flow cytometry;NGS- pending]- June 15th Vidaza [SQ 1-5 + Venatoclax- #1in pt]; tumor lysis prophylaxis  # Day #28 bone marrow biopsy-  7/14- BMx- NO BLASTS; positive for dysplastic changes. OFF Venatoclax [since 7/17] sec to cytopenias.  #817 cycle #2  Vidaza+ venetoclax 100 mg; stop venetoclax on 8/24 [severe pancytopenia]   # 02/18/2019-start gilteritinib 80 mg a day; HELD on 9/14 [sec to severe pan]  # NOV 9th/2020-BMBx- RELAPSED AML; NOV 11th- STARTED GILTERITINIB 18m/day; NOV17th 2020- PROMACTA 512mday; NOV  30th- inrease Gilt to 80 mg/day; DEC 31st-STOP Gilt/ Promatca [no reponse; JAN 5th BMBx- partial response; JAN 8th 2021-PROCEED with GILTERTINIB [id number].    # AUG 2020- AST elevation- 248-?sec to Posonoazole; RESOLVED; continue at lower dose 100 mg/day.   # 2002 [[place]] BREAST CA s/p Lumpect RT; [? Stage I] no chemo s/p AI  # F-ONE HEM: FLT3 ALTERATION NOTED  # JAN 2021- 1/18-Palliative care evaluation.   DIAGNOSIS: ACUTE MYELOID LEUKEMIA GOALS: pallaitive  CURRENT/MOST RECENT THERAPY : Gilteritinib     MDS (myelodysplastic syndrome) (HCC) (Resolved)  08/28/2018 Initial Diagnosis   MDS (myelodysplastic syndrome) ([facility])   Acute myeloid leukemia not having achieved remission [place]2020 Initial Diagnosis   AML (acute myeloid leukemia) with failed remission ([facility] 11/25/2018 - 12/22/2018 Chemotherapy   The patient had azaCITIDine (VIDAZA) chemo injection 105 mg, 75 mg/m2 = 105 mg, Subcutaneous,  Once, 1 of 4 cycles Administration: 105 mg (11/25/2018), 105 mg (11/26/2018), 105 mg (11/27/2018), 105 mg (11/28/2018), 105 mg (11/29/2018)  for chemotherapy treatment.    01/27/2019 -  Chemotherapy   The patient had palonosetron (ALOXI) injection 0.25 mg, 0.25 mg, Intravenous,  Once, 1 of 4 cycles Administration: 0.25 mg (01/27/2019), 0.25 mg (01/29/2019), 0.25 mg (01/31/2019) azaCITIDine (VIDAZA) 100 mg in sodium chloride 0.9 % 50 mL chemo infusion, 75 mg/m2 = 100 mg, Intravenous, Once, 1 of 4 cycles Administration: 100 mg (01/27/2019), 100 mg (01/28/2019), 100 mg (01/29/2019), 100 mg (01/30/2019), 100 mg (01/31/2019)  for chemotherapy treatment.      ALLERGIES:  is allergic to terbinafine.  MEDICATIONS:  Current Outpatient Medications  Medication Sig Dispense Refill  . acetaminophen (TYLENOL) 325 MG tablet Take 325 mg by mouth every 6 (six) hours as needed for mild pain or moderate pain.     . aMarland Kitchenyclovir (ZOVIRAX) 400 MG tablet ONE PILL A DAY [TO PREVENT SHINGLES] 90 tablet 1  . atenolol  (TENORMIN) 50 MG tablet Take 1 tablet by mouth 2 (two) times daily.    . calcium carbonate (OSCAL) 1500 (600 Ca) MG TABS tablet Take 600 mg of elemental calcium by mouth daily with breakfast.    . Cholecalciferol (VITAMIN D) 2000 units tablet Take 1 tablet by mouth daily.    . eMarland Kitchentrombopag (PROMACTA) 50 MG tablet Take 1 tablet (50 mg total) by mouth daily. Take on an empty stomach 1 hour before a meal or 2 hours after (Patient not taking: Reported on 06/20/2019) 30 tablet 3  . Gilteritinib Fumarate (XOSPATA) 40 MG TABS Take 120 mg by mouth daily. 90 tablet 2  . heparin lock flush 100 UNIT/ML SOLN injection Inject into the vein.    . Infant Care Products (DERMACLOUD) CREA Apply topically.    . KMarland KitchenOR-CON M20 20 MEQ tablet TAKE 1 TABLET BY MOUTH TWICE A DAY 180 tablet 0  . levofloxacin (LEVAQUIN) 250 MG tablet Take 1 tablet (250 mg total) by mouth daily. 30 tablet 3  . losartan-hydrochlorothiazide (HYZAAR) 50-12.5 MG tablet Take 1 tablet by mouth daily.    . magic mouthwash w/lidocaine SOLN Take 5 mLs by mouth 4 (four) times daily as needed for mouth pain. 480 mL 3  . magnesium hydroxide (MILK OF MAGNESIA) 400 MG/5ML suspension Take 5 mLs by mouth every 4 (  four) hours as needed.    . ondansetron (ZOFRAN) 8 MG tablet Take by mouth every 8 (eight) hours as needed for nausea or vomiting.    . polyethylene glycol (MIRALAX / GLYCOLAX) 17 g packet Take 17 g by mouth daily.    . posaconazole (NOXAFIL) 100 MG TBEC delayed-release tablet Take 1 tablet (100 mg total) by mouth daily. 30 tablet 3  . Potassium Chloride ER 20 MEQ TBCR Take 20 mEq by mouth daily. 60 tablet 1  . senna (SENOKOT) 8.6 MG tablet Take 2 tablets by mouth 2 (two) times daily.     No current facility-administered medications for this visit.   Facility-Administered Medications Ordered in Other Visits  Medication Dose Route Frequency Provider Last Rate Last Admin  . 0.9 %  sodium chloride infusion   Intravenous Continuous Jacquelin Hawking, NP    Stopped at 02/05/19 1630  . diphenhydrAMINE (BENADRYL) capsule 25 mg  25 mg Oral Once Charlaine Dalton R, MD      . heparin lock flush 100 unit/mL  250 Units Intravenous Once Cammie Sickle, MD        VITAL SIGNS: There were no vitals taken for this visit. There were no vitals filed for this visit.  Estimated body mass index is 21.64 kg/m as calculated from the following:   Height as of 06/17/19: _0  (1.448 m).   Weight as of an earlier encounter on 07/22/19: 100 lb (45.4 kg).  LABS: CBC:    Component Value Date/Time   WBC 5.3 07/22/2019 0843   HGB 7.8 (L) 07/22/2019 0843   HGB 7.1 (L) 11/25/2018 0904   HCT 25.9 (L) 07/22/2019 0843   PLT 7 (LL) 07/22/2019 0843   MCV 88.1 07/22/2019 0843   NEUTROABS 1.3 (L) 07/22/2019 0843   LYMPHSABS 1.2 07/22/2019 0843   MONOABS 2.2 (H) 07/22/2019 0843   EOSABS 0.1 07/22/2019 0843   BASOSABS 0.0 07/22/2019 0843   Comprehensive Metabolic Panel:    Component Value Date/Time   NA 136 07/22/2019 0843   K 4.2 07/22/2019 0843   CL 99 07/22/2019 0843   CO2 24 07/22/2019 0843   BUN 36 (H) 07/22/2019 0843   CREATININE 0.91 07/22/2019 0843   GLUCOSE 201 (H) 07/22/2019 0843   CALCIUM 9.0 07/22/2019 0843   AST 95 (H) 07/22/2019 0843   ALT 45 (H) 07/22/2019 0843   ALKPHOS 86 07/22/2019 0843   BILITOT 0.5 07/22/2019 0843   PROT 5.7 (L) 07/22/2019 0843   ALBUMIN 3.2 (L) 07/22/2019 0843    RADIOGRAPHIC STUDIES: CT MAXILLOFACIAL W CONTRAST  Result Date: 06/27/2019 CLINICAL DATA:  Left-sided facial swelling. AML. Symptoms began today. EXAM: CT MAXILLOFACIAL WITH CONTRAST TECHNIQUE: Multidetector CT imaging of the maxillofacial structures was performed with intravenous contrast. Multiplanar CT image reconstructions were also generated. CONTRAST:  19m OMNIPAQUE IOHEXOL 300 MG/ML  SOLN COMPARISON:  01/09/2017 FINDINGS: Osseous: Normal. No evidence of active dental or periodontal disease. Orbits: Normal Sinuses: Clear and normal. Soft  tissues: Inflammatory swelling of the left parotid gland diffusely. No evidence of mass lesion or stone. Nonspecific acute parotitis is usually viral. Right parotid gland is normal. Both submandibular glands are normal. Limited intracranial: Normal Left jugular vein is not yet opacified. This is presumed represent an artifact of delayed filling rather than thrombosis. IMPRESSION: Enlarged and edematous left parotid gland consistent with acute left parotitis. No evidence of drainable abscess, mass or stone. Most common etiology is viral inflammation. Electronically Signed   By: MJan FiremanD.  On: 06/27/2019 14:30    PERFORMANCE STATUS (ECOG) : 1 - Symptomatic but completely ambulatory  Review of Systems Unless otherwise noted, a complete review of systems is negative.  Physical Exam General: NAD, frail appearing, thin Pulmonary: Unlabored Extremities: no edema, no joint deformities Skin: no rashes Neurological: Weakness but otherwise nonfocal  IMPRESSION: Follow-up visit today.  Patient is accompanied by her husband.  Patient reports that she is doing about the same.  She continues to have periodic bleeding from gums and nose.  Platelets were 7000 today.  We discussed strategies to avoid bleeding.  Patient is not brushing her teeth but using her finger.  She is using Vaseline as a moisturizer.  Otherwise, patient denies any distressing symptoms or significant changes.  Case discussed with Dr. Rogue Bussing.  Plan is for repeat bone marrow biopsy in March.  PLAN: -Continue current scope of treatment -Follow-up telephone visit in 3 to 4 weeks.   Patient expressed understanding and was in agreement with this plan. She also understands that She can call the clinic at any time with any questions, concerns, or complaints.     Time Total: 20 minutes  Visit consisted of counseling and education dealing with the complex and emotionally intense issues of symptom management and palliative  care in the setting of serious and potentially life-threatening illness.Greater than 50%  of this time was spent counseling and coordinating care related to the above assessment and plan.  Signed by: Altha Harm, PhD, NP-C

## 2019-07-22 NOTE — Assessment & Plan Note (Addendum)
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 currently on Gilterinib 120 mg/day-tolerating well.  Stable.  # Continue current dose at this time.  No evidence of significant hematologic response.  Discussed that we will likely repeat bone marrow biopsy sometime in early March to assess response.  Also discussed with Dr.Rizzeri who agrees with the plan.  # Leucopenia/ anemia/thrombocytopenia-White count-5.3;  [place] pending; /hemoglobin 7.8 platelets-7  secondary to underlying acute myeloid leukemia/Gilteritinib.  Proceed with 2 platelet transfusion today/1 PRBC today.   # AST slightly enzymes-101 elevated- ?  Etiology-stable.  Monitor for now  #Supportive care for AML-  -Continue Noxafil/posaconazole '100mg'$  and acyclovir 400 mg; restart levaquin 250 mg after finishing Augmentin today. Continue PICC line dressing changes weekly  Disposition: #  Today-2 unit platelets today; 1unit PRBC.   # schedule on 2/12 as planned; 2 unit of platelets.  # # 02/16-MD; cbc/cmp/ldh hold tube'cbc; possible 2 unit platelets/1 unit PRBC transfusion-  # 02/19-labs-hold tube/'cbc; possible 2 unit platelets/1 unit PRBC transfusion-Dr.B

## 2019-07-23 LAB — TYPE AND SCREEN
ABO/RH(D): O POS
Antibody Screen: NEGATIVE
Unit division: 0

## 2019-07-23 LAB — BPAM RBC
Blood Product Expiration Date: 202102242359
ISSUE DATE / TIME: 202102091043
Unit Type and Rh: 5100

## 2019-07-23 LAB — BPAM PLATELET PHERESIS
Blood Product Expiration Date: 202102112359
Blood Product Expiration Date: 202102112359
ISSUE DATE / TIME: 202102091244
ISSUE DATE / TIME: 202102091342
Unit Type and Rh: 5100
Unit Type and Rh: 6200

## 2019-07-23 LAB — PREPARE PLATELET PHERESIS
Unit division: 0
Unit division: 0

## 2019-07-24 ENCOUNTER — Other Ambulatory Visit: Payer: Self-pay

## 2019-07-24 LAB — PREPARE RBC (CROSSMATCH)

## 2019-07-25 ENCOUNTER — Other Ambulatory Visit: Payer: Self-pay

## 2019-07-25 ENCOUNTER — Other Ambulatory Visit: Payer: Self-pay | Admitting: *Deleted

## 2019-07-25 ENCOUNTER — Inpatient Hospital Stay: Payer: Medicare Other | Admitting: *Deleted

## 2019-07-25 ENCOUNTER — Other Ambulatory Visit: Payer: Self-pay | Admitting: Internal Medicine

## 2019-07-25 ENCOUNTER — Inpatient Hospital Stay: Payer: Medicare Other

## 2019-07-25 DIAGNOSIS — C9202 Acute myeloblastic leukemia, in relapse: Secondary | ICD-10-CM | POA: Diagnosis not present

## 2019-07-25 DIAGNOSIS — C9200 Acute myeloblastic leukemia, not having achieved remission: Secondary | ICD-10-CM

## 2019-07-25 DIAGNOSIS — Z95828 Presence of other vascular implants and grafts: Secondary | ICD-10-CM

## 2019-07-25 DIAGNOSIS — D696 Thrombocytopenia, unspecified: Secondary | ICD-10-CM

## 2019-07-25 LAB — CBC WITH DIFFERENTIAL/PLATELET
Abs Immature Granulocytes: 0 10*3/uL (ref 0.00–0.07)
Basophils Absolute: 0 10*3/uL (ref 0.0–0.1)
Basophils Relative: 0 %
Blasts: 19 %
Eosinophils Absolute: 0 10*3/uL (ref 0.0–0.5)
Eosinophils Relative: 0 %
HCT: 30.4 % — ABNORMAL LOW (ref 36.0–46.0)
Hemoglobin: 9.4 g/dL — ABNORMAL LOW (ref 12.0–15.0)
Lymphocytes Relative: 21 %
Lymphs Abs: 1.3 10*3/uL (ref 0.7–4.0)
MCH: 27.2 pg (ref 26.0–34.0)
MCHC: 30.9 g/dL (ref 30.0–36.0)
MCV: 88.1 fL (ref 80.0–100.0)
Monocytes Absolute: 2.6 10*3/uL — ABNORMAL HIGH (ref 0.1–1.0)
Monocytes Relative: 43 %
Neutro Abs: 1 10*3/uL — ABNORMAL LOW (ref 1.7–7.7)
Neutrophils Relative %: 17 %
Platelets: 16 10*3/uL — CL (ref 150–400)
RBC: 3.45 MIL/uL — ABNORMAL LOW (ref 3.87–5.11)
RDW: 17.9 % — ABNORMAL HIGH (ref 11.5–15.5)
Smear Review: NORMAL
WBC: 6.1 10*3/uL (ref 4.0–10.5)
nRBC: 3.1 % — ABNORMAL HIGH (ref 0.0–0.2)

## 2019-07-25 LAB — SAMPLE TO BLOOD BANK

## 2019-07-25 LAB — PROTIME-INR
INR: 1.2 (ref 0.8–1.2)
Prothrombin Time: 15.1 seconds (ref 11.4–15.2)

## 2019-07-25 LAB — APTT: aPTT: 32 seconds (ref 24–36)

## 2019-07-25 LAB — FIBRINOGEN: Fibrinogen: 249 mg/dL (ref 210–475)

## 2019-07-25 MED ORDER — SODIUM CHLORIDE 0.9% FLUSH
10.0000 mL | Freq: Once | INTRAVENOUS | Status: AC
  Administered 2019-07-25: 09:00:00 10 mL via INTRAVENOUS
  Filled 2019-07-25: qty 10

## 2019-07-25 MED ORDER — ACETAMINOPHEN 325 MG PO TABS
650.0000 mg | ORAL_TABLET | Freq: Once | ORAL | Status: AC
  Administered 2019-07-25: 650 mg via ORAL
  Filled 2019-07-25: qty 2

## 2019-07-25 MED ORDER — DIPHENHYDRAMINE HCL 25 MG PO CAPS: 25.0000 mg | ORAL_CAPSULE | Freq: Once | ORAL | Status: DC

## 2019-07-25 MED ORDER — SODIUM CHLORIDE 0.9% IV SOLUTION
250.0000 mL | Freq: Once | INTRAVENOUS | Status: AC
  Administered 2019-07-25: 250 mL via INTRAVENOUS
  Filled 2019-07-25: qty 250

## 2019-07-25 MED ORDER — HEPARIN SOD (PORK) LOCK FLUSH 100 UNIT/ML IV SOLN
500.0000 [IU] | Freq: Once | INTRAVENOUS | Status: AC
  Administered 2019-07-25: 500 [IU] via INTRAVENOUS
  Filled 2019-07-25: qty 5

## 2019-07-25 NOTE — Telephone Encounter (Signed)
...    Ref Range & Units 3 d ago  Potassium 3.5 - 5.1 mmol/L 4.2

## 2019-07-26 LAB — PREPARE PLATELET PHERESIS: Unit division: 0

## 2019-07-26 LAB — BPAM PLATELET PHERESIS
Blood Product Expiration Date: 202102142359
ISSUE DATE / TIME: 202102121003
Unit Type and Rh: 5100

## 2019-07-29 ENCOUNTER — Other Ambulatory Visit: Payer: Self-pay

## 2019-07-29 ENCOUNTER — Inpatient Hospital Stay: Payer: Medicare Other | Admitting: *Deleted

## 2019-07-29 ENCOUNTER — Encounter: Payer: Self-pay | Admitting: Nurse Practitioner

## 2019-07-29 ENCOUNTER — Inpatient Hospital Stay (HOSPITAL_BASED_OUTPATIENT_CLINIC_OR_DEPARTMENT_OTHER): Payer: Medicare Other | Admitting: Oncology

## 2019-07-29 ENCOUNTER — Inpatient Hospital Stay: Payer: Medicare Other

## 2019-07-29 ENCOUNTER — Other Ambulatory Visit: Payer: Self-pay | Admitting: *Deleted

## 2019-07-29 VITALS — BP 151/96 | HR 73 | Temp 97.7°F | Resp 18 | Wt 98.6 lb

## 2019-07-29 DIAGNOSIS — D696 Thrombocytopenia, unspecified: Secondary | ICD-10-CM

## 2019-07-29 DIAGNOSIS — Z95828 Presence of other vascular implants and grafts: Secondary | ICD-10-CM

## 2019-07-29 DIAGNOSIS — C9200 Acute myeloblastic leukemia, not having achieved remission: Secondary | ICD-10-CM

## 2019-07-29 DIAGNOSIS — C9202 Acute myeloblastic leukemia, in relapse: Secondary | ICD-10-CM | POA: Diagnosis not present

## 2019-07-29 DIAGNOSIS — Z452 Encounter for adjustment and management of vascular access device: Secondary | ICD-10-CM

## 2019-07-29 LAB — CBC WITH DIFFERENTIAL/PLATELET
Abs Immature Granulocytes: 0 10*3/uL (ref 0.00–0.07)
Basophils Absolute: 0 10*3/uL (ref 0.0–0.1)
Basophils Relative: 0 %
Blasts: 10 %
Eosinophils Absolute: 0 10*3/uL (ref 0.0–0.5)
Eosinophils Relative: 0 %
HCT: 27.8 % — ABNORMAL LOW (ref 36.0–46.0)
Hemoglobin: 8.7 g/dL — ABNORMAL LOW (ref 12.0–15.0)
Lymphocytes Relative: 27 %
Lymphs Abs: 1.6 10*3/uL (ref 0.7–4.0)
MCH: 27.4 pg (ref 26.0–34.0)
MCHC: 31.3 g/dL (ref 30.0–36.0)
MCV: 87.7 fL (ref 80.0–100.0)
Monocytes Absolute: 2.6 10*3/uL — ABNORMAL HIGH (ref 0.1–1.0)
Monocytes Relative: 44 %
Neutro Abs: 1.1 10*3/uL — ABNORMAL LOW (ref 1.7–7.7)
Neutrophils Relative %: 19 %
Platelets: 11 10*3/uL — CL (ref 150–400)
RBC: 3.17 MIL/uL — ABNORMAL LOW (ref 3.87–5.11)
RDW: 17.9 % — ABNORMAL HIGH (ref 11.5–15.5)
WBC: 5.9 10*3/uL (ref 4.0–10.5)
nRBC: 2.5 % — ABNORMAL HIGH (ref 0.0–0.2)

## 2019-07-29 LAB — COMPREHENSIVE METABOLIC PANEL
ALT: 48 U/L — ABNORMAL HIGH (ref 0–44)
AST: 103 U/L — ABNORMAL HIGH (ref 15–41)
Albumin: 3.2 g/dL — ABNORMAL LOW (ref 3.5–5.0)
Alkaline Phosphatase: 92 U/L (ref 38–126)
Anion gap: 13 (ref 5–15)
BUN: 33 mg/dL — ABNORMAL HIGH (ref 8–23)
CO2: 24 mmol/L (ref 22–32)
Calcium: 8.8 mg/dL — ABNORMAL LOW (ref 8.9–10.3)
Chloride: 100 mmol/L (ref 98–111)
Creatinine, Ser: 1 mg/dL (ref 0.44–1.00)
GFR calc Af Amer: >60 mL/min (ref >60)
GFR calc non Af Amer: 52 mL/min — ABNORMAL LOW (ref >60)
Glucose, Bld: 184 mg/dL — ABNORMAL HIGH (ref 70–99)
Potassium: 4 mmol/L (ref 3.5–5.1)
Sodium: 137 mmol/L (ref 135–145)
Total Bilirubin: 0.3 mg/dL (ref 0.3–1.2)
Total Protein: 5.8 g/dL — ABNORMAL LOW (ref 6.5–8.1)

## 2019-07-29 LAB — SAMPLE TO BLOOD BANK

## 2019-07-29 LAB — LACTATE DEHYDROGENASE: LDH: 2479 U/L — ABNORMAL HIGH (ref 98–192)

## 2019-07-29 MED ORDER — HEPARIN SOD (PORK) LOCK FLUSH 100 UNIT/ML IV SOLN
500.0000 [IU] | Freq: Once | INTRAVENOUS | Status: AC
  Administered 2019-07-29: 500 [IU] via INTRAVENOUS
  Filled 2019-07-29: qty 5

## 2019-07-29 MED ORDER — ACETAMINOPHEN 325 MG PO TABS
650.0000 mg | ORAL_TABLET | Freq: Once | ORAL | Status: AC
  Administered 2019-07-29: 650 mg via ORAL
  Filled 2019-07-29: qty 2

## 2019-07-29 MED ORDER — DIPHENHYDRAMINE HCL 25 MG PO CAPS: 25.0000 mg | ORAL_CAPSULE | Freq: Once | ORAL | Status: DC

## 2019-07-29 MED ORDER — SODIUM CHLORIDE 0.9% FLUSH
10.0000 mL | Freq: Once | INTRAVENOUS | Status: AC
  Administered 2019-07-29: 09:00:00 10 mL via INTRAVENOUS
  Filled 2019-07-29: qty 10

## 2019-07-29 MED ORDER — SODIUM CHLORIDE 0.9% IV SOLUTION
250.0000 mL | Freq: Once | INTRAVENOUS | Status: AC
  Administered 2019-07-29: 250 mL via INTRAVENOUS
  Filled 2019-07-29: qty 250

## 2019-07-29 NOTE — Progress Notes (Signed)
Symptom Management Consult note [facility]  [phone] [phone]  Patient Care Team: Rusty Aus, MD as PCP - General (Internal Medicine)   Name of the patient: Kathryn Hahn  [id number]  30-Oct-1933   Date of visit: 07/29/2019   Diagnosis-AML  Chief complaint/ Reason for visit-lab work/possible blood and platelet transfusion  Heme/Onc history:  Oncology History Overview Note  # SEP 2017- MYELOPROLIFERATIVE NEOPLASM- [WBC- 16; normal Hb/platelets] hypercellular bone marrow 90-95% proliferation of myeloid cells in various stages of maturation; relative erythroid hypoplasia and proliferation of atypical megakaryocytes; no increase in blasts; peripheral blood Bcr-Abl-NEG; Cytogenetics- WNL. NEG- Jak-2/MPL/CALR [place] limited- mild splenomegaly [~10cm; 463cm3]; OCT 2017- second opinion at [place]. Surveillance.   # With progressive leukocytosis we evaluated her a month ago and sent BM for exam. She has a progressive leukocytosis so hydrea 5106m daily was started on 10/20/2016 and increased to 2gm daily then back down to one daily, then 5 days per week over time due to counts drop. Then we held her hydrea since 04/09/2018 due to continued declining Hb and Plt.s  BMB 06/24/18 showed markedly hypercellular BM 95% with myeloid hyperplasia and atypical megakaryocytic hyperplasia. No significant increase in blasts. 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 BCR / ABL negative, FISH normal. Flow Showed 2%CD34-positive myeloid blasts. Myeloid precursors with low side scatter. Pathogenic variants were detected in the ASXL1, CCND2, CUX1, and U2AF1 genes.  # March 2020- wbc- 14/ Hb 9.5/ platelets- 54.   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 OFF Venatoclax [since 7/17] sec to cytopenias.  #817 cycle #2 Vidaza+ venetoclax 100 mg; stop venetoclax on 8/24 [severe pancytopenia]   # 02/18/2019-start gilteritinib 80 mg a day; HELD on 9/14 [sec to severe pan]  # NOV 9th/2020-BMBx- RELAPSED AML; NOV 11th- STARTED GILTERITINIB 473mday; NOV17th 2020- PROMACTA 5030may; NOV 30th- inrease Gilt to 80 mg/day; DEC 31st-STOP Gilt/ Promatca [no reponse; JAN 5th BMBx- partial response; JAN 8th 2021-PROCEED with GILTERTINIB 120m45my.    # AUG 2020- AST elevation- 248-?sec to Posonoazole; RESOLVED; continue at lower dose 100 mg/day.   # 2002 [[place]] BREAST CA s/p Lumpect RT; [? Stage I] no chemo s/p AI  # F-ONE HEM: FLT3 ALTERATION NOTED  # JAN 2021- 1/18-Palliative care evaluation.   DIAGNOSIS: ACUTE MYELOID LEUKEMIA GOALS: pallaitive  CURRENT/MOST RECENT THERAPY : Gilteritinib     MDS (myelodysplastic syndrome) (HCC) (Resolved)  08/28/2018 Initial Diagnosis   MDS (myelodysplastic syndrome) (HCC)   Acute myeloid leukemia not having achieved remission (HCC)[place]/15/2020 Initial Diagnosis   AML (acute myeloid leukemia) with failed remission (HCC)[place]6/15/2020 - 12/22/2018 Chemotherapy   The patient had azaCITIDine (VIDAZA) chemo injection 105 mg, 75 mg/m2 = 105 mg, Subcutaneous,  Once, 1 of 4 cycles Administration: 105 mg (11/25/2018), 105 mg (11/26/2018), 105 mg (11/27/2018), 105 mg (11/28/2018), 105 mg (11/29/2018)  for chemotherapy treatment.    01/27/2019 -  Chemotherapy   The patient had palonosetron (ALOXI) injection 0.25 mg, 0.25 mg, Intravenous,  Once, 1 of 4 cycles Administration: 0.25 mg (01/27/2019), 0.25 mg (01/29/2019), 0.25 mg (01/31/2019) azaCITIDine (VIDAZA) 100 mg in sodium chloride 0.9 % 50 mL chemo infusion, 75 mg/m2 = 100 mg, Intravenous, Once, 1 of 4 cycles Administration: 100 mg (  01/27/2019), 100 mg (01/28/2019),  100 mg (01/29/2019), 100 mg (01/30/2019), 100 mg (01/31/2019)  for chemotherapy treatment.     Interval history-patient presents to symptom management to complete lab work and assessment due to persistent thrombocytopenia due to disease. She is currently on Gilteritinib.  She is transfusion dependent requiring weekly platelets and occasionally blood.  She continues to have periodic bleeding from her gums and nose.  She continues to not use a toothbrush but uses swabs and mouthwash to prevent bleeding.  She admits to easy bruising with petechiae.  She feels fatigued often and admits to a dizzy spell yesterday morning where she was forced to sit down.  She also has intermittent shortness of breath.  She denies any additional distressing symptoms.  She was last evaluated by Dr. Yevette Edwards on 07/22/2019 where she received platelets and blood.  Plan was for repeat bone marrow biopsy in March due to poor hematological response with gilteritinib.   ECOG FS:2 - Symptomatic, <50% confined to bed  Review of systems- Review of Systems  Constitutional: Positive for malaise/fatigue. Negative for chills, fever and weight loss.  HENT: Negative for congestion, ear pain and tinnitus.   Eyes: Negative.  Negative for blurred vision and double vision.  Respiratory: Positive for shortness of breath. Negative for cough and sputum production.   Cardiovascular: Negative.  Negative for chest pain, palpitations and leg swelling.  Gastrointestinal: Negative.  Negative for abdominal pain, constipation, diarrhea, nausea and vomiting.  Genitourinary: Negative for dysuria, frequency and urgency.  Musculoskeletal: Negative for back pain and falls.  Skin: Negative.  Negative for rash.  Neurological: Positive for dizziness. Negative for weakness and headaches.  Endo/Heme/Allergies: Bruises/bleeds easily.  Psychiatric/Behavioral: Negative.  Negative for depression. The patient is not nervous/anxious and does not have insomnia.       Current treatment- Gilterritnib. 120 mg daily  Allergies  Allergen Reactions  . Terbinafine Rash and Swelling     Past Medical History:  Diagnosis Date  . Anemia   . Arthritis   . Breast cancer ([place]) 2003   RT LUMPECTOMY  . Edema leg   . History of breast cancer 2003   post lumpectomy  . History of kidney stones   . Hyperlipemia, mixed   . Hypertension, essential   . Leukemia ([place])   . Leukocytosis   . MDS (myelodysplastic syndrome) ([place])   . Personal history of radiation therapy 2003   BREAST CA  . Renal stones   . Vitamin D deficiency      Past Surgical History:  Procedure Laterality Date  . BREAST BIOPSY Right 2003   Postive for cancer  . BREAST LUMPECTOMY Right 2003   BREAST CA  . KIDNEY STONE SURGERY Right     Social History   Socioeconomic History  . Marital status: Married    Spouse name: Not on file  . Number of children: Not on file  . Years of education: Not on file  . Highest education level: Not on file  Occupational History  . Not on file  Tobacco Use  . Smoking status: Never Smoker  . Smokeless tobacco: Never Used  Substance and Sexual Activity  . Alcohol use: Yes    Alcohol/week: 3.0 standard drinks    Types: 3 Glasses of wine per week  . Drug use: No  . Sexual activity: Not on file  Other Topics Concern  . Not on file  Social History Narrative    No smoking/alcohol; lives in [place] with family.  She lives  in assisted living with her husband.   Social Determinants of Health   Financial Resource Strain:   . Difficulty of Paying Living Expenses: Not on file  Food Insecurity:   . Worried About Charity fundraiser in the Last Year: Not on file  . Ran Out of Food in the Last Year: Not on file  Transportation Needs:   . Lack of Transportation (Medical): Not on file  . Lack of Transportation (Non-Medical): Not on file  Physical Activity:   . Days of Exercise per Week: Not on file  . Minutes of Exercise per Session: Not on file   Stress:   . Feeling of Stress : Not on file  Social Connections:   . Frequency of Communication with Friends and Family: Not on file  . Frequency of Social Gatherings with Friends and Family: Not on file  . Attends Religious Services: Not on file  . Active Member of Clubs or Organizations: Not on file  . Attends Archivist Meetings: Not on file  . Marital Status: Not on file  Intimate Partner Violence:   . Fear of Current or Ex-Partner: Not on file  . Emotionally Abused: Not on file  . Physically Abused: Not on file  . Sexually Abused: Not on file    Family History  Problem Relation Age of Onset  . Stroke Mother   . Hypertension Father   . Stroke Father   . Breast cancer Neg Hx      Current Outpatient Medications:  .  acetaminophen (TYLENOL) 325 MG tablet, Take 325 mg by mouth every 6 (six) hours as needed for mild pain or moderate pain. , Disp: , Rfl:  .  acyclovir (ZOVIRAX) 400 MG tablet, ONE PILL A DAY [TO PREVENT SHINGLES], Disp: 90 tablet, Rfl: 1 .  atenolol (TENORMIN) 50 MG tablet, Take 1 tablet by mouth 2 (two) times daily., Disp: , Rfl:  .  calcium carbonate (OSCAL) 1500 (600 Ca) MG TABS tablet, Take 600 mg of elemental calcium by mouth daily with breakfast., Disp: , Rfl:  .  Cholecalciferol (VITAMIN D) 2000 units tablet, Take 1 tablet by mouth daily., Disp: , Rfl:  .  Gilteritinib Fumarate (XOSPATA) 40 MG TABS, Take 120 mg by mouth daily., Disp: 90 tablet, Rfl: 2 .  Infant Care Products (DERMACLOUD) CREA, Apply topically., Disp: , Rfl:  .  KLOR-CON M20 20 MEQ tablet, TAKE 1 TABLET BY MOUTH TWICE A DAY, Disp: 180 tablet, Rfl: 0 .  levofloxacin (LEVAQUIN) 250 MG tablet, Take 1 tablet (250 mg total) by mouth daily., Disp: 30 tablet, Rfl: 3 .  losartan-hydrochlorothiazide (HYZAAR) 50-12.5 MG tablet, Take 1 tablet by mouth daily., Disp: , Rfl:  .  magic mouthwash w/lidocaine SOLN, Take 5 mLs by mouth 4 (four) times daily as needed for mouth pain., Disp: 480 mL,  Rfl: 3 .  magnesium hydroxide (MILK OF MAGNESIA) 400 MG/5ML suspension, Take 5 mLs by mouth every 4 (four) hours as needed., Disp: , Rfl:  .  ondansetron (ZOFRAN) 8 MG tablet, Take by mouth every 8 (eight) hours as needed for nausea or vomiting., Disp: , Rfl:  .  posaconazole (NOXAFIL) 100 MG TBEC delayed-release tablet, Take 1 tablet (100 mg total) by mouth daily., Disp: 30 tablet, Rfl: 3 .  heparin lock flush 100 UNIT/ML SOLN injection, Inject into the vein., Disp: , Rfl:  .  polyethylene glycol (MIRALAX / GLYCOLAX) 17 g packet, Take 17 g by mouth daily., Disp: , Rfl:  .  senna (SENOKOT) 8.6 MG tablet, Take 2 tablets by mouth 2 (two) times daily., Disp: , Rfl:  No current facility-administered medications for this visit.  Facility-Administered Medications Ordered in Other Visits:  .  0.9 %  sodium chloride infusion, , Intravenous, Continuous, Kailiana Granquist, Wandra Feinstein, NP, Stopped at 02/05/19 1630 .  diphenhydrAMINE (BENADRYL) capsule 25 mg, 25 mg, Oral, Once, Brahmanday, Govinda R, MD .  heparin lock flush 100 unit/mL, 250 Units, Intravenous, Once, Cammie Sickle, MD  Physical exam:  Vitals:   07/29/19 0852  BP: (!) 151/96  Pulse: 73  Resp: 18  Temp: 97.7 F (36.5 C)  TempSrc: Tympanic  SpO2: 100%  Weight: 98 lb 9.6 oz (44.7 kg)   Physical Exam Constitutional:      Appearance: Normal appearance.  HENT:     Head: Normocephalic and atraumatic.  Eyes:     Pupils: Pupils are equal, round, and reactive to light.  Cardiovascular:     Rate and Rhythm: Normal rate and regular rhythm.     Heart sounds: Normal heart sounds. No murmur.  Pulmonary:     Effort: Pulmonary effort is normal.     Breath sounds: Normal breath sounds. No wheezing.  Abdominal:     General: Bowel sounds are normal. There is no distension.     Palpations: Abdomen is soft.     Tenderness: There is no abdominal tenderness.  Musculoskeletal:        General: Normal range of motion.     Cervical back: Normal  range of motion.  Skin:    General: Skin is warm and dry.     Coloration: Skin is pale.     Findings: Petechiae present. No rash.  Neurological:     Mental Status: She is alert and oriented to person, place, and time.  Psychiatric:        Judgment: Judgment normal.      CMP Latest Ref Rng & Units 07/29/2019  Glucose 70 - 99 mg/dL 184(H)  BUN 8 - 23 mg/dL 33(H)  Creatinine 0.44 - 1.00 mg/dL 1.00  Sodium 135 - 145 mmol/L 137  Potassium 3.5 - 5.1 mmol/L 4.0  Chloride 98 - 111 mmol/L 100  CO2 22 - 32 mmol/L 24  Calcium 8.9 - 10.3 mg/dL 8.8(L)  Total Protein 6.5 - 8.1 g/dL 5.8(L)  Total Bilirubin 0.3 - 1.2 mg/dL 0.3  Alkaline Phos 38 - 126 U/L 92  AST 15 - 41 U/L 103(H)  ALT 0 - 44 U/L 48(H)   CBC Latest Ref Rng & Units 07/29/2019  WBC 4.0 - 10.5 K/uL 5.9  Hemoglobin 12.0 - 15.0 g/dL 8.7(L)  Hematocrit 36.0 - 46.0 % 27.8(L)  Platelets 150 - 400 K/uL 11(LL)    No images are attached to the encounter.  No results found.   Assessment and plan- Patient is a 86 y.o. female who presents for lab work and possible platelet/blood transfusion.  Platelet count 11,000.  Hemoglobin 8.7.  AML: Recurrent/relapsed AML with recent diagnosis in June 2021 status post chemotherapy and now on treatment with gilteritinib.  Transfusion dependent.  Requires platelet transfusion often.  Plan is for repeat bone marrow next month.  Bleeding/gums/nose: Monitor.  Continue to use swabs and mouthwash to prevent increased bleeding.  Supportive care with platelet transfusion.  See below.  Plan: Labs: Platelet count 11,000. Transfuse 2 units platelets today.  Orders are placed. She will not require a blood transfusion today.  Hemoglobin 8.7. Continue gilteritinib as prescribed.  Disposition: RTC on 08/01/2019  for labs, possible blood and possible platelet transfusion.   RTC on 08/19/2019 for palliative care follow-up.  Visit Diagnosis 1. Acute myeloid leukemia not having achieved remission ([place])   2.  Thrombocytopenia ([place])     Patient expressed understanding and was in agreement with this plan. She also understands that She can call clinic at any time with any questions, concerns, or complaints.   Greater than 50% was spent in counseling and coordination of care with this patient including but not limited to discussion of the relevant topics above (See A&P) including, but not limited to diagnosis and management of acute and chronic medical conditions.   Thank you for allowing me to participate in the care of this very pleasant patient.    Jacquelin Hawking, NP [place] [facility] Cell - [phone] Pager- [phone] 07/29/2019 3:34 PM

## 2019-07-29 NOTE — Progress Notes (Signed)
Mrs. Hurwitz mentioned that she does not want to take Benadryl anymore prior to getting blood products because it makes her feel to tired. Patient was educated on why Benadryl is ordered but still refused to take the medication. Dr. Rogue Bussing is aware. She is still open to continue to take Tylenol as a pre med.

## 2019-07-30 LAB — PREPARE PLATELET PHERESIS
Unit division: 0
Unit division: 0

## 2019-07-30 LAB — BPAM PLATELET PHERESIS
Blood Product Expiration Date: 202102182359
Blood Product Expiration Date: 202102192359
ISSUE DATE / TIME: 202102161202
ISSUE DATE / TIME: 202102161302
Unit Type and Rh: 6200
Unit Type and Rh: 6200

## 2019-07-31 MED FILL — XOSPATA 40 MG TABS: 40 | 30 days supply | Qty: 90 | Fill #1

## 2019-08-01 ENCOUNTER — Other Ambulatory Visit: Payer: Self-pay

## 2019-08-01 ENCOUNTER — Other Ambulatory Visit: Payer: Self-pay | Admitting: *Deleted

## 2019-08-01 ENCOUNTER — Inpatient Hospital Stay: Payer: Medicare Other | Admitting: *Deleted

## 2019-08-01 ENCOUNTER — Inpatient Hospital Stay: Payer: Medicare Other

## 2019-08-01 DIAGNOSIS — C9202 Acute myeloblastic leukemia, in relapse: Secondary | ICD-10-CM | POA: Diagnosis not present

## 2019-08-01 DIAGNOSIS — D696 Thrombocytopenia, unspecified: Secondary | ICD-10-CM

## 2019-08-01 DIAGNOSIS — C9200 Acute myeloblastic leukemia, not having achieved remission: Secondary | ICD-10-CM

## 2019-08-01 DIAGNOSIS — Z452 Encounter for adjustment and management of vascular access device: Secondary | ICD-10-CM

## 2019-08-01 LAB — CBC WITH DIFFERENTIAL/PLATELET
Abs Immature Granulocytes: 0 10*3/uL (ref 0.00–0.07)
Basophils Absolute: 0.1 10*3/uL (ref 0.0–0.1)
Basophils Relative: 1 %
Blasts: 14 %
Eosinophils Absolute: 0 10*3/uL (ref 0.0–0.5)
Eosinophils Relative: 0 %
HCT: 27.5 % — ABNORMAL LOW (ref 36.0–46.0)
Hemoglobin: 8.5 g/dL — ABNORMAL LOW (ref 12.0–15.0)
Lymphocytes Relative: 26 %
Lymphs Abs: 2.3 10*3/uL (ref 0.7–4.0)
MCH: 27.2 pg (ref 26.0–34.0)
MCHC: 30.9 g/dL (ref 30.0–36.0)
MCV: 88.1 fL (ref 80.0–100.0)
Monocytes Absolute: 3.7 10*3/uL — ABNORMAL HIGH (ref 0.1–1.0)
Monocytes Relative: 42 %
Neutro Abs: 1.5 10*3/uL — ABNORMAL LOW (ref 1.7–7.7)
Neutrophils Relative %: 17 %
Platelets: 20 10*3/uL — CL (ref 150–400)
RBC: 3.12 MIL/uL — ABNORMAL LOW (ref 3.87–5.11)
RDW: 17.8 % — ABNORMAL HIGH (ref 11.5–15.5)
WBC: 8.7 10*3/uL (ref 4.0–10.5)
nRBC: 2.1 % — ABNORMAL HIGH (ref 0.0–0.2)

## 2019-08-01 LAB — SAMPLE TO BLOOD BANK

## 2019-08-01 MED ORDER — SODIUM CHLORIDE 0.9% FLUSH
10.0000 mL | Freq: Once | INTRAVENOUS | Status: AC
  Administered 2019-08-01: 10:00:00 10 mL via INTRAVENOUS
  Filled 2019-08-01: qty 10

## 2019-08-01 NOTE — Progress Notes (Signed)
Per Dr. Rogue Bussing no platelets or PRBC's needed today. PICC line dressing changed and patient was sent home.

## 2019-08-04 ENCOUNTER — Other Ambulatory Visit: Payer: Self-pay

## 2019-08-04 ENCOUNTER — Other Ambulatory Visit: Payer: Self-pay | Admitting: *Deleted

## 2019-08-04 DIAGNOSIS — C9200 Acute myeloblastic leukemia, not having achieved remission: Secondary | ICD-10-CM

## 2019-08-05 ENCOUNTER — Other Ambulatory Visit: Payer: Self-pay

## 2019-08-05 ENCOUNTER — Other Ambulatory Visit: Payer: Self-pay | Admitting: *Deleted

## 2019-08-05 ENCOUNTER — Inpatient Hospital Stay: Payer: Medicare Other

## 2019-08-05 ENCOUNTER — Inpatient Hospital Stay: Payer: Medicare Other | Admitting: *Deleted

## 2019-08-05 DIAGNOSIS — D696 Thrombocytopenia, unspecified: Secondary | ICD-10-CM

## 2019-08-05 DIAGNOSIS — C9200 Acute myeloblastic leukemia, not having achieved remission: Secondary | ICD-10-CM

## 2019-08-05 DIAGNOSIS — Z95828 Presence of other vascular implants and grafts: Secondary | ICD-10-CM

## 2019-08-05 LAB — CBC WITH DIFFERENTIAL/PLATELET
Abs Immature Granulocytes: 0 10*3/uL (ref 0.00–0.07)
Basophils Absolute: 0 10*3/uL (ref 0.0–0.1)
Basophils Relative: 0 %
Blasts: 31 %
Eosinophils Absolute: 0 10*3/uL (ref 0.0–0.5)
Eosinophils Relative: 0 %
HCT: 24.7 % — ABNORMAL LOW (ref 36.0–46.0)
Hemoglobin: 7.5 g/dL — ABNORMAL LOW (ref 12.0–15.0)
Lymphocytes Relative: 16 %
Lymphs Abs: 3 10*3/uL (ref 0.7–4.0)
MCH: 27 pg (ref 26.0–34.0)
MCHC: 30.4 g/dL (ref 30.0–36.0)
MCV: 88.8 fL (ref 80.0–100.0)
Monocytes Absolute: 7.6 10*3/uL — ABNORMAL HIGH (ref 0.1–1.0)
Monocytes Relative: 41 %
Neutro Abs: 2.2 10*3/uL (ref 1.7–7.7)
Neutrophils Relative %: 12 %
Platelets: 17 10*3/uL — CL (ref 150–400)
RBC: 2.78 MIL/uL — ABNORMAL LOW (ref 3.87–5.11)
RDW: 17.7 % — ABNORMAL HIGH (ref 11.5–15.5)
Smear Review: DECREASED
WBC: 18.6 10*3/uL — ABNORMAL HIGH (ref 4.0–10.5)
nRBC: 3.4 % — ABNORMAL HIGH (ref 0.0–0.2)

## 2019-08-05 LAB — COMPREHENSIVE METABOLIC PANEL
ALT: 45 U/L — ABNORMAL HIGH (ref 0–44)
AST: 168 U/L — ABNORMAL HIGH (ref 15–41)
Albumin: 3.1 g/dL — ABNORMAL LOW (ref 3.5–5.0)
Alkaline Phosphatase: 96 U/L (ref 38–126)
Anion gap: 12 (ref 5–15)
BUN: 47 mg/dL — ABNORMAL HIGH (ref 8–23)
CO2: 24 mmol/L (ref 22–32)
Calcium: 9.1 mg/dL (ref 8.9–10.3)
Chloride: 102 mmol/L (ref 98–111)
Creatinine, Ser: 1.21 mg/dL — ABNORMAL HIGH (ref 0.44–1.00)
GFR calc Af Amer: 48 mL/min — ABNORMAL LOW (ref >60)
GFR calc non Af Amer: 41 mL/min — ABNORMAL LOW (ref >60)
Glucose, Bld: 180 mg/dL — ABNORMAL HIGH (ref 70–99)
Potassium: 3.8 mmol/L (ref 3.5–5.1)
Sodium: 138 mmol/L (ref 135–145)
Total Bilirubin: 0.5 mg/dL (ref 0.3–1.2)
Total Protein: 5.6 g/dL — ABNORMAL LOW (ref 6.5–8.1)

## 2019-08-05 LAB — SAMPLE TO BLOOD BANK

## 2019-08-05 LAB — LACTATE DEHYDROGENASE: LDH: 4038 U/L — ABNORMAL HIGH (ref 98–192)

## 2019-08-05 LAB — PREPARE RBC (CROSSMATCH)

## 2019-08-05 MED ORDER — SODIUM CHLORIDE 0.9% FLUSH
10.0000 mL | Freq: Once | INTRAVENOUS | Status: AC
  Administered 2019-08-05: 10 mL via INTRAVENOUS
  Filled 2019-08-05: qty 10

## 2019-08-05 MED ORDER — ACETAMINOPHEN 325 MG PO TABS
650.0000 mg | ORAL_TABLET | Freq: Once | ORAL | Status: AC
  Administered 2019-08-05: 650 mg via ORAL
  Filled 2019-08-05: qty 2

## 2019-08-05 MED ORDER — DIPHENHYDRAMINE HCL 25 MG PO CAPS: 25.0000 mg | ORAL_CAPSULE | Freq: Once | ORAL | Status: DC

## 2019-08-05 MED ORDER — HEPARIN SOD (PORK) LOCK FLUSH 100 UNIT/ML IV SOLN
500.0000 [IU] | Freq: Once | INTRAVENOUS | Status: AC
  Administered 2019-08-05: 250 [IU] via INTRAVENOUS
  Filled 2019-08-05: qty 5

## 2019-08-05 MED ORDER — SODIUM CHLORIDE 0.9% IV SOLUTION
250.0000 mL | Freq: Once | INTRAVENOUS | Status: AC
  Administered 2019-08-05: 250 mL via INTRAVENOUS
  Filled 2019-08-05: qty 250

## 2019-08-05 NOTE — Progress Notes (Signed)
[id number]: Per [name] RN per Dr, Rogue Bussing pt to receive one unit blood and one unit platelets.

## 2019-08-06 LAB — PREPARE PLATELET PHERESIS: Unit division: 0

## 2019-08-06 LAB — TYPE AND SCREEN
ABO/RH(D): O POS
Antibody Screen: NEGATIVE
Unit division: 0

## 2019-08-06 LAB — BPAM PLATELET PHERESIS
Blood Product Expiration Date: 202102252359
ISSUE DATE / TIME: 202102231240
Unit Type and Rh: 5100

## 2019-08-06 LAB — BPAM RBC
Blood Product Expiration Date: 202103022359
ISSUE DATE / TIME: 202102231017
Unit Type and Rh: 9500

## 2019-08-08 ENCOUNTER — Encounter: Payer: Self-pay | Admitting: Internal Medicine

## 2019-08-08 ENCOUNTER — Inpatient Hospital Stay
Admission: AD | Admit: 2019-08-08 | Discharge: 2019-08-12 | DRG: 682 | Disposition: A | Discharge status: Skilled Nursing Facility | Payer: Medicare Other | Source: Ambulatory Visit | Attending: Internal Medicine | Admitting: Internal Medicine

## 2019-08-08 ENCOUNTER — Other Ambulatory Visit: Payer: Self-pay

## 2019-08-08 ENCOUNTER — Inpatient Hospital Stay: Payer: Medicare Other

## 2019-08-08 ENCOUNTER — Other Ambulatory Visit: Payer: Self-pay | Admitting: *Deleted

## 2019-08-08 ENCOUNTER — Inpatient Hospital Stay (HOSPITAL_BASED_OUTPATIENT_CLINIC_OR_DEPARTMENT_OTHER): Payer: Medicare Other | Admitting: Internal Medicine

## 2019-08-08 DIAGNOSIS — C9200 Acute myeloblastic leukemia, not having achieved remission: Secondary | ICD-10-CM

## 2019-08-08 DIAGNOSIS — Z8249 Family history of ischemic heart disease and other diseases of the circulatory system: Secondary | ICD-10-CM

## 2019-08-08 DIAGNOSIS — Z79899 Other long term (current) drug therapy: Secondary | ICD-10-CM

## 2019-08-08 DIAGNOSIS — M1990 Unspecified osteoarthritis, unspecified site: Secondary | ICD-10-CM | POA: Diagnosis present

## 2019-08-08 DIAGNOSIS — Z20822 Contact with and (suspected) exposure to covid-19: Secondary | ICD-10-CM | POA: Diagnosis present

## 2019-08-08 DIAGNOSIS — N139 Obstructive and reflux uropathy, unspecified: Secondary | ICD-10-CM | POA: Diagnosis present

## 2019-08-08 DIAGNOSIS — G9341 Metabolic encephalopathy: Secondary | ICD-10-CM | POA: Diagnosis present

## 2019-08-08 DIAGNOSIS — E559 Vitamin D deficiency, unspecified: Secondary | ICD-10-CM | POA: Diagnosis present

## 2019-08-08 DIAGNOSIS — H9190 Unspecified hearing loss, unspecified ear: Secondary | ICD-10-CM | POA: Diagnosis present

## 2019-08-08 DIAGNOSIS — Z515 Encounter for palliative care: Secondary | ICD-10-CM | POA: Diagnosis present

## 2019-08-08 DIAGNOSIS — E790 Hyperuricemia without signs of inflammatory arthritis and tophaceous disease: Secondary | ICD-10-CM | POA: Diagnosis not present

## 2019-08-08 DIAGNOSIS — K219 Gastro-esophageal reflux disease without esophagitis: Secondary | ICD-10-CM | POA: Diagnosis present

## 2019-08-08 DIAGNOSIS — Z923 Personal history of irradiation: Secondary | ICD-10-CM | POA: Diagnosis not present

## 2019-08-08 DIAGNOSIS — R64 Cachexia: Secondary | ICD-10-CM | POA: Diagnosis present

## 2019-08-08 DIAGNOSIS — D469 Myelodysplastic syndrome, unspecified: Secondary | ICD-10-CM

## 2019-08-08 DIAGNOSIS — C9202 Acute myeloblastic leukemia, in relapse: Secondary | ICD-10-CM | POA: Diagnosis present

## 2019-08-08 DIAGNOSIS — D65 Disseminated intravascular coagulation [defibrination syndrome]: Secondary | ICD-10-CM | POA: Diagnosis present

## 2019-08-08 DIAGNOSIS — N170 Acute kidney failure with tubular necrosis: Secondary | ICD-10-CM | POA: Diagnosis present

## 2019-08-08 DIAGNOSIS — C9502 Acute leukemia of unspecified cell type, in relapse: Secondary | ICD-10-CM

## 2019-08-08 DIAGNOSIS — Z888 Allergy status to other drugs, medicaments and biological substances status: Secondary | ICD-10-CM

## 2019-08-08 DIAGNOSIS — R945 Abnormal results of liver function studies: Secondary | ICD-10-CM

## 2019-08-08 DIAGNOSIS — D696 Thrombocytopenia, unspecified: Secondary | ICD-10-CM | POA: Diagnosis present

## 2019-08-08 DIAGNOSIS — N179 Acute kidney failure, unspecified: Secondary | ICD-10-CM | POA: Diagnosis present

## 2019-08-08 DIAGNOSIS — R7989 Other specified abnormal findings of blood chemistry: Secondary | ICD-10-CM | POA: Diagnosis present

## 2019-08-08 DIAGNOSIS — E782 Mixed hyperlipidemia: Secondary | ICD-10-CM | POA: Diagnosis present

## 2019-08-08 DIAGNOSIS — Z6820 Body mass index (BMI) 20.0-20.9, adult: Secondary | ICD-10-CM | POA: Diagnosis not present

## 2019-08-08 DIAGNOSIS — E883 Tumor lysis syndrome: Secondary | ICD-10-CM | POA: Diagnosis present

## 2019-08-08 DIAGNOSIS — I10 Essential (primary) hypertension: Secondary | ICD-10-CM | POA: Diagnosis present

## 2019-08-08 DIAGNOSIS — I959 Hypotension, unspecified: Secondary | ICD-10-CM | POA: Diagnosis present

## 2019-08-08 DIAGNOSIS — Z9221 Personal history of antineoplastic chemotherapy: Secondary | ICD-10-CM

## 2019-08-08 DIAGNOSIS — Z853 Personal history of malignant neoplasm of breast: Secondary | ICD-10-CM | POA: Diagnosis not present

## 2019-08-08 DIAGNOSIS — Z66 Do not resuscitate: Secondary | ICD-10-CM | POA: Diagnosis present

## 2019-08-08 DIAGNOSIS — E860 Dehydration: Secondary | ICD-10-CM | POA: Diagnosis present

## 2019-08-08 DIAGNOSIS — Z87442 Personal history of urinary calculi: Secondary | ICD-10-CM | POA: Diagnosis not present

## 2019-08-08 LAB — COMPREHENSIVE METABOLIC PANEL
ALT: 51 U/L — ABNORMAL HIGH (ref 0–44)
AST: 303 U/L — ABNORMAL HIGH (ref 15–41)
Albumin: 3.1 g/dL — ABNORMAL LOW (ref 3.5–5.0)
Alkaline Phosphatase: 118 U/L (ref 38–126)
Anion gap: 12 (ref 5–15)
BUN: 56 mg/dL — ABNORMAL HIGH (ref 8–23)
CO2: 23 mmol/L (ref 22–32)
Calcium: 8.9 mg/dL (ref 8.9–10.3)
Chloride: 103 mmol/L (ref 98–111)
Creatinine, Ser: 1.48 mg/dL — ABNORMAL HIGH (ref 0.44–1.00)
GFR calc Af Amer: 38 mL/min — ABNORMAL LOW (ref >60)
GFR calc non Af Amer: 32 mL/min — ABNORMAL LOW (ref >60)
Glucose, Bld: 136 mg/dL — ABNORMAL HIGH (ref 70–99)
Potassium: 3.9 mmol/L (ref 3.5–5.1)
Sodium: 138 mmol/L (ref 135–145)
Total Bilirubin: 0.4 mg/dL (ref 0.3–1.2)
Total Protein: 5.6 g/dL — ABNORMAL LOW (ref 6.5–8.1)

## 2019-08-08 LAB — CBC WITH DIFFERENTIAL/PLATELET
Abs Immature Granulocytes: 2.6 10*3/uL — ABNORMAL HIGH (ref 0.00–0.07)
Basophils Absolute: 0 10*3/uL (ref 0.0–0.1)
Basophils Relative: 0 %
Blasts: 17 %
Eosinophils Absolute: 0 10*3/uL (ref 0.0–0.5)
Eosinophils Relative: 0 %
HCT: 26 % — ABNORMAL LOW (ref 36.0–46.0)
Hemoglobin: 8 g/dL — ABNORMAL LOW (ref 12.0–15.0)
Lymphocytes Relative: 22 %
Lymphs Abs: 8.2 10*3/uL — ABNORMAL HIGH (ref 0.7–4.0)
MCH: 26.8 pg (ref 26.0–34.0)
MCHC: 30.8 g/dL (ref 30.0–36.0)
MCV: 87.2 fL (ref 80.0–100.0)
Metamyelocytes Relative: 2 %
Monocytes Absolute: 13.8 10*3/uL — ABNORMAL HIGH (ref 0.1–1.0)
Monocytes Relative: 37 %
Myelocytes: 4 %
Neutro Abs: 6.3 10*3/uL (ref 1.7–7.7)
Neutrophils Relative %: 17 %
Platelets: 32 10*3/uL — ABNORMAL LOW (ref 150–400)
Promyelocytes Relative: 1 %
RBC: 2.98 MIL/uL — ABNORMAL LOW (ref 3.87–5.11)
RDW: 17.4 % — ABNORMAL HIGH (ref 11.5–15.5)
Smear Review: DECREASED
WBC: 37.2 10*3/uL — ABNORMAL HIGH (ref 4.0–10.5)
nRBC: 3.5 % — ABNORMAL HIGH (ref 0.0–0.2)

## 2019-08-08 LAB — SAMPLE TO BLOOD BANK

## 2019-08-08 LAB — AMYLASE: Amylase: 135 U/L — ABNORMAL HIGH (ref 28–100)

## 2019-08-08 LAB — LACTATE DEHYDROGENASE: LDH: 6657 U/L — ABNORMAL HIGH (ref 98–192)

## 2019-08-08 LAB — APTT: aPTT: 44 seconds — ABNORMAL HIGH (ref 24–36)

## 2019-08-08 LAB — PROTIME-INR
INR: 1.7 — ABNORMAL HIGH (ref 0.8–1.2)
Prothrombin Time: 19.8 seconds — ABNORMAL HIGH (ref 11.4–15.2)

## 2019-08-08 LAB — LIPASE, BLOOD: Lipase: 92 U/L — ABNORMAL HIGH (ref 11–51)

## 2019-08-08 LAB — SARS CORONAVIRUS 2 (TAT 6-24 HRS): SARS Coronavirus 2: NEGATIVE

## 2019-08-08 MED ORDER — SODIUM CHLORIDE 0.9 % IV SOLN: INTRAVENOUS | Status: DC

## 2019-08-08 MED ORDER — MAGNESIUM HYDROXIDE 400 MG/5ML PO SUSP: 5.0000 mL | ORAL | Status: DC | PRN

## 2019-08-08 MED ORDER — PANTOPRAZOLE SODIUM 40 MG PO TBEC
40.0000 mg | DELAYED_RELEASE_TABLET | Freq: Every day | ORAL | Status: DC
  Administered 2019-08-09: 40 mg via ORAL
  Filled 2019-08-08 (×2): qty 1

## 2019-08-08 MED ORDER — HYDRALAZINE HCL 25 MG PO TABS
25.0000 mg | ORAL_TABLET | Freq: Three times a day (TID) | ORAL | Status: DC | PRN
  Filled 2019-08-08: qty 1

## 2019-08-08 MED ORDER — ONDANSETRON HCL 4 MG/2ML IJ SOLN
8.0000 mg | Freq: Once | INTRAMUSCULAR | Status: AC
  Administered 2019-08-08: 10:00:00 8 mg via INTRAVENOUS
  Filled 2019-08-08: qty 4

## 2019-08-08 MED ORDER — SODIUM CHLORIDE 0.9% FLUSH
10.0000 mL | INTRAVENOUS | Status: DC | PRN
  Administered 2019-08-08: 08:00:00 10 mL via INTRAVENOUS
  Filled 2019-08-08: qty 10

## 2019-08-08 MED ORDER — ALBUTEROL SULFATE (2.5 MG/3ML) 0.083% IN NEBU: 2.5000 mg | INHALATION_SOLUTION | RESPIRATORY_TRACT | Status: DC | PRN

## 2019-08-08 MED ORDER — ONDANSETRON HCL 4 MG/2ML IJ SOLN
4.0000 mg | Freq: Three times a day (TID) | INTRAMUSCULAR | Status: DC | PRN
  Administered 2019-08-09 – 2019-08-12 (×5): 4 mg via INTRAVENOUS
  Filled 2019-08-08 (×5): qty 2

## 2019-08-08 MED ORDER — DM-GUAIFENESIN ER 30-600 MG PO TB12
1.0000 | ORAL_TABLET | Freq: Two times a day (BID) | ORAL | Status: DC
  Administered 2019-08-08 – 2019-08-09 (×3): 1 via ORAL
  Filled 2019-08-08 (×4): qty 1

## 2019-08-08 MED ORDER — CALCIUM CARBONATE 1250 (500 CA) MG PO TABS
500.0000 mg | ORAL_TABLET | Freq: Every day | ORAL | Status: DC
  Administered 2019-08-09: 500 mg via ORAL
  Filled 2019-08-08 (×2): qty 1

## 2019-08-08 MED ORDER — DEXAMETHASONE SODIUM PHOSPHATE 10 MG/ML IJ SOLN: 10.0000 mg | Freq: Once | INTRAMUSCULAR | Status: DC

## 2019-08-08 MED ORDER — CHLORHEXIDINE GLUCONATE CLOTH 2 % EX PADS
6.0000 | MEDICATED_PAD | Freq: Every day | CUTANEOUS | Status: DC
  Administered 2019-08-09 – 2019-08-11 (×3): 6 via TOPICAL

## 2019-08-08 MED ORDER — VITAMIN D3 25 MCG (1000 UNIT) PO TABS
2000.0000 [IU] | ORAL_TABLET | Freq: Every day | ORAL | Status: DC
  Administered 2019-08-09: 2000 [IU] via ORAL
  Filled 2019-08-08 (×4): qty 2

## 2019-08-08 MED ORDER — SENNA 8.6 MG PO TABS
2.0000 | ORAL_TABLET | Freq: Two times a day (BID) | ORAL | Status: DC
  Administered 2019-08-08 – 2019-08-09 (×2): 17.2 mg via ORAL
  Filled 2019-08-08 (×2): qty 2

## 2019-08-08 MED ORDER — DEXAMETHASONE SODIUM PHOSPHATE 10 MG/ML IJ SOLN
10.0000 mg | Freq: Once | INTRAMUSCULAR | Status: AC
  Administered 2019-08-08: 10 mg via INTRAVENOUS
  Filled 2019-08-08: qty 1

## 2019-08-08 MED ORDER — MAGIC MOUTHWASH W/LIDOCAINE
5.0000 mL | Freq: Four times a day (QID) | ORAL | Status: DC | PRN
  Filled 2019-08-08: qty 5

## 2019-08-08 MED ORDER — CALCIUM CARBONATE 1500 (600 CA) MG PO TABS
600.0000 mg | ORAL_TABLET | Freq: Every day | ORAL | Status: DC
  Filled 2019-08-08: qty 1

## 2019-08-08 MED ORDER — SODIUM CHLORIDE 0.9 % IV SOLN
INTRAVENOUS | Status: DC
  Filled 2019-08-08 (×2): qty 250

## 2019-08-08 MED ORDER — DEXAMETHASONE SODIUM PHOSPHATE 4 MG/ML IJ SOLN
10.0000 mg | Freq: Two times a day (BID) | INTRAMUSCULAR | Status: DC
  Administered 2019-08-08 – 2019-08-09 (×2): 10 mg via INTRAVENOUS
  Filled 2019-08-08 (×3): qty 2.5

## 2019-08-08 MED ORDER — ATENOLOL 25 MG PO TABS
50.0000 mg | ORAL_TABLET | Freq: Two times a day (BID) | ORAL | Status: DC
  Administered 2019-08-08 – 2019-08-09 (×2): 50 mg via ORAL
  Filled 2019-08-08 (×3): qty 2

## 2019-08-08 MED ORDER — SODIUM CHLORIDE 0.9 % IV SOLN: Freq: Once | INTRAVENOUS | Status: DC

## 2019-08-08 NOTE — Progress Notes (Signed)
Patient states that she is feeling weaker and more short of breath for the past several days. She also states that she has been experiencing acid reflux type symptoms and increased bleeding from her gums.

## 2019-08-08 NOTE — Progress Notes (Signed)
[facility] OFFICE PROGRESS NOTE  Patient Care Team: Rusty Aus, MD as PCP - General (Internal Medicine)  CHIEF COMPLAINTS/PURPOSE OF CONSULTATION: Acute myeloid leukemia   Oncology History Overview Note  # SEP 2017- MYELOPROLIFERATIVE NEOPLASM- [WBC- 24; normal Hb/platelets] hypercellular bone marrow 90-95% proliferation of myeloid cells in various stages of maturation; relative erythroid hypoplasia and proliferation of atypical megakaryocytes; no increase in blasts; peripheral blood Bcr-Abl-NEG; Cytogenetics- WNL. NEG- Jak-2/MPL/CALR [place] limited- mild splenomegaly [~10cm; 463cm3]; OCT 2017- second opinion at [facility]. Surveillance.   # With progressive leukocytosis we evaluated her a month ago and sent BM for exam. She has a progressive leukocytosis so hydrea 576m daily was started on 10/20/2016 and increased to 2gm daily then back down to one daily, then 5 days per week over time due to counts drop. Then we held her hydrea since 04/09/2018 due to continued declining Hb and Plt.s  BMB 06/24/18 showed markedly hypercellular BM 95% with myeloid hyperplasia and atypical megakaryocytic hyperplasia. No significant increase in blasts. 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 BCR / ABL negative, FISH normal. Flow Showed 2%CD34-positive myeloid blasts. Myeloid precursors with low side scatter. Pathogenic variants were detected in the ASXL1, CCND2, CUX1, and U2AF1 genes.  # March 2020- wbc- 14/ Hb 9.5/ platelets- 54.   ---------------------------------------------------   # June 8th 2020- Acute myeloid leukemia [peripheral blood flow cytometry;NGS- pending]- June 15th Vidaza [SQ 1-5 + Venatoclax- #1in pt]; tumor lysis prophylaxis  # Day #28 bone marrow biopsy-  7/14- BMx- NO BLASTS; positive for dysplastic changes. OFF Venatoclax [since 7/17] sec to cytopenias.  #817 cycle #2 Vidaza+ venetoclax 100 mg; stop venetoclax on  8/24 [severe pancytopenia]   # 02/18/2019-start gilteritinib 80 mg a day; HELD on 9/14 [sec to severe pan]  # NOV 9th/2020-BMBx- RELAPSED AML; NOV 11th- STARTED GILTERITINIB 424mday; NOV17th 2020- PROMACTA 5067may; NOV 30th- inrease Gilt to 80 mg/day; DEC 31st-STOP Gilt/ Promatca [no reponse; JAN 5th BMBx- partial response; JAN 8th 2021-PROCEED with GILTERTINIB 120m6my.    # AUG 2020- AST elevation- 248-?sec to Posonoazole; RESOLVED; continue at lower dose 100 mg/day.   # 2002 [[place]] BREAST CA s/p Lumpect RT; [? Stage I] no chemo s/p AI  # F-ONE HEM: FLT3 ALTERATION NOTED  # JAN 2021- 1/18-Palliative care evaluation.   DIAGNOSIS: ACUTE MYELOID LEUKEMIA GOALS: pallaitive  CURRENT/MOST RECENT THERAPY : Gilteritinib     MDS (myelodysplastic syndrome) (HCC) (Resolved)  08/28/2018 Initial Diagnosis   MDS (myelodysplastic syndrome) (HCC)   Acute myeloid leukemia not having achieved remission (HCC)[place]/15/2020 Initial Diagnosis   AML (acute myeloid leukemia) with failed remission (HCC)[place]6/15/2020 - 12/22/2018 Chemotherapy   The patient had azaCITIDine (VIDAZA) chemo injection 105 mg, 75 mg/m2 = 105 mg, Subcutaneous,  Once, 1 of 4 cycles Administration: 105 mg (11/25/2018), 105 mg (11/26/2018), 105 mg (11/27/2018), 105 mg (11/28/2018), 105 mg (11/29/2018)  for chemotherapy treatment.    01/27/2019 -  Chemotherapy   The patient had palonosetron (ALOXI) injection 0.25 mg, 0.25 mg, Intravenous,  Once, 1 of 4 cycles Administration: 0.25 mg (01/27/2019), 0.25 mg (01/29/2019), 0.25 mg (01/31/2019) azaCITIDine (VIDAZA) 100 mg in sodium chloride 0.9 % 50 mL chemo infusion, 75 mg/m2 = 100 mg, Intravenous, Once, 1 of 4 cycles Administration: 100 mg (01/27/2019), 100 mg (01/28/2019), 100 mg (01/29/2019), 100 mg (01/30/2019), 100 mg (01/31/2019)  for chemotherapy treatment.      HISTORY OF PRESENTING ILLNESS:  Kathryn Hahn 85 y83.  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  Patient complains of feeling poorly.  Poor p.o. intake.  Complains of nausea but episodes of bilious vomiting.  No abdominal pain pain no fevers.  Complains of easy bruising.  Complains of gum bleeding.    Complains of shortness of breath on exertion.  Mild swelling in the legs.  No significant weight gain.  Review of Systems  Constitutional: Positive for malaise/fatigue. Negative for chills, diaphoresis, fever and weight loss.  HENT: Negative for nosebleeds and sore throat.   Eyes: Negative for double vision.  Respiratory: Negative for cough, hemoptysis, sputum production and wheezing.   Cardiovascular: Negative for chest pain, palpitations and orthopnea.  Gastrointestinal: Negative for abdominal pain, blood in stool, constipation, diarrhea, heartburn, melena, nausea and vomiting.  Genitourinary: Negative for dysuria, frequency and urgency.  Musculoskeletal: Positive for back pain. Negative for joint pain.  Skin: Negative.  Negative for itching and rash.  Neurological: Negative for dizziness, tingling, focal weakness, weakness and headaches.  Endo/Heme/Allergies: Bruises/bleeds easily.  Psychiatric/Behavioral: Negative for depression. The patient is not nervous/anxious and does not have insomnia.     MEDICAL HISTORY:  Past Medical History:  Diagnosis Date  . Anemia   . Arthritis   . Breast cancer ([place]) 2003   RT LUMPECTOMY  . Edema leg   . History of breast cancer 2003   post lumpectomy  . History of kidney stones   . Hyperlipemia, mixed   . Hypertension, essential   . Leukemia ([place])   . Leukocytosis   . MDS (myelodysplastic syndrome) ([place])   . Personal history of radiation therapy 2003   BREAST CA  . Renal stones   . Vitamin D deficiency     SURGICAL HISTORY: Past Surgical History:  Procedure Laterality Date  . BREAST BIOPSY Right 2003   Postive for cancer  . BREAST LUMPECTOMY  Right 2003   BREAST CA  . KIDNEY STONE SURGERY Right     SOCIAL HISTORY: Social History   Socioeconomic History  . Marital status: Married    Spouse name: Not on file  . Number of children: Not on file  . Years of education: Not on file  . Highest education level: Not on file  Occupational History  . Not on file  Tobacco Use  . Smoking status: Never Smoker  . Smokeless tobacco: Never Used  Substance and Sexual Activity  . Alcohol use: Yes    Alcohol/week: 3.0 standard drinks    Types: 3 Glasses of wine per week  . Drug use: No  . Sexual activity: Not on file  Other Topics Concern  . Not on file  Social History Narrative    No smoking/alcohol; lives in [place] with family.  She lives in assisted living with her husband.   Social Determinants of Health   Financial Resource Strain:   . Difficulty of Paying Living Expenses: Not on file  Food Insecurity:   . Worried About Charity fundraiser in the Last Year: Not on file  . Ran Out of Food in the Last Year: Not on file  Transportation Needs:   . Lack of Transportation (Medical): Not on file  . Lack of Transportation (Non-Medical): Not on file  Physical Activity:   . Days of Exercise per Week: Not on file  . Minutes of Exercise per Session: Not on file  Stress:   . Feeling of Stress : Not on file  Social Connections:   .  Frequency of Communication with Friends and Family: Not on file  . Frequency of Social Gatherings with Friends and Family: Not on file  . Attends Religious Services: Not on file  . Active Member of Clubs or Organizations: Not on file  . Attends Archivist Meetings: Not on file  . Marital Status: Not on file  Intimate Partner Violence:   . Fear of Current or Ex-Partner: Not on file  . Emotionally Abused: Not on file  . Physically Abused: Not on file  . Sexually Abused: Not on file    FAMILY HISTORY: Family History  Problem Relation Age of Onset  . Stroke Mother   . Hypertension  Father   . Stroke Father   . Breast cancer Neg Hx     ALLERGIES:  is allergic to terbinafine.  MEDICATIONS:  Current Outpatient Medications  Medication Sig Dispense Refill  . acetaminophen (TYLENOL) 325 MG tablet Take 325 mg by mouth every 6 (six) hours as needed for mild pain or moderate pain.     Marland Kitchen acyclovir (ZOVIRAX) 400 MG tablet ONE PILL A DAY [TO PREVENT SHINGLES] 90 tablet 1  . atenolol (TENORMIN) 50 MG tablet Take 1 tablet by mouth 2 (two) times daily.    . calcium carbonate (OSCAL) 1500 (600 Ca) MG TABS tablet Take 600 mg of elemental calcium by mouth daily with breakfast.    . Cholecalciferol (VITAMIN D) 2000 units tablet Take 1 tablet by mouth daily.    . Gilteritinib Fumarate (XOSPATA) 40 MG TABS Take 120 mg by mouth daily. 90 tablet 2  . heparin lock flush 100 UNIT/ML SOLN injection Inject into the vein.    . Infant Care Products (DERMACLOUD) CREA Apply topically.    Marland Kitchen KLOR-CON M20 20 MEQ tablet TAKE 1 TABLET BY MOUTH TWICE A DAY 180 tablet 0  . levofloxacin (LEVAQUIN) 250 MG tablet Take 1 tablet (250 mg total) by mouth daily. 30 tablet 3  . losartan-hydrochlorothiazide (HYZAAR) 50-12.5 MG tablet Take 1 tablet by mouth daily.    . magic mouthwash w/lidocaine SOLN Take 5 mLs by mouth 4 (four) times daily as needed for mouth pain. 480 mL 3  . magnesium hydroxide (MILK OF MAGNESIA) 400 MG/5ML suspension Take 5 mLs by mouth every 4 (four) hours as needed.    . ondansetron (ZOFRAN) 8 MG tablet Take by mouth every 8 (eight) hours as needed for nausea or vomiting.    . polyethylene glycol (MIRALAX / GLYCOLAX) 17 g packet Take 17 g by mouth daily.    . posaconazole (NOXAFIL) 100 MG TBEC delayed-release tablet Take 1 tablet (100 mg total) by mouth daily. 30 tablet 3  . senna (SENOKOT) 8.6 MG tablet Take 2 tablets by mouth 2 (two) times daily.     No current facility-administered medications for this visit.   Facility-Administered Medications Ordered in Other Visits  Medication Dose  Route Frequency Provider Last Rate Last Admin  . 0.9 %  sodium chloride infusion   Intravenous Continuous Jacquelin Hawking, NP   Stopped at 02/05/19 1630  . 0.9 %  sodium chloride infusion   Intravenous Continuous Cammie Sickle, MD 999 mL/hr at 08/08/19 0951 New Bag at 08/08/19 0951  . heparin lock flush 100 unit/mL  250 Units Intravenous Once Charlaine Dalton R, MD      . sodium chloride flush (NS) 0.9 % injection 10 mL  10 mL Intravenous PRN Cammie Sickle, MD   10 mL at 08/08/19 0815    PHYSICAL  EXAMINATION: ECOG PERFORMANCE STATUS: 1 - Symptomatic but completely ambulatory  Vitals:   08/08/19 0835 08/08/19 0838  BP: (!) 162/85   Pulse: 65   Resp: 18   Temp:  (!) 96 F (35.6 C)  SpO2: 96%    Filed Weights   08/08/19 0835  Weight: 101 lb 12.8 oz (46.2 kg)    Physical Exam  Constitutional: She is oriented to person, place, and time and well-developed, well-nourished, and in no distress.  PICC line in place.  No active bleeding.  Masked.  In a wheelchair.  Accompanied by husband.  HENT:  Head: Normocephalic and atraumatic.  Mouth/Throat: Oropharynx is clear and moist. No oropharyngeal exudate.  Ecchymosis noted in the buccal mucosa.  Bleeding noted from the right external ear.  Eyes: Conjunctivae are normal. No scleral icterus.  Cardiovascular: Normal rate and regular rhythm.  Pulmonary/Chest: Effort normal and breath sounds normal. No respiratory distress.  Abdominal: Soft. Bowel sounds are normal. She exhibits no distension and no mass. There is no abdominal tenderness. There is no rebound and no guarding.  Musculoskeletal:        General: No tenderness or edema. Normal range of motion.     Cervical back: Normal range of motion and neck supple.  Neurological: She is alert and oriented to person, place, and time.  Skin: Skin is warm.  Multiple bruises on lower legs, few on hands and arms. Torso bruising improved/mostly resolved  Psychiatric: Affect  normal.    LABORATORY DATA:  I have reviewed the data as listed Lab Results  Component Value Date   WBC 37.2 (H) 08/08/2019   HGB 8.0 (L) 08/08/2019   HCT 26.0 (L) 08/08/2019   MCV 87.2 08/08/2019   PLT 32 (L) 08/08/2019   Recent Labs    07/29/19 0828 08/05/19 0810 08/08/19 0811  NA 137 138 138  K 4.0 3.8 3.9  CL 100 102 103  CO2 '24 24 23  '$ GLUCOSE 184* 180* 136*  BUN 33* 47* 56*  CREATININE 1.00 1.21* 1.48*  CALCIUM 8.8* 9.1 8.9  GFRNONAA 52* 41* 32*  GFRAA >60 48* 38*  PROT 5.8* 5.6* 5.6*  ALBUMIN 3.2* 3.1* 3.1*  AST 103* 168* 303*  ALT 48* 45* 51*  ALKPHOS 92 96 118  BILITOT 0.3 0.5 0.4    RADIOGRAPHIC STUDIES: I have personally reviewed the radiological images as listed and agreed with the findings in the report. No results found.  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 currently on Gilterinib 120 mg/day.   #Given the elevated/rising white count of 30,000; lack of any significant improvement of the hematological parameters needing blood transfusion platelet transfusion-I am concerned about progression of disease.  I would recommend a bone marrow biopsy for further evaluation.  #Elevated AST up to 300/nausea/GFR 30s [baseline 60-acute renal failure]-concern for differentiation syndrome versus progression of disease.  I would recommend holding gilteritinib at this time.  Recommend dexamethasone 10 mg IV every 12 for the next 3 days.  I would recommend admission to hospital for close follow-up over the next few days; also recommend lipase amylase ultrasound abdomen for further work-up.  # Anemia/thrombocytopenia-White count-33 ANC pending; /hemoglobin 8  platelets-32 secondary to underlying acute myeloid leukemia/Gilteritinib.  See above/hold transfusions today.  #Supportive care for  AML-  -Continue Noxafil/posaconazole '100mg'$  and acyclovir 400 mg;  levaquin 250 mgContinue PICC line dressing changes weekly  #Discussed with Dr.Niu/hospitalist-who kindly agreed to admit the patient.  We will follow the patient closely.   #I discussed the patient's care with/patient and husband detail; they are in agreement. ---------------------------- Disposition: #  No PRBC/ platelets # IVFs 500 cc over 1 hour; dex 10 mg IV x1.zofran # hospital admission- Dr.B

## 2019-08-08 NOTE — Patient Instructions (Addendum)
#   Nexium BID prior to 1 hour BF/Supper

## 2019-08-08 NOTE — H&P (Signed)
History and Physical    Kathryn Hahn JXB:[id number] DOB: 1934/03/23 DOA: 08/08/2019  Referring MD/NP/PA:   PCP: Rusty Aus, MD   Patient coming from:  The patient is coming from home.  At baseline, pt is independent for most of ADL.        Chief Complaint: Generalized weakness  HPI: Kathryn Hahn is a 86 y.o. female with medical history significant of relapsed acute myeloid leukemia, hypertension, hyperlipidemia, breast cancer, anemia, thrombocytopenia, who is directly admitted from cancer center per Dr. Rogue Bussing due to worsening liver function and renal function.  Pt has generalized weakness. She has nausea, no vomiting or abdominal pain. She has acid reflux symptoms. She has occasional mild diarrhea.  Patient has mild dry cough and SOB, but no fever, chills, or chest pain. No symptoms of UTI or unilateral weakness. She has gum bleeding sometimes. Pt was seen by Dr. Rogue Bussing in cancer center, and was found to have AKI with creatinine 1.48 (1.00 on 07/29/2019) and BUN 56. She also has worsening liver function with ALP 118, AST 303, ALT 51, total bilirubin 0.4 (previously AST 168, ALT 45 recently). Dr. Rogue Bussing will consult on this case.  He recommended to start patient with steroid. Pt is accepted to med-surg bed as inpt.   Review of Systems:   General: no fevers, chills, no body weight gain, has poor appetite, has fatigue HEENT: no blurry vision, hearing changes or sore throat Respiratory: no dyspnea, has coughing, no wheezing CV: no chest pain, no palpitations GI: has nausea, no vomiting, abdominal pain, has diarrhea, no constipation GU: no dysuria, burning on urination, increased urinary frequency, hematuria  Ext: no leg edema Neuro: no unilateral weakness, numbness, or tingling, no vision change or hearing loss Skin: no rash, no skin tear. MSK: No muscle spasm, no deformity, no limitation of range of movement in spin Heme: has gum bleeding Travel history: No recent  long distant travel.  Allergy:  Allergies  Allergen Reactions  . Terbinafine Rash and Swelling    Past Medical History:  Diagnosis Date  . Anemia   . Arthritis   . Breast cancer ([place]) 2003   RT LUMPECTOMY  . Edema leg   . History of breast cancer 2003   post lumpectomy  . History of kidney stones   . Hyperlipemia, mixed   . Hypertension, essential   . Leukemia ([place])   . Leukocytosis   . MDS (myelodysplastic syndrome) ([place])   . Personal history of radiation therapy 2003   BREAST CA  . Renal stones   . Vitamin D deficiency     Past Surgical History:  Procedure Laterality Date  . BREAST BIOPSY Right 2003   Postive for cancer  . BREAST LUMPECTOMY Right 2003   BREAST CA  . KIDNEY STONE SURGERY Right     Social History:  reports that she has never smoked. She has never used smokeless tobacco. She reports current alcohol use of about 3.0 standard drinks of alcohol per week. She reports that she does not use drugs.  Family History:  Family History  Problem Relation Age of Onset  . Stroke Mother   . Hypertension Father   . Stroke Father   . Breast cancer Neg Hx      Prior to Admission medications   Medication Sig Start Date End Date Taking? Authorizing Provider  acetaminophen (TYLENOL) 325 MG tablet Take 325 mg by mouth every 6 (six) hours as needed for mild pain or moderate pain.  [provider]  acyclovir (ZOVIRAX) 400 MG tablet ONE PILL A DAY [TO PREVENT SHINGLES] 03/07/19   Cammie Sickle, MD  atenolol (TENORMIN) 50 MG tablet Take 1 tablet by mouth 2 (two) times daily.    [provider]  calcium carbonate (OSCAL) 1500 (600 Ca) MG TABS tablet Take 600 mg of elemental calcium by mouth daily with breakfast.    [provider]  Cholecalciferol (VITAMIN D) 2000 units tablet Take 1 tablet by mouth daily.    [provider]  Gilteritinib Fumarate (XOSPATA) 40 MG TABS Take 120 mg by mouth daily. 06/23/19   Cammie Sickle,  MD  heparin lock flush 100 UNIT/ML SOLN injection Inject into the vein.    [provider]  Infant Care Products (DERMACLOUD) CREA Apply topically.    [provider]  KLOR-CON M20 20 MEQ tablet TAKE 1 TABLET BY MOUTH TWICE A DAY 06/23/19   Cammie Sickle, MD  levofloxacin (LEVAQUIN) 250 MG tablet Take 1 tablet (250 mg total) by mouth daily. 05/05/19   Cammie Sickle, MD  losartan-hydrochlorothiazide (HYZAAR) 50-12.5 MG tablet Take 1 tablet by mouth daily.    [provider]  magic mouthwash w/lidocaine SOLN Take 5 mLs by mouth 4 (four) times daily as needed for mouth pain. 07/14/19   Cammie Sickle, MD  magnesium hydroxide (MILK OF MAGNESIA) 400 MG/5ML suspension Take 5 mLs by mouth every 4 (four) hours as needed.    [provider]  ondansetron (ZOFRAN) 8 MG tablet Take by mouth every 8 (eight) hours as needed for nausea or vomiting.    [provider]  polyethylene glycol (MIRALAX / GLYCOLAX) 17 g packet Take 17 g by mouth daily.    [provider]  posaconazole (NOXAFIL) 100 MG TBEC delayed-release tablet Take 1 tablet (100 mg total) by mouth daily. 06/03/19   Cammie Sickle, MD  senna (SENOKOT) 8.6 MG tablet Take 2 tablets by mouth 2 (two) times daily.    [provider]    Physical Exam: Vitals:   08/08/19 1236 08/08/19 1400 08/08/19 1447 08/08/19 1619  BP: 138/69   128/78  Pulse: 66   69  Resp: 14   14  Temp: 98.4 F (36.9 C)   98.3 F (36.8 C)  TempSrc: Oral     SpO2: 100%   96%  Weight:  46.2 kg 47 kg   Height:   '4\' 11"'$  (1.499 m)    General: Not in acute distress. Pale looking. HEENT:       Eyes: PERRL, EOMI, no scleral icterus.       ENT: No discharge from the ears and nose, no pharynx injection, no tonsillar enlargement.        Neck: No JVD, no bruit, no mass felt. Heme: No neck lymph node enlargement. Cardiac: S1/S2, RRR, No murmurs, No gallops or rubs. Respiratory:  No rales,  wheezing, rhonchi or rubs. GI: Soft, nondistended, nontender, no rebound pain, no organomegaly, BS present. GU: No hematuria Ext: No pitting leg edema bilaterally. 2+DP/PT pulse bilaterally. Musculoskeletal: No joint deformities, No joint redness or warmth, no limitation of ROM in spin. Skin: No rashes.  Neuro: Alert, oriented X3, cranial nerves II-XII grossly intact, moves all extremities normally.  Psych: Patient is not psychotic, no suicidal or hemocidal ideation.  Labs on Admission: I have personally reviewed following labs and imaging studies  CBC: Recent Labs  Lab 08/05/19 0810 08/08/19 0811  WBC 18.6* 37.2*  NEUTROABS 2.2 6.3  HGB 7.5* 8.0*  HCT 24.7* 26.0*  MCV 88.8 87.2  PLT 17* 32*   Basic Metabolic Panel: Recent Labs  Lab 08/05/19 0810 08/08/19 0811  NA 138 138  K 3.8 3.9  CL 102 103  CO2 24 23  GLUCOSE 180* 136*  BUN 47* 56*  CREATININE 1.21* 1.48*  CALCIUM 9.1 8.9   GFR: Estimated Creatinine Clearance: 19.6 mL/min (A) (by C-G formula based on SCr of 1.48 mg/dL (H)). Liver Function Tests: Recent Labs  Lab 08/05/19 0810 08/08/19 0811  AST 168* 303*  ALT 45* 51*  ALKPHOS 96 118  BILITOT 0.5 0.4  PROT 5.6* 5.6*  ALBUMIN 3.1* 3.1*   Recent Labs  Lab 08/08/19 0811  LIPASE 92*  AMYLASE 135*   No results for input(s): AMMONIA in the last 168 hours. Coagulation Profile: Recent Labs  Lab 08/08/19 1313  INR 1.7*   Cardiac Enzymes: No results for input(s): CKTOTAL, CKMB, CKMBINDEX, TROPONINI in the last 168 hours. BNP (last 3 results) No results for input(s): PROBNP in the last 8760 hours. HbA1C: No results for input(s): HGBA1C in the last 72 hours. CBG: No results for input(s): GLUCAP in the last 168 hours. Lipid Profile: No results for input(s): CHOL, HDL, LDLCALC, TRIG, CHOLHDL, LDLDIRECT in the last 72 hours. Thyroid Function Tests: No results for input(s): TSH, T4TOTAL, FREET4, T3FREE, THYROIDAB in the last 72 hours. Anemia Panel: No  results for input(s): VITAMINB12, FOLATE, FERRITIN, TIBC, IRON, RETICCTPCT in the last 72 hours. Urine analysis: No results found for: COLORURINE, APPEARANCEUR, LABSPEC, PHURINE, GLUCOSEU, HGBUR, BILIRUBINUR, KETONESUR, PROTEINUR, UROBILINOGEN, NITRITE, LEUKOCYTESUR Sepsis Labs: '@LABRCNTIP'$ (procalcitonin:4,lacticidven:4) )No results found for this or any previous visit (from the past 240 hour(s)).   Radiological Exams on Admission: No results found.   EKG: Independently reviewed. Sinus rhythm, QTC 482, no ischemic change   Assessment/Plan Principal Problem:   MDS/MPN (myelodysplastic/myeloproliferative neoplasms) (HCC) Active Problems:   Essential hypertension   Acute myeloid leukemia not having achieved remission (HCC)   Thrombocytopenia (HCC)   AKI (acute kidney injury) ([place])   Abnormal LFTs   MDS/MPN (myelodysplastic/myeloproliferative neoplasms) ([place]): pt is is on Gliterinib currently. Pt is also on acyclovir, Noxafil for prophylaxis. Dr. Recommended will see patient in the morning.  -will admit to med-surge bed as inpt -Hold Gliterinib, acyclovir, Noxafil per Dr. Rogue Bussing due to worsening liver function -Dr. Rogue Bussing ordered CT bone marrow biopsy, LDH, fibrinogen, uric acid  Abnormal LFTs: Possibly due to chemotherapy per Dr. Rogue Bussing. -Hold Gliterinib, acyclovir, Noxafil  -Abdominal ultrasound was ordered by Dr. Rogue Bussing -avoid using tylenol  HTN:  -Continue home medications: Atenolol -hold Hyzaar due to worsening renal function -hydralazine prn  Acute myeloid leukemia not having achieved remission [facility]): Baseline Cre is ~1.0, pt's Cre is 1.48 and BUn 46 on admission. Likely due to prerenal secondary to dehydration and continuation of ARB, diuretics - IVF: 500 cc of NS, then 75 cc/h - Follow up renal function by BMP - Avoid using renal toxic medications, hypotension and contrast dye (or carefully use) - Hold Hyzarr  Thrombocytopenia ([place]): Platelet 32.  No  active bleeding. -f/u by CBC    Inpatient status:  # Patient requires inpatient status due to high intensity of service, high risk for further deterioration and high frequency of surveillance required.  I certify that at the point of admission it is my clinical judgment that the patient will require inpatient hospital care spanning beyond 2 midnights from the point of admission.   This patient has multiple chronic comorbidities  including relapsed acute myeloid leukemia, hypertension, hyperlipidemia, breast cancer, anemia, thrombocytopenia . Now patient has presenting with worsening liver function and AKI . The worrisome physical exam findings include pale looking . The initial radiographic and laboratory data are worrisome because of worsening liver function and AKI  . current medical needs: please see my assessment and plan . Predictability of an adverse outcome (risk): Patient has multiple comorbidities as listed above. Now presents with worsening liver function and AKI. Patient's presentation is highly complicated.  Patient is at high risk of deteriorating.  Will need to be treated in hospital for at least 2 days.      DVT ppx: SCD Code Status: DNR (I discussed with patient in the presence of her husband and explained the meaning of CODE STATUS. Patient wants sto be DNR) Family Communication:  Yes, patient's husband at bed side Disposition Plan:  Anticipate discharge back to previous home environment Consults called:  Dr. Rogue Bussing of oncology Admission status: Med-surg bed as inpt      Date of Service 08/08/2019    [facility]   If 7PM-7AM, please contact night-coverage [email or web address] 08/08/2019, 4:22 PM

## 2019-08-08 NOTE — Assessment & Plan Note (Addendum)
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 currently on Gilterinib 120 mg/day.   #Given the elevated/rising white count of 30,000; lack of any significant improvement of the hematological parameters needing blood transfusion platelet transfusion-I am concerned about progression of disease.  I would recommend a bone marrow biopsy for further evaluation.  #Elevated AST up to 300/nausea/GFR 30s [baseline 60-acute renal failure]-concern for differentiation syndrome versus progression of disease.  I would recommend holding gilteritinib at this time.  Recommend dexamethasone 10 mg IV every 12 for the next 3 days.  I would recommend admission to hospital for close follow-up over the next few days; also recommend lipase amylase ultrasound abdomen for further work-up.  # Anemia/thrombocytopenia-White count-33 ANC pending; /hemoglobin 8 platelets-32 secondary to underlying acute myeloid leukemia/Gilteritinib.  See above/hold transfusions today.  #Supportive care for AML-  -Continue Noxafil/posaconazole '100mg'$  and acyclovir 400 mg;  levaquin 250 mgContinue PICC line dressing changes weekly  #Discussed with Dr.Niu/hospitalist-who kindly agreed to admit the patient.  We will follow the patient closely.   #I discussed the patient's care with/patient and husband detail; they are in agreement. ---------------------------- Disposition: #  No PRBC/ platelets # IVFs 500 cc over 1 hour; dex 10 mg IV x1.zofran # hospital admission- Dr.B

## 2019-08-09 ENCOUNTER — Inpatient Hospital Stay: Payer: Medicare Other

## 2019-08-09 DIAGNOSIS — D469 Myelodysplastic syndrome, unspecified: Secondary | ICD-10-CM

## 2019-08-09 LAB — CBC WITH DIFFERENTIAL/PLATELET
Abs Immature Granulocytes: 2 10*3/uL — ABNORMAL HIGH (ref 0.00–0.07)
Band Neutrophils: 2 %
Basophils Absolute: 0 10*3/uL (ref 0.0–0.1)
Basophils Relative: 0 %
Eosinophils Absolute: 0 10*3/uL (ref 0.0–0.5)
Eosinophils Relative: 0 %
HCT: 22.8 % — ABNORMAL LOW (ref 36.0–46.0)
Hemoglobin: 7.3 g/dL — ABNORMAL LOW (ref 12.0–15.0)
Lymphocytes Relative: 34 %
Lymphs Abs: 13.6 10*3/uL — ABNORMAL HIGH (ref 0.7–4.0)
MCH: 27.9 pg (ref 26.0–34.0)
MCHC: 32 g/dL (ref 30.0–36.0)
MCV: 87 fL (ref 80.0–100.0)
Metamyelocytes Relative: 2 %
Monocytes Absolute: 15.6 10*3/uL — ABNORMAL HIGH (ref 0.1–1.0)
Monocytes Relative: 39 %
Myelocytes: 3 %
Neutro Abs: 5.6 10*3/uL (ref 1.7–7.7)
Neutrophils Relative %: 12 %
Other: 8 %
Platelets: 22 10*3/uL — CL (ref 150–400)
RBC: 2.62 MIL/uL — ABNORMAL LOW (ref 3.87–5.11)
RDW: 17.6 % — ABNORMAL HIGH (ref 11.5–15.5)
Smear Review: DECREASED
WBC: 39.9 10*3/uL — ABNORMAL HIGH (ref 4.0–10.5)
nRBC: 2.9 % — ABNORMAL HIGH (ref 0.0–0.2)
nRBC: 6 /100 WBC — ABNORMAL HIGH

## 2019-08-09 LAB — PHOSPHORUS: Phosphorus: 4.6 mg/dL (ref 2.5–4.6)

## 2019-08-09 LAB — FIBRINOGEN: Fibrinogen: 96 mg/dL — CL (ref 210–475)

## 2019-08-09 LAB — MAGNESIUM: Magnesium: 1.8 mg/dL (ref 1.7–2.4)

## 2019-08-09 LAB — COMPREHENSIVE METABOLIC PANEL
ALT: 47 U/L — ABNORMAL HIGH (ref 0–44)
AST: 227 U/L — ABNORMAL HIGH (ref 15–41)
Albumin: 2.7 g/dL — ABNORMAL LOW (ref 3.5–5.0)
Alkaline Phosphatase: 95 U/L (ref 38–126)
Anion gap: 10 (ref 5–15)
BUN: 55 mg/dL — ABNORMAL HIGH (ref 8–23)
CO2: 22 mmol/L (ref 22–32)
Calcium: 8 mg/dL — ABNORMAL LOW (ref 8.9–10.3)
Chloride: 107 mmol/L (ref 98–111)
Creatinine, Ser: 1.4 mg/dL — ABNORMAL HIGH (ref 0.44–1.00)
GFR calc Af Amer: 40 mL/min — ABNORMAL LOW (ref >60)
GFR calc non Af Amer: 35 mL/min — ABNORMAL LOW (ref >60)
Glucose, Bld: 147 mg/dL — ABNORMAL HIGH (ref 70–99)
Potassium: 4 mmol/L (ref 3.5–5.1)
Sodium: 139 mmol/L (ref 135–145)
Total Bilirubin: 0.7 mg/dL (ref 0.3–1.2)
Total Protein: 5 g/dL — ABNORMAL LOW (ref 6.5–8.1)

## 2019-08-09 LAB — CBC
HCT: 25.7 % — ABNORMAL LOW (ref 36.0–46.0)
Hemoglobin: 8.2 g/dL — ABNORMAL LOW (ref 12.0–15.0)
MCH: 28 pg (ref 26.0–34.0)
MCHC: 31.9 g/dL (ref 30.0–36.0)
MCV: 87.7 fL (ref 80.0–100.0)
Platelets: 50 10*3/uL — ABNORMAL LOW (ref 150–400)
RBC: 2.93 MIL/uL — ABNORMAL LOW (ref 3.87–5.11)
RDW: 17.2 % — ABNORMAL HIGH (ref 11.5–15.5)
WBC: 76.6 10*3/uL (ref 4.0–10.5)
nRBC: 4 % — ABNORMAL HIGH (ref 0.0–0.2)

## 2019-08-09 LAB — LIPASE, BLOOD: Lipase: 86 U/L — ABNORMAL HIGH (ref 11–51)

## 2019-08-09 LAB — APTT: aPTT: 43 seconds — ABNORMAL HIGH (ref 24–36)

## 2019-08-09 LAB — PROTIME-INR
INR: 1.7 — ABNORMAL HIGH (ref 0.8–1.2)
Prothrombin Time: 19.5 seconds — ABNORMAL HIGH (ref 11.4–15.2)

## 2019-08-09 LAB — PREPARE RBC (CROSSMATCH)

## 2019-08-09 LAB — AMYLASE: Amylase: 126 U/L — ABNORMAL HIGH (ref 28–100)

## 2019-08-09 LAB — URIC ACID: Uric Acid, Serum: 13.4 mg/dL — ABNORMAL HIGH (ref 2.5–7.1)

## 2019-08-09 LAB — LACTATE DEHYDROGENASE: LDH: 6101 U/L — ABNORMAL HIGH (ref 98–192)

## 2019-08-09 LAB — GLUCOSE, CAPILLARY: Glucose-Capillary: 169 mg/dL — ABNORMAL HIGH (ref 70–99)

## 2019-08-09 MED ORDER — LEVOFLOXACIN 500 MG PO TABS: 250.0000 mg | ORAL_TABLET | Freq: Every day | ORAL | Status: DC

## 2019-08-09 MED ORDER — ACETAMINOPHEN 325 MG PO TABS
650.0000 mg | ORAL_TABLET | Freq: Once | ORAL | Status: AC
  Administered 2019-08-09: 650 mg via ORAL
  Filled 2019-08-09: qty 2

## 2019-08-09 MED ORDER — SODIUM CHLORIDE 0.9% IV SOLUTION: Freq: Once | INTRAVENOUS | Status: AC

## 2019-08-09 MED ORDER — SODIUM CHLORIDE 0.9 % IV SOLN: INTRAVENOUS | Status: AC

## 2019-08-09 MED ORDER — ACYCLOVIR 400 MG PO TABS: 400.0000 mg | ORAL_TABLET | Freq: Every day | ORAL | Status: DC

## 2019-08-09 MED ORDER — SODIUM CHLORIDE 0.9 % IV SOLN: INTRAVENOUS | Status: DC

## 2019-08-09 MED ORDER — POTASSIUM CHLORIDE CRYS ER 20 MEQ PO TBCR
20.0000 meq | EXTENDED_RELEASE_TABLET | Freq: Two times a day (BID) | ORAL | Status: DC
  Filled 2019-08-09: qty 1

## 2019-08-09 MED ORDER — POSACONAZOLE 100 MG PO TBEC: 100.0000 mg | DELAYED_RELEASE_TABLET | Freq: Every day | ORAL | Status: DC

## 2019-08-09 MED ORDER — ACYCLOVIR 400 MG PO TABS
400.0000 mg | ORAL_TABLET | Freq: Every day | ORAL | Status: DC
  Filled 2019-08-09: qty 1

## 2019-08-09 MED ORDER — SODIUM CHLORIDE 0.9 % IV BOLUS
1000.0000 mL | Freq: Once | INTRAVENOUS | Status: AC
  Administered 2019-08-09: 1000 mL via INTRAVENOUS

## 2019-08-09 MED ORDER — ALLOPURINOL 100 MG PO TABS
50.0000 mg | ORAL_TABLET | ORAL | Status: DC
  Administered 2019-08-09: 50 mg via ORAL
  Filled 2019-08-09 (×2): qty 0.5

## 2019-08-09 MED ORDER — LEVOFLOXACIN 500 MG PO TABS
250.0000 mg | ORAL_TABLET | Freq: Every day | ORAL | Status: DC
  Filled 2019-08-09: qty 1

## 2019-08-09 MED ORDER — POSACONAZOLE 100 MG PO TBEC
100.0000 mg | DELAYED_RELEASE_TABLET | Freq: Every day | ORAL | Status: DC
  Filled 2019-08-09 (×3): qty 1

## 2019-08-09 MED ORDER — DIPHENHYDRAMINE HCL 50 MG/ML IJ SOLN
INTRAMUSCULAR | Status: AC
  Administered 2019-08-09: 25 mg via INTRAVENOUS
  Filled 2019-08-09: qty 1

## 2019-08-09 MED ORDER — ACYCLOVIR 200 MG PO CAPS
400.0000 mg | ORAL_CAPSULE | Freq: Every day | ORAL | Status: DC
  Filled 2019-08-09: qty 2

## 2019-08-09 MED ORDER — SODIUM CHLORIDE 0.9 % IV SOLN: 6.0000 mg | Freq: Once | INTRAVENOUS | Status: DC

## 2019-08-09 MED ORDER — DIPHENHYDRAMINE HCL 50 MG/ML IJ SOLN: 25.0000 mg | Freq: Once | INTRAMUSCULAR | Status: AC

## 2019-08-09 MED ORDER — DEXAMETHASONE SODIUM PHOSPHATE 10 MG/ML IJ SOLN
10.0000 mg | Freq: Two times a day (BID) | INTRAMUSCULAR | Status: DC
  Administered 2019-08-10: 10 mg via INTRAVENOUS
  Filled 2019-08-09 (×3): qty 1

## 2019-08-09 NOTE — Progress Notes (Signed)
Patient ID: Kathryn Hahn, female   DOB: Oct 26, 1933, 86 y.o.   MRN: [id number]   called by RN regarding episode of vomiting x 1 BP elevated. Pt settled after giving IV zofran x 1 Started becoming diaphoretic and BP elevated with pt less responsive. Rapid called, BT stopped,IV NS bolus, IV benadryl x1 orderd By Dr Priscella Mann who assessed the pt at bedside Blood bank aware per pt's RN Lauren and tranfusion reaction w/u to be initiated Husband aware of above. He understand pt is quiet sick. Dr Mike Gip aware of above also.

## 2019-08-09 NOTE — Progress Notes (Signed)
Sharion Settler NP assessed patient at bedside. Received call from Dr. Fritzi Mandes, updated lethargy, vitals WNL, hemoglobin 8.2. Discussed lethargy is expected due to benadryl and zofran given on prior shift. Will continue to monitor patient.

## 2019-08-09 NOTE — Progress Notes (Signed)
Patient is lethargic, answers at times to voice. BP 129/77, HR 75, 02 sats 100% at 2L, oral temp 98.3. WBC 76.6, notified on call provider Sharion Settler NP/ voiced concern over lethargy, requested to assess patient, provider stated to allow the patient to rest due to benadryl, if lethargy persists she would assess the patient.

## 2019-08-09 NOTE — Progress Notes (Signed)
Patient resting quietly at this time.

## 2019-08-09 NOTE — Progress Notes (Signed)
[place] [facility] [place] NAME: Kathryn Hahn    MR#:  [id number]  DATE OF BIRTH:  1934/06/12  SUBJECTIVE:  patient came in with generalized weakness no fever overall feels better husband in the room no active bleeding.  REVIEW OF SYSTEMS:   Review of Systems  Constitutional: Positive for malaise/fatigue. Negative for chills, fever and weight loss.  HENT: Negative for ear discharge, ear pain and nosebleeds.   Eyes: Negative for blurred vision, pain and discharge.  Respiratory: Negative for sputum production, shortness of breath, wheezing and stridor.   Cardiovascular: Negative for chest pain, palpitations, orthopnea and PND.  Gastrointestinal: Negative for abdominal pain, diarrhea, nausea and vomiting.  Genitourinary: Negative for frequency and urgency.  Musculoskeletal: Negative for back pain and joint pain.  Neurological: Positive for weakness. Negative for sensory change, speech change and focal weakness.  Psychiatric/Behavioral: Negative for depression and hallucinations. The patient is not nervous/anxious.    Tolerating Diet:yes Tolerating PT:   DRUG ALLERGIES:   Allergies  Allergen Reactions  . Terbinafine Rash and Swelling    VITALS:  Blood pressure 129/68, pulse 68, temperature 97.8 F (36.6 C), temperature source Oral, resp. rate 18, height 4\' 11"  (1.499 m), weight 47 kg, SpO2 99 %.  PHYSICAL EXAMINATION:   Physical Exam  GENERAL:  86 y.o.-year-old patient lying in the bed with no acute distress. Weak Pallor+ EYES: Pupils equal, round, reactive to light and accommodation. No scleral icterus.   HEENT: Head atraumatic, normocephalic. Oropharynx and nasopharynx clear.  Lips old blood NECK:  Supple, no jugular venous distention. No thyroid enlargement, no tenderness.  LUNGS: Normal breath sounds bilaterally, no wheezing, rales, rhonchi. No use of accessory muscles of respiration.  CARDIOVASCULAR: S1, S2 normal. No  murmurs, rubs, or gallops.  ABDOMEN: Soft, nontender, nondistended. Bowel sounds present. No organomegaly or mass.  EXTREMITIES: No cyanosis, clubbing  ++ edema b/l.    NEUROLOGIC: Cranial nerves II through XII are intact. No focal Motor or sensory deficits b/l.   PSYCHIATRIC:  patient is alert and oriented x 3.  SKIN: No obvious rash, lesion, or ulcer.   LABORATORY PANEL:  CBC Recent Labs  Lab 08/09/19 0559  WBC 39.9*  HGB 7.3*  HCT 22.8*  PLT 22*    Chemistries  Recent Labs  Lab 08/09/19 0559  NA 139  K 4.0  CL 107  CO2 22  GLUCOSE 147*  BUN 55*  CREATININE 1.40*  CALCIUM 8.0*  MG 1.8  AST 227*  ALT 47*  ALKPHOS 95  BILITOT 0.7   Cardiac Enzymes No results for input(s): TROPONINI in the last 168 hours. RADIOLOGY:  US Abdomen Complete  Result Date: 08/09/2019 CLINICAL DATA:  86 year old female with elevated LFTs and worsening renal function. History of relapsed AML. EXAM: ABDOMEN ULTRASOUND COMPLETE COMPARISON:  02/29/2016 ultrasound FINDINGS: Gallbladder: A 7 mm nonshadowing nonmobile gallstone versus polyp is noted. There is no evidence of gallbladder wall thickening, pericholecystic fluid or sonographic Murphy sign. Common bile duct: Diameter: 5 mm. No intrahepatic or extrahepatic biliary dilatation. Liver: No focal lesion identified. Within normal limits in parenchymal echogenicity. Portal vein is patent on color Doppler imaging with normal direction of blood flow towards the liver. IVC: No abnormality visualized. Pancreas: Visualized portion unremarkable. Spleen: UPPER limits normal spleen size with a splenic volume of 350 cc. No focal splenic abnormalities are noted. Right Kidney: Length: 10.1 cm. Diffusely increased renal echogenicity noted without hydronephrosis or mass. No definite renal calculi are noted.  Left Kidney: Length: 9.9 cm. Diffusely increased renal echogenicity noted without hydronephrosis or mass. No definite renal calculi are identified. Abdominal  aorta: No aneurysm visualized. Other findings: None. IMPRESSION: 1. 7 mm nonshadowing nonmobile gallstone versus gallbladder polyp. No evidence of acute cholecystitis or biliary dilatation. 2. Echogenic kidneys, compatible with medical renal disease. No hydronephrosis. 3. UPPER limits normal spleen size. 4. Unremarkable liver. 5. Trace ascites. Electronically Signed   By: Margarette Canada M.D.   On: 08/09/2019 08:49   ASSESSMENT AND PLAN:  Kathryn Hahn is a 86 y.o. female with medical history significant of relapsed acute myeloid leukemia, hypertension, hyperlipidemia, breast cancer, anemia, thrombocytopenia, who is directly admitted from cancer center per Dr. Rogue Bussing due to worsening liver function and renal function.   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 Dr. Recommended will see patient in the morning. -Abnormal LFTs: Possibly due to chemotherapy per Dr. Rogue Bussing. -resume acyclovir, Noxafil and levaquin--prohylactic dosing -Abdominal ultrasound was ordered by Dr. 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  No active bleeding. -f/u by CBC  Acute renal failure in the setting of Tumor lysis syndrome and AML -cont IVF -monitor input/output  -avoid nephrotoxins   Procedures: Family communication :pt and husband in the room Consults :Oncology Discharge Disposition :TBD CODE STATUS: Dnr prior to admission DVT Prophylaxis :SCD due to severe TCP Barriers to  discharge: tx for AML  TOTAL TIME TAKING CARE OF THIS PATIENT: *30* minutes.  >50% time spent on counselling and coordination of care  Note: This dictation was prepared with Dragon dictation along with smaller phrase technology. Any transcriptional errors that result from this process are unintentional.  Fritzi Mandes M.D    [facility]   CC: Primary care physician; Rusty Aus, MDPatient ID: Kathryn Hahn, female   DOB: [birth year], 86 y.o.   MRN: [id number]

## 2019-08-09 NOTE — Progress Notes (Signed)
   08/09/19 1600  Clinical Encounter Type  Visited With Patient and family together;Health care provider  Visit Type Initial  Referral From Nurse  Consult/Referral To Chaplain  Spiritual Encounters  Spiritual Needs Emotional  This was a Rapid Response:  Chaplain responded to room for support of staff, patient, and family. After seeing that everybody was okay, the chaplain left the room. This concludes this visit.   End of visit  No Further

## 2019-08-09 NOTE — Progress Notes (Signed)
Patient unable to take 2pm meds d/t feeling nauseated. NS bolus infusing at this time .  Post blood transfusion trough drawn from SL picc line and sent to blood bank

## 2019-08-09 NOTE — Significant Event (Signed)
Rapid Response Event Note  Overview: Time Called: [id number] Arrival Time: 1527 Event Type: Respiratory, Other (Comment)(? blood rx)  Initial Focused Assessment: called for RR due to questionable blood reaction. Pt hx leukemia, laying in bed, SOB, hypertensive.   Interventions: blood stopped, ns bolus' started, DR Noralee Chars at bedside, benedryl given. Updated husband on supporting symptoms at this point in time. Sreeneth updated Dr Fritzi Mandes also.  Plan of Care (if not transferred): Lauren, LPN to call if further assistance needed.  Event Summary: Name of Physician Notified: Dr Priscella Mann at 1530    at    Outcome: Stayed in room and stabalized  Event End Time: [place]

## 2019-08-09 NOTE — Progress Notes (Signed)
Patient RBC started infusing , VSS o2 was 86% placed on 2l and her oxygen is now 99%

## 2019-08-09 NOTE — Progress Notes (Signed)
Patient given PRN zofran , c/o feeling nauseated. Transport here to get patient for Ultrasound. Platelet is critical at 22, fibrinogen level 96. Dr. Posey Pronto is aware

## 2019-08-09 NOTE — Progress Notes (Addendum)
[facility] Hematology/Oncology Progress Note  Date of admission: 08/08/2019  Hospital day:  08/09/2019  Chief Complaint: Kathryn Hahn is a 86 y.o. 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  Subjective: Patient denies any chest pain.  She notes a "little" shortness of breath.  She denies any bleeding except from her lip (old).  Social History: The patient is alone today.  Allergies:  Allergies  Allergen Reactions  . Terbinafine Rash and Swelling    Scheduled Medications: . atenolol  50 mg Oral BID  . calcium carbonate  500 mg of elemental calcium Oral Q breakfast  . Chlorhexidine Gluconate Cloth  6 each Topical Daily  . cholecalciferol  2,000 Units Oral Daily  . dexamethasone  10 mg Intravenous Q12H  . dextromethorphan-guaiFENesin  1 tablet Oral BID  . pantoprazole  40 mg Oral Q1200  . senna  2 tablet Oral BID    Review of Systems  Constitutional: Positive for malaise/fatigue. Negative for chills, diaphoresis and fever.  HENT: Positive for hearing loss. Negative for congestion, ear pain, nosebleeds, sinus pain and sore throat.   Eyes: Negative.  Negative for blurred vision and double vision.  Respiratory: Positive for shortness of breath ("little"). Negative for cough, hemoptysis, sputum production and wheezing.   Cardiovascular: Negative.  Negative for chest pain, orthopnea, leg swelling and PND.  Gastrointestinal: Positive for heartburn (in the AM). Negative for abdominal pain, blood in stool, constipation, diarrhea (depends on what she eats), melena, nausea and vomiting.       Diminished appetite.  Eats small amounts slowly.  Genitourinary: Negative.  Negative for dysuria, frequency and urgency.  Musculoskeletal: Negative.  Negative for back pain, joint pain, myalgias and neck pain.  Skin: Negative for itching and rash.       Chronic  bruising.  Neurological: Positive for weakness (general). Negative for dizziness, tingling, tremors, sensory change, speech change, focal weakness and headaches.  Endo/Heme/Allergies: Bruises/bleeds easily.  Psychiatric/Behavioral: Negative for depression. The patient is not nervous/anxious and does not have insomnia.     Vitals: Blood pressure (!) 152/80, pulse 70, temperature 97.9 F (36.6 C), resp. rate 18, height '4\' 11"'$  (1.499 m), weight 103 lb 9.6 oz (47 kg), SpO2 97 %.   Physical Exam  Constitutional: She is oriented to person, place, and time.  Thin woman sitting up in bed eating breakfast in no acute distress.  HENT:  Head: Normocephalic and atraumatic.  Mouth/Throat: Oropharynx is clear and moist. No oropharyngeal exudate.  Short white hair.  Lower lip with old crusted blood.  Eyes: Pupils are equal, round, and reactive to light. Conjunctivae and EOM are normal. No scleral icterus.  Blue eyes.  Neck: No JVD present.  Cardiovascular: Normal rate, normal heart sounds and intact distal pulses.  No murmur heard. Left upper extremity PICC line.  Pulmonary/Chest: Effort normal and breath sounds normal. No respiratory distress. She has no wheezes. She has no rales.  Abdominal: Soft. Bowel sounds are normal. She exhibits no distension and no mass. There is no abdominal tenderness. There is no rebound and no guarding.  Musculoskeletal:        General: No tenderness or edema.     Cervical back: Normal range of motion.     Comments: Lower extremity ICDs in place.  Lymphadenopathy:       Head (right side): No preauricular, no posterior auricular and no occipital adenopathy present.  Head (left side): No preauricular, no posterior auricular and no occipital adenopathy present.    She has no cervical adenopathy.    She has no axillary adenopathy.       Right: No inguinal and no supraclavicular adenopathy present.       Left: No inguinal and no supraclavicular adenopathy present.   Neurological: She is alert and oriented to person, place, and time.  Skin: Skin is warm and dry. She is not diaphoretic. No erythema. There is pallor.  Upper extremity ecchymosis.  Petechiae.  Psychiatric: Mood, memory, affect and judgment normal.  Nursing note and vitals reviewed.    Results for orders placed or performed during the hospital encounter of 08/08/19 (from the past 48 hour(s))  Type and screen [place]     Status: None   Collection Time: 08/08/19  1:13 PM  Result Value Ref Range   ABO/RH(D) O POS    Antibody Screen NEG    Sample Expiration      08/11/2019,2359 Performed at [facility], [address] [place], [place] [address]   Protime-INR     Status: Abnormal   Collection Time: 08/08/19  1:13 PM  Result Value Ref Range   Prothrombin Time 19.8 (H) 11.4 - 15.2 seconds   INR 1.7 (H) 0.8 - 1.2    Comment: (NOTE) INR goal varies based on device and disease states. Performed at [facility], [address] [place], [place] [address]   APTT     Status: Abnormal   Collection Time: 08/08/19  1:13 PM  Result Value Ref Range   aPTT 44 (H) 24 - 36 seconds    Comment:        IF BASELINE aPTT IS ELEVATED, SUGGEST PATIENT RISK ASSESSMENT BE USED TO DETERMINE APPROPRIATE ANTICOAGULANT THERAPY. Performed at [facility], [place], [place] [address]   SARS CORONAVIRUS 2 (TAT 6-24 HRS) Nasopharyngeal Nasopharyngeal Swab     Status: None   Collection Time: 08/08/19  1:20 PM   Specimen: Nasopharyngeal Swab  Result Value Ref Range   SARS Coronavirus 2 NEGATIVE NEGATIVE    Comment: (NOTE) SARS-CoV-2 target nucleic acids are NOT DETECTED. The SARS-CoV-2 RNA is generally detectable in upper and lower respiratory specimens during the acute phase of infection. Negative results do not preclude SARS-CoV-2 infection, do not rule out co-infections with other pathogens, and should not be used as the sole basis  for treatment or other patient management decisions. Negative results must be combined with clinical observations, patient history, and epidemiological information. The expected result is Negative. Fact Sheet for Patients: SugarRoll.be Fact Sheet for Healthcare Providers: https://www.woods-mathews.com/ This test is not yet approved or cleared by the [place] FDA and  has been authorized for detection and/or diagnosis of SARS-CoV-2 by FDA under an Emergency Use Authorization (EUA). This EUA will remain  in effect (meaning this test can be used) for the duration of the COVID-19 declaration under Section 56 4(b)(1) of the Act, 21 U.S.C. section 360bbb-3(b)(1), unless the authorization is terminated or revoked sooner. Performed at [facility], [place] [address], [place],  [address]   Protime-INR     Status: Abnormal   Collection Time: 08/09/19  5:59 AM  Result Value Ref Range   Prothrombin Time 19.5 (H) 11.4 - 15.2 seconds   INR 1.7 (H) 0.8 - 1.2    Comment: (NOTE) INR goal varies based on device and disease states. Performed at [facility], [id number] [place], [place] [address]   APTT     Status: Abnormal   Collection Time: 08/09/19  5:59 AM  Result Value Ref Range   aPTT 43 (H) 24 - 36 seconds    Comment:        IF BASELINE aPTT IS ELEVATED, SUGGEST PATIENT RISK ASSESSMENT BE USED TO DETERMINE APPROPRIATE ANTICOAGULANT THERAPY. Performed at [facility], [address] [place], [place] [address]   Fibrinogen     Status: Abnormal   Collection Time: 08/09/19  5:59 AM  Result Value Ref Range   Fibrinogen 96 (LL) 210 - 475 mg/dL    Comment: CRITICAL RESULT CALLED TO, READ BACK BY AND VERIFIED WITH: [facility] FUENTES AT 5208 08/09/19.PMF Performed at [facility], [address] [place], [place] [address]   CBC with Differential/Platelet     Status: Abnormal   Collection Time: 08/09/19   5:59 AM  Result Value Ref Range   WBC 39.9 (H) 4.0 - 10.5 K/uL   RBC 2.62 (L) 3.87 - 5.11 MIL/uL   Hemoglobin 7.3 (L) 12.0 - 15.0 g/dL   HCT 22.8 (L) 36.0 - 46.0 %   MCV 87.0 80.0 - 100.0 fL   MCH 27.9 26.0 - 34.0 pg   MCHC 32.0 30.0 - 36.0 g/dL   RDW 17.6 (H) 11.5 - 15.5 %   Platelets 22 (LL) 150 - 400 K/uL    Comment: REPEATED TO VERIFY PLATELET COUNT CONFIRMED BY SMEAR Immature Platelet Fraction may be clinically indicated, consider ordering this additional test [id number] THIS CRITICAL RESULT HAS VERIFIED AND BEEN CALLED TO LAUREN LAND BY PAULA MARY FERREE ON 02 27 2021 AT 0754, AND HAS BEEN READ BACK.     nRBC 2.9 (H) 0.0 - 0.2 %   Neutrophils Relative % 12 %   Neutro Abs 5.6 1.7 - 7.7 K/uL   Band Neutrophils 2 %   Lymphocytes Relative 34 %   Lymphs Abs 13.6 (H) 0.7 - 4.0 K/uL   Monocytes Relative 39 %   Monocytes Absolute 15.6 (H) 0.1 - 1.0 K/uL   Eosinophils Relative 0 %   Eosinophils Absolute 0.0 0.0 - 0.5 K/uL   Basophils Relative 0 %   Basophils Absolute 0.0 0.0 - 0.1 K/uL   WBC Morphology SMUDGE CELLS    Smear Review PLATELETS APPEAR DECREASED    Other 8 %   nRBC 6 (H) 0 /100 WBC   Metamyelocytes Relative 2 %   Myelocytes 3 %   Abs Immature Granulocytes 2.00 (H) 0.00 - 0.07 K/uL   Schistocytes PRESENT    Tear Drop Cells PRESENT    Polychromasia PRESENT     Comment: Performed at [facility], [address], [place], [place] [address]   No results found.  Assessment:  Kathryn Hahn is a 86 y.o. female with relapsed AML with monocytic differentiation admitted with concern for progressive disease vs differentiation syndrome.  Bone marrow on 05/02/2019 revealed 50 to 60% involvement-NGS-FLT3 POSITIVE.  Bone marrow on 06/27/2019 revealed  27% blasts-partial response. currently on She has been on gilteritinib Dora Sims).  She has anemia and thrombocytopenia secondary to underlying disease/marrow involvement.  She has elevated LFTs.  Plan: 1. Acute  myelogenous leukemia  Hematocrit 26.0.  Hemoglobin 8.0.  MCV 87.2.  Platelets 32,000.  WBC 37,200 with 17% blasts on 08/08/2019.  Hematocrit 22.8.  Hemoglobin 7.3.  MCV 87.0.  Platelets 22,000.  WBC 39,900 on 08/09/2019.   LDH 6657 on 08/08/2019.  Maintain hemoglobin > 8.  Maintain platelets > 15,000.   Transfuse with irradiated PRBCs today.   Patient receives premeds of Tylenol alone as Benadryl makes her "woozy".   Discussed with nursing.  Maintain active type and screen.  Suspect progressive disease.   Discuss with Dr Rogue Bussing.   Consideration of initiation of [place] contacted.  Patient currently receiving Decadron 10 mg IV q 12 hours for possible differentiation syndrome.   Differentiation syndrome: fever, shortness of breath, pulmonary infiltrates, effusions, weight gain, edema, renal dysfunction.  Check CBC daily.  She is scheduled for bone marrow biopsy on Monday, 08/11/2019.  2. Coagulopathy  PT 19.5 (INR 1.7)  PTT 43.  Fibrinogen 96.  Patient to receive cryoprecipitate 1 pool today.  Follow coagulation studies.  Currently no evidence of bleeding or thrombosis. 3.   Tumor lysis syndrome  Uric acid 13.4 today.  Uric acid 7.0 on 06/23/2019.  Initiate allopurinol- dose discussed with pharmacy given current CrCl.  Anticipate initiation of rasburicase.  Check G6PD assay.  Hydration and serial tumor lysis labs. 4.   Elevated LFTs  AST 303 and ALT 51 on 08/08/2019.  Increased transaminases 51% with [place].  Dora Sims currently on hold. 5.   Infectious disease  Patient denies any fever.  No active infection.  Continue prophylactic posaconazole 100 mg , acyclovir 400 mg and Levaquin 250 mg.   Discuss with nursing. 6.   Renal insufficiency  Creatinine 1.48 (CrCl 19.6 ml/min)   Confirm with pharmacy no adjustment needed in acyclovir and Levaquin. 7.   Code status  DNR.   Lequita Asal, MD  08/09/2019, 7:56 AM

## 2019-08-09 NOTE — Progress Notes (Signed)
3: 28 P.M/ This Probation officer had given patient PRN zofran , patients husband called this Probation officer into room . Patients eyes were rolling backwards and she was Hypertensive , diaphoretic, this Probation officer went and got charge nurse and a rapid was called. Blood that had been being administered was then stopped and Dr,. Posey Pronto was paged, she was not on unit so Dr. Priscella Mann came to room , Husband at bedside and Rapid RN 's , Verbal order for Benadryl 25mg  IV given through PICC SL on left upper arm. Bolus started of NS at 987ml/ hour. Dr. Priscella Mann explained to patients husband the process and why we stopped the blood. Per Dr. Posey Pronto we will hold off on cryo-transfusion at this time, Patient is afebrile. No vomiting noted , only dry heaving. Patient is responsive to verbal stimuli , and tactile stimuli however she is very sleepy d/t benadryl administration. MEWS remains a 2 , Dr. Posey Pronto is aware. Patiebts hu

## 2019-08-09 NOTE — Progress Notes (Signed)
Patient BP was elevated , given PRN zofran , still a little elevated at 169/90 Pulse is 76 o2 99% with 2LPM. States " it feels like the room is spinning"

## 2019-08-10 DIAGNOSIS — E883 Tumor lysis syndrome: Principal | ICD-10-CM

## 2019-08-10 DIAGNOSIS — D65 Disseminated intravascular coagulation [defibrination syndrome]: Secondary | ICD-10-CM

## 2019-08-10 LAB — URINALYSIS, COMPLETE (UACMP) WITH MICROSCOPIC
Bilirubin Urine: NEGATIVE
Glucose, UA: NEGATIVE mg/dL
Ketones, ur: NEGATIVE mg/dL
Leukocytes,Ua: NEGATIVE
Nitrite: NEGATIVE
Protein, ur: 100 mg/dL — AB
Specific Gravity, Urine: 1.019 (ref 1.005–1.030)
pH: 5 (ref 5.0–8.0)

## 2019-08-10 LAB — CBC
HCT: 26.1 % — ABNORMAL LOW (ref 36.0–46.0)
HCT: 24.5 % — ABNORMAL LOW (ref 36.0–46.0)
Hemoglobin: 8.4 g/dL — ABNORMAL LOW (ref 12.0–15.0)
Hemoglobin: 8 g/dL — ABNORMAL LOW (ref 12.0–15.0)
MCH: 28.2 pg (ref 26.0–34.0)
MCH: 27.9 pg (ref 26.0–34.0)
MCHC: 32.2 g/dL (ref 30.0–36.0)
MCHC: 32.7 g/dL (ref 30.0–36.0)
MCV: 87.6 fL (ref 80.0–100.0)
MCV: 85.4 fL (ref 80.0–100.0)
Platelets: 46 10*3/uL — ABNORMAL LOW (ref 150–400)
Platelets: 43 10*3/uL — ABNORMAL LOW (ref 150–400)
RBC: 2.98 MIL/uL — ABNORMAL LOW (ref 3.87–5.11)
RBC: 2.87 MIL/uL — ABNORMAL LOW (ref 3.87–5.11)
RDW: 17.6 % — ABNORMAL HIGH (ref 11.5–15.5)
RDW: 17.6 % — ABNORMAL HIGH (ref 11.5–15.5)
WBC: 65.4 10*3/uL (ref 4.0–10.5)
WBC: 63.2 10*3/uL (ref 4.0–10.5)
nRBC: 6.7 % — ABNORMAL HIGH (ref 0.0–0.2)
nRBC: 7.2 % — ABNORMAL HIGH (ref 0.0–0.2)

## 2019-08-10 LAB — PHOSPHORUS: Phosphorus: 4.7 mg/dL — ABNORMAL HIGH (ref 2.5–4.6)

## 2019-08-10 LAB — TRANSFUSION REACTION
DAT C3: NEGATIVE
Post RXN DAT IgG: NEGATIVE

## 2019-08-10 LAB — PROTIME-INR
INR: 2.7 — ABNORMAL HIGH (ref 0.8–1.2)
Prothrombin Time: 28.2 seconds — ABNORMAL HIGH (ref 11.4–15.2)

## 2019-08-10 LAB — COMPREHENSIVE METABOLIC PANEL
ALT: 50 U/L — ABNORMAL HIGH (ref 0–44)
AST: 356 U/L — ABNORMAL HIGH (ref 15–41)
Albumin: 3 g/dL — ABNORMAL LOW (ref 3.5–5.0)
Alkaline Phosphatase: 133 U/L — ABNORMAL HIGH (ref 38–126)
Anion gap: 13 (ref 5–15)
BUN: 64 mg/dL — ABNORMAL HIGH (ref 8–23)
CO2: 17 mmol/L — ABNORMAL LOW (ref 22–32)
Calcium: 8.1 mg/dL — ABNORMAL LOW (ref 8.9–10.3)
Chloride: 108 mmol/L (ref 98–111)
Creatinine, Ser: 1.59 mg/dL — ABNORMAL HIGH (ref 0.44–1.00)
GFR calc Af Amer: 34 mL/min — ABNORMAL LOW (ref >60)
GFR calc non Af Amer: 30 mL/min — ABNORMAL LOW (ref >60)
Glucose, Bld: 150 mg/dL — ABNORMAL HIGH (ref 70–99)
Potassium: 4 mmol/L (ref 3.5–5.1)
Sodium: 138 mmol/L (ref 135–145)
Total Bilirubin: 0.6 mg/dL (ref 0.3–1.2)
Total Protein: 5.2 g/dL — ABNORMAL LOW (ref 6.5–8.1)

## 2019-08-10 LAB — MAGNESIUM: Magnesium: 1.9 mg/dL (ref 1.7–2.4)

## 2019-08-10 LAB — APTT: aPTT: 41 seconds — ABNORMAL HIGH (ref 24–36)

## 2019-08-10 LAB — RASBURICASE - URIC ACID: Uric Acid, Serum: 13.9 mg/dL — ABNORMAL HIGH (ref 2.5–7.1)

## 2019-08-10 LAB — FIBRINOGEN: Fibrinogen: 60 mg/dL — CL (ref 210–475)

## 2019-08-10 MED ORDER — DEXAMETHASONE SODIUM PHOSPHATE 4 MG/ML IJ SOLN
4.0000 mg | Freq: Two times a day (BID) | INTRAMUSCULAR | Status: DC
  Administered 2019-08-10: 4 mg via INTRAVENOUS
  Filled 2019-08-10 (×3): qty 1

## 2019-08-10 MED ORDER — SODIUM CHLORIDE 0.45 % IV SOLN
INTRAVENOUS | Status: DC
  Filled 2019-08-10 (×3): qty 1000

## 2019-08-10 MED ORDER — SODIUM CHLORIDE 0.9 % IV SOLN
6.0000 mg | Freq: Once | INTRAVENOUS | Status: AC
  Administered 2019-08-10: 6 mg via INTRAVENOUS
  Filled 2019-08-10: qty 4

## 2019-08-10 MED ORDER — SODIUM CHLORIDE 0.9% IV SOLUTION: Freq: Once | INTRAVENOUS | Status: AC

## 2019-08-10 MED ORDER — PROMETHAZINE HCL 25 MG/ML IJ SOLN
6.2500 mg | Freq: Once | INTRAMUSCULAR | Status: AC
  Administered 2019-08-11: 6.25 mg via INTRAVENOUS
  Filled 2019-08-10: qty 1

## 2019-08-10 MED ORDER — LEVOFLOXACIN 500 MG PO TABS: 250.0000 mg | ORAL_TABLET | ORAL | Status: DC

## 2019-08-10 NOTE — Progress Notes (Signed)
[place] [facility] [place] NAME: Kathryn Hahn    MR#:  [id number]  DATE OF BIRTH:  06-12-1934  SUBJECTIVE:  patient came in with generalized weakness no fever patient had an episode of vomiting nausea and elevated blood pressure after starting blood transfusion yesterday.  Places of feeling overall in her throat today. She wants to eat but afraid to swallow. Bladder scan this morning showed 340 mL of urine. sHe is very hard on hearing.  REVIEW OF SYSTEMS:   Review of Systems  Constitutional: Positive for malaise/fatigue. Negative for chills, fever and weight loss.  HENT: Negative for ear discharge, ear pain and nosebleeds.   Eyes: Negative for blurred vision, pain and discharge.  Respiratory: Negative for sputum production, shortness of breath, wheezing and stridor.   Cardiovascular: Negative for chest pain, palpitations, orthopnea and PND.  Gastrointestinal: Negative for abdominal pain, diarrhea, nausea and vomiting.  Genitourinary: Negative for frequency and urgency.  Musculoskeletal: Negative for back pain and joint pain.  Neurological: Positive for weakness. Negative for sensory change, speech change and focal weakness.  Psychiatric/Behavioral: Negative for depression and hallucinations. The patient is not nervous/anxious.    Tolerating Diet:yes Tolerating PT:   DRUG ALLERGIES:   Allergies  Allergen Reactions  . Terbinafine Rash and Swelling    VITALS:  Blood pressure (!) 152/84, pulse 61, temperature 98.7 F (37.1 C), temperature source Oral, resp. rate 18, height 4\' 11"  (1.499 m), weight 47 kg, SpO2 100 %.  PHYSICAL EXAMINATION:   Physical Exam  GENERAL:  86 y.o.-year-old patient lying in the bed with no acute distress. Weak Pallor+ overall appears ill EYES: Pupils equal, round, reactive to light and accommodation. No scleral icterus.   HEENT: Head atraumatic, normocephalic. Oropharynx and nasopharynx clear.  Lips old  blood, dry throat NECK:  Supple, no jugular venous distention. No thyroid enlargement, no tenderness.  LUNGS: Normal breath sounds bilaterally, no wheezing, rales, rhonchi. No use of accessory muscles of respiration.  CARDIOVASCULAR: S1, S2 normal. No murmurs, rubs, or gallops.  ABDOMEN: Soft, nontender, nondistended. Bowel sounds present. No organomegaly or mass.  EXTREMITIES: No cyanosis, clubbing  ++ edema b/l.    NEUROLOGIC: Cranial nerves II through XII are intact. No focal Motor or sensory deficits b/l.   PSYCHIATRIC:  patient is alert and oriented x 3.  SKIN: No obvious rash, lesion, or ulcer.   LABORATORY PANEL:  CBC Recent Labs  Lab 08/10/19 0457  WBC 65.4*  HGB 8.4*  HCT 26.1*  PLT 46*    Chemistries  Recent Labs  Lab 08/10/19 0457  NA 138  K 4.0  CL 108  CO2 17*  GLUCOSE 150*  BUN 64*  CREATININE 1.59*  CALCIUM 8.1*  MG 1.9  AST 356*  ALT 50*  ALKPHOS 133*  BILITOT 0.6   Cardiac Enzymes No results for input(s): TROPONINI in the last 168 hours. RADIOLOGY:  US Abdomen Complete  Result Date: 08/09/2019 CLINICAL DATA:  86 year old female with elevated LFTs and worsening renal function. History of relapsed AML. EXAM: ABDOMEN ULTRASOUND COMPLETE COMPARISON:  02/29/2016 ultrasound FINDINGS: Gallbladder: A 7 mm nonshadowing nonmobile gallstone versus polyp is noted. There is no evidence of gallbladder wall thickening, pericholecystic fluid or sonographic Murphy sign. Common bile duct: Diameter: 5 mm. No intrahepatic or extrahepatic biliary dilatation. Liver: No focal lesion identified. Within normal limits in parenchymal echogenicity. Portal vein is patent on color Doppler imaging with normal direction of blood flow towards the liver. IVC: No abnormality  visualized. Pancreas: Visualized portion unremarkable. Spleen: UPPER limits normal spleen size with a splenic volume of 350 cc. No focal splenic abnormalities are noted. Right Kidney: Length: 10.1 cm. Diffusely  increased renal echogenicity noted without hydronephrosis or mass. No definite renal calculi are noted. Left Kidney: Length: 9.9 cm. Diffusely increased renal echogenicity noted without hydronephrosis or mass. No definite renal calculi are identified. Abdominal aorta: No aneurysm visualized. Other findings: None. IMPRESSION: 1. 7 mm nonshadowing nonmobile gallstone versus gallbladder polyp. No evidence of acute cholecystitis or biliary dilatation. 2. Echogenic kidneys, compatible with medical renal disease. No hydronephrosis. 3. UPPER limits normal spleen size. 4. Unremarkable liver. 5. Trace ascites. Electronically Signed   By: Margarette Canada M.D.   On: 08/09/2019 08:49   ASSESSMENT AND PLAN:  Kathryn Hahn is a 86 y.o. female with medical history significant of relapsed acute myeloid leukemia, hypertension, hyperlipidemia, breast cancer, anemia, thrombocytopenia, who is directly admitted from cancer center per Dr. Rogue Bussing due to worsening liver function and renal function.   Acute myelogenous leukemia  (myelodysplastic/myeloproliferative neoplasms) ([place]):  -Abnormal LFTs: Possibly due to chemotherapy peroncology -resume acyclovir, Noxafil and levaquin--prohylactic dosing -Abdominal ultrasound was ordered by Dr. Rogue Bussing -IV dexamethasone 10 mg bid -pt to get Chemotherapy Vidaza from Monday per Dr Mike Gip  Tumor lysis syndrome with acute DIC given AML with Acute renal failure -IVF - G6PD was done in 11/2018 28.1 -IV rasburicase per Dr Graylon Gunning -started on po allopurinol -transfusion reaction w/u negative -transfuse 1 pool of cryoppt today given low fibrinogen -CBC bid -d/c KCL and Calcium carbonate  -Phosph stable--now on IV NS with bicarb gtt  HTN:  -Continue home medications: Atenolol -hold Hyzaar due to worsening renal function -hydralazine prn  Thrombocytopenia ([place]): Platelet 32--50--46  - No active bleeding. -f/u by CBC  Acute renal failure in the setting of Tumor  lysis syndrome and AML -cont IVF -monitor input/output  -avoid nephrotoxins -Foley  Placed on 08/10/2019 per neurology recommendation to monitor input output.  Patient overall critically ill. Dr. Mike Gip had a family conference with patient patient's husband, niece and patient's brother.  Procedures: Family communication :pt and husband in the room Consults :Oncology, nephrology Discharge Disposition :TBD CODE STATUS: Dnr prior to admission DVT Prophylaxis :SCD due to severe TCP Barriers to discharge: tx for AML  TOTAL TIME TAKING CARE OF THIS PATIENT: *30* minutes.  >50% time spent on counselling and coordination of care  Note: This dictation was prepared with Dragon dictation along with smaller phrase technology. Any transcriptional errors that result from this process are unintentional.  Fritzi Mandes M.D    [facility]   CC: Primary care physician; Rusty Aus, MDPatient ID: Kathryn Hahn, female   DOB: [birth year], 86 y.o.   MRN: [id number]

## 2019-08-10 NOTE — Progress Notes (Signed)
OVERNIGHT Patient initially reported as sleepy contributed to earlier receiving benedryl for previously noted transfusion reaction.  Additional unit of blood transfusion held. Review of labs and current plan.  WBC now above 70,000, and transaminits worsening. Ammonia level added to this am labs   No output noted sing 1800 yesterday. Low urine volume with bladder scan.  BP stable.  Secondary to severity of illness and need for accurate I & O, order for foley catheter placed. Secondary to ongo

## 2019-08-10 NOTE — Consult Note (Signed)
Charlese Loving MRN: [id number] DOB/AGE: 1934-02-20 86 y.o. Primary Care Physician:Miller, Christean Grief, MD Admit date: 08/08/2019 Chief Complaint: No chief complaint on file.  Patient is hard of hearing so the H&P was done by communicating in a written format and with the help of patient husband and medical records   HPI: Ms Taite is a 86 year old Caucasian female with past medical history  of relapsed acute myeloid leukemia, hypertension, hyperlipidemia, breast cancer, anemia, thrombocytopenia, who was  admitted from cancer centerdue to worsening liver function and renal function.  Nephrology was consulted Patient seen today on the first floor Patient main concern was she is having nausea. No complaint of chest pain No cough no shortness of breath No complaint of fever cough or chills No complaint of burning during micturition No complaint of hematuria   Patient husband was present in the room and his main concern was whether patient needs dialysis or not.  I educated patient and her husband about the current kidney related issues.  I answered patient's queries to the best of my ability  Past Medical History:  Diagnosis Date  . Anemia   . Arthritis   . Breast cancer ([place]) 2003   RT LUMPECTOMY  . Edema leg   . History of breast cancer 2003   post lumpectomy  . History of kidney stones   . Hyperlipemia, mixed   . Hypertension, essential   . Leukemia ([place])   . Leukocytosis   . MDS (myelodysplastic syndrome) ([place])   . Personal history of radiation therapy 2003   BREAST CA  . Renal stones   . Vitamin D deficiency         Family History  Problem Relation Age of Onset  . Stroke Mother   . Hypertension Father   . Stroke Father   . Breast cancer Neg Hx     Social History:  reports that she has never smoked. She has never used smokeless tobacco. She reports current alcohol use of about 3.0 standard drinks of alcohol per week. She reports that she does not use  drugs.   Allergies:  Allergies  Allergen Reactions  . Terbinafine Rash and Swelling    Medications Prior to Admission  Medication Sig Dispense Refill  . acyclovir (ZOVIRAX) 400 MG tablet ONE PILL A DAY [TO PREVENT SHINGLES] (Patient taking differently: Take 400 mg by mouth daily at 6 (six) AM. One pill a day [to prevent shingles]) 90 tablet 1  . atenolol (TENORMIN) 50 MG tablet Take 1 tablet by mouth 2 (two) times daily.    . calcium carbonate (OSCAL) 1500 (600 Ca) MG TABS tablet Take 600 mg of elemental calcium by mouth daily with breakfast.    . Cholecalciferol (VITAMIN D) 2000 units tablet Take 1 tablet by mouth daily.    Marland Kitchen KLOR-CON M20 20 MEQ tablet TAKE 1 TABLET BY MOUTH TWICE A DAY (Patient taking differently: Take 20 mEq by mouth 2 (two) times daily. ) 180 tablet 0  . losartan-hydrochlorothiazide (HYZAAR) 50-12.5 MG tablet Take 1 tablet by mouth daily.    . magic mouthwash w/lidocaine SOLN Take 5 mLs by mouth 4 (four) times daily as needed for mouth pain. 480 mL 3  . polyethylene glycol (MIRALAX / GLYCOLAX) 17 g packet Take 17 g by mouth daily.    . posaconazole (NOXAFIL) 100 MG TBEC delayed-release tablet Take 1 tablet (100 mg total) by mouth daily. 30 tablet 3  . acetaminophen (TYLENOL) 325 MG tablet Take 325 mg by mouth every 6 (  six) hours as needed for mild pain or moderate pain.     . heparin lock flush 100 UNIT/ML SOLN injection Inject into the vein.    . Infant Care Products (DERMACLOUD) CREA Apply topically.    . magnesium hydroxide (MILK OF MAGNESIA) 400 MG/5ML suspension Take 5 mLs by mouth every 4 (four) hours as needed.    . ondansetron (ZOFRAN) 8 MG tablet Take by mouth every 8 (eight) hours as needed for nausea or vomiting.    . senna (SENOKOT) 8.6 MG tablet Take 2 tablets by mouth 2 (two) times daily as needed for constipation.          [id number] from the symptoms mentioned above,there are no other symptoms referable to all systems reviewed.  . sodium chloride    Intravenous Once  . acyclovir  400 mg Oral Daily  . allopurinol  50 mg Oral QODAY  . atenolol  50 mg Oral BID  . calcium carbonate  500 mg of elemental calcium Oral Q breakfast  . Chlorhexidine Gluconate Cloth  6 each Topical Daily  . cholecalciferol  2,000 Units Oral Daily  . dexamethasone (DECADRON) injection  10 mg Intravenous Q12H  . dextromethorphan-guaiFENesin  1 tablet Oral BID  . levofloxacin  250 mg Oral Daily  . pantoprazole  40 mg Oral Q1200  . posaconazole  100 mg Oral Daily  . potassium chloride SA  20 mEq Oral BID  . senna  2 tablet Oral BID        Physical Exam: Vital signs in last 24 hours: Temp:  [97.8 F (36.6 C)-98.7 F (37.1 C)] 98.7 F (37.1 C) (02/28 0938) Pulse Rate:  [61-84] 61 (02/28 0938) Resp:  [16-18] 18 (02/28 0938) BP: (129-211)/(74-103) 152/84 (02/28 0938) SpO2:  [99 %-100 %] 100 % (02/28 0938) Weight change:  Last BM Date: 08/06/19  Intake/Output from previous day: 02/27 0701 - 02/28 0700 In: 1122.7 [P.O.:120; I.V.:752.7; Blood:250] Out: -  Total I/O In: 886.2 [P.O.:240; I.V.:646.2] Out: -    Physical Exam: General- pt is awake,alert, oriented to time place and person patient is chronically ill appearing Resp- No acute REsp distress, CTA B/L NO Rhonchi CVS- S1S2 regular ij rate and rhythm GIT- BS+, soft, NT, ND EXT- NO LE Edema, Cyanosis CNS- CN 2-12 grossly intact. Moving all 4 extremities Psych- normal mood and affect    Lab Results: CBC Recent Labs    08/09/19 1708 08/10/19 0457  WBC 76.6* 65.4*  HGB 8.2* 8.4*  HCT 25.7* 26.1*  PLT 50* 46*    BMET Recent Labs    08/09/19 0559 08/10/19 0457  NA 139 138  K 4.0 4.0  CL 107 108  CO2 22 17*  GLUCOSE 147* 150*  BUN 55* 64*  CREATININE 1.40* 1.59*  CALCIUM 8.0* 8.1*   Creat trend 2021 0.7--0.9 baseline ==>1.6 now  Uric acid trend 2021 3.0==>7.0==>13.9  Phos trend 2021 4.0==>4.7   Co2 trend 22=>17  Calcium trend 9.1==>8.1     MICRO Recent  Results (from the past 240 hour(s))  SARS CORONAVIRUS 2 (TAT 6-24 HRS) Nasopharyngeal Nasopharyngeal Swab     Status: None   Collection Time: 08/08/19  1:20 PM   Specimen: Nasopharyngeal Swab  Result Value Ref Range Status   SARS Coronavirus 2 NEGATIVE NEGATIVE Final    Comment: (NOTE) SARS-CoV-2 target nucleic acids are NOT DETECTED. The SARS-CoV-2 RNA is generally detectable in upper and lower respiratory specimens during the acute phase of infection. Negative results do not preclude SARS-CoV-2 infection,  do not rule out co-infections with other pathogens, and should not be used as the sole basis for treatment or other patient management decisions. Negative results must be combined with clinical observations, patient history, and epidemiological information. The expected result is Negative. Fact Sheet for Patients: SugarRoll.be Fact Sheet for Healthcare Providers: https://www.woods-mathews.com/ This test is not yet approved or cleared by the [place] FDA and  has been authorized for detection and/or diagnosis of SARS-CoV-2 by FDA under an Emergency Use Authorization (EUA). This EUA will remain  in effect (meaning this test can be used) for the duration of the COVID-19 declaration under Section 56 4(b)(1) of the Act, 21 U.S.C. section 360bbb-3(b)(1), unless the authorization is terminated or revoked sooner. Performed at [facility], [place] [address], [place], [place] [address]       Lab Results  Component Value Date   CALCIUM 8.1 (L) 08/10/2019   PHOS 4.7 (H) 08/10/2019   Albumin 3.0 Corrected calcium 8.9   Calcium phosphorus product is 42   Impression: 1)Renal  AKI secondary to multiple etiologies Tumor lysis syndrome AKI-oliguric?  AKI secondary to ATN Patient had decreased p.o. intake and was on RAS blockers as an outpatient  AKI secondary to obstructive uropathy Patient had more than 300 mL in bladder  scan     2)HTN  stable Medication- On Beta blockers    3)Anemia HGb stable Anemia sec to  AML  4)Thrombocyopenia Sec to AML  5)Abnormal LFts Primary team following   6)Electrolytes Normokalemic NOrmonatremic   7)Acid base Co2 not at goal We'll give IV bicarb  8) Tumor lysis syndrome As per Craig-Bishop definition Pt has TLL-data in favor Rise in uric acid Rise in Creat Fall in calcium   Pt has grade 1 in severity Only factor rise in creat  NO cardiac/CNS issues    Will suggest to give rasburicase IV half-normal saline with 75 meq of bicarb  Plan:   I checked  Phos today If phos not high and calcium phosphorus product is less than 55-will start IV 1/2 Ns w 75 meq Bicarb   Will suggest Rasburicase as was not given uptill now awaiting g6pd   It was done in 2020 Collected: 11/25/18 0904  Result status: Final  Resulting lab: [place]  Reference range: 4.6 - 13.5 U/g Hb  Value: 28.1High      Will DC KCl in an effort to avoid hyperkalemia Will DC calcium carbonate in an effort to avoid hypercalcemia  Will we'll follow strict I's and O's We'll suggest Foley in an effort to follow I's and O's  Thanks for allowing [place] to participate in patient care  Violette Morneault s [facility] 08/10/2019, 1:21 PM

## 2019-08-10 NOTE — Progress Notes (Signed)
CRITICAL VALUE STICKER  CRITICAL VALUE: Fibrinogen 60  RECEIVER (on-site recipient of call): Levada Schilling, RN  DATE & TIME NOTIFIED: [phone] 08/10/19  MESSENGER (representative from lab): Tommi Rumps  MD NOTIFIED: Dr. Posey Pronto  TIME OF NOTIFICATION: [id number] 08/10/19  RESPONSE: cryo.

## 2019-08-10 NOTE — Progress Notes (Signed)
[facility] Hematology/Oncology Progress Note  Date of admission: 08/08/2019  Hospital day:  08/10/2019  Chief Complaint: Kathryn Hahn is a 86 y.o. 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  Subjective:  Patient is fatigued and nauseous.  She can not hear as she does not have her hearing aids.   Social History:  Patient is accompanied by her husband today as well as her brother and niece (physicians) on the phone.  Allergies:  Allergies  Allergen Reactions  . Terbinafine Rash and Swelling    Scheduled Medications: . acyclovir  400 mg Oral Daily  . allopurinol  50 mg Oral QODAY  . atenolol  50 mg Oral BID  . calcium carbonate  500 mg of elemental calcium Oral Q breakfast  . Chlorhexidine Gluconate Cloth  6 each Topical Daily  . cholecalciferol  2,000 Units Oral Daily  . dexamethasone (DECADRON) injection  10 mg Intravenous Q12H  . dextromethorphan-guaiFENesin  1 tablet Oral BID  . levofloxacin  250 mg Oral Daily  . pantoprazole  40 mg Oral Q1200  . posaconazole  100 mg Oral Daily  . potassium chloride SA  20 mEq Oral BID  . senna  2 tablet Oral BID    Review of Systems  Constitutional: Positive for malaise/fatigue. Negative for chills, diaphoresis (associated with transfusion reaction yesterday) and fever.  HENT: Positive for hearing loss (unable to hear today without hearing aids) and sore throat. Negative for congestion, ear pain, nosebleeds, sinus pain and tinnitus.   Eyes: Negative.  Negative for blurred vision and double vision.  Respiratory: Positive for shortness of breath. Negative for cough, hemoptysis, sputum production and wheezing.   Cardiovascular: Negative.  Negative for chest pain, orthopnea, leg swelling and PND.  Gastrointestinal: Positive for heartburn (in the AM), nausea and vomiting. Negative for abdominal pain, blood  in stool, constipation, diarrhea (depends on what she eats) and melena.       Diminished appetite.  Eats small amounts slowly.  Genitourinary: Negative.  Negative for dysuria, frequency and urgency.  Musculoskeletal: Negative.  Negative for back pain, joint pain, myalgias and neck pain.  Skin: Negative for itching and rash.       Extensive bruising (chronic).  Neurological: Positive for dizziness and weakness (general). Negative for tingling, tremors, sensory change, speech change, focal weakness and headaches.  Endo/Heme/Allergies: Bruises/bleeds easily.  Psychiatric/Behavioral: Negative.  Negative for depression. The patient is not nervous/anxious and does not have insomnia.     Vitals: Blood pressure (!) 145/74, pulse 70, temperature 98.3 F (36.8 C), temperature source Oral, resp. rate 16, height _0  (1.499 m), weight 103 lb 9.6 oz (47 kg), SpO2 100 %.   Physical Exam  Constitutional:  Thin chronically ill appearing pale woman sitting up in bed with an emesis bag and her eyes closed.  HENT:  Head: Normocephalic and atraumatic.  Mouth/Throat: No oropharyngeal exudate.  Short white hair.  Lower lip with dried blood.  Small hematoma on tip of tongue.  Dry mouth.  Eyes: Pupils are equal, round, and reactive to light. Conjunctivae and EOM are normal. No scleral icterus.  Blue eyes.  Neck: No JVD present.  Cardiovascular: Normal rate, normal heart sounds and intact distal pulses.  No murmur heard. Left upper extremity PICC line.  Pulmonary/Chest: Effort normal and breath sounds normal. No respiratory distress. She has no wheezes. She has no rales.  Clear to auscultation anteriorly.  Abdominal: Bowel sounds are normal. She exhibits no distension and no mass. There is no abdominal tenderness. There is no rebound and no guarding.  Musculoskeletal:        General: No tenderness or edema.     Cervical back: Normal range of motion.     Comments: Lower extremity ICDs in place.   Lymphadenopathy:    She has no cervical adenopathy.       Right: No supraclavicular adenopathy present.       Left: No supraclavicular adenopathy present.  Neurological: She is alert.  Skin: Skin is warm and dry. She is not diaphoretic. No erythema. There is pallor.  Upper extremity and chest ecchymosis.  Petechiae.  Psychiatric: Mood, memory, affect and judgment normal.  Nursing note and vitals reviewed.    Results for orders placed or performed during the hospital encounter of 08/08/19 (from the past 48 hour(s))  Type and screen [place]     Status: None (Preliminary result)   Collection Time: 08/08/19  1:13 PM  Result Value Ref Range   ABO/RH(D) O POS    Antibody Screen NEG    Sample Expiration      08/11/2019,2359 Performed at [facility], [address], [address], [place] [address]    Unit Number [id number]    Blood Component Type RBC, LR IRR    Unit division 00    Status of Unit ALLOCATED    Transfusion Status OK TO TRANSFUSE    Crossmatch Result Compatible   Protime-INR     Status: Abnormal   Collection Time: 08/08/19  1:13 PM  Result Value Ref Range   Prothrombin Time 19.8 (H) 11.4 - 15.2 seconds   INR 1.7 (H) 0.8 - 1.2    Comment: (NOTE) INR goal varies based on device and disease states. Performed at [facility], [address] [place], [place] [address]   APTT     Status: Abnormal   Collection Time: 08/08/19  1:13 PM  Result Value Ref Range   aPTT 44 (H) 24 - 36 seconds    Comment:        IF BASELINE aPTT IS ELEVATED, SUGGEST PATIENT RISK ASSESSMENT BE USED TO DETERMINE APPROPRIATE ANTICOAGULANT THERAPY. Performed at [facility], [place], [place] [address]   SARS CORONAVIRUS 2 (TAT 6-24 HRS) Nasopharyngeal Nasopharyngeal Swab     Status: None   Collection Time: 08/08/19  1:20 PM   Specimen: Nasopharyngeal Swab  Result Value Ref Range   SARS Coronavirus 2 NEGATIVE NEGATIVE     Comment: (NOTE) SARS-CoV-2 target nucleic acids are NOT DETECTED. The SARS-CoV-2 RNA is generally detectable in upper and lower respiratory specimens during the acute phase of infection. Negative results do not preclude SARS-CoV-2 infection, do not rule out co-infections with other pathogens, and should not be used as the sole basis for treatment or other patient management decisions. Negative results must be combined with clinical observations, patient history, and epidemiological information. The expected result is Negative. Fact Sheet for Patients: SugarRoll.be Fact Sheet for Healthcare Providers: https://www.woods-mathews.com/ This test is not yet approved or cleared by the [place] FDA and  has been authorized for detection and/or diagnosis of SARS-CoV-2 by FDA under an Emergency Use Authorization (EUA). This EUA will remain  in effect (meaning this test can be used) for the duration of the COVID-19 declaration under Section 56 4(b)(1) of the Act, 21 U.S.C. section 360bbb-3(b)(1), unless the authorization is terminated or revoked sooner. Performed  at [facility], [place] [address], [place], [place] [address]   Lipase, blood     Status: Abnormal   Collection Time: 08/09/19  5:59 AM  Result Value Ref Range   Lipase 86 (H) 11 - 51 U/L    Comment: Performed at [facility], [address] [place], [place] [address]  Amylase     Status: Abnormal   Collection Time: 08/09/19  5:59 AM  Result Value Ref Range   Amylase 126 (H) 28 - 100 U/L    Comment: Performed at [facility], [address] [place], [place] [address]  Comprehensive metabolic panel     Status: Abnormal   Collection Time: 08/09/19  5:59 AM  Result Value Ref Range   Sodium 139 135 - 145 mmol/L   Potassium 4.0 3.5 - 5.1 mmol/L   Chloride 107 98 - 111 mmol/L   CO2 22 22 - 32 mmol/L   Glucose, Bld 147 (H) 70 - 99 mg/dL    Comment: Glucose  reference range applies only to samples taken after fasting for at least 8 hours.   BUN 55 (H) 8 - 23 mg/dL   Creatinine, Ser 1.40 (H) 0.44 - 1.00 mg/dL   Calcium 8.0 (L) 8.9 - 10.3 mg/dL   Total Protein 5.0 (L) 6.5 - 8.1 g/dL   Albumin 2.7 (L) 3.5 - 5.0 g/dL   AST 227 (H) 15 - 41 U/L   ALT 47 (H) 0 - 44 U/L   Alkaline Phosphatase 95 38 - 126 U/L   Total Bilirubin 0.7 0.3 - 1.2 mg/dL   GFR calc non Af Amer 35 (L) >60 mL/min   GFR calc Af Amer 40 (L) >60 mL/min   Anion gap 10 5 - 15    Comment: Performed at [facility], [address] [place], [place] [address]  Protime-INR     Status: Abnormal   Collection Time: 08/09/19  5:59 AM  Result Value Ref Range   Prothrombin Time 19.5 (H) 11.4 - 15.2 seconds   INR 1.7 (H) 0.8 - 1.2    Comment: (NOTE) INR goal varies based on device and disease states. Performed at [facility], [address] [place], [place] [address]   APTT     Status: Abnormal   Collection Time: 08/09/19  5:59 AM  Result Value Ref Range   aPTT 43 (H) 24 - 36 seconds    Comment:        IF BASELINE aPTT IS ELEVATED, SUGGEST PATIENT RISK ASSESSMENT BE USED TO DETERMINE APPROPRIATE ANTICOAGULANT THERAPY. Performed at [facility], [address] [place] [employer] [place] [employer], [place] [address]   Fibrinogen     Status: Abnormal   Collection Time: 08/09/19  5:59 AM  Result Value Ref Range   Fibrinogen 96 (LL) 210 - 475 mg/dL    Comment: CRITICAL RESULT CALLED TO, READ BACK BY AND VERIFIED WITH: [facility] FUENTES AT 4665 08/09/19.PMF Performed at [facility], [place], [place] [address]   Lactate dehydrogenase     Status: Abnormal   Collection Time: 08/09/19  5:59 AM  Result Value Ref Range   LDH 6,101 (H) 98 - 192 U/L    Comment: RESULT CONFIRMED BY MANUAL DILUTION. QSD Performed at [facility], [address] [place], [place] [address]   Uric acid     Status: Abnormal   Collection Time: 08/09/19  5:59 AM  Result  Value Ref Range   Uric Acid, Serum 13.4 (  H) 2.5 - 7.1 mg/dL    Comment: Performed at [facility], [address] [place], [place] [address]  Phosphorus     Status: None   Collection Time: 08/09/19  5:59 AM  Result Value Ref Range   Phosphorus 4.6 2.5 - 4.6 mg/dL    Comment: Performed at [facility], [address] [place], [place] [address]  Magnesium     Status: None   Collection Time: 08/09/19  5:59 AM  Result Value Ref Range   Magnesium 1.8 1.7 - 2.4 mg/dL    Comment: Performed at [facility], [address] [place], [place] [address]  CBC with Differential/Platelet     Status: Abnormal   Collection Time: 08/09/19  5:59 AM  Result Value Ref Range   WBC 39.9 (H) 4.0 - 10.5 K/uL   RBC 2.62 (L) 3.87 - 5.11 MIL/uL   Hemoglobin 7.3 (L) 12.0 - 15.0 g/dL   HCT 22.8 (L) 36.0 - 46.0 %   MCV 87.0 80.0 - 100.0 fL   MCH 27.9 26.0 - 34.0 pg   MCHC 32.0 30.0 - 36.0 g/dL   RDW 17.6 (H) 11.5 - 15.5 %   Platelets 22 (LL) 150 - 400 K/uL    Comment: REPEATED TO VERIFY PLATELET COUNT CONFIRMED BY SMEAR Immature Platelet Fraction may be clinically indicated, consider ordering this additional test [id number] THIS CRITICAL RESULT HAS VERIFIED AND BEEN CALLED TO LAUREN LAND BY PAULA MARY FERREE ON 02 27 2021 AT 0754, AND HAS BEEN READ BACK.     nRBC 2.9 (H) 0.0 - 0.2 %   Neutrophils Relative % 12 %   Neutro Abs 5.6 1.7 - 7.7 K/uL   Band Neutrophils 2 %   Lymphocytes Relative 34 %   Lymphs Abs 13.6 (H) 0.7 - 4.0 K/uL   Monocytes Relative 39 %   Monocytes Absolute 15.6 (H) 0.1 - 1.0 K/uL   Eosinophils Relative 0 %   Eosinophils Absolute 0.0 0.0 - 0.5 K/uL   Basophils Relative 0 %   Basophils Absolute 0.0 0.0 - 0.1 K/uL   WBC Morphology SMUDGE CELLS    Smear Review PLATELETS APPEAR DECREASED    Other 8 %   nRBC 6 (H) 0 /100 WBC   Metamyelocytes Relative 2 %   Myelocytes 3 %   Abs Immature Granulocytes 2.00 (H) 0.00 - 0.07 K/uL   Schistocytes PRESENT     Tear Drop Cells PRESENT    Polychromasia PRESENT     Comment: Performed at [facility], [address], [place], [place] [address]  Prepare RBC     Status: None   Collection Time: 08/09/19 10:53 AM  Result Value Ref Range   Order Confirmation      ORDER PROCESSED BY BLOOD BANK Performed at [facility], [address], [place], [place] [address]   Prepare cryoprecipitate     Status: None (Preliminary result)   Collection Time: 08/09/19 11:51 AM  Result Value Ref Range   Unit Number [id number]    Blood Component Type CRYPOOL THAW    Unit division 00    Status of Unit ALLOCATED    Transfusion Status OK TO TRANSFUSE   Glucose, capillary     Status: Abnormal   Collection Time: 08/09/19  3:33 PM  Result Value Ref Range   Glucose-Capillary 169 (H) 70 - 99 mg/dL    Comment: Glucose reference range applies only to samples taken after fasting for at least 8 hours.  Transfusion  reaction     Status: None   Collection Time: 08/09/19  4:43 PM  Result Value Ref Range   Post RXN DAT IgG NEG    DAT C3 NEG    Path interp tx rxn      C/ LAUREN LAND ON 08/09/2019 AT 1719 PATIENT EXPERIENCING NAUSEA AND HIGH BLOOD PRESSURE TIK.   C/ DR. PATRICK ON 08/09/2019 AT 1822 NO LABORATORY EVIDENCE OF HEMOLYTIC TRANSFUSION REACTION. IF TRANSFUSION REACTION CAUSED PATIENT'S SYMPTOMS, REACTION  MOST LIKELY DUE TO CIRCULATORY OVERLOAD TIK. Performed at [facility], [address] [place], [place] [address]   CBC     Status: Abnormal   Collection Time: 08/09/19  5:08 PM  Result Value Ref Range   WBC 76.6 (HH) 4.0 - 10.5 K/uL    Comment: This critical result has verified and been called to [facility] by Alfredia Ferguson on 02 27 2021 at 2024, and has been read back.    RBC 2.93 (L) 3.87 - 5.11 MIL/uL   Hemoglobin 8.2 (L) 12.0 - 15.0 g/dL   HCT 25.7 (L) 36.0 - 46.0 %   MCV 87.7 80.0 - 100.0 fL   MCH 28.0 26.0 - 34.0 pg   MCHC 31.9 30.0 - 36.0 g/dL   RDW 17.2 (H)  11.5 - 15.5 %   Platelets 50 (L) 150 - 400 K/uL    Comment: REPEATED TO VERIFY Immature Platelet Fraction may be clinically indicated, consider ordering this additional test [id number] CONSISTENT WITH PREVIOUS RESULT    nRBC 4.0 (H) 0.0 - 0.2 %    Comment: Performed at [facility], [address] [place], [place] [address]  Urinalysis, Complete w Microscopic     Status: Abnormal   Collection Time: 08/09/19 11:57 PM  Result Value Ref Range   Color, Urine AMBER (A) YELLOW    Comment: BIOCHEMICALS MAY BE AFFECTED BY COLOR   APPearance CLOUDY (A) CLEAR   Specific Gravity, Urine 1.019 1.005 - 1.030   pH 5.0 5.0 - 8.0   Glucose, UA NEGATIVE NEGATIVE mg/dL   Hgb urine dipstick LARGE (A) NEGATIVE   Bilirubin Urine NEGATIVE NEGATIVE   Ketones, ur NEGATIVE NEGATIVE mg/dL   Protein, ur 100 (A) NEGATIVE mg/dL   Nitrite NEGATIVE NEGATIVE   Leukocytes,Ua NEGATIVE NEGATIVE   RBC / HPF 11-20 0 - 5 RBC/hpf   WBC, UA 0-5 0 - 5 WBC/hpf   Bacteria, UA RARE (A) NONE SEEN   Squamous Epithelial / LPF 0-5 0 - 5   Mucus PRESENT     Comment: Performed at [facility], [address] [place], [place] [address]  Comprehensive metabolic panel     Status: Abnormal   Collection Time: 08/10/19  4:57 AM  Result Value Ref Range   Sodium 138 135 - 145 mmol/L   Potassium 4.0 3.5 - 5.1 mmol/L   Chloride 108 98 - 111 mmol/L   CO2 17 (L) 22 - 32 mmol/L   Glucose, Bld 150 (H) 70 - 99 mg/dL    Comment: Glucose reference range applies only to samples taken after fasting for at least 8 hours.   BUN 64 (H) 8 - 23 mg/dL   Creatinine, Ser 1.59 (H) 0.44 - 1.00 mg/dL   Calcium 8.1 (L) 8.9 - 10.3 mg/dL   Total Protein 5.2 (L) 6.5 - 8.1 g/dL   Albumin 3.0 (L) 3.5 - 5.0 g/dL   AST 356 (H) 15 - 41 U/L   ALT 50 (H) 0 - 44 U/L  Alkaline Phosphatase 133 (H) 38 - 126 U/L   Total Bilirubin 0.6 0.3 - 1.2 mg/dL   GFR calc non Af Amer 30 (L) >60 mL/min   GFR calc Af Amer 34 (L) >60 mL/min   Anion gap  13 5 - 15    Comment: Performed at [facility], [place], [place] [address]  Rasburicase - Uric Acid (special collection / handling)     Status: Abnormal   Collection Time: 08/10/19  4:57 AM  Result Value Ref Range   Uric Acid, Serum 13.9 (H) 2.5 - 7.1 mg/dL    Comment: Performed at [facility], [address] [place], [place] [address]   US Abdomen Complete  Result Date: 08/09/2019 CLINICAL DATA:  86 year old female with elevated LFTs and worsening renal function. History of relapsed AML. EXAM: ABDOMEN ULTRASOUND COMPLETE COMPARISON:  02/29/2016 ultrasound FINDINGS: Gallbladder: A 7 mm nonshadowing nonmobile gallstone versus polyp is noted. There is no evidence of gallbladder wall thickening, pericholecystic fluid or sonographic Murphy sign. Common bile duct: Diameter: 5 mm. No intrahepatic or extrahepatic biliary dilatation. Liver: No focal lesion identified. Within normal limits in parenchymal echogenicity. Portal vein is patent on color Doppler imaging with normal direction of blood flow towards the liver. IVC: No abnormality visualized. Pancreas: Visualized portion unremarkable. Spleen: UPPER limits normal spleen size with a splenic volume of 350 cc. No focal splenic abnormalities are noted. Right Kidney: Length: 10.1 cm. Diffusely increased renal echogenicity noted without hydronephrosis or mass. No definite renal calculi are noted. Left Kidney: Length: 9.9 cm. Diffusely increased renal echogenicity noted without hydronephrosis or mass. No definite renal calculi are identified. Abdominal aorta: No aneurysm visualized. Other findings: None. IMPRESSION: 1. 7 mm nonshadowing nonmobile gallstone versus gallbladder polyp. No evidence of acute cholecystitis or biliary dilatation. 2. Echogenic kidneys, compatible with medical renal disease. No hydronephrosis. 3. UPPER limits normal spleen size. 4. Unremarkable liver. 5. Trace ascites. Electronically Signed   By: Margarette Canada M.D.   On: 08/09/2019 08:49    Assessment:  Tylin Wiles is a 86 y.o. female with relapsed AML with monocytic differentiation admitted with concern for progressive disease vs differentiation syndrome.  Bone marrow on 05/02/2019 revealed 50 to 60% involvement-NGS-FLT3 POSITIVE.  Bone marrow on 06/27/2019 revealed  27% blasts-partial response. currently on She has been on gilteritinib Dora Sims).  She has anemia and thrombocytopenia secondary to underlying disease/marrow involvement.  She has elevated LFTs felt secondary to progressive AML.  Symptomatically,  Plan: 1. Acute myelogenous leukemia  Hematocrit 26.0.  Hemoglobin 8.0.  MCV 87.2.  Platelets 32,000.  WBC 37,200 with 17% blasts on 08/08/2019.  Hematocrit 22.8.  Hemoglobin 7.3.  MCV 87.0.  Platelets 22,000.  WBC 39,900 on 08/09/2019 (6 am).  Hematocrit 25.7.  Hemoglobin 8.2.  MCV 87.7.  Platelets 50,000.  WBC 76,600 on 08/09/2019 (5 pm).  Hematocrit 26.1.  Hemoglobin 8.4.  MCV 87.6.  Platelets 46,000.  WBC 65,400 on 08/10/2019 (5 am).  Suspect patient has progressive AML.   WBC has rapidly increased with evidence of tumor lysis syndrome.    WBC 37,200 to 39,900 to 76,600 now 65,400.   LDH 2394 on 07/22/2019, 4038 on 08/05/2019, and 6657 on 08/08/2019.   Patient scheduled for bone marrow biopsy on 08/11/2019.    Patient's husband would like to postpone.   Vidaza will be initiated on 08/11/2019- pharmacy aware  Patient currently receiving Decadron 10 mg IV q 12 hours for possible differentiation syndrome.   Differentiation syndrome: fever,  shortness of breath, pulmonary infiltrates, effusions, weight gain, edema, renal dysfunction.  Maintain hemoglobin > 8.  Maintain platelets > 15,000.   Current counts are stable without need for transfusion.   Patient receives premeds of Tylenol alone as Benadryl makes her "woozy".   Maintain active type and screen.  Check CBC twice daily  2. Coagulopathy  PT 19.5 (INR 1.7)  PTT 43.   Fibrinogen 96 on 08/09/2019.  Repeat coagulation studies are pending.  Patient did not receive cryoprecipitate yesterday.  Currently no evidence of active bleeding or thrombosis.  Check coags daily in AM. 3.   Possible transfusion reaction  During transfusion of PRBCs yesterday, patient developed diaphoresis, hypertension (211/103), and decreased responsiveness.   Transfusion stopped.  She received Benadryl.  Transfusion reaction work-up initiated.  Work-up revealed no evidence of hemolytic reaction. 4.   Tumor lysis syndrome  Uric acid 13.4 on 08/09/2019 and 13.9 on 08/10/2019.  Allopurinol started yesterday with hydration 75 cc/hr.  Plan for rasburicase.  G6PD assay is pending.  Hydration and serial AM tumor lysis labs. 5.   Renal insufficiency  Creatinine 1.0 on 07/29/2019 , 1.21 on 08/05/2019, 1.48 on 08/08/2019, and 1.59 on 08/10/2019.   Renal ultrasound on 08/09/2019 revealed no hydronephrosis.  Urine output unclear as patient had not voided since 6pm yesterday, but bladder scan had 334 cc and voided in bed.  Plan for I/O catheter then external catheter.  Daily weights.  Patient's husband would like to talk to nephrology regarding possible short term dialysis if needed. 6.   Elevated LFTs  AST 303 and ALT 51 on 08/08/2019.  AST 356 and ALT 50 on 08/10/2019.  Indcidence of increased transaminases associated with X[place] 51%.   Dora Sims currently on hold.  Suspect etiology is secondary to relapsed AML. 7.   Infectious disease  Patient denies any fever.  No active infection.  Continue prophylactic posaconazole 100 mg , acyclovir 400 mg and Levaquin 250 mg. 8.   Code status  DNR.  Today, I had a family conference with the patient present.  Her husband, brother and niece were participants.  The family was updated regarding her critical condition with relapsed AML.  Multiple questions were asked and answered.  40 minutes were spent with the family.   Lequita Asal, MD   08/10/2019, 9:25 AM

## 2019-08-10 NOTE — Progress Notes (Signed)
PHARMACY NOTE:  ANTIMICROBIAL RENAL DOSAGE ADJUSTMENT  Current antimicrobial regimen includes a mismatch between antimicrobial dosage and estimated renal function.  As per policy approved by the Pharmacy & Therapeutics and Medical Executive Committees, the antimicrobial dosage will be adjusted accordingly.  Current antimicrobial dosage:  Levofloxacin 250 mg q24h  Indication: prophylaxis  Renal Function:  Estimated Creatinine Clearance: 18.3 mL/min (A) (by C-G formula based on SCr of 1.59 mg/dL (H)).     Antimicrobial dosage has been changed to:  Levofloxacin 250 mg q48h   Thank you for allowing pharmacy to be a part of this patient's care.  Dorena Bodo, PharmD 08/10/2019 3:05 PM

## 2019-08-10 NOTE — Progress Notes (Signed)
Patient has not voided since 1800 on 2/27, bladder scan showed 334cc's. Notified on call provider Sharion Settler NP. Provider placed order for foley catheter.

## 2019-08-10 NOTE — Progress Notes (Signed)
Informed Dr. Posey Pronto of wbc 65.4 face to face.

## 2019-08-10 NOTE — Progress Notes (Signed)
U/A sent to lab, patient having dry heaves, c/o nausea. Administered prn zofran. Updated on call provider Sharion Settler NP as well. VS WNL.

## 2019-08-10 NOTE — Plan of Care (Signed)

## 2019-08-11 ENCOUNTER — Telehealth: Payer: Self-pay | Admitting: Internal Medicine

## 2019-08-11 ENCOUNTER — Other Ambulatory Visit: Payer: Self-pay | Admitting: Internal Medicine

## 2019-08-11 DIAGNOSIS — Z515 Encounter for palliative care: Secondary | ICD-10-CM

## 2019-08-11 LAB — PREPARE CRYOPRECIPITATE
Unit division: 0
Unit division: 0

## 2019-08-11 LAB — TYPE AND SCREEN
ABO/RH(D): O POS
Antibody Screen: NEGATIVE

## 2019-08-11 LAB — CBC
HCT: 21 % — ABNORMAL LOW (ref 36.0–46.0)
Hemoglobin: 6.9 g/dL — ABNORMAL LOW (ref 12.0–15.0)
MCH: 28.3 pg (ref 26.0–34.0)
MCHC: 32.9 g/dL (ref 30.0–36.0)
MCV: 86.1 fL (ref 80.0–100.0)
Platelets: 67 10*3/uL — ABNORMAL LOW (ref 150–400)
RBC: 2.44 MIL/uL — ABNORMAL LOW (ref 3.87–5.11)
RDW: 18.2 % — ABNORMAL HIGH (ref 11.5–15.5)
WBC: 93.4 10*3/uL (ref 4.0–10.5)
nRBC: 6.1 % — ABNORMAL HIGH (ref 0.0–0.2)

## 2019-08-11 LAB — RASBURICASE - URIC ACID: Uric Acid, Serum: 6.3 mg/dL (ref 2.5–7.1)

## 2019-08-11 LAB — BPAM CRYOPRECIPITATE
Blood Product Expiration Date: 202102271829
Blood Product Expiration Date: 202102281905
ISSUE DATE / TIME: 202102281406
Unit Type and Rh: 6200
Unit Type and Rh: 5100

## 2019-08-11 LAB — COMPREHENSIVE METABOLIC PANEL
ALT: 50 U/L — ABNORMAL HIGH (ref 0–44)
AST: 423 U/L — ABNORMAL HIGH (ref 15–41)
Albumin: 3.1 g/dL — ABNORMAL LOW (ref 3.5–5.0)
Alkaline Phosphatase: 134 U/L — ABNORMAL HIGH (ref 38–126)
Anion gap: 10 (ref 5–15)
BUN: 73 mg/dL — ABNORMAL HIGH (ref 8–23)
CO2: 24 mmol/L (ref 22–32)
Calcium: 7.7 mg/dL — ABNORMAL LOW (ref 8.9–10.3)
Chloride: 108 mmol/L (ref 98–111)
Creatinine, Ser: 1.68 mg/dL — ABNORMAL HIGH (ref 0.44–1.00)
GFR calc Af Amer: 32 mL/min — ABNORMAL LOW (ref >60)
GFR calc non Af Amer: 28 mL/min — ABNORMAL LOW (ref >60)
Glucose, Bld: 135 mg/dL — ABNORMAL HIGH (ref 70–99)
Potassium: 4 mmol/L (ref 3.5–5.1)
Sodium: 142 mmol/L (ref 135–145)
Total Bilirubin: 0.7 mg/dL (ref 0.3–1.2)
Total Protein: 5.1 g/dL — ABNORMAL LOW (ref 6.5–8.1)

## 2019-08-11 LAB — FIBRINOGEN: Fibrinogen: 60 mg/dL — CL (ref 210–475)

## 2019-08-11 LAB — PROTIME-INR
INR: 2.8 — ABNORMAL HIGH (ref 0.8–1.2)
Prothrombin Time: 29.8 seconds — ABNORMAL HIGH (ref 11.4–15.2)

## 2019-08-11 LAB — GLUCOSE 6 PHOSPHATE DEHYDROGENASE
G6PDH: 21.2 U/g{Hb} — ABNORMAL HIGH (ref 4.8–15.7)
Hemoglobin: 8.3 g/dL — ABNORMAL LOW (ref 11.1–15.9)

## 2019-08-11 LAB — APTT: aPTT: 43 seconds — ABNORMAL HIGH (ref 24–36)

## 2019-08-11 LAB — PATHOLOGIST SMEAR REVIEW

## 2019-08-11 MED ORDER — MORPHINE SULFATE 2 MG/ML IJ SOLN
2.0000 mg | INTRAMUSCULAR | Status: DC | PRN
  Administered 2019-08-12: 2 mg via INTRAVENOUS
  Filled 2019-08-11: qty 1

## 2019-08-11 MED ORDER — MORPHINE SULFATE (CONCENTRATE) 10 MG/0.5ML PO SOLN: 10.0000 mg | ORAL | Status: DC | PRN

## 2019-08-11 MED ORDER — LORAZEPAM 2 MG/ML IJ SOLN: 0.5000 mg | INTRAMUSCULAR | Status: DC | PRN

## 2019-08-11 NOTE — Progress Notes (Signed)
Patient ID: Kathryn Hahn, female   DOB: [birth year], 86 y.o.   MRN: [id number]  Dr Rogue Bussing had discussion with husband and family. Pt is not comfort care. Interested in [facility] home. CSW referral for hospice home. D/w Billey Chang NP, palliative medicine RN [place]ra aware

## 2019-08-11 NOTE — Care Management Important Message (Signed)
Important Message  Patient Details  Name: Kathryn Hahn MRN: [id number] Date of Birth: 1934-05-23   Medicare Important Message Given:  Yes     Juliann Pulse A Jimya Ciani 08/11/2019, 10:28 AM

## 2019-08-11 NOTE — NC FL2 (Signed)
[place] LEVEL OF CARE SCREENING TOOL     IDENTIFICATION  Patient Name: Kathryn Hahn Birthdate: 26-Sep-1933 Sex: female Admission Date (Current Location): 08/08/2019  [place] and [place] Number:  Engineering geologist and Address:  [facility], [address] [place], [place] [address]      Provider Number: [id number]  Attending Physician Name and Address:  Fritzi Mandes, MD  Relative Name and Phone Number:  Percell Miller husband [phone]    Current Level of Care: Hospital Recommended Level of Care: [place] Prior Approval Number:    Date Approved/Denied:   PASRR Number: [id number]  Discharge Plan: SNF    Current Diagnoses: Patient Active Problem List   Diagnosis Date Noted  . Palliative care encounter   . AKI (acute kidney injury) ([place]) 08/08/2019  . Abnormal LFTs 08/08/2019  . GERD (gastroesophageal reflux disease) 08/08/2019  . Moderate protein-calorie malnutrition ([place]) 03/18/2019  . Closed fracture of proximal end of right humerus 01/24/2019  . Closed fracture of multiple ribs of right side with routine healing 01/15/2019  . Subarachnoid hemorrhage following injury, no loss of consciousness ([place])   . Symptomatic anemia   . Thrombocytopenia ([place])   . Chemotherapy-induced neutropenia ([place])   . SAH (subarachnoid hemorrhage) ([place]) 01/10/2019  . Drug-induced neutropenia ([place]) 12/27/2018  . Acute myeloid leukemia not having achieved remission ([place]) 11/25/2018  . Dehydration 11/25/2018  . Tumor lysis syndrome 11/25/2018  . MDS/MPN (myelodysplastic/myeloproliferative neoplasms) ([place]) 08/28/2018  . Goals of care, counseling/discussion 08/28/2018  . Anemia 08/02/2018  . Adult idiopathic generalized osteoporosis 02/22/2017  . MPN (myeloproliferative neoplasm) ([place]) 07/17/2016  . Low serum vitamin D 07/17/2016  . Splenomegaly 02/22/2016  . Essential hypertension 02/22/2015  . History of breast cancer 02/22/2015   . History of kidney stones 02/22/2015  . Hyperlipidemia, mixed 02/22/2015  . Vitamin D deficiency 02/22/2015  . Fibroid, uterine 12/06/2008  . Hydronephrosis 12/06/2008  . Infection, Klebsiella 12/06/2008  . Nephrolithiasis 12/06/2008    Orientation RESPIRATION BLADDER Height & Weight     Self  Normal Incontinent Weight: 103 lb 9.6 oz (47 kg) Height:  4\' 11"  (149.9 cm)  BEHAVIORAL SYMPTOMS/MOOD NEUROLOGICAL BOWEL NUTRITION STATUS      Incontinent Diet(heart healthy thin liquids)  AMBULATORY STATUS COMMUNICATION OF NEEDS Skin   Extensive Assist Does not communicate Normal                       Personal Care Assistance Level of Assistance  Bathing, Feeding, Dressing Bathing Assistance: Maximum assistance Feeding assistance: Maximum assistance Dressing Assistance: Maximum assistance     Functional Limitations Info  Sight, Speech Sight Info: Impaired   Speech Info: Impaired    SPECIAL CARE FACTORS FREQUENCY                       Contractures Contractures Info: Not present    Additional Factors Info  Code Status, Allergies Code Status Info: DNR Allergies Info: Terbinafine           Current Medications (08/11/2019):  This is the current hospital active medication list Current Facility-Administered Medications  Medication Dose Route Frequency Provider Last Rate Last Admin  . acyclovir (ZOVIRAX) 200 MG capsule 400 mg  400 mg Oral Daily Fritzi Mandes, MD      . albuterol (PROVENTIL) (2.5 MG/3ML) 0.083% nebulizer solution 2.5 mg  2.5 mg Inhalation Q4H PRN Ivor Costa, MD      . allopurinol (ZYLOPRIM) tablet 50 mg  50 mg Oral Loney Laurence C, MD   50 mg at 08/09/19 1324  . atenolol (TENORMIN) tablet 50 mg  50 mg Oral BID Ivor Costa, MD   50 mg at 08/09/19 0858  . Chlorhexidine Gluconate Cloth 2 % PADS 6 each  6 each Topical Daily Ivor Costa, MD   6 each at 08/11/19 [phone]  . dexamethasone (DECADRON) injection 4 mg  4 mg Intravenous Q12H Lequita Asal, MD   4 mg at 08/10/19 2238  . dextromethorphan-guaiFENesin (MUCINEX DM) 30-600 MG per 12 hr tablet 1 tablet  1 tablet Oral BID Ivor Costa, MD   1 tablet at 08/09/19 0859  . hydrALAZINE (APRESOLINE) tablet 25 mg  25 mg Oral TID PRN Ivor Costa, MD      . levofloxacin [facility]) tablet 250 mg  250 mg Oral Q48H Fritzi Mandes, MD      . magic mouthwash w/lidocaine  5 mL Oral QID PRN Ivor Costa, MD      . magnesium hydroxide (MILK OF MAGNESIA) suspension 5 mL  5 mL Oral Q4H PRN Ivor Costa, MD      . ondansetron [facility]) injection 4 mg  4 mg Intravenous Q8H PRN Ivor Costa, MD   4 mg at 08/10/19 2245  . pantoprazole (PROTONIX) EC tablet 40 mg  40 mg Oral Q1200 Ivor Costa, MD   40 mg at 08/09/19 0857  . posaconazole (NOXAFIL) delayed-release tablet 100 mg  100 mg Oral Daily Fritzi Mandes, MD      . senna [facility]et) tablet 17.2 mg  2 tablet Oral BID Ivor Costa, MD   Stopped at 08/10/19 2241   Facility-Administered Medications Ordered in Other Encounters  Medication Dose Route Frequency Provider Last Rate Last Admin  . 0.9 %  sodium chloride infusion   Intravenous Continuous Jacquelin Hawking, NP   Stopped at 02/05/19 1630  . heparin lock flush 100 unit/mL  250 Units Intravenous Once Cammie Sickle, MD         Discharge Medications: Please see discharge summary for a list of discharge medications.  Relevant Imaging Results:  Relevant Lab Results:   Additional Information SSN: [id number]  Judeen Geralds, Gardiner Rhyme, LCSW

## 2019-08-11 NOTE — Progress Notes (Signed)
[place], [place] 08/11/19  Subjective:   Hospital day # 3 Patient is clinically doing worse today Husband at bedside Serum creatinine is slightly worse WBC count is significantly elevated   Renal: 02/28 0701 - 03/01 0700 In: 1407.9 [P.O.:360; I.V.:907.9; Blood:90; IV Piggyback:50] Out: 750 [Urine:750] Lab Results  Component Value Date   CREATININE 1.68 (H) 08/11/2019   CREATININE 1.59 (H) 08/10/2019   CREATININE 1.40 (H) 08/09/2019     Objective:  Vital signs in last 24 hours:  Temp:  [97.5 F (36.4 C)-98.3 F (36.8 C)] 98.3 F (36.8 C) (02/28 1946) Pulse Rate:  [64-92] 92 (03/01 0813) Resp:  [16-18] 16 (03/01 0649) BP: (128-179)/(67-83) 140/76 (03/01 0813) SpO2:  [97 %-100 %] 98 % (03/01 0813)  Weight change:  Filed Weights   08/08/19 1400 08/08/19 1447  Weight: 46.2 kg 47 kg    Intake/Output:    Intake/Output Summary (Last 24 hours) at 08/11/2019 1130 Last data filed at 08/11/2019 0145 Gross per 24 hour  Intake 1167.92 ml  Output 750 ml  Net 417.92 ml     Physical Exam: General:  Chronically ill-appearing, laying in the bed  HEENT  dry oral mucous membranes, oral ulcers, decreased hearing  Pulm/lungs  normal effort,  decreased breath sounds at bases  CVS/Heart  no rub  Abdomen:   Soft, nontender  Extremities:  No peripheral edema  Neurologic:  Alert, able to follow simple commands  Skin:  Warm    Foley in place with dark urine       Basic Metabolic Panel:  Recent Labs  Lab 08/05/19 0810 08/05/19 0810 08/08/19 0811 08/08/19 0811 08/09/19 0559 08/10/19 0457 08/11/19 0318  NA 138  --  138  --  139 138 142  K 3.8  --  3.9  --  4.0 4.0 4.0  CL 102  --  103  --  107 108 108  CO2 24  --  23  --  22 17* 24  GLUCOSE 180*  --  136*  --  147* 150* 135*  BUN 47*  --  56*  --  55* 64* 73*  CREATININE 1.21*  --  1.48*  --  1.40* 1.59* 1.68*  CALCIUM 9.1   < > 8.9   < > 8.0* 8.1* 7.7*  MG  --   --   --   --  1.8 1.9   --   PHOS  --   --   --   --  4.6 4.7*  --    < > = values in this interval not displayed.     CBC: Recent Labs  Lab 08/05/19 0810 08/05/19 0810 08/08/19 0811 08/08/19 0811 08/09/19 0559 08/09/19 1708 08/10/19 0457 08/10/19 1830 08/11/19 1021  WBC 18.6*   < > 37.2*   < > 39.9* 76.6* 65.4* 63.2* 93.4*  NEUTROABS 2.2  --  6.3  --  5.6  --   --   --   --   HGB 7.5*   < > 8.0*   < > 7.3* 8.2* 8.4* 8.0* 6.9*  HCT 24.7*   < > 26.0*   < > 22.8* 25.7* 26.1* 24.5* 21.0*  MCV 88.8   < > 87.2   < > 87.0 87.7 87.6 85.4 86.1  PLT 17*   < > 32*   < > 22* 50* 46* 43* 67*   < > = values in this interval not displayed.     No results found for: HEPBSAG,  HEPBSAB, [place]    Microbiology:  Recent Results (from the past 240 hour(s))  SARS CORONAVIRUS 2 (TAT 6-24 HRS) Nasopharyngeal Nasopharyngeal Swab     Status: None   Collection Time: 08/08/19  1:20 PM   Specimen: Nasopharyngeal Swab  Result Value Ref Range Status   SARS Coronavirus 2 NEGATIVE NEGATIVE Final    Comment: (NOTE) SARS-CoV-2 target nucleic acids are NOT DETECTED. The SARS-CoV-2 RNA is generally detectable in upper and lower respiratory specimens during the acute phase of infection. Negative results do not preclude SARS-CoV-2 infection, do not rule out co-infections with other pathogens, and should not be used as the sole basis for treatment or other patient management decisions. Negative results must be combined with clinical observations, patient history, and epidemiological information. The expected result is Negative. Fact Sheet for Patients: SugarRoll.be Fact Sheet for Healthcare Providers: https://www.woods-mathews.com/ This test is not yet approved or cleared by the [place] FDA and  has been authorized for detection and/or diagnosis of SARS-CoV-2 by FDA under an Emergency Use Authorization (EUA). This EUA will remain  in effect (meaning this test can be used) for  the duration of the COVID-19 declaration under Section 56 4(b)(1) of the Act, 21 U.S.C. section 360bbb-3(b)(1), unless the authorization is terminated or revoked sooner. Performed at [facility], [place] [address], [place],  [address]     Coagulation Studies: Recent Labs    08/08/19 1313 08/09/19 0559 08/10/19 1143 08/11/19 0318  LABPROT 19.8* 19.5* 28.2* 29.8*  INR 1.7* 1.7* 2.7* 2.8*    Urinalysis: Recent Labs    08/09/19 2357  COLORURINE AMBER*  LABSPEC 1.019  PHURINE 5.0  GLUCOSEU NEGATIVE  HGBUR LARGE*  BILIRUBINUR NEGATIVE  KETONESUR NEGATIVE  PROTEINUR 100*  NITRITE NEGATIVE  LEUKOCYTESUR NEGATIVE      Imaging: No results found.   Medications:   . sodium chloride 0.45 % 1,000 mL with sodium bicarbonate 75 mEq infusion 100 mL/hr at 08/11/19 0326   . acyclovir  400 mg Oral Daily  . allopurinol  50 mg Oral QODAY  . atenolol  50 mg Oral BID  . Chlorhexidine Gluconate Cloth  6 each Topical Daily  . dexamethasone (DECADRON) injection  4 mg Intravenous Q12H  . dextromethorphan-guaiFENesin  1 tablet Oral BID  . levofloxacin  250 mg Oral Q48H  . pantoprazole  40 mg Oral Q1200  . posaconazole  100 mg Oral Daily  . senna  2 tablet Oral BID   albuterol, hydrALAZINE, magic mouthwash w/lidocaine, magnesium hydroxide, ondansetron (ZOFRAN) IV  Assessment/ Plan:  86 y.o. female with acute myeloid leukemia, hypertension, hyperlipidemia, breast cancer, anemia, thrombocytopenia  admitted on 08/08/2019 for Leukemia, acute myeloid ([place]) [C92.00] Differentiation syndrome [T88.7XXA] AKI (acute kidney injury) ([place]) [N17.9]  #Acute kidney injury Secondary to tumor lysis syndrome Lab Results  Component Value Date   CREATININE 1.68 (H) 08/11/2019   CREATININE 1.59 (H) 08/10/2019   CREATININE 1.40 (H) 08/09/2019  Urine output recorded at 750 cc Currently getting IV hydration with half-normal saline plus sodium bicarbonate at 100 cc/h Sodium, potassium,  bicarbonate within normal limits BUN and creatinine trends are worsening Continue supportive care  #Acute myelogenous leukemia Given dexamethasone,  IV rasburicase on February 28 Uric acid improved to 6.3  Case discussed with Husband   LOS: [place] 3/1/202111:30 AM  [place], [place]  Note: This note was prepared with Dragon dictation. Any transcription errors are unintentional

## 2019-08-11 NOTE — Consult Note (Signed)
[place]  Telephone:([phone] [phone]   Name: Kathryn Hahn Date: 08/11/2019 MRN: [id number]  DOB: 03/04/1934  Patient Care Team: Rusty Aus, MD as PCP - General (Internal Medicine)    REASON FOR CONSULTATION: Kathryn Hahn is a 86 y.o. female with multiple medical problems including recurrent/relapsed AML since June 2021 status post chemotherapy most recently treated with gilteritinib.  Patient is transfusion dependent requiring weekly platelets.  She was admitted to the hospital from the cancer center on 08/08/2019 with progressive weakness.  She was found to have AKI with worsening liver function and tumor lysis syndrome/DIC. She was referred to palliative care to help address goals.  SOCIAL HISTORY:     reports that she has never smoked. She has never used smokeless tobacco. She reports current alcohol use of about 3.0 standard drinks of alcohol per week. She reports that she does not use drugs.   Patient is married and lives at home with her husband.  She has 5 daughters, one of whom lives in [place] and the others live out of state.  Patient worked as an Automotive engineer.  ADVANCE DIRECTIVES:  On file  CODE STATUS: DNR/DNI (MOST form completed on 06/30/19)  PAST MEDICAL HISTORY: Past Medical History:  Diagnosis Date  . Anemia   . Arthritis   . Breast cancer ([place]) 2003   RT LUMPECTOMY  . Edema leg   . History of breast cancer 2003   post lumpectomy  . History of kidney stones   . Hyperlipemia, mixed   . Hypertension, essential   . Leukemia ([place])   . Leukocytosis   . MDS (myelodysplastic syndrome) ([place])   . Personal history of radiation therapy 2003   BREAST CA  . Renal stones   . Vitamin D deficiency     PAST SURGICAL HISTORY:  Past Surgical History:  Procedure Laterality Date  . BREAST BIOPSY Right 2003   Postive for cancer  . BREAST LUMPECTOMY Right 2003   BREAST CA  .  KIDNEY STONE SURGERY Right     HEMATOLOGY/ONCOLOGY HISTORY:  Oncology History Overview Note  # SEP 2017- MYELOPROLIFERATIVE NEOPLASM- [WBC- 42; normal Hb/platelets] hypercellular bone marrow 90-95% proliferation of myeloid cells in various stages of maturation; relative erythroid hypoplasia and proliferation of atypical megakaryocytes; no increase in blasts; peripheral blood Bcr-Abl-NEG; Cytogenetics- WNL. NEG- Jak-2/MPL/CALR [place] limited- mild splenomegaly [~10cm; 463cm3]; OCT 2017- second opinion at [facility]. Surveillance.   # With progressive leukocytosis we evaluated her a month ago and sent BM for exam. She has a progressive leukocytosis so hydrea '500mg'$  daily was started on 10/20/2016 and increased to 2gm daily then back down to one daily, then 5 days per week over time due to counts drop. Then we held her hydrea since 04/09/2018 due to continued declining Hb and Plt.s  BMB 06/24/18 showed markedly hypercellular BM 95% with myeloid hyperplasia and atypical megakaryocytic hyperplasia. No significant increase in blasts. 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 BCR / ABL negative, FISH normal. Flow Showed 2%CD34-positive myeloid blasts. Myeloid precursors with low side scatter. Pathogenic variants were detected in the ASXL1, CCND2, CUX1, and U2AF1 genes.  # March 2020- wbc- 14/ Hb 9.5/ platelets- 54.   ---------------------------------------------------   # June 8th 2020- Acute myeloid leukemia [peripheral blood flow cytometry;NGS- pending]- June 15th Vidaza [SQ 1-5 + Venatoclax- #1in pt]; tumor lysis prophylaxis  # Day #28 bone marrow biopsy-  7/14- BMx- NO BLASTS; positive for  dysplastic changes. OFF Venatoclax [since 7/17] sec to cytopenias.  #817 cycle #2 Vidaza+ venetoclax 100 mg; stop venetoclax on 8/24 [severe pancytopenia]   # 02/18/2019-start gilteritinib 80 mg a day; HELD on 9/14 [sec to severe pan]  # NOV 9th/2020-BMBx-  RELAPSED AML; NOV 11th- STARTED GILTERITINIB '40mg'$ /day; NOV17th 2020- PROMACTA '50mg'$ /day; NOV 30th- inrease Gilt to 80 mg/day; DEC 31st-STOP Gilt/ Promatca [no reponse; JAN 5th BMBx- partial response; JAN 8th 2021-PROCEED with GILTERTINIB '120mg'$ /day.    # AUG 2020- AST elevation- 248-?sec to Posonoazole; RESOLVED; continue at lower dose 100 mg/day.   # 2002 [[place]] BREAST CA s/p Lumpect RT; [? Stage I] no chemo s/p AI  # F-ONE HEM: FLT3 ALTERATION NOTED  # JAN 2021- 1/18-Palliative care evaluation.   DIAGNOSIS: ACUTE MYELOID LEUKEMIA GOALS: pallaitive  CURRENT/MOST RECENT THERAPY : Gilteritinib     MDS (myelodysplastic syndrome) (HCC) (Resolved)  08/28/2018 Initial Diagnosis   MDS (myelodysplastic syndrome) (HCC)   Acute myeloid leukemia not having achieved remission ([place])  11/25/2018 Initial Diagnosis   AML (acute myeloid leukemia) with failed remission ([place])   11/25/2018 - 12/22/2018 Chemotherapy   The patient had azaCITIDine (VIDAZA) chemo injection 105 mg, 75 mg/m2 = 105 mg, Subcutaneous,  Once, 1 of 4 cycles Administration: 105 mg (11/25/2018), 105 mg (11/26/2018), 105 mg (11/27/2018), 105 mg (11/28/2018), 105 mg (11/29/2018)  for chemotherapy treatment.    01/27/2019 -  Chemotherapy   The patient had palonosetron (ALOXI) injection 0.25 mg, 0.25 mg, Intravenous,  Once, 1 of 4 cycles Administration: 0.25 mg (01/27/2019), 0.25 mg (01/29/2019), 0.25 mg (01/31/2019) azaCITIDine (VIDAZA) 100 mg in sodium chloride 0.9 % 50 mL chemo infusion, 75 mg/m2 = 100 mg, Intravenous, Once, 1 of 4 cycles Administration: 100 mg (01/27/2019), 100 mg (01/28/2019), 100 mg (01/29/2019), 100 mg (01/30/2019), 100 mg (01/31/2019)  for chemotherapy treatment.      ALLERGIES:  is allergic to terbinafine.  MEDICATIONS:  Current Facility-Administered Medications  Medication Dose Route Frequency Provider Last Rate Last Admin  . acyclovir (ZOVIRAX) 200 MG capsule 400 mg  400 mg Oral Daily Fritzi Mandes, MD      .  albuterol (PROVENTIL) (2.5 MG/3ML) 0.083% nebulizer solution 2.5 mg  2.5 mg Inhalation Q4H PRN Ivor Costa, MD      . allopurinol (ZYLOPRIM) tablet 50 mg  50 mg Oral QODAY Corcoran, Melissa C, MD   50 mg at 08/09/19 1324  . atenolol (TENORMIN) tablet 50 mg  50 mg Oral BID Ivor Costa, MD   50 mg at 08/09/19 0858  . Chlorhexidine Gluconate Cloth 2 % PADS 6 each  6 each Topical Daily Ivor Costa, MD   6 each at 08/11/19 [phone]  . dexamethasone (DECADRON) injection 4 mg  4 mg Intravenous Q12H Lequita Asal, MD   4 mg at 08/10/19 2238  . dextromethorphan-guaiFENesin (MUCINEX DM) 30-600 MG per 12 hr tablet 1 tablet  1 tablet Oral BID Ivor Costa, MD   1 tablet at 08/09/19 0859  . hydrALAZINE (APRESOLINE) tablet 25 mg  25 mg Oral TID PRN Ivor Costa, MD      . levofloxacin [facility]) tablet 250 mg  250 mg Oral Q48H Fritzi Mandes, MD      . magic mouthwash w/lidocaine  5 mL Oral QID PRN Ivor Costa, MD      . magnesium hydroxide (MILK OF MAGNESIA) suspension 5 mL  5 mL Oral Q4H PRN Ivor Costa, MD      . ondansetron [facility]) injection 4 mg  4 mg Intravenous Q8H  PRN Ivor Costa, MD   4 mg at 08/10/19 2245  . pantoprazole (PROTONIX) EC tablet 40 mg  40 mg Oral Q1200 Ivor Costa, MD   40 mg at 08/09/19 0857  . posaconazole (NOXAFIL) delayed-release tablet 100 mg  100 mg Oral Daily Fritzi Mandes, MD      . senna [facility]) tablet 17.2 mg  2 tablet Oral BID Ivor Costa, MD   Stopped at 08/10/19 2241  . sodium chloride 0.45 % 1,000 mL with sodium bicarbonate 75 mEq infusion   Intravenous Continuous Liana Gerold, MD 100 mL/hr at 08/11/19 0326 New Bag at 08/11/19 0326   Facility-Administered Medications Ordered in Other Encounters  Medication Dose Route Frequency Provider Last Rate Last Admin  . 0.9 %  sodium chloride infusion   Intravenous Continuous Jacquelin Hawking, NP   Stopped at 02/05/19 1630  . heparin lock flush 100 unit/mL  250 Units Intravenous Once Cammie Sickle, MD        VITAL SIGNS: BP  140/76 (BP Location: Left Arm)   Pulse 92   Temp 98.3 F (36.8 C) (Oral)   Resp 16   Ht '4\' 11"'$  (1.499 m)   Wt 103 lb 9.6 oz (47 kg)   SpO2 98%   BMI 20.92 kg/m  Filed Weights   08/08/19 1400 08/08/19 1447  Weight: 101 lb 12.8 oz (46.2 kg) 103 lb 9.6 oz (47 kg)    Estimated body mass index is 20.92 kg/m as calculated from the following:   Height as of this encounter: '4\' 11"'$  (1.499 m).   Weight as of this encounter: 103 lb 9.6 oz (47 kg).  LABS: CBC:    Component Value Date/Time   WBC 93.4 (HH) 08/11/2019 1021   HGB 6.9 (L) 08/11/2019 1021   HGB 7.1 (L) 11/25/2018 0904   HCT 21.0 (L) 08/11/2019 1021   PLT 67 (L) 08/11/2019 1021   MCV 86.1 08/11/2019 1021   NEUTROABS 5.6 08/09/2019 0559   LYMPHSABS 13.6 (H) 08/09/2019 0559   MONOABS 15.6 (H) 08/09/2019 0559   EOSABS 0.0 08/09/2019 0559   BASOSABS 0.0 08/09/2019 0559   Comprehensive Metabolic Panel:    Component Value Date/Time   NA 142 08/11/2019 0318   K 4.0 08/11/2019 0318   CL 108 08/11/2019 0318   CO2 24 08/11/2019 0318   BUN 73 (H) 08/11/2019 0318   CREATININE 1.68 (H) 08/11/2019 0318   GLUCOSE 135 (H) 08/11/2019 0318   CALCIUM 7.7 (L) 08/11/2019 0318   AST 423 (H) 08/11/2019 0318   ALT 50 (H) 08/11/2019 0318   ALKPHOS 134 (H) 08/11/2019 0318   BILITOT 0.7 08/11/2019 0318   PROT 5.1 (L) 08/11/2019 0318   ALBUMIN 3.1 (L) 08/11/2019 0318    RADIOGRAPHIC STUDIES: US Abdomen Complete  Result Date: 08/09/2019 CLINICAL DATA:  86 year old female with elevated LFTs and worsening renal function. History of relapsed AML. EXAM: ABDOMEN ULTRASOUND COMPLETE COMPARISON:  02/29/2016 ultrasound FINDINGS: Gallbladder: A 7 mm nonshadowing nonmobile gallstone versus polyp is noted. There is no evidence of gallbladder wall thickening, pericholecystic fluid or sonographic Murphy sign. Common bile duct: Diameter: 5 mm. No intrahepatic or extrahepatic biliary dilatation. Liver: No focal lesion identified. Within normal limits in  parenchymal echogenicity. Portal vein is patent on color Doppler imaging with normal direction of blood flow towards the liver. IVC: No abnormality visualized. Pancreas: Visualized portion unremarkable. Spleen: UPPER limits normal spleen size with a splenic volume of 350 cc. No focal splenic abnormalities are noted. Right Kidney:  Length: 10.1 cm. Diffusely increased renal echogenicity noted without hydronephrosis or mass. No definite renal calculi are noted. Left Kidney: Length: 9.9 cm. Diffusely increased renal echogenicity noted without hydronephrosis or mass. No definite renal calculi are identified. Abdominal aorta: No aneurysm visualized. Other findings: None. IMPRESSION: 1. 7 mm nonshadowing nonmobile gallstone versus gallbladder polyp. No evidence of acute cholecystitis or biliary dilatation. 2. Echogenic kidneys, compatible with medical renal disease. No hydronephrosis. 3. UPPER limits normal spleen size. 4. Unremarkable liver. 5. Trace ascites. Electronically Signed   By: Margarette Canada M.D.   On: 08/09/2019 08:49    PERFORMANCE STATUS (ECOG) : 4 - Bedbound  Review of Systems Unable to complete  Physical Exam General: Ill-appearing Pulmonary: Unlabored Extremities: no edema, no joint deformities Skin: no rashes Neurological: Wakes with stimulation, confused  IMPRESSION: Patient is ill-appearing.  She wakes with stimulation but is confused.  Communication is challenged by her difficulty hearing.  Patient had altered mental status earlier today likely was exacerbated by sedating medications.  She is a bit more alert now.  Unfortunately, overall clinically appears worse.  Her leukocytosis, transaminases, and serum creatinine are worsening.  Hemoglobin is down.  I met with patient's husband who was at bedside.  He has a meeting scheduled with Dr. Rogue Bussing later today.  Husband says that he anticipates being told that further treatment is not an option and the patient is likely at  end-of-life.  He says that he had his daughters come to the hospital to say their goodbyes.  Husband verbalized feeling that further treatment may be "cruel" if there was no hope for meaningful improvement.  Husband says that he is leaning towards just keeping patient comfortable.  We did discuss the option of hospice involvement.  Husband would prefer to keep patient at a facility and says that he would make that decision based off Dr. Aletha Halim thoughts of how long patient would survive after discontinuation of transfusions.  PLAN: -Continue current scope of treatment -DNR/DNI (previously have completed a MOST form) -Will follow   Time Total: 60 minutes  Visit consisted of counseling and education dealing with the complex and emotionally intense issues of symptom management and palliative care in the setting of serious and potentially life-threatening illness.Greater than 50%  of this time was spent counseling and coordinating care related to the above assessment and plan.  Signed by: Altha Harm, PhD, NP-C

## 2019-08-11 NOTE — TOC Progression Note (Signed)
Transition of Care [facility]) - Progression Note    Patient Details  Name: Chazlynn Redler MRN: [id number] Date of Birth: [birth year]  Transition of Care [facility]) CM/SW Contact  Marka Treloar, Gardiner Rhyme, LCSW Phone Number: 08/11/2019, 12:53 PM  Clinical Narrative:   MD reports pt has been made comfort care and family is wanting to pursue Hospice Home. Have referred to [facility]_hospice liaison and she will meet with husband. Will follow along to assist with needs.         Expected Discharge Plan and Services                                                 Social Determinants of Health (SDOH) Interventions    Readmission Risk Interventions No flowsheet data found.

## 2019-08-11 NOTE — Progress Notes (Signed)
[place] [facility] [place] NAME: Kathryn Hahn    MR#:  [id number]  DATE OF BIRTH:  1933-08-27  SUBJECTIVE:  Very groggy after getting phenergan last pm. C/o dry and raw throat. Bleeding gums Unable to hold longer conversation. Had a sip of water  REVIEW OF SYSTEMS:   Review of Systems  Constitutional: Positive for malaise/fatigue. Negative for chills, fever and weight loss.  HENT: Positive for sore throat. Negative for ear discharge, ear pain and nosebleeds.        Bleeding gums  Eyes: Negative for blurred vision, pain and discharge.  Respiratory: Negative for sputum production, shortness of breath, wheezing and stridor.   Cardiovascular: Negative for chest pain, palpitations, orthopnea and PND.  Gastrointestinal: Negative for abdominal pain, diarrhea, nausea and vomiting.  Genitourinary: Negative for frequency and urgency.  Musculoskeletal: Negative for back pain and joint pain.  Neurological: Positive for weakness. Negative for sensory change, speech change and focal weakness.  Psychiatric/Behavioral: Negative for depression and hallucinations. The patient is not nervous/anxious.    Tolerating Diet:no Tolerating PT: no  DRUG ALLERGIES:   Allergies  Allergen Reactions  . Terbinafine Rash and Swelling    VITALS:  Blood pressure 140/76, pulse 92, temperature 98.3 F (36.8 C), temperature source Oral, resp. rate 16, height 4\' 11"  (1.499 m), weight 47 kg, SpO2 98 %.  PHYSICAL EXAMINATION:   Physical Exam  GENERAL:  86 y.o.-year-old patient lying in the bed with no acute distress. Weak Pallor+ overall appears very  ill EYES: Pupils equal, round, reactive to light and accommodation. No scleral icterus.   HEENT: Head atraumatic, normocephalic. Oropharynx and nasopharynx clear.  Bleeding gums+ NECK:  Supple, no jugular venous distention. No thyroid enlargement, no tenderness.  LUNGS: Normal breath sounds bilaterally, no wheezing,  rales, rhonchi. No use of accessory muscles of respiration.  CARDIOVASCULAR: S1, S2 normal. No murmurs, rubs, or gallops.  ABDOMEN: Soft, nontender, nondistended. Bowel sounds present. No organomegaly or mass.  EXTREMITIES: No cyanosis, clubbing  ++ edema b/l.    NEUROLOGIC: non focal moves extremities  PSYCHIATRIC:  patient is alert on VC but falls back to sleep--sedated SKIN petechia over the skin, generalized pallor ++  LABORATORY PANEL:  CBC Recent Labs  Lab 08/10/19 1830  WBC 63.2*  HGB 8.0*  HCT 24.5*  PLT 43*    Chemistries  Recent Labs  Lab 08/10/19 0457 08/10/19 0457 08/11/19 0318  NA 138   < > 142  K 4.0   < > 4.0  CL 108   < > 108  CO2 17*   < > 24  GLUCOSE 150*   < > 135*  BUN 64*   < > 73*  CREATININE 1.59*   < > 1.68*  CALCIUM 8.1*   < > 7.7*  MG 1.9  --   --   AST 356*   < > 423*  ALT 50*   < > 50*  ALKPHOS 133*   < > 134*  BILITOT 0.6   < > 0.7   < > = values in this interval not displayed.   Cardiac Enzymes No results for input(s): TROPONINI in the last 168 hours. RADIOLOGY:  No results found. ASSESSMENT AND PLAN:  Kathryn Hahn is a 86 y.o. female with medical history significant of relapsed acute myeloid leukemia, hypertension, hyperlipidemia, breast cancer, anemia, thrombocytopenia, who is directly admitted from cancer center per Dr. Rogue Bussing due to worsening liver function and renal function.   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 Rogue Bussing -IV dexamethasone 10 mg bid -pt overall has declined significantly with worsened LFT's and Kidney numbers -lethargic from pheneragn recieved last pm--able to talk little -Bleeding gums this am -Dr B to d/w husband and dters this afternoon. At present holding chemo.  Tumor lysis syndrome with  acute DIC given AML with Acute renal failure -IVF - G6PD was done in 11/2018 28.1 -IV rasburicase per Dr Bryn Gulling Acid 6.3 -started on po allopurinol -transfusion reaction w/u negative -transfused 1 pool of cryoppt today given low fibrinogen -CBC bid -d/c KCL and Calcium carbonate  -Phosph stable--now on IV NS with bicarb gtt -worsening creat  HTN:  -holding since unable to take meds -hydralazine prn  Thrombocytopenia ([place]): Platelet 32--50--46  - pt has bleeding gums abd severe petechia  Acute renal failure in the setting of Tumor lysis syndrome and AML -cont IVF -monitor input/output  -avoid nephrotoxins -[place]  Placed on 08/10/2019 per neurology recommendation to monitor input output. -creatinine worsening -appreciate Dr Toya Smothers input  Patient overall critically ill--has a poor prognosis Dr B is planning to meet with family this afternoon  Procedures: Family communication  Updated by oncology Consults :Oncology, nephrology Discharge Disposition :TBD CODE STATUS: Dnr prior to admission DVT Prophylaxis :SCD due to severe TCP Barriers to discharge: tx for AML, renal failure, Tumor lysis syndrome  TOTAL TIME TAKING CARE OF THIS PATIENT: *30* minutes.  >50% time spent on counselling and coordination of care  Note: This dictation was prepared with Dragon dictation along with smaller phrase technology. Any transcriptional errors that result from this process are unintentional.  Fritzi Mandes M.D    [facility]   CC: Primary care physician; Rusty Aus, MDPatient ID: Kathryn Hahn, female   DOB: 11/21/1933, 86 y.o.   MRN: [id number]

## 2019-08-11 NOTE — Progress Notes (Signed)
Kathryn Hahn   DOB:12/26/1933   SF:[id number]    Subjective: Overnight patient had worsening shortness of breath needing oxygen.  She is currently on 2 L of oxygen.  Patient also complained of nausea-not improved on Zofran.  Got Phenergan around midnight.  Patient since then has been sleeping most of the time.  Barely awake as per nursing. Review of Systems  Unable to perform ROS: Patient unresponsive     Objective:  Vitals:   08/11/19 0649 08/11/19 0813  BP: 135/67 140/76  Pulse: 70 92  Resp: 16   Temp:    SpO2: 97% 98%     Intake/Output Summary (Last 24 hours) at 08/11/2019 1255 Last data filed at 08/11/2019 1248 Gross per 24 hour  Intake 521.72 ml  Output 1000 ml  Net -478.28 ml    Physical Exam  Constitutional:  Thin cachectic appearing female patient sleeping.  Not arousable to verbal commands.  HENT:  Head: Atraumatic.  Mouth/Throat: No oropharyngeal exudate.  Mucocutaneous bleeding noted.  Cardiovascular: Regular rhythm.  Tachycardic.  Pulmonary/Chest: No respiratory distress. She has no wheezes.  Positive for crackles bilaterally.  Abdominal: Soft. Bowel sounds are normal. She exhibits no distension and no mass. There is no abdominal tenderness. There is no rebound and no guarding.  Musculoskeletal:        General: No tenderness or edema. Normal range of motion.  Neurological:  Patient sleeping drowsy  Skin: Skin is warm.  Multiple bruises noted     Labs:  Lab Results  Component Value Date   WBC 93.4 (HH) 08/11/2019   HGB 6.9 (L) 08/11/2019   HCT 21.0 (L) 08/11/2019   MCV 86.1 08/11/2019   PLT 67 (L) 08/11/2019   NEUTROABS 5.6 08/09/2019    Lab Results  Component Value Date   NA 142 08/11/2019   K 4.0 08/11/2019   CL 108 08/11/2019   CO2 24 08/11/2019    Studies:  No results found.  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  Patient in acute DIC-secondary to underlying underlying progressive leukemia.  #Change in mentation-Phenergan versus intracranial bleed.   #Metabolic derangement-worsening renal function/hepatic function-secondary to underlying progressive malignancy  Recommendations:   I had a long discussion the patient's husband regarding progressive refractory acute leukemia-and also worsening organ dysfunction/failure.  Patient unfortunately is not a candidate for any further therapies-given her organ dysfunction/performance status/risk of chemotherapy outweighs the benefits.  Hold chemotherapy.  Patient's husband is interested in comfort measures only.  Husband is very concerned about significant decline in her quality of life over the past many months.  Discussed that best estimated survival is in order of days-week.  Recommend hospice-discussed regarding placement choices.  As per the husband patient's daughters/family in agreement.  Discussed with [employer]; and also with Dr. Posey Pronto.  We will also inform Dr. Brooke Dare  at [facility].  # 45 minutes face-to-face with the patient's husband [patient is drowsy/unable to make any decisions] discussing the above plan of care; more than 50% of time spent on prognosis/ natural history; counseling and coordination.   Cammie Sickle, MD 08/11/2019  12:55 PM

## 2019-08-11 NOTE — Telephone Encounter (Signed)
Spoke to patient's husband regarding patient's declining mentation-Phenergan versus brain bleed.  Discussed that we will hold chemotherapy if patient's clinical condition does not improve.   Patient's husband to be available-around noon today.  We will again visit with the husband by the patient's bedside.

## 2019-08-11 NOTE — TOC Initial Note (Signed)
Transition of Care [facility]) - Initial/Assessment Note    Patient Details  Name: Kathryn Hahn MRN: [id number] Date of Birth: 1933/07/10  Transition of Care [facility]) CM/SW Contact:    Elease Hashimoto, LCSW Phone Number: 08/11/2019, 2:56 PM  Clinical Narrative:  Met with husband and daughter to discuss plan. Husband would like for them to go back to [facility] for pt and he lives kin the independent living part. They feel pt would be more comfortable there with hospice services and were told no beds at hospice home currently. Need another COVID test according to [place] at [facility]. MD to order for today. [facility] will have a bed tomorrow and will work on getting her transferred over tomorrow. Will see husband @ 10:00 tomorrow. Aware will go non-emergency EMS and will gather paperwork for tomorrow. Husband and daughter want pt comfortable and at peace, she has fought long and hard this disease.  See in am               Expected Discharge Plan: [place] Barriers to Discharge: Continued Medical Work up   Patient Goals and CMS Choice Patient states their goals for this hospitalization and ongoing recovery are:: Husband aswers for wife. We want her to be at peace and comfortable      Expected Discharge Plan and Services Expected Discharge Plan: [place] In-house Referral: Clinical Social Work   Post Acute Care Choice: Hospice Living arrangements for the past 2 months: [place]                                      Prior Living Arrangements/Services Living arrangements for the past 2 months: [place] Lives with:: Spouse          Need for Family Participation in Patient Care: Yes (Comment) Care giver support system in place?: Yes (comment)      Activities of Daily Living Home Assistive Devices/Equipment: Cane (specify quad or straight) ADL Screening (condition at time of admission) Patient's  cognitive ability adequate to safely complete daily activities?: Yes Is the patient deaf or have difficulty hearing?: No Does the patient have difficulty seeing, even when wearing glasses/contacts?: No Does the patient have difficulty concentrating, remembering, or making decisions?: No Patient able to express need for assistance with ADLs?: Yes Does the patient have difficulty dressing or bathing?: No Independently performs ADLs?: Yes (appropriate for developmental age) Does the patient have difficulty walking or climbing stairs?: Yes Weakness of Legs: Both Weakness of Arms/Hands: None  Permission Sought/Granted                  Emotional Assessment Appearance:: Appears stated age Attitude/Demeanor/Rapport: Other (comment)(not able to assess) Affect (typically observed): Quiet Orientation: : Oriented to Self      Admission diagnosis:  Leukemia, acute myeloid ([place]) [C92.00] Differentiation syndrome [T88.7XXA] AKI (acute kidney injury) ([place]) [N17.9] Patient Active Problem List   Diagnosis Date Noted  . Palliative care encounter   . AKI (acute kidney injury) ([place]) 08/08/2019  . Abnormal LFTs 08/08/2019  . GERD (gastroesophageal reflux disease) 08/08/2019  . Moderate protein-calorie malnutrition ([place]) 03/18/2019  . Closed fracture of proximal end of right humerus 01/24/2019  . Closed fracture of multiple ribs of right side with routine healing 01/15/2019  . Subarachnoid hemorrhage following injury, no loss of consciousness ([place])   . Symptomatic anemia   . Thrombocytopenia ([place])   .  Chemotherapy-induced neutropenia ([place])   . SAH (subarachnoid hemorrhage) ([place]) 01/10/2019  . Drug-induced neutropenia ([place]) 12/27/2018  . Acute myeloid leukemia not having achieved remission ([place]) 11/25/2018  . Dehydration 11/25/2018  . Tumor lysis syndrome 11/25/2018  . MDS/MPN (myelodysplastic/myeloproliferative neoplasms) ([place]) 08/28/2018  . Goals of care, counseling/discussion 08/28/2018   . Anemia 08/02/2018  . Adult idiopathic generalized osteoporosis 02/22/2017  . MPN (myeloproliferative neoplasm) ([place]) 07/17/2016  . Low serum vitamin D 07/17/2016  . Splenomegaly 02/22/2016  . Essential hypertension 02/22/2015  . History of breast cancer 02/22/2015  . History of kidney stones 02/22/2015  . Hyperlipidemia, mixed 02/22/2015  . Vitamin D deficiency 02/22/2015  . Fibroid, uterine 12/06/2008  . Hydronephrosis 12/06/2008  . Infection, Klebsiella 12/06/2008  . Nephrolithiasis 12/06/2008   PCP:  Rusty Aus, MD Pharmacy:   [id number] [place], [place]Phone: [phone]: [address] [address]Phone: [phone]: [phone] [place]Phone: [phone]: [phone]    Social Determinants of Health (SDOH) Interventions    Readmission Risk Interventions No flowsheet data found.

## 2019-08-11 NOTE — Assessment & Plan Note (Addendum)
#  86 year old female patient with relapsed acute myeloid leukemia with monocytic differentiation-most recently on gilteritinib is currently admitted to hospital for worsening fatigue nausea increased bleeding  #Relapsed refractory acute myeloid leukemia-clinically worsening white count 90,000; hemoglobin 6.9 platelets 40s.  Patient in acute DIC-secondary to underlying underlying progressive leukemia.  #Change in mentation-Phenergan versus intracranial bleed.   #Metabolic derangement-worsening renal function/hepatic function-secondary to underlying progressive malignancy  Recommendations:   I had a long discussion the patient's husband regarding progressive refractory acute leukemia-and also worsening organ dysfunction/failure.  Patient unfortunately is not a candidate for any further therapies-given her organ dysfunction/performance status/risk of chemotherapy outweighs the benefits.  Hold chemotherapy.  Patient's husband is interested in comfort measures only.  Husband is very concerned about significant decline in her quality of life over the past many months.  Discussed that best estimated survival is in order of days-week.  Recommend hospice-discussed regarding placement choices.  As per the husband patient's daughters/family in agreement.  Discussed with [employer]; and also with Dr. Posey Pronto.  We will also inform Dr. Brooke Dare  at [facility].  # 45 minutes face-to-face with the patient's husband [patient is drowsy/unable to make any decisions] discussing the above plan of care; more than 50% of time spent on prognosis/ natural history; counseling and coordination.

## 2019-08-11 NOTE — Progress Notes (Signed)
New referral for [facility] hospice services at [place], [place] unit, received from [employer]. Writer spoke in the room with patient's husband Percell Miller to initiate education regarding hospice services, philosophy and team approach to care with understanding voiced. Plan is for discharge tomorrow via EMS with signed out of facility DNR in place. Patient information sent to referral. Will continue to follow through discharge. Thank you for the opportunity to be involved in the care of this patient and her family. Flo Shanks BSN, RN [facility] [employer] [phone]

## 2019-08-11 NOTE — Progress Notes (Signed)
Patient nauseous after decadron medication, prn zofran ineffective,  received order from on call provider Sharion Settler NP for one time dose of phenergan.

## 2019-08-11 NOTE — Progress Notes (Signed)
I met again with patient's husband.  He met earlier with Dr. Rogue Bussing and has decided to pursue comfort care only.  Husband confirms this decision with me.  We discussed discontinuation of any noncomfort medications.  Residential hospice is clinically appropriate but husband would like to take her to the skilled nursing facility at the [place].  He has been in communication with the admissions director at the facility.  I would recommend that she receive hospice care there.  Discussed with social work.

## 2019-08-12 ENCOUNTER — Inpatient Hospital Stay: Payer: Medicare Other

## 2019-08-12 ENCOUNTER — Inpatient Hospital Stay: Payer: Medicare Other | Admitting: Internal Medicine

## 2019-08-12 ENCOUNTER — Encounter
Admission: RE | Admit: 2019-08-12 | Discharge: 2019-08-12 | Disposition: A | Discharge status: Home / Self Care | Payer: Medicare Other | Source: Ambulatory Visit | Attending: Internal Medicine | Admitting: Internal Medicine

## 2019-08-12 DIAGNOSIS — D696 Thrombocytopenia, unspecified: Secondary | ICD-10-CM

## 2019-08-12 DIAGNOSIS — G9341 Metabolic encephalopathy: Secondary | ICD-10-CM

## 2019-08-12 DIAGNOSIS — E790 Hyperuricemia without signs of inflammatory arthritis and tophaceous disease: Secondary | ICD-10-CM

## 2019-08-12 LAB — TYPE AND SCREEN
ABO/RH(D): O POS
Antibody Screen: NEGATIVE
Unit division: 0

## 2019-08-12 LAB — BPAM RBC
Blood Product Expiration Date: 202103162359
ISSUE DATE / TIME: 202102271157
Unit Type and Rh: 5100

## 2019-08-12 LAB — SARS CORONAVIRUS 2 (TAT 6-24 HRS): SARS Coronavirus 2: NEGATIVE

## 2019-08-12 MED ORDER — MORPHINE SULFATE (CONCENTRATE) 10 MG/0.5ML PO SOLN: 10.0000 mg | ORAL | 0 refills | Status: AC | PRN

## 2019-08-12 MED ORDER — LORAZEPAM 2 MG/ML PO CONC: 0.5000 mg | ORAL | Status: DC | PRN

## 2019-08-12 MED ORDER — LORAZEPAM 2 MG/ML PO CONC: 0.2000 mg | ORAL | 0 refills | Status: AC | PRN

## 2019-08-15 ENCOUNTER — Ambulatory Visit: Payer: Medicare Other

## 2019-08-15 ENCOUNTER — Other Ambulatory Visit: Payer: Medicare Other

## 2019-08-19 ENCOUNTER — Encounter: Payer: Medicare Other | Admitting: Hospice and Palliative Medicine

## 2019-08-28 ENCOUNTER — Other Ambulatory Visit: Payer: Self-pay | Admitting: Internal Medicine

## 2019-09-11 NOTE — Progress Notes (Signed)
Follow up visit made to new referral for [facility] hospice services at the [place], [place] unit. Writer spoke with patient's husband to inform him that a hospice order was in place and that the [facility] admissions nurse would be at the facility between 12-12:30 pm to meet with he and his wife. Hospice contact information given to Mr. Berzins. TOC Becky Dupree updated. Discharge summary faxed to referral. Thank you. Flo Shanks BSN, RN, [place] [phone]

## 2019-09-11 NOTE — Discharge Summary (Signed)
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 Anemia   . Arthritis   . Breast cancer ([place]) 2003   RT LUMPECTOMY  . Edema leg   . History of breast cancer 2003   post lumpectomy  . History of kidney stones   . Hyperlipemia, mixed   . Hypertension, essential   . Leukemia ([place])   . Leukocytosis   . MDS (myelodysplastic syndrome) ([place])   . Personal history of radiation therapy 2003   BREAST CA  . Renal stones   . Vitamin D deficiency     HOSPITAL COURSE:   Kathryn Delgiornois a 86 y.o.femalewith medical history significant ofrelapsed acute myeloid leukemia, hypertension, hyperlipidemia, breast cancer, anemia, thrombocytopenia, who is directly admitted from cancer center per Dr. Rogue Bussing due to worsening liver function and renal function.   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 Repeat COVID negative Pt is DNR D/c to [facility] place today with Hospice services  Procedures:none Family communication  Updated by oncology Consults :Oncology, nephrology Discharge Disposition :[facility] place with Hospice CODE STATUS: Dnr prior to admission DVT Prophylaxis :SCD due to severe TCP Barriers to discharge: none CONSULTS OBTAINED:  Treatment Team:  Cammie Sickle, MD  DRUG ALLERGIES:   Allergies  Allergen Reactions  . Terbinafine Rash and Swelling    DISCHARGE MEDICATIONS:   Allergies as of 08/13/2019      Reactions   Terbinafine Rash, Swelling      Medication List    STOP taking these medications   acyclovir 400 MG tablet Commonly known as: ZOVIRAX   atenolol 50 MG tablet Commonly known as: TENORMIN   calcium carbonate 1500 (600 Ca) MG Tabs tablet Commonly known as: OSCAL   [place] Crea   heparin lock flush 100 UNIT/ML Soln injection   Klor-Con M20 20 MEQ tablet Generic drug: potassium chloride SA   losartan-hydrochlorothiazide 50-12.5 MG tablet Commonly known as: HYZAAR   magnesium hydroxide 400 MG/5ML suspension Commonly known as: MILK OF MAGNESIA   ondansetron 8 MG tablet Commonly known as: ZOFRAN   polyethylene glycol 17 g packet Commonly known as: MIRALAX / GLYCOLAX   posaconazole 100 MG Tbec delayed-release tablet Commonly known as: NOXAFIL   senna 8.6 MG tablet Commonly known as: SENOKOT   Vitamin D 50 MCG (2000 UT) tablet     TAKE these medications   acetaminophen 325 MG tablet Commonly known as: TYLENOL Take 325 mg by mouth every 6 (six) hours as needed for mild pain or moderate  pain.   LORazepam 2 MG/ML concentrated solution Commonly known as: ATIVAN Take 0.1 mLs (0.2 mg total) by mouth every 3 (three) hours as needed for anxiety.   magic mouthwash w/lidocaine Soln Take 5 mLs by mouth 4 (four) times daily  as needed for mouth pain.   morphine CONCENTRATE 10 MG/0.5ML Soln concentrated solution Take 0.5 mLs (10 mg total) by mouth every 2 (two) hours as needed for severe pain, anxiety or shortness of breath.       If you experience worsening of your admission symptoms, develop shortness of breath, life threatening emergency, suicidal or homicidal thoughts you must seek medical attention immediately by calling 911 or calling your MD immediately  if symptoms less severe.  You Must read complete instructions/literature along with all the possible adverse reactions/side effects for all the Medicines you take and that have been prescribed to you. Take any new Medicines after you have completely understood and accept all the possible adverse reactions/side effects.   Please note  You were cared for by a hospitalist during your hospital stay. If you have any questions about your discharge medications or the care you received while you were in the hospital after you are discharged, you can call the unit and asked to speak with the hospitalist on call if the hospitalist that took care of you is not available. Once you are discharged, your primary care physician will handle any further medical issues. Please note that NO REFILLS for any discharge medications will be authorized once you are discharged, as it is imperative that you return to your primary care physician (or establish a relationship with a primary care physician if you do not have one) for your aftercare needs so that they can reassess your need for medications and monitor your lab values. Today   SUBJECTIVE   Resting quietly  VITAL SIGNS:  Blood pressure 140/76, pulse 92, temperature 98.3 F (36.8 C), temperature source Oral, resp. rate 16, height '4\' 11"'$  (1.499 m), weight 47 kg, SpO2 98 %.  I/O:    Intake/Output Summary (Last 24 hours) at 08/17/2019 0848 Last data filed at 08/11/2019 2216 Gross per 24 hour  Intake 1818.9 ml  Output 500 ml   Net 1318.9 ml    PHYSICAL EXAMINATION:  GENERAL:  86 y.o.-year-old patient lying in the bed with no acute distress. Weak Pallor+ overall appears very  ill HEENT: Head atraumatic, normocephalic. Oropharynx and nasopharynx  Old crusted blood LUNGS: Normal breath sounds bilaterally, no wheezing, rales, rhonchi. No use of accessory muscles of respiration.  CARDIOVASCULAR: S1, S2 normal. No murmurs, rubs, or gallops.   EXTREMITIES: No cyanosis, clubbing  ++ edema b/l.    SKIN petechia over the skin, generalized pallor ++  DATA REVIEW:   CBC  Recent Labs  Lab 08/11/19 1021  WBC 93.4*  HGB 6.9*  HCT 21.0*  PLT 67*    Chemistries  Recent Labs  Lab 08/10/19 0457 08/10/19 0457 08/11/19 0318  NA 138   < > 142  K 4.0   < > 4.0  CL 108   < > 108  CO2 17*   < > 24  GLUCOSE 150*   < > 135*  BUN 64*   < > 73*  CREATININE 1.59*   < > 1.68*  CALCIUM 8.1*   < > 7.7*  MG 1.9  --   --   AST 356*   < > 423*  ALT 50*   < > 50*  ALKPHOS 133*   < >  134*  BILITOT 0.6   < > 0.7   < > = values in this interval not displayed.    Microbiology Results   Recent Results (from the past 240 hour(s))  SARS CORONAVIRUS 2 (TAT 6-24 HRS) Nasopharyngeal Nasopharyngeal Swab     Status: None   Collection Time: 08/08/19  1:20 PM   Specimen: Nasopharyngeal Swab  Result Value Ref Range Status   SARS Coronavirus 2 NEGATIVE NEGATIVE Final    Comment: (NOTE) SARS-CoV-2 target nucleic acids are NOT DETECTED. The SARS-CoV-2 RNA is generally detectable in upper and lower respiratory specimens during the acute phase of infection. Negative results do not preclude SARS-CoV-2 infection, do not rule out co-infections with other pathogens, and should not be used as the sole basis for treatment or other patient management decisions. Negative results must be combined with clinical observations, patient history, and epidemiological information. The expected result is Negative. Fact Sheet for  Patients: SugarRoll.be Fact Sheet for Healthcare Providers: https://www.woods-mathews.com/ This test is not yet approved or cleared by the [place] FDA and  has been authorized for detection and/or diagnosis of SARS-CoV-2 by FDA under an Emergency Use Authorization (EUA). This EUA will remain  in effect (meaning this test can be used) for the duration of the COVID-19 declaration under Section 56 4(b)(1) of the Act, 21 U.S.C. section 360bbb-3(b)(1), unless the authorization is terminated or revoked sooner. Performed at [facility], [place] [address], [place], [place] [address]   SARS CORONAVIRUS 2 (TAT 6-24 HRS) Nasopharyngeal Nasopharyngeal Swab     Status: None   Collection Time: 08/11/19  3:08 PM   Specimen: Nasopharyngeal Swab  Result Value Ref Range Status   SARS Coronavirus 2 NEGATIVE NEGATIVE Final    Comment: (NOTE) SARS-CoV-2 target nucleic acids are NOT DETECTED. The SARS-CoV-2 RNA is generally detectable in upper and lower respiratory specimens during the acute phase of infection. Negative results do not preclude SARS-CoV-2 infection, do not rule out co-infections with other pathogens, and should not be used as the sole basis for treatment or other patient management decisions. Negative results must be combined with clinical observations, patient history, and epidemiological information. The expected result is Negative. Fact Sheet for Patients: SugarRoll.be Fact Sheet for Healthcare Providers: https://www.woods-mathews.com/ This test is not yet approved or cleared by the [place] FDA and  has been authorized for detection and/or diagnosis of SARS-CoV-2 by FDA under an Emergency Use Authorization (EUA). This EUA will remain  in effect (meaning this test can be used) for the duration of the COVID-19 declaration under Section 56 4(b)(1) of the Act, 21 U.S.C. section  360bbb-3(b)(1), unless the authorization is terminated or revoked sooner. Performed at [facility], [place] [address] [place],  [address]     RADIOLOGY:  No results found.   CODE STATUS:     Code Status Orders  (From admission, onward)         Start     Ordered   08/08/19 1245  Do not attempt resuscitation (DNR)  Continuous    Question Answer Comment  In the event of cardiac or respiratory ARREST Do not call a "code blue"   In the event of cardiac or respiratory ARREST Do not perform Intubation, CPR, defibrillation or ACLS   In the event of cardiac or respiratory ARREST Use medication by any route, position, wound care, and other measures to relive pain and suffering. May use oxygen, suction and manual treatment of airway obstruction as needed for comfort.  08/08/19 1245        Code Status History    Date Active Date Inactive Code Status Order ID Comments User Context   01/10/2019 1937 01/14/2019 1651 Full Code [id number]  Dustin Flock, MD Inpatient   11/25/2018 1202 12/02/2018 1721 Full Code [id number]  Saundra Shelling, MD Inpatient   Advance Care Planning Activity    Advance Directive Documentation     Most Recent Value  Type of Advance Directive  Living will, Healthcare Power of Attorney  Pre-existing out of facility DNR order (yellow form or pink MOST form)  --  "MOST" Form in Place?  --       TOTAL TIME TAKING CARE OF THIS PATIENT: **40* minutes.    Fritzi Mandes M.D  [facility]    CC: Primary care physician; Rusty Aus, MD

## 2019-09-11 NOTE — Progress Notes (Signed)
Husband at pt bedside and expressing concerns about transporting patient at end of life. RN asked if husband was having second thoughts regarding DC plan. RN asked if husband of patient would like to speak with provider again before transportation. MD spoke to husband and husband agreeable to POC and transfer to facility.

## 2019-09-11 NOTE — Progress Notes (Signed)
RN to RN report called to [employer] at [employer]. Report given to [place], Therapist, sports. RN instructed to called back if any more questions.

## 2019-09-11 NOTE — TOC Transition Note (Signed)
Transition of Care [facility]) - CM/SW Discharge Note   Patient Details  Name: Notie Mccleery MRN: [id number] Date of Birth: Nov 08, 1933  Transition of [facility]) CM/SW Contact:  Elease Hashimoto, LCSW Phone Number: 09/08/2019, 9:07 AM   Clinical Narrative:   Pt set to go to [facility] within [place] with hospice follow. Family set with this plan. Pt to go EMS when discharge paperwork completed and bedside RN to call report to [phone] room 355. Will await husband to arrive and work on transferring pt to facility with hospice follow. All in agreement with this plan.    Final next level of care: Skilled Nursing Facility Barriers to Discharge: Barriers Resolved   Patient Goals and CMS Choice Patient states their goals for this hospitalization and ongoing recovery are:: Husband aswers for wife. We want her to be at peace and comfortable      Discharge Placement   Existing PASRR number confirmed : 08/11/19          Patient chooses bed at: [facility] Patient to be transferred to facility by: EMS Name of family member notified: Husband Patient and family notified of of transfer: 08/16/2019  Discharge Plan and Services In-house Referral: Clinical Social Work   Post Acute Care Choice: Hospice                               Social Determinants of Health (SDOH) Interventions     Readmission Risk Interventions No flowsheet data found.

## 2019-09-11 NOTE — Progress Notes (Signed)
Pt had lab draws for uric acid, family refused when I explained to them. They said, "let it be, no more blood draws." Oral care done frequently, at one point pt stretched her tongue out to be cleaned. repositioned and Pt was able to speak some few words," stop it, I want to use the BR". Pt managed to take sips of water and ginger ale, but no significant PO intake.

## 2019-09-11 DEATH — deceased

## 2021-02-05 IMAGING — CR RIGHT SHOULDER - 2+ VIEW
1 series · 4 of 4 positions shown · non-contrast
Comparison: None.

CLINICAL DATA: Acute RIGHT shoulder pain following fall. Initial
encounter.

EXAM:
RIGHT SHOULDER - 2+ VIEW

[Series 1: dg shoulder right · 0.14mm/px · 4 of 4 slices shown]
[im 1/4]
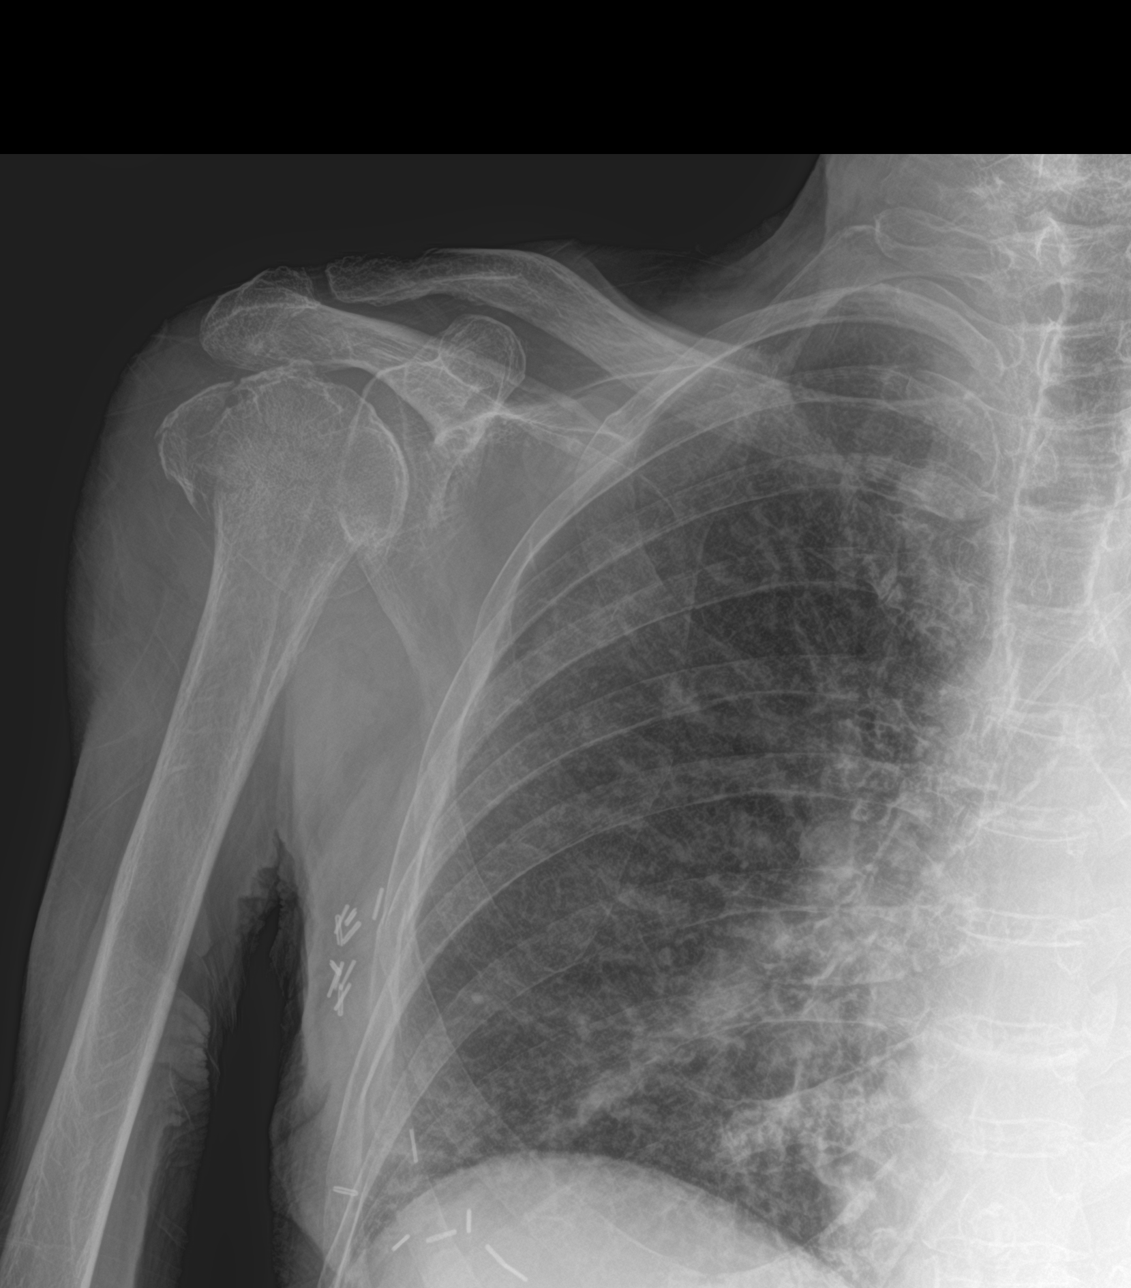
[im 2/4]
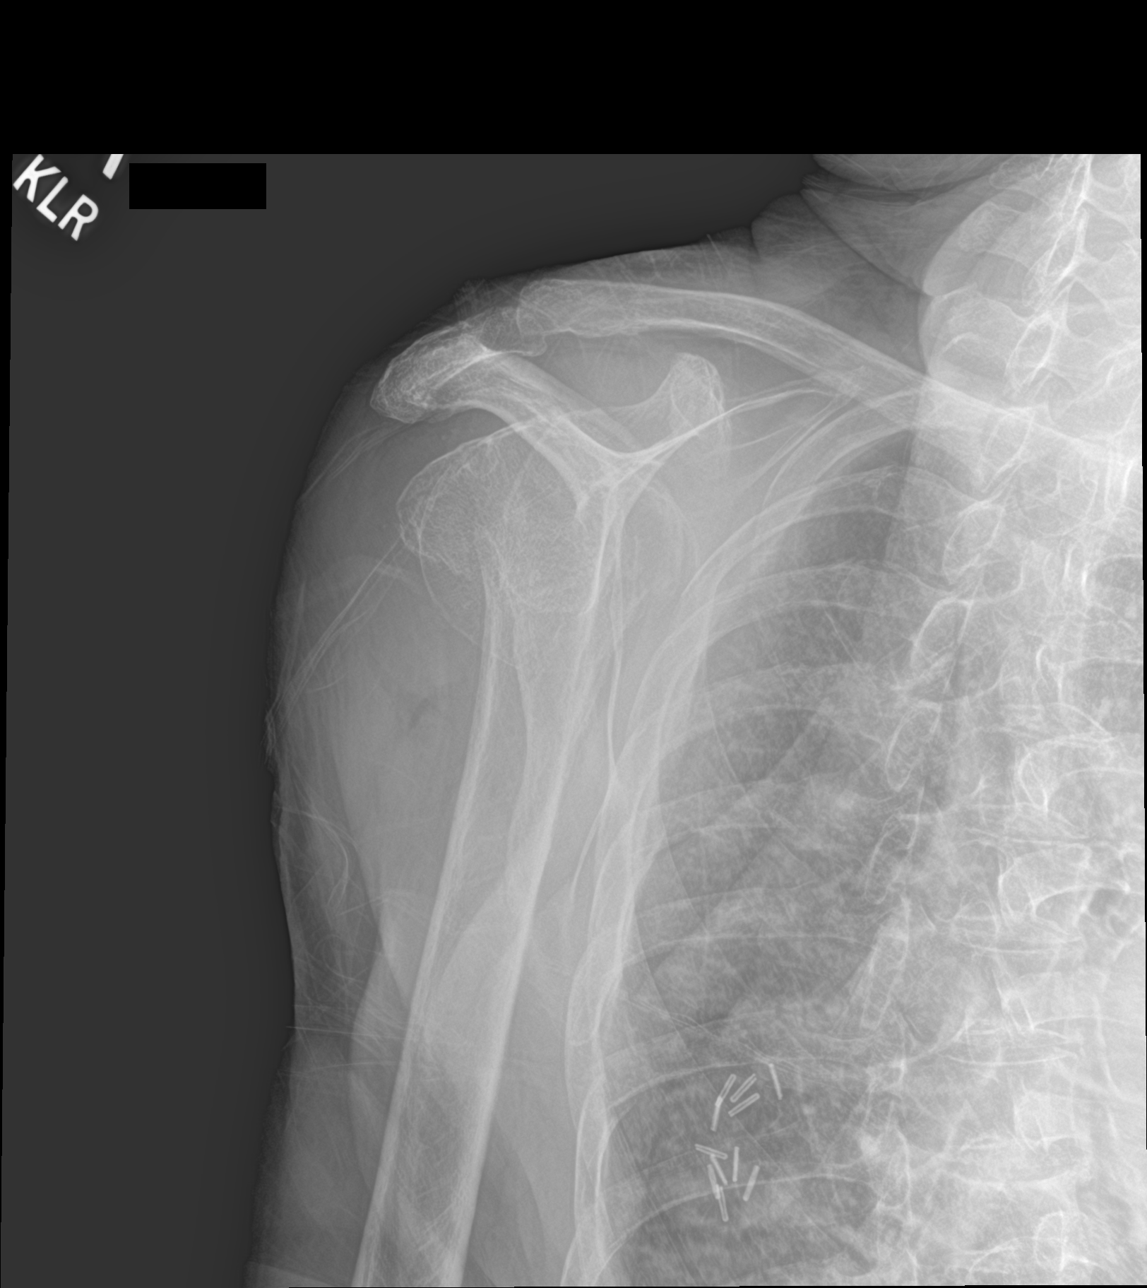
[im 3/4]
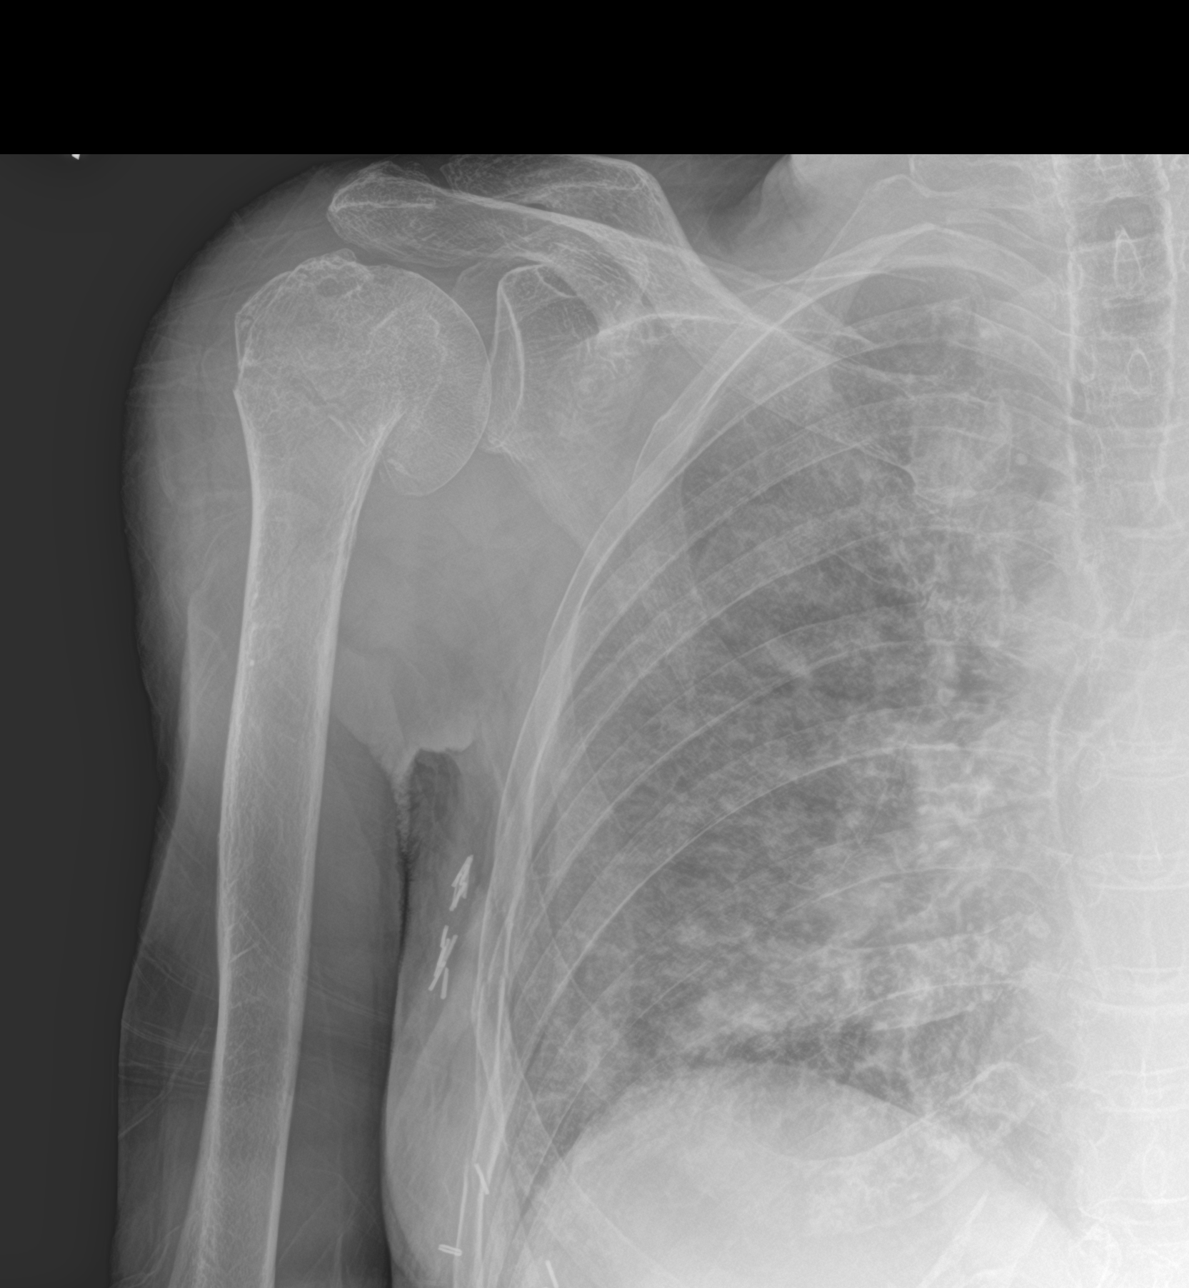
[im 4/4]
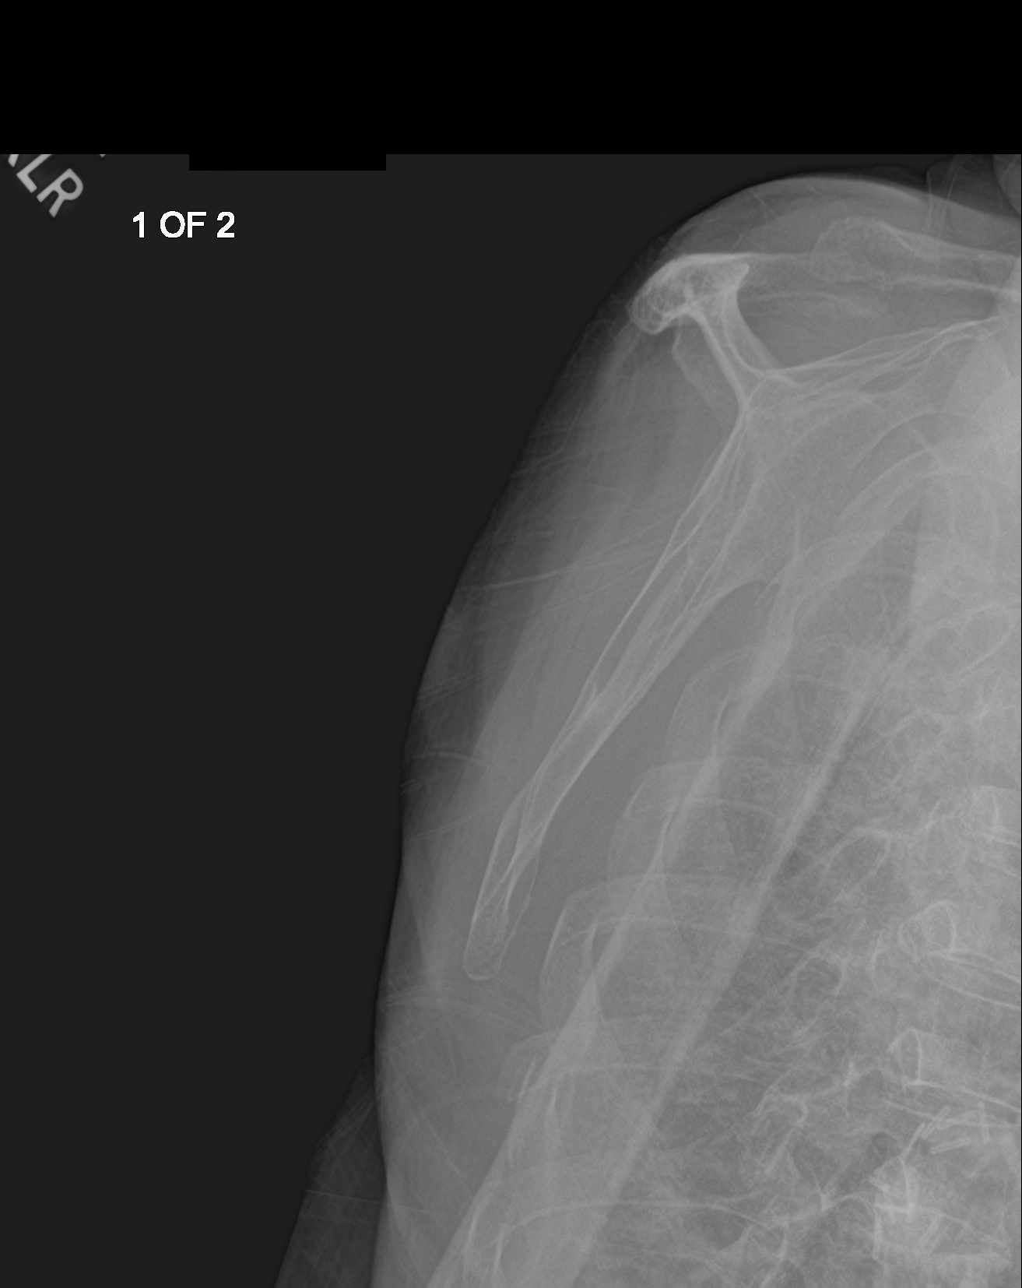

[4 of 4 positions shown; findings below may reference images not displayed]

FINDINGS: A mildly impacted humeral neck fracture is noted extending into the
head and involving the greater tuberosity.

No shoulder dislocation.

Fractures of the RIGHT 5th through 8th ribs noted.  No pneumothorax.
IMPRESSION: 1. Mildly impacted humeral neck fracture extending into the head and
greater tuberosity.
2. RIGHT 5th through 8th rib fractures.  No pneumothorax.

## 2021-02-05 IMAGING — CT CT HEAD WITHOUT CONTRAST
4 of 7 series · 14 of 47 positions shown, 15 images · non-contrast
Comparison: CTA head 01/10/2017. CT face 01/09/2017.
COMPARISON: CTA head 01/10/2017. CT face 01/09/2017.

Addendum:
CLINICAL DATA: 84-year-old female status post fall this morning.
Right orbits/frontal injury. Neck pain.

EXAM:
CT HEAD WITHOUT CONTRAST
CT CERVICAL SPINE WITHOUT CONTRAST
TECHNIQUE: Multidetector CT imaging of the head and cervical spine was
performed following the standard protocol without intravenous
contrast. Multiplanar CT image reconstructions of the cervical spine
were also generated.

[Series 2: head wo · axial · 0.40mm/px · z∈[-48,+22]mm · 3 of 29 slices shown, 4 images]
[im 8/29  brain]
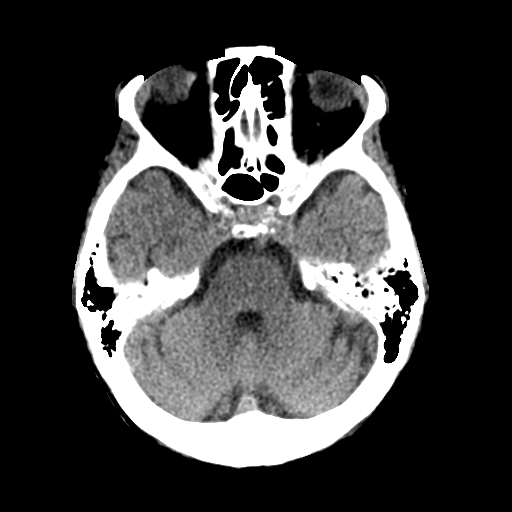
[im 8/29  bone]
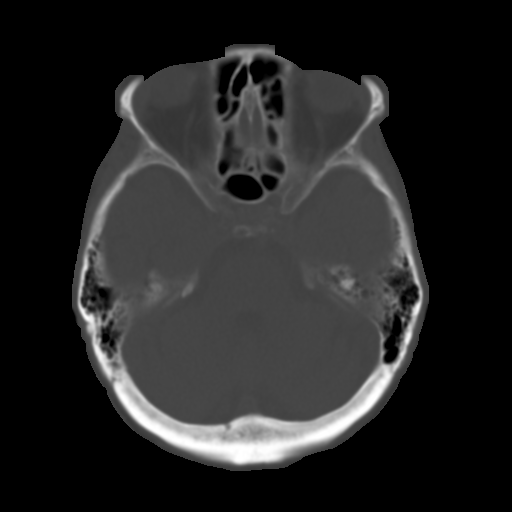
[im 15/29  brain]
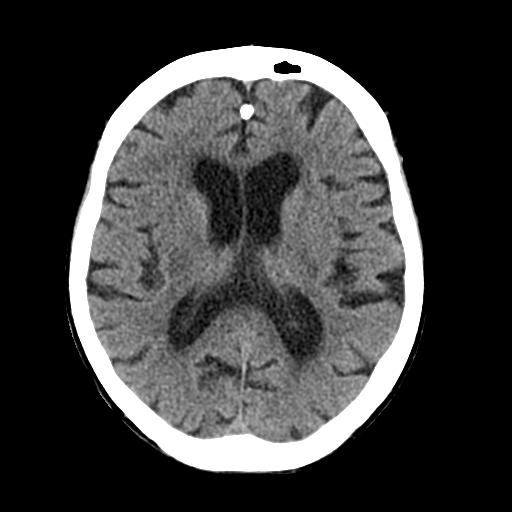
[im 22/29  brain]
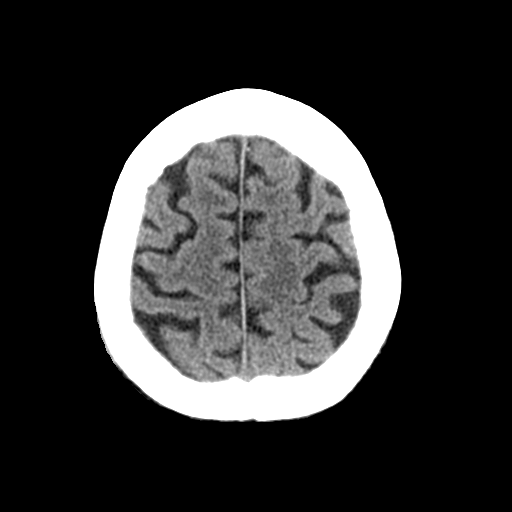

[Series 4: coronal soft tissue · coronal · 0.29mm/px · 3 of 63 slices shown]
[im 10/63  brain]
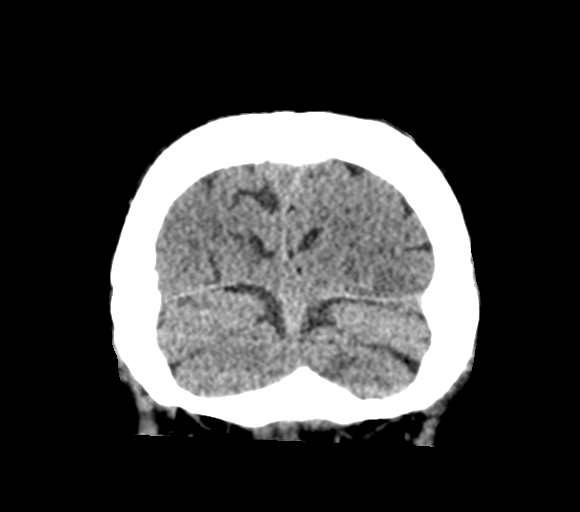
[im 14/63  brain]
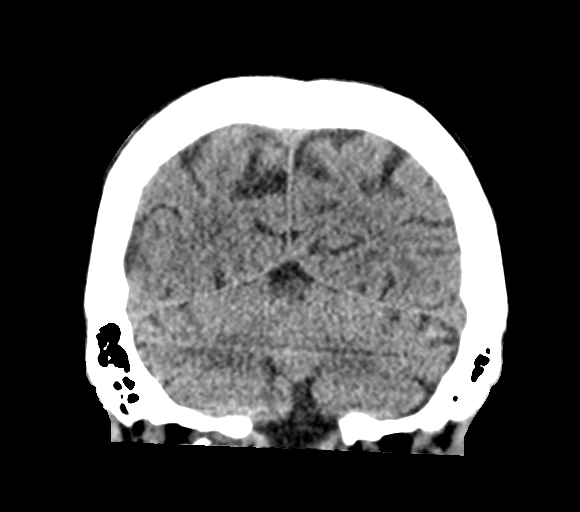
[im 19/63  brain]
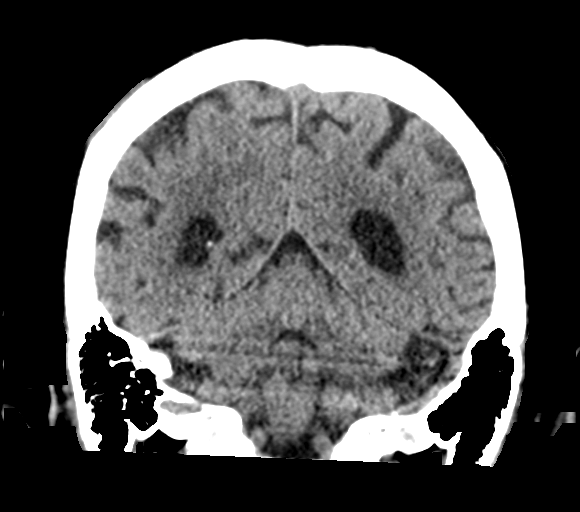

[Series 5: sagittal soft tissue · sagittal · 0.29mm/px · 1 of 52 slices shown]
[im 26/52  brain]
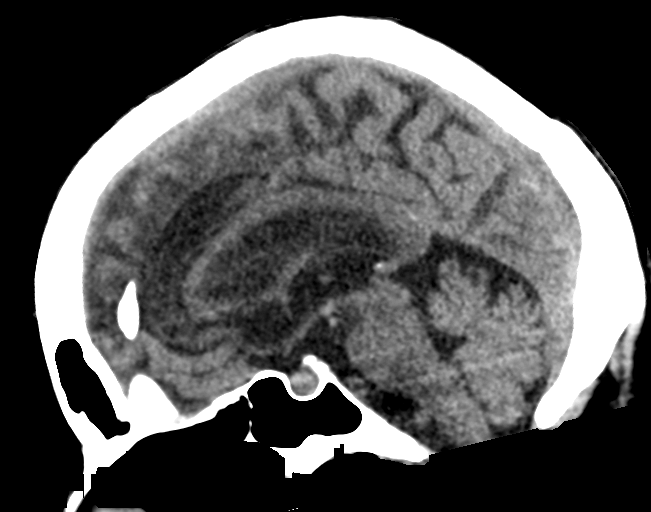

[Series 10: orthogonal bone · axial · 0.22mm/px · z∈[-233,-133]mm · 7 of 83 slices shown]
[im 7/83  bone]
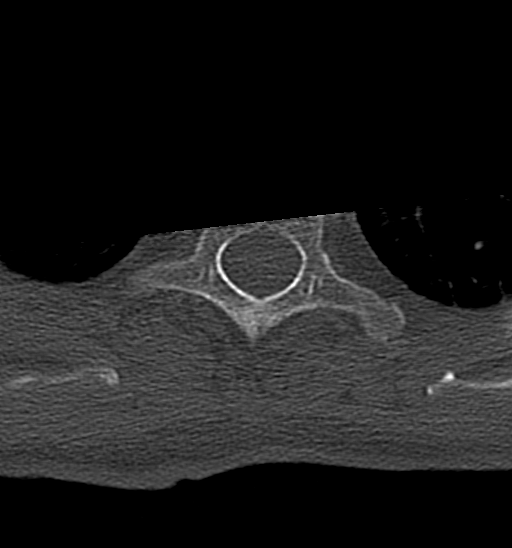
[im 19/83  bone]
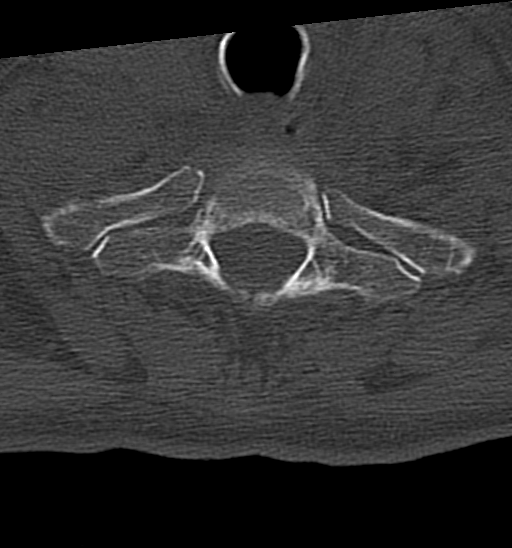
[im 26/83  bone]
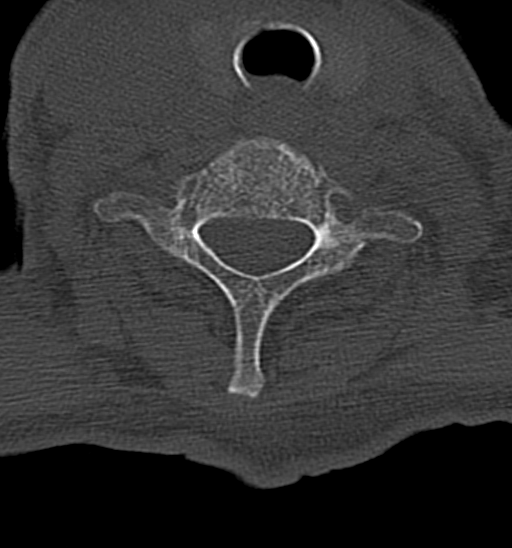
[im 38/83  bone]
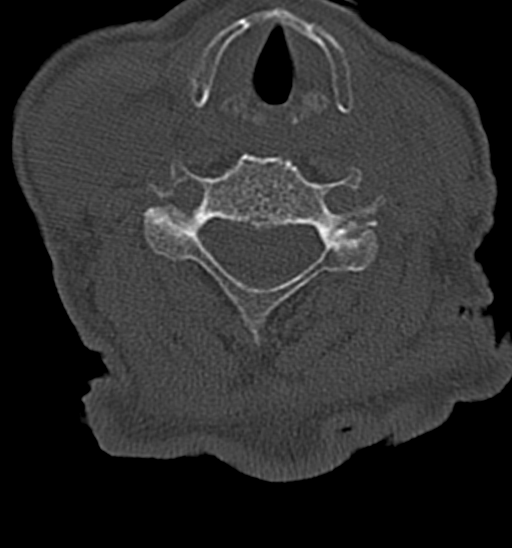
[im 45/83  bone]
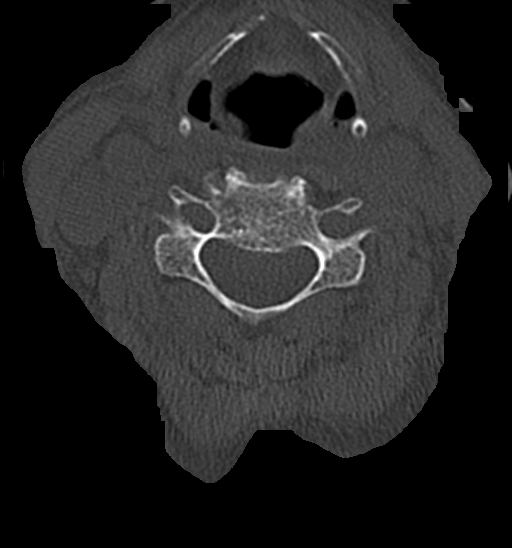
[im 57/83  bone]
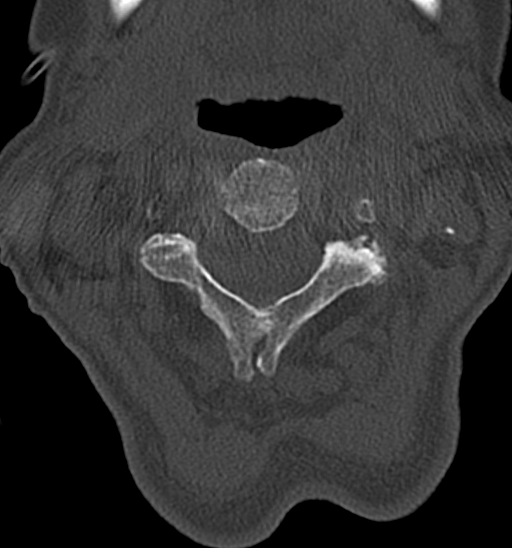
[im 64/83  bone]
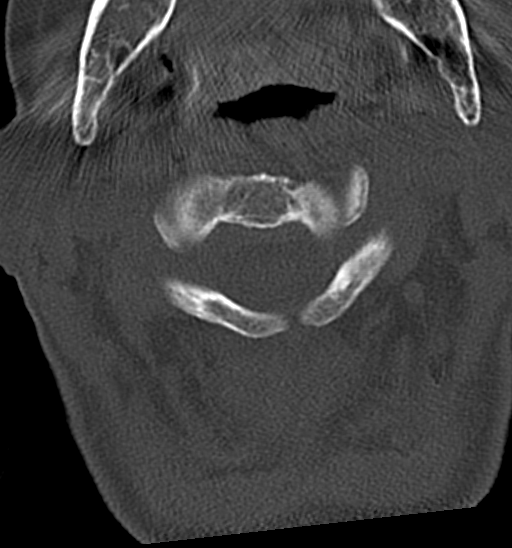

[14 of 47 positions shown; findings below may reference images not displayed]

FINDINGS: CT HEAD FINDINGS

Brain: Stable cerebral volume, normal for age. No midline shift,
mass effect, or evidence of intracranial mass lesion. No
ventriculomegaly. No intraventricular hemorrhage identified.

Trace acute hemorrhage along the right anterior frontal convexity on
series 2, image 13 appears to be subarachnoid rather than a
hemorrhagic cerebral contusion. No other acute intracranial
hemorrhage identified.

Stable gray-white matter differentiation throughout the brain. No
cortically based acute infarct identified. No cortical
encephalomalacia. Normal basilar cisterns.

Vascular: Calcified atherosclerosis at the skull base. No suspicious
intracranial vascular hyperdensity.

Skull: Intact.

Sinuses/Orbits: Trace to mild paranasal sinus mucosal thickening is
new since 2689. Tympanic cavities and mastoids remain clear.

Other: Right posterior convexity scalp hematoma measuring up to 4
millimeters in thickness. Underlying calvarium intact.

Stable and negative other orbit and scalp soft tissues.

CT CERVICAL SPINE FINDINGS

Alignment: Reversal of upper cervical lordosis is chronic and
appears stable since 2689. Bilateral posterior element alignment is
within normal limits. Cervicothoracic junction alignment is within
normal limits.

Skull base and vertebrae: Visualized skull base is intact. No
atlanto-occipital dissociation. Congenital incomplete ossification
of the posterior C1 ring. No acute osseous abnormality identified.

Soft tissues and spinal canal: No prevertebral fluid or swelling. No
visible canal hematoma.

Disc levels: Widespread advanced cervical spine disc and endplate
degeneration. Progressed left facet degeneration at C2-C3 since
2689. Mild if any associated cervical spinal stenosis.

Upper chest: Grossly intact visible upper thoracic levels. Negative
lung apices.
IMPRESSION: 1. Positive for trace posttraumatic hemorrhage along the right
anterior frontal lobe, favor trace subarachnoid over small
hemorrhagic contusion.
2. No intracranial mass effect and no other acute traumatic injury
to the brain identified.
3. Right posterior convexity scalp hematoma without underlying
fracture.
4. No acute traumatic injury identified in the cervical spine.

ADDENDUM:
Study discussed by telephone with Dr. MIRELYS GOVIND on 01/10/2019 at
3188 hours.

*** End of Addendum ***
FINDINGS: CT HEAD FINDINGS

Brain: Stable cerebral volume, normal for age. No midline shift,
mass effect, or evidence of intracranial mass lesion. No
ventriculomegaly. No intraventricular hemorrhage identified.

Trace acute hemorrhage along the right anterior frontal convexity on
series 2, image 13 appears to be subarachnoid rather than a
hemorrhagic cerebral contusion. No other acute intracranial
hemorrhage identified.

Stable gray-white matter differentiation throughout the brain. No
cortically based acute infarct identified. No cortical
encephalomalacia. Normal basilar cisterns.

Vascular: Calcified atherosclerosis at the skull base. No suspicious
intracranial vascular hyperdensity.

Skull: Intact.

Sinuses/Orbits: Trace to mild paranasal sinus mucosal thickening is
new since 2689. Tympanic cavities and mastoids remain clear.

Other: Right posterior convexity scalp hematoma measuring up to 4
millimeters in thickness. Underlying calvarium intact.

Stable and negative other orbit and scalp soft tissues.

CT CERVICAL SPINE FINDINGS

Alignment: Reversal of upper cervical lordosis is chronic and
appears stable since 2689. Bilateral posterior element alignment is
within normal limits. Cervicothoracic junction alignment is within
normal limits.

Skull base and vertebrae: Visualized skull base is intact. No
atlanto-occipital dissociation. Congenital incomplete ossification
of the posterior C1 ring. No acute osseous abnormality identified.

Soft tissues and spinal canal: No prevertebral fluid or swelling. No
visible canal hematoma.

Disc levels: Widespread advanced cervical spine disc and endplate
degeneration. Progressed left facet degeneration at C2-C3 since
2689. Mild if any associated cervical spinal stenosis.

Upper chest: Grossly intact visible upper thoracic levels. Negative
lung apices.
IMPRESSION: 1. Positive for trace posttraumatic hemorrhage along the right
anterior frontal lobe, favor trace subarachnoid over small
hemorrhagic contusion.
2. No intracranial mass effect and no other acute traumatic injury
to the brain identified.
3. Right posterior convexity scalp hematoma without underlying
fracture.
4. No acute traumatic injury identified in the cervical spine.
